# Patient Record
Sex: Male | Born: 1935 | Race: White | Hispanic: No
Health system: Southern US, Community
[De-identification: ages and names within clinical notes are randomized; demographics above are authoritative.]

## PROBLEM LIST (undated history)

## (undated) DIAGNOSIS — K219 Gastro-esophageal reflux disease without esophagitis: Secondary | ICD-10-CM

## (undated) DIAGNOSIS — E7800 Pure hypercholesterolemia, unspecified: Secondary | ICD-10-CM

## (undated) DIAGNOSIS — E039 Hypothyroidism, unspecified: Secondary | ICD-10-CM

## (undated) DIAGNOSIS — T4145XA Adverse effect of unspecified anesthetic, initial encounter: Secondary | ICD-10-CM

## (undated) DIAGNOSIS — G459 Transient cerebral ischemic attack, unspecified: Secondary | ICD-10-CM

## (undated) DIAGNOSIS — N400 Enlarged prostate without lower urinary tract symptoms: Secondary | ICD-10-CM

## (undated) DIAGNOSIS — N289 Disorder of kidney and ureter, unspecified: Secondary | ICD-10-CM

## (undated) DIAGNOSIS — T8859XA Other complications of anesthesia, initial encounter: Secondary | ICD-10-CM

## (undated) DIAGNOSIS — I10 Essential (primary) hypertension: Secondary | ICD-10-CM

## (undated) DIAGNOSIS — F329 Major depressive disorder, single episode, unspecified: Secondary | ICD-10-CM

## (undated) DIAGNOSIS — F0390 Unspecified dementia without behavioral disturbance: Secondary | ICD-10-CM

## (undated) DIAGNOSIS — M1990 Unspecified osteoarthritis, unspecified site: Secondary | ICD-10-CM

## (undated) DIAGNOSIS — F32A Depression, unspecified: Secondary | ICD-10-CM

## (undated) HISTORY — PX: ROTATOR CUFF REPAIR: SHX139

## (undated) HISTORY — PX: COLECTOMY: SHX59

## (undated) HISTORY — PX: ABDOMINAL EXPLORATION SURGERY: SHX538

## (undated) HISTORY — PX: APPENDECTOMY: SHX54

## (undated) HISTORY — PX: FEMUR FRACTURE SURGERY: SHX633

## (undated) HISTORY — PX: TONSILLECTOMY: SUR1361

## (undated) HISTORY — PX: FRACTURE SURGERY: SHX138

## (undated) HISTORY — PX: KNEE ARTHROSCOPY: SHX127

## (undated) HISTORY — PX: POSTERIOR LUMBAR FUSION: SHX6036

## (undated) HISTORY — PX: CATARACT EXTRACTION W/ INTRAOCULAR LENS  IMPLANT, BILATERAL: SHX1307

---

## 2004-05-23 DIAGNOSIS — IMO0002 Reserved for concepts with insufficient information to code with codable children: Secondary | ICD-10-CM

## 2008-07-10 DIAGNOSIS — M5137 Other intervertebral disc degeneration, lumbosacral region: Secondary | ICD-10-CM

## 2016-03-19 DIAGNOSIS — N183 Chronic kidney disease, stage 3 unspecified: Secondary | ICD-10-CM | POA: Insufficient documentation

## 2016-03-19 DIAGNOSIS — E785 Hyperlipidemia, unspecified: Secondary | ICD-10-CM | POA: Insufficient documentation

## 2017-06-08 DIAGNOSIS — E039 Hypothyroidism, unspecified: Secondary | ICD-10-CM | POA: Insufficient documentation

## 2017-10-15 ENCOUNTER — Encounter (HOSPITAL_COMMUNITY): Payer: Self-pay | Admitting: *Deleted

## 2017-10-15 ENCOUNTER — Emergency Department (HOSPITAL_COMMUNITY): Payer: Medicare Other

## 2017-10-15 ENCOUNTER — Inpatient Hospital Stay (HOSPITAL_COMMUNITY)
Admission: EM | Admit: 2017-10-15 | Discharge: 2017-10-22 | DRG: 368 | Disposition: A | Discharge status: Home Health | Payer: Medicare Other | Attending: Internal Medicine | Admitting: Internal Medicine

## 2017-10-15 DIAGNOSIS — J9690 Respiratory failure, unspecified, unspecified whether with hypoxia or hypercapnia: Secondary | ICD-10-CM

## 2017-10-15 DIAGNOSIS — K226 Gastro-esophageal laceration-hemorrhage syndrome: Secondary | ICD-10-CM | POA: Diagnosis not present

## 2017-10-15 DIAGNOSIS — Z9841 Cataract extraction status, right eye: Secondary | ICD-10-CM

## 2017-10-15 DIAGNOSIS — J9600 Acute respiratory failure, unspecified whether with hypoxia or hypercapnia: Secondary | ICD-10-CM | POA: Diagnosis present

## 2017-10-15 DIAGNOSIS — K317 Polyp of stomach and duodenum: Secondary | ICD-10-CM | POA: Diagnosis present

## 2017-10-15 DIAGNOSIS — Z9842 Cataract extraction status, left eye: Secondary | ICD-10-CM

## 2017-10-15 DIAGNOSIS — D500 Iron deficiency anemia secondary to blood loss (chronic): Secondary | ICD-10-CM | POA: Diagnosis not present

## 2017-10-15 DIAGNOSIS — N401 Enlarged prostate with lower urinary tract symptoms: Secondary | ICD-10-CM | POA: Diagnosis present

## 2017-10-15 DIAGNOSIS — K922 Gastrointestinal hemorrhage, unspecified: Secondary | ICD-10-CM

## 2017-10-15 DIAGNOSIS — R0789 Other chest pain: Secondary | ICD-10-CM | POA: Diagnosis not present

## 2017-10-15 DIAGNOSIS — K449 Diaphragmatic hernia without obstruction or gangrene: Secondary | ICD-10-CM | POA: Diagnosis present

## 2017-10-15 DIAGNOSIS — R066 Hiccough: Secondary | ICD-10-CM | POA: Diagnosis present

## 2017-10-15 DIAGNOSIS — Z888 Allergy status to other drugs, medicaments and biological substances status: Secondary | ICD-10-CM

## 2017-10-15 DIAGNOSIS — Z7982 Long term (current) use of aspirin: Secondary | ICD-10-CM

## 2017-10-15 DIAGNOSIS — K222 Esophageal obstruction: Secondary | ICD-10-CM | POA: Diagnosis present

## 2017-10-15 DIAGNOSIS — F0390 Unspecified dementia without behavioral disturbance: Secondary | ICD-10-CM | POA: Diagnosis present

## 2017-10-15 DIAGNOSIS — K219 Gastro-esophageal reflux disease without esophagitis: Secondary | ICD-10-CM | POA: Diagnosis present

## 2017-10-15 DIAGNOSIS — Z79899 Other long term (current) drug therapy: Secondary | ICD-10-CM

## 2017-10-15 DIAGNOSIS — Z01818 Encounter for other preprocedural examination: Secondary | ICD-10-CM

## 2017-10-15 DIAGNOSIS — K2211 Ulcer of esophagus with bleeding: Secondary | ICD-10-CM | POA: Diagnosis present

## 2017-10-15 DIAGNOSIS — R739 Hyperglycemia, unspecified: Secondary | ICD-10-CM | POA: Diagnosis present

## 2017-10-15 DIAGNOSIS — Z8673 Personal history of transient ischemic attack (TIA), and cerebral infarction without residual deficits: Secondary | ICD-10-CM

## 2017-10-15 DIAGNOSIS — Z87891 Personal history of nicotine dependence: Secondary | ICD-10-CM

## 2017-10-15 DIAGNOSIS — R338 Other retention of urine: Secondary | ICD-10-CM | POA: Diagnosis present

## 2017-10-15 DIAGNOSIS — K2210 Ulcer of esophagus without bleeding: Secondary | ICD-10-CM | POA: Diagnosis not present

## 2017-10-15 DIAGNOSIS — R32 Unspecified urinary incontinence: Secondary | ICD-10-CM | POA: Diagnosis present

## 2017-10-15 DIAGNOSIS — R1310 Dysphagia, unspecified: Secondary | ICD-10-CM

## 2017-10-15 DIAGNOSIS — I10 Essential (primary) hypertension: Secondary | ICD-10-CM | POA: Diagnosis present

## 2017-10-15 DIAGNOSIS — D72829 Elevated white blood cell count, unspecified: Secondary | ICD-10-CM | POA: Diagnosis present

## 2017-10-15 DIAGNOSIS — R001 Bradycardia, unspecified: Secondary | ICD-10-CM | POA: Diagnosis present

## 2017-10-15 DIAGNOSIS — Z961 Presence of intraocular lens: Secondary | ICD-10-CM | POA: Diagnosis present

## 2017-10-15 DIAGNOSIS — F329 Major depressive disorder, single episode, unspecified: Secondary | ICD-10-CM | POA: Diagnosis present

## 2017-10-15 DIAGNOSIS — D62 Acute posthemorrhagic anemia: Secondary | ICD-10-CM | POA: Diagnosis present

## 2017-10-15 DIAGNOSIS — E039 Hypothyroidism, unspecified: Secondary | ICD-10-CM | POA: Diagnosis present

## 2017-10-15 DIAGNOSIS — R079 Chest pain, unspecified: Secondary | ICD-10-CM

## 2017-10-15 DIAGNOSIS — Z79890 Hormone replacement therapy (postmenopausal): Secondary | ICD-10-CM

## 2017-10-15 HISTORY — DX: Other complications of anesthesia, initial encounter: T88.59XA

## 2017-10-15 HISTORY — DX: Adverse effect of unspecified anesthetic, initial encounter: T41.45XA

## 2017-10-15 HISTORY — DX: Gastro-esophageal reflux disease without esophagitis: K21.9

## 2017-10-15 HISTORY — DX: Depression, unspecified: F32.A

## 2017-10-15 HISTORY — DX: Transient cerebral ischemic attack, unspecified: G45.9

## 2017-10-15 HISTORY — DX: Disorder of kidney and ureter, unspecified: N28.9

## 2017-10-15 HISTORY — DX: Pure hypercholesterolemia, unspecified: E78.00

## 2017-10-15 HISTORY — DX: Unspecified osteoarthritis, unspecified site: M19.90

## 2017-10-15 HISTORY — DX: Benign prostatic hyperplasia without lower urinary tract symptoms: N40.0

## 2017-10-15 HISTORY — DX: Hypothyroidism, unspecified: E03.9

## 2017-10-15 HISTORY — DX: Essential (primary) hypertension: I10

## 2017-10-15 HISTORY — DX: Major depressive disorder, single episode, unspecified: F32.9

## 2017-10-15 HISTORY — DX: Unspecified dementia, unspecified severity, without behavioral disturbance, psychotic disturbance, mood disturbance, and anxiety: F03.90

## 2017-10-15 LAB — URINALYSIS, ROUTINE W REFLEX MICROSCOPIC
Bacteria, UA: NONE SEEN
Bilirubin Urine: NEGATIVE
GLUCOSE, UA: NEGATIVE mg/dL
HGB URINE DIPSTICK: NEGATIVE
KETONES UR: NEGATIVE mg/dL
Leukocytes, UA: NEGATIVE
NITRITE: NEGATIVE
PH: 5 (ref 5.0–8.0)
PROTEIN: 100 mg/dL — AB
Specific Gravity, Urine: 1.017 (ref 1.005–1.030)
Squamous Epithelial / LPF: NONE SEEN

## 2017-10-15 LAB — CBC
HCT: 38.9 % — ABNORMAL LOW (ref 39.0–52.0)
Hemoglobin: 13.6 g/dL (ref 13.0–17.0)
MCH: 30.8 pg (ref 26.0–34.0)
MCHC: 35 g/dL (ref 30.0–36.0)
MCV: 88 fL (ref 78.0–100.0)
PLATELETS: 366 10*3/uL (ref 150–400)
RBC: 4.42 MIL/uL (ref 4.22–5.81)
RDW: 13.3 % (ref 11.5–15.5)
WBC: 12.8 10*3/uL — AB (ref 4.0–10.5)

## 2017-10-15 LAB — BASIC METABOLIC PANEL
Anion gap: 6 (ref 5–15)
BUN: 17 mg/dL (ref 6–20)
CALCIUM: 9.3 mg/dL (ref 8.9–10.3)
CHLORIDE: 106 mmol/L (ref 101–111)
CO2: 26 mmol/L (ref 22–32)
CREATININE: 1.02 mg/dL (ref 0.61–1.24)
GFR calc Af Amer: >60 mL/min (ref >60)
GFR calc non Af Amer: >60 mL/min (ref >60)
Glucose, Bld: 107 mg/dL — ABNORMAL HIGH (ref 65–99)
Potassium: 3.7 mmol/L (ref 3.5–5.1)
SODIUM: 138 mmol/L (ref 135–145)

## 2017-10-15 LAB — HEPATIC FUNCTION PANEL
ALK PHOS: 119 U/L (ref 38–126)
ALT: 16 U/L — ABNORMAL LOW (ref 17–63)
AST: 29 U/L (ref 15–41)
Albumin: 3.8 g/dL (ref 3.5–5.0)
BILIRUBIN INDIRECT: 0.4 mg/dL (ref 0.3–0.9)
BILIRUBIN TOTAL: 0.5 mg/dL (ref 0.3–1.2)
Bilirubin, Direct: 0.1 mg/dL (ref 0.1–0.5)
Total Protein: 6.9 g/dL (ref 6.5–8.1)

## 2017-10-15 LAB — I-STAT TROPONIN, ED
Troponin i, poc: 0 ng/mL (ref 0.00–0.08)
Troponin i, poc: 0 ng/mL (ref 0.00–0.08)

## 2017-10-15 LAB — TROPONIN I: Troponin I: <0.03 ng/mL (ref <0.03)

## 2017-10-15 MED ORDER — ENOXAPARIN SODIUM 40 MG/0.4ML ~~LOC~~ SOLN: 40.0000 mg | SUBCUTANEOUS | Status: DC

## 2017-10-15 MED ORDER — ASPIRIN EC 81 MG PO TBEC: 81.0000 mg | DELAYED_RELEASE_TABLET | Freq: Every day | ORAL | Status: DC

## 2017-10-15 MED ORDER — PROCHLORPERAZINE EDISYLATE 5 MG/ML IJ SOLN
5.0000 mg | Freq: Once | INTRAMUSCULAR | Status: AC
  Administered 2017-10-15: 5 mg via INTRAVENOUS
  Filled 2017-10-15: qty 2

## 2017-10-15 MED ORDER — SUCRALFATE 1 GM/10ML PO SUSP
1.0000 g | Freq: Four times a day (QID) | ORAL | Status: DC
  Administered 2017-10-15 – 2017-10-22 (×17): 1 g via ORAL
  Filled 2017-10-15 (×22): qty 10

## 2017-10-15 MED ORDER — OXYBUTYNIN CHLORIDE 5 MG PO TABS
5.0000 mg | ORAL_TABLET | ORAL | Status: DC
  Administered 2017-10-18 – 2017-10-22 (×5): 5 mg via ORAL
  Filled 2017-10-15 (×6): qty 1

## 2017-10-15 MED ORDER — NITROGLYCERIN 0.4 MG SL SUBL
0.4000 mg | SUBLINGUAL_TABLET | SUBLINGUAL | Status: DC | PRN
  Administered 2017-10-15 (×2): 0.4 mg via SUBLINGUAL
  Filled 2017-10-15: qty 1

## 2017-10-15 MED ORDER — ASPIRIN 81 MG PO CHEW
324.0000 mg | CHEWABLE_TABLET | Freq: Once | ORAL | Status: AC
  Administered 2017-10-15: 324 mg via ORAL
  Filled 2017-10-15: qty 4

## 2017-10-15 MED ORDER — SERTRALINE HCL 50 MG PO TABS
50.0000 mg | ORAL_TABLET | ORAL | Status: DC
  Administered 2017-10-18: 50 mg via ORAL
  Filled 2017-10-15: qty 1

## 2017-10-15 MED ORDER — ALUM & MAG HYDROXIDE-SIMETH 200-200-20 MG/5ML PO SUSP
30.0000 mL | Freq: Four times a day (QID) | ORAL | Status: DC | PRN
  Administered 2017-10-15: 30 mL via ORAL
  Filled 2017-10-15: qty 30

## 2017-10-15 MED ORDER — LEVOTHYROXINE SODIUM 25 MCG PO TABS: 25.0000 ug | ORAL_TABLET | ORAL | Status: DC

## 2017-10-15 MED ORDER — ACETAMINOPHEN 650 MG RE SUPP: 650.0000 mg | Freq: Four times a day (QID) | RECTAL | Status: DC | PRN

## 2017-10-15 MED ORDER — GI COCKTAIL ~~LOC~~
30.0000 mL | Freq: Three times a day (TID) | ORAL | Status: DC | PRN
  Administered 2017-10-15: 30 mL via ORAL
  Filled 2017-10-15: qty 30

## 2017-10-15 MED ORDER — ACETAMINOPHEN 325 MG PO TABS
650.0000 mg | ORAL_TABLET | Freq: Four times a day (QID) | ORAL | Status: DC | PRN
  Administered 2017-10-19 – 2017-10-21 (×2): 650 mg via ORAL
  Filled 2017-10-15 (×3): qty 2

## 2017-10-15 MED ORDER — ONDANSETRON HCL 4 MG/2ML IJ SOLN
4.0000 mg | Freq: Once | INTRAMUSCULAR | Status: AC
  Administered 2017-10-15: 4 mg via INTRAVENOUS
  Filled 2017-10-15: qty 2

## 2017-10-15 MED ORDER — PANTOPRAZOLE SODIUM 40 MG PO TBEC
40.0000 mg | DELAYED_RELEASE_TABLET | Freq: Every day | ORAL | Status: DC
  Administered 2017-10-15: 40 mg via ORAL
  Filled 2017-10-15: qty 1

## 2017-10-15 NOTE — Progress Notes (Signed)
Patient was admitted from ED around 1830H, throwing up blood about 50 cc with small clots. Called and notified the on call doctor. Doctors S.Lacroce and  Slavina at the bedside to check the patient now.

## 2017-10-15 NOTE — H&P (Signed)
Date: 10/15/2017               Patient Name:  Anthony Hayes MRN: [id number]  DOB: February 03, 1935 Age / Sex: 82 y.o., male   PCP: Allie Dimmer, MD         Medical Service: Internal Medicine Teaching Service         Attending Physician: Dr. Lucious Groves, DO    First Contact: Dr. Aggie Hacker Pager: [phone]  Second Contact: Dr. Jari Favre Pager: [phone]       After Hours (After 5p/  First Contact Pager: [phone]  weekends / holidays): Second Contact Pager: [phone]   Chief Complaint: chest pain   History of Present Illness:  82 yo male PMHx of HTN, hypothyroidism, and GERD presenting with the chief complaint of chest pain. The patient states that this morning around 9AM he was eating breakfast and within minutes of finishing he started to feel unwell. He states the chest pain is located substernal, is burning and pressure like in quality, and radiates down toward his abdomen. It does not radiate to his neck, arms, or back. He states that this pain is different than his typical reflux pain, but has had pain like this in the past. He does not know how long the pain lasts when it occurs. He denies it being associated with exertion. The pain was not relieved by nitroglycerin. He does not know if anything makes the pain better. He also had nausea and vomiting this morning with three episodes of non-bilious, non-bloody emesis. He has not had any more episodes of vomiting. He denies associated abdominal pain. Denies recent infections. Denies fevers, chills, shortness of breath, cough, dysuria, diarrhea, or hematuria.  The patient had cardiac work up in April 2017. Lexiscan stress test showed a moderate sized, moderate intensity,fixed inferior defect consistent with scar, inferior segment is hypokinesis. Estimated EF of 45%. No ischemia was detected. EKG at rest and with stress showed normal sinus rhythm; left anterior fascicular block,right bundle branch block, and inferior T wave inversions.  In the ED,  the patient was afebrile, hypertensive BP 175/74, HR in 50s, saturating 98% on RA. BMET was unremarkable, CBC with mildly elevated WBC at 12.8. I-stat troponin was negative. Patient was given ASA 324 g and nitroglycerin that did not relieve his pain, as well as Zofran.   Meds:  Current Meds  Medication Sig  . aspirin EC 81 MG tablet Take 81 mg by mouth daily.  Marland Kitchen imipramine (TOFRANIL) 25 MG tablet Take 25 mg by mouth at bedtime.  Marland Kitchen levothyroxine (SYNTHROID, LEVOTHROID) 50 MCG tablet Take 25 mcg by mouth every morning.   Marland Kitchen oxybutynin (DITROPAN) 5 MG tablet Take 5 mg by mouth every morning.  . ranitidine (ZANTAC) 150 MG tablet Take 150 mg by mouth at bedtime.  . sertraline (ZOLOFT) 50 MG tablet Take 50 mg by mouth every morning.     Allergies: Allergies as of 10/15/2017  . (No Known Allergies)   Past Medical History:  Diagnosis Date  . Benign prostatic hyperplasia   . Dementia   . Depression   . GERD (gastroesophageal reflux disease)   . Hypertension   . Renal disease   . Renal disorder   . TIA (transient ischemic attack)     Family History:  Denies cardiac history in parents, but is unsure.   Social History:  Denies smoking, occasional alcohol use, no illicit drug use.   Review of Systems: A complete ROS was negative except as per  HPI.    Physical Exam: Blood pressure (!) 143/83, pulse (!) 57, temperature 98.6 F (37 C), temperature source Oral, resp. rate 12, SpO2 97 %.   General: Uncomfortable, diaphoretic, intermittent grimacing with CP, hiccups HEENT: /AT, EOMI, no scleral icterus, PERRL Cardiac: Bradycardia, regular rhythm, No R/M/G appreciated, No chest wall tenderness Pulm: normal effort, CTAB Abd: soft, non tender, distended, BS normal Ext: extremities well perfused, no peripheral edema, dorsalis pedis pulses 2+ bilaterally Neuro: alert and oriented X3, cranial nerves II-XII grossly intact, strength 5/5 bilaterally in upper and lower extremities   EKG:  personally reviewed my interpretation is sinus bradycardia, T wave inversions in II, III, V3-V6, prolonged QT, LVH  CXR: personally reviewed my interpretation is no active cardiopulmonary disease; no pulmonary edema or infiltrate  Assessment & Plan by Problem: Active Problems:   Chest pain Patient's chest pain seems atypical. Radiating toward the abdomen, not relieved by nitroglycerin, non exertional, fleeting pains lasting for a few seconds or less. I stat troponin negative. EKG with T wave inversions concerning for acute ischemia, but prior EKG report on Care Everywhere indicates T wave inversions present in inferior leads in April 2017. Heart score of 4. Recent cardiology work up in April 2017 reassuring. CP may be related to GERD as patient was actively hiccupping during examination.  -Trend Troponin -Repeat EKG in AM -Cardiac Monitoring -GI cocktail, Protonix 40 mg daily -SL nitro q 5 minutes PRN -GI consult in AM   Hypertension Currently hypertensive, most likely in the setting of pain. -Continue amlodipine 10 mg daily  Leukocytosis Patient is afebrile. Has history of cystitis and urinary retention per chart review. Denies dysuria or hematuria. No other signs of infection. Will check UA. -F/u UA  Urinary Retention -Continue Oxybutynin 5 mg  Dispo: Admit patient to Observation with expected length of stay less than 2 midnights.  Signed: Melanee Spry, MD 10/15/2017, 1:59 PM  Pager: [phone]

## 2017-10-15 NOTE — Consult Note (Signed)
Cardiology Consultation:   Patient ID: Anthony Hayes; [id number]; November 03, 1935   Admit date: 10/15/2017 Date of Consult: 10/15/2017  Primary Care Provider: Allie Dimmer, MD Primary Cardiologist: none   Patient Profile:   Anthony Hayes is a 82 y.o. male with a hx of noncardiac chest pain who is being seen today for the evaluation of chest pain at the request of Dr Aggie Hacker.  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  He has developed worsening memory and has relocated to an assisted living facility to be closer to his daughter who lives in [place].  The patient is admitted with chest pain and cardiology consultation is requested to help with evaluation.  The patient developed pain in the center of his chest in his epigastrium this morning shortly after eating breakfast.  He has had the hiccups ever since.  He has felt quite uncomfortable.  The pain is nonradiating.  The pain has not been related to physical exertion.  He has been having this discomfort all day long.  He also has had nausea and a few episodes of vomiting.  He has a past history of what is described as "noncardiac chest pain."  He had a nuclear stress test in 2017 that demonstrated a fixed inferior defect without ischemia.  He also has a reported history of left anterior fascicular block, right bundle branch block, and T wave abnormality reported on outside EKGs.  The patient has been given a GI cocktail, Protonix, and nitroglycerin.  None of these interventions have improved his symptoms.   Past Medical History:  Diagnosis Date  . Benign prostatic hyperplasia   . Dementia   . Depression   . GERD (gastroesophageal reflux disease)   . Hypertension   . Renal disease   . Renal disorder   . TIA (transient ischemic attack)     History reviewed. No pertinent surgical history.   Home Medications:  Prior to Admission medications   Medication Sig Start Date End Date  Taking? Authorizing Provider  aspirin EC 81 MG tablet Take 81 mg by mouth daily.   Yes [provider]  imipramine (TOFRANIL) 25 MG tablet Take 25 mg by mouth at bedtime. 10/08/17  Yes [provider]  levothyroxine (SYNTHROID, LEVOTHROID) 50 MCG tablet Take 25 mcg by mouth every morning.  10/01/17  Yes [provider]  oxybutynin (DITROPAN) 5 MG tablet Take 5 mg by mouth every morning. 10/05/17  Yes [provider]  ranitidine (ZANTAC) 150 MG tablet Take 150 mg by mouth at bedtime. 09/28/17  Yes [provider]  sertraline (ZOLOFT) 50 MG tablet Take 50 mg by mouth every morning. 09/21/17  Yes [provider]    Inpatient Medications: Scheduled Meds: . [START ON 10/16/2017] aspirin EC  81 mg Oral Daily  . enoxaparin (LOVENOX) injection  40 mg Subcutaneous Q24H  . [START ON 10/16/2017] levothyroxine  25 mcg Oral BH-q7a  . [START ON 10/16/2017] oxybutynin  5 mg Oral BH-q7a  . pantoprazole  40 mg Oral Daily  . [START ON 10/16/2017] sertraline  50 mg Oral BH-q7a   Continuous Infusions:  PRN Meds: acetaminophen **OR** acetaminophen, alum & mag hydroxide-simeth, nitroGLYCERIN  Allergies:   No Known Allergies  Social History:   Social History   Social History  . Marital status: Widowed    Spouse name: N/A  . Number of children: N/A  . Years of education: N/A   Occupational History  . Not on file.  Social History Main Topics  . Smoking status: Never Smoker  . Smokeless tobacco: Not on file  . Alcohol use Yes     Comment: occasional  . Drug use: No  . Sexual activity: Not on file   Other Topics Concern  . Not on file   Social History Narrative  . No narrative on file    Family History:   History reviewed. No pertinent family history. No premature CAD in the family.  ROS:  Please see the history of present illness.  ROS  Memory loss, otherwise all other ROS reviewed and negative.     Physical Exam/Data:   Vitals:    10/15/17 1443 10/15/17 1445 10/15/17 1545 10/15/17 1730  BP: (!) 185/92 (!) 175/90 (!) 167/101 (!) 196/94  Pulse: 60 (!) 59 (!) 56 60  Resp:  14 15 15   Temp:      TempSrc:      SpO2: 95% 96% 90% 95%   No intake or output data in the 24 hours ending 10/15/17 1800 There were no vitals filed for this visit. There is no height or weight on file to calculate BMI.  General:  Well nourished, well developed, pleasant elderly male in no acute distress HEENT: normal Lymph: no adenopathy Neck: no JVD Endocrine:  No thryomegaly Vascular: No carotid bruits; FA pulses 2+ bilaterally Cardiac:  normal S1, S2; RRR; 2/6 SEM at the RUSB  Lungs:  clear to auscultation bilaterally, no wheezing, rhonchi or rales  Abd: soft, nontender, no hepatomegaly  Ext: no edema Musculoskeletal:  No deformities, BUE and BLE strength normal and equal Skin: warm and dry  Neuro:  CNs 2-12 intact, no focal abnormalities noted Psych:  Normal affect   EKG:  The EKG was personally reviewed and demonstrates:  NSR, right bundle branch block, left anterior fascicular block, diffuse T wave abnormality consider inferolateral ischemia  Telemetry:  Telemetry was personally reviewed and demonstrates: Normal sinus rhythm  Relevant CV Studies: Myoview stress test April 03, 2016: IMPRESSION: SPECT imaging shows a moderate sized, moderate intensity,fixed inferior  defect consistent with scar. Inferior segment is hypokinetic. EF 45%. No ischemia detected. No transient ischemic dilatation detected. No extra cardiac tracer uptake noted.  Echocardiogram March 20, 2016: Interpretation Summary The left ventricular ejection fraction is normal. Grade I diastolic dysfunction, (abnormal relaxation pattern). There is mild concentric left ventricular hypertrophy. Aortic valve is scloritic bwith no signifcant stenosis There is mild tricuspid regurgitation.  Laboratory Data:  Chemistry Recent Labs Lab 10/15/17 1136  NA 138  K 3.7   CL 106  CO2 26  GLUCOSE 107*  BUN 17  CREATININE 1.02  CALCIUM 9.3  GFRNONAA >60  GFRAA >60  ANIONGAP 6     Recent Labs Lab 10/15/17 1136  PROT 6.9  ALBUMIN 3.8  AST 29  ALT 16*  ALKPHOS 119  BILITOT 0.5   Hematology Recent Labs Lab 10/15/17 1136  WBC 12.8*  RBC 4.42  HGB 13.6  HCT 38.9*  MCV 88.0  MCH 30.8  MCHC 35.0  RDW 13.3  PLT 366   Cardiac EnzymesNo results for input(s): TROPONINI in the last 168 hours.  Recent Labs Lab 10/15/17 1148 10/15/17 1659  TROPIPOC 0.00 0.00    BNPNo results for input(s): BNP, PROBNP in the last 168 hours.  DDimer No results for input(s): DDIMER in the last 168 hours.  Radiology/Studies:  Dg Chest 2 View  Result Date: 10/15/2017 CLINICAL DATA:  Shortness of breath and midsternal chest pain EXAM: CHEST  2 VIEW COMPARISON:  None. FINDINGS: The lungs are clear. Heart is borderline enlarged with pulmonary vascularity within normal limits. No adenopathy. Bones are somewhat osteoporotic. No focal bone lesions are evident. IMPRESSION: Heart borderline enlarged. No edema or consolidation. Bones appear osteoporotic. Electronically Signed   By: Lowella Grip III M.D.   On: 10/15/2017 10:59    Assessment and Plan:   Elderly 82 year old gentleman with chest pain syndrome and associated hiccups.  The patient appears quite uncomfortable with pain located in the epigastrium and lower chest.  His abdominal exam is fairly benign.  His EKG changes do not appear to be acute based on review of outside records.  Serial troponins are negative.  Symptoms are not typical for cardiac pain.  He underwent evaluation in 2017 when he presented with chest pain and this is reviewed today.  His echocardiogram showed normal LV systolic function with aortic sclerosis.  A stress Myoview scan showed a fixed inferior defect with no ischemia.  It seems most likely that the patient has a GI source of his chest discomfort.  Will defer further evaluation to the  primary service. We will follow up on his cardiac markers once they are all completed, but if his serial enzymes remain negative, I would not pursue further inpatient cardiac evaluation.  Recommendations discussed with the patient, his daughter, and the Internal Medicine Service.   For questions or updates, please contact [place] Please consult [email or web address] for contact info under Cardiology/STEMI.   Deatra James, MD  10/15/2017 6:00 PM

## 2017-10-15 NOTE — ED Notes (Signed)
Admitting at bedside 

## 2017-10-15 NOTE — ED Triage Notes (Signed)
Pt reports being at assisted living this am and had onset of mid chest discomfort with n/v.

## 2017-10-15 NOTE — Care Management Obs Status (Signed)
[facility] NOTIFICATION   Patient Details  Name: Orren Pietsch MRN: [id number] Date of Birth: 03/30/1935   Medicare Observation Status Notification Given:  Yes    Carles Collet, RN 10/15/2017, 4:48 PM

## 2017-10-15 NOTE — ED Notes (Signed)
Cardiology at bedside to eval pt

## 2017-10-15 NOTE — ED Provider Notes (Signed)
[facility] Provider Note   CSN: [id number] Arrival date & time: 10/15/17  1015     History   Chief Complaint Chief Complaint  Patient presents with  . Chest Pain  . Abdominal Pain    HPI Anthony Hayes is a 82 y.o. male.  HPI  Developed chest pain this morning while siting down, at [place] Was in center of chest without radiation, can't remember what level the pain was, tightness Had CP up until EMS picking him up Had nausea, vomited 3 times No dyspnea, no lightheadedness, no leg pain, no abdominal pain, no numbness/weakness Had similar CP 1 month ago  No hx of known CAD Has htn, hx of TIA No cigarettes  A few days ago after dinner had heart burn Lives at [facility] Has low heart rate at baseline  Past Medical History:  Diagnosis Date  . Benign prostatic hyperplasia   . Dementia   . Depression   . GERD (gastroesophageal reflux disease)   . Hypertension   . Renal disease   . Renal disorder   . TIA (transient ischemic attack)     Patient Active Problem List   Diagnosis Date Noted  . Chest pain 10/15/2017    History reviewed. No pertinent surgical history.     Home Medications    Prior to Admission medications   Medication Sig Start Date End Date Taking? Authorizing Provider  aspirin EC 81 MG tablet Take 81 mg by mouth daily.   Yes [provider]  imipramine (TOFRANIL) 25 MG tablet Take 25 mg by mouth at bedtime. 10/08/17  Yes [provider]  levothyroxine (SYNTHROID, LEVOTHROID) 50 MCG tablet Take 25 mcg by mouth every morning.  10/01/17  Yes [provider]  oxybutynin (DITROPAN) 5 MG tablet Take 5 mg by mouth every morning. 10/05/17  Yes [provider]  ranitidine (ZANTAC) 150 MG tablet Take 150 mg by mouth at bedtime. 09/28/17  Yes [provider]  sertraline (ZOLOFT) 50 MG tablet Take 50 mg by mouth every morning. 09/21/17  Yes [provider]    Family  History History reviewed. No pertinent family history.  Social History Social History  Substance Use Topics  . Smoking status: Never Smoker  . Smokeless tobacco: Not on file  . Alcohol use Yes     Comment: occasional     Allergies   Patient has no known allergies.   Review of Systems Review of Systems  Constitutional: Negative for fever.  HENT: Negative for sore throat.   Eyes: Negative for visual disturbance.  Respiratory: Negative for cough and shortness of breath.   Cardiovascular: Positive for chest pain.  Gastrointestinal: Positive for nausea and vomiting. Negative for abdominal pain.  Genitourinary: Negative for difficulty urinating and dysuria.  Musculoskeletal: Negative for back pain and neck stiffness.  Skin: Negative for rash.  Neurological: Negative for syncope, weakness, light-headedness and headaches.     Physical Exam Updated Vital Signs BP (!) 197/100   Pulse (!) 59   Temp 98.6 F (37 C) (Oral)   Resp 16   SpO2 96%   Physical Exam  Constitutional: He is oriented to person, place, and time. He appears well-developed and well-nourished. No distress.  HENT:  Head: Normocephalic and atraumatic.  Eyes: Conjunctivae and EOM are normal.  Neck: Normal range of motion.  Cardiovascular: Normal rate, regular rhythm, normal heart sounds and intact distal pulses.  Exam reveals no gallop and no friction rub.   No  murmur heard. Pulmonary/Chest: Effort normal and breath sounds normal. No respiratory distress. He has no wheezes. He has no rales.  Abdominal: Soft. He exhibits no distension. There is no tenderness. There is no guarding.  Musculoskeletal: He exhibits no edema.  Neurological: He is alert and oriented to person, place, and time.  Skin: Skin is warm and dry. He is not diaphoretic.  Nursing note and vitals reviewed.    ED Treatments / Results  Labs (all labs ordered are listed, but only abnormal results are displayed) Labs Reviewed  BASIC  METABOLIC PANEL - Abnormal; Notable for the following:       Result Value   Glucose, Bld 107 (*)    All other components within normal limits  CBC - Abnormal; Notable for the following:    WBC 12.8 (*)    HCT 38.9 (*)    All other components within normal limits  HEPATIC FUNCTION PANEL - Abnormal; Notable for the following:    ALT 16 (*)    All other components within normal limits  URINALYSIS, ROUTINE W REFLEX MICROSCOPIC - Abnormal; Notable for the following:    Protein, ur 100 (*)    All other components within normal limits  TROPONIN I  TROPONIN I  TROPONIN I  TROPONIN I  CBC  BASIC METABOLIC PANEL  I-STAT TROPONIN, ED  I-STAT TROPONIN, ED    EKG  EKG Interpretation  Date/Time:  Friday October 15 2017 11:06:14 EDT Ventricular Rate:  50 PR Interval:    QRS Duration: 157 QT Interval:  569 QTC Calculation: 519 R Axis:   -82 Text Interpretation:  Sinus rhythm Right bundle branch block LVH with IVCD and secondary repol abnrm Prolonged QT interval Diffuse TW abnormalities No previous ECGs available Confirmed by Gareth Morgan [phone]) on 10/15/2017 11:20:05 AM       Radiology Dg Chest 2 View  Result Date: 10/15/2017 CLINICAL DATA:  Shortness of breath and midsternal chest pain EXAM: CHEST  2 VIEW COMPARISON:  None. FINDINGS: The lungs are clear. Heart is borderline enlarged with pulmonary vascularity within normal limits. No adenopathy. Bones are somewhat osteoporotic. No focal bone lesions are evident. IMPRESSION: Heart borderline enlarged. No edema or consolidation. Bones appear osteoporotic. Electronically Signed   By: Lowella Grip III M.D.   On: 10/15/2017 10:59    Procedures Procedures (including critical care time)  Medications Ordered in ED Medications  nitroGLYCERIN (NITROSTAT) SL tablet 0.4 mg (0.4 mg Sublingual Given 10/15/17 1228)  aspirin EC tablet 81 mg (not administered)  sertraline (ZOLOFT) tablet 50 mg (not administered)  levothyroxine  (SYNTHROID, LEVOTHROID) tablet 25 mcg (not administered)  oxybutynin (DITROPAN) tablet 5 mg (not administered)  enoxaparin (LOVENOX) injection 40 mg (not administered)  acetaminophen (TYLENOL) tablet 650 mg (not administered)    Or  acetaminophen (TYLENOL) suppository 650 mg (not administered)  pantoprazole (PROTONIX) EC tablet 40 mg (40 mg Oral Given 10/15/17 1653)  alum & mag hydroxide-simeth (MAALOX/MYLANTA) 200-200-20 MG/5ML suspension 30 mL (30 mLs Oral Given 10/15/17 1806)  aspirin chewable tablet 324 mg (324 mg Oral Given 10/15/17 1143)  ondansetron (ZOFRAN) injection 4 mg (4 mg Intravenous Given 10/15/17 1140)     Initial Impression / Assessment and Plan / ED Course  I have reviewed the triage vital signs and the nursing notes.  Pertinent labs & imaging results that were available during my care of the patient were reviewed by me and considered in my medical decision making (see chart for details).     82 year old male  with history of hypertension, TIA presents with concern for chest pain beginning this morning. EKG without comparison to prior, shows diffuse T-wave abnormalities. Chest x-ray without acute abnormalities, no mediastinal widening, no pneumothorax. The patient describes more as a tightness and dull achy pain, no radiation to the back, has normal upper and lower extremity pulses, and have low suspicion for aortic dissection. He denies any dyspnea, has normal oxygenation, low suspicion for pulmonary embolus. His abdominal exam is benign, without right upper quadrant tenderness, doubt intra-abdominal etiology of pain. Initial troponin is negative.  Concern for possible cardiac etiology. We'll admit to internal medicine for chest pain evaluation. Patient reported chest pain improved after he received nitroglycerin, however he reports he is not sure if nitroglycerine helped him.  Admitted to IM for continued care.   Final Clinical Impressions(s) / ED Diagnoses   Final  diagnoses:  Dysphagia  Chest pain, unspecified type    New Prescriptions Current Discharge Medication List       Gareth Morgan, MD 10/15/17 1836

## 2017-10-15 NOTE — ED Notes (Signed)
Trying to get out of bed. Help to stand at bedside to try to void. Unable to void.  C/o sharp pain when he swallows.  Position back up in bed.  State I am going throw up. Vomited food product with a reddish tent.  Report to his primary nurse.

## 2017-10-15 NOTE — ED Notes (Signed)
Admitting md is aware of elevated bp.

## 2017-10-16 ENCOUNTER — Observation Stay (HOSPITAL_COMMUNITY): Payer: Medicare Other | Admitting: Anesthesiology

## 2017-10-16 ENCOUNTER — Observation Stay (HOSPITAL_COMMUNITY): Payer: Medicare Other

## 2017-10-16 ENCOUNTER — Encounter (HOSPITAL_COMMUNITY)
Admission: EM | Disposition: A | Discharge status: Home Health | Payer: Self-pay | Source: Home / Self Care | Attending: Internal Medicine

## 2017-10-16 ENCOUNTER — Inpatient Hospital Stay (HOSPITAL_COMMUNITY): Payer: Medicare Other

## 2017-10-16 ENCOUNTER — Encounter (HOSPITAL_COMMUNITY): Payer: Self-pay

## 2017-10-16 DIAGNOSIS — Z7982 Long term (current) use of aspirin: Secondary | ICD-10-CM | POA: Diagnosis not present

## 2017-10-16 DIAGNOSIS — R1310 Dysphagia, unspecified: Secondary | ICD-10-CM

## 2017-10-16 DIAGNOSIS — K219 Gastro-esophageal reflux disease without esophagitis: Secondary | ICD-10-CM | POA: Diagnosis present

## 2017-10-16 DIAGNOSIS — Z01818 Encounter for other preprocedural examination: Secondary | ICD-10-CM

## 2017-10-16 DIAGNOSIS — D72829 Elevated white blood cell count, unspecified: Secondary | ICD-10-CM | POA: Diagnosis present

## 2017-10-16 DIAGNOSIS — K920 Hematemesis: Secondary | ICD-10-CM

## 2017-10-16 DIAGNOSIS — Z9842 Cataract extraction status, left eye: Secondary | ICD-10-CM | POA: Diagnosis not present

## 2017-10-16 DIAGNOSIS — K2210 Ulcer of esophagus without bleeding: Secondary | ICD-10-CM | POA: Diagnosis present

## 2017-10-16 DIAGNOSIS — K210 Gastro-esophageal reflux disease with esophagitis: Secondary | ICD-10-CM | POA: Diagnosis not present

## 2017-10-16 DIAGNOSIS — E039 Hypothyroidism, unspecified: Secondary | ICD-10-CM | POA: Diagnosis present

## 2017-10-16 DIAGNOSIS — I10 Essential (primary) hypertension: Secondary | ICD-10-CM | POA: Diagnosis present

## 2017-10-16 DIAGNOSIS — K228 Other specified diseases of esophagus: Secondary | ICD-10-CM

## 2017-10-16 DIAGNOSIS — R739 Hyperglycemia, unspecified: Secondary | ICD-10-CM | POA: Diagnosis present

## 2017-10-16 DIAGNOSIS — J9600 Acute respiratory failure, unspecified whether with hypoxia or hypercapnia: Secondary | ICD-10-CM | POA: Diagnosis present

## 2017-10-16 DIAGNOSIS — Z9841 Cataract extraction status, right eye: Secondary | ICD-10-CM | POA: Diagnosis not present

## 2017-10-16 DIAGNOSIS — Z961 Presence of intraocular lens: Secondary | ICD-10-CM | POA: Diagnosis present

## 2017-10-16 DIAGNOSIS — R933 Abnormal findings on diagnostic imaging of other parts of digestive tract: Secondary | ICD-10-CM

## 2017-10-16 DIAGNOSIS — Z8673 Personal history of transient ischemic attack (TIA), and cerebral infarction without residual deficits: Secondary | ICD-10-CM | POA: Diagnosis not present

## 2017-10-16 DIAGNOSIS — R0789 Other chest pain: Secondary | ICD-10-CM | POA: Diagnosis not present

## 2017-10-16 DIAGNOSIS — J9690 Respiratory failure, unspecified, unspecified whether with hypoxia or hypercapnia: Secondary | ICD-10-CM

## 2017-10-16 DIAGNOSIS — R079 Chest pain, unspecified: Secondary | ICD-10-CM | POA: Diagnosis not present

## 2017-10-16 DIAGNOSIS — Z79899 Other long term (current) drug therapy: Secondary | ICD-10-CM | POA: Diagnosis not present

## 2017-10-16 DIAGNOSIS — R1013 Epigastric pain: Secondary | ICD-10-CM | POA: Diagnosis not present

## 2017-10-16 DIAGNOSIS — J9601 Acute respiratory failure with hypoxia: Secondary | ICD-10-CM | POA: Diagnosis not present

## 2017-10-16 DIAGNOSIS — N401 Enlarged prostate with lower urinary tract symptoms: Secondary | ICD-10-CM | POA: Diagnosis present

## 2017-10-16 DIAGNOSIS — D62 Acute posthemorrhagic anemia: Secondary | ICD-10-CM | POA: Diagnosis present

## 2017-10-16 DIAGNOSIS — R066 Hiccough: Secondary | ICD-10-CM | POA: Diagnosis present

## 2017-10-16 DIAGNOSIS — Z79890 Hormone replacement therapy (postmenopausal): Secondary | ICD-10-CM | POA: Diagnosis not present

## 2017-10-16 DIAGNOSIS — Z888 Allergy status to other drugs, medicaments and biological substances status: Secondary | ICD-10-CM | POA: Diagnosis not present

## 2017-10-16 DIAGNOSIS — F329 Major depressive disorder, single episode, unspecified: Secondary | ICD-10-CM | POA: Diagnosis present

## 2017-10-16 DIAGNOSIS — E876 Hypokalemia: Secondary | ICD-10-CM | POA: Diagnosis not present

## 2017-10-16 DIAGNOSIS — K922 Gastrointestinal hemorrhage, unspecified: Secondary | ICD-10-CM | POA: Diagnosis present

## 2017-10-16 DIAGNOSIS — K226 Gastro-esophageal laceration-hemorrhage syndrome: Secondary | ICD-10-CM | POA: Diagnosis present

## 2017-10-16 DIAGNOSIS — R338 Other retention of urine: Secondary | ICD-10-CM | POA: Diagnosis present

## 2017-10-16 DIAGNOSIS — F0390 Unspecified dementia without behavioral disturbance: Secondary | ICD-10-CM | POA: Diagnosis not present

## 2017-10-16 DIAGNOSIS — K222 Esophageal obstruction: Secondary | ICD-10-CM | POA: Diagnosis present

## 2017-10-16 DIAGNOSIS — Z87891 Personal history of nicotine dependence: Secondary | ICD-10-CM | POA: Diagnosis not present

## 2017-10-16 DIAGNOSIS — R001 Bradycardia, unspecified: Secondary | ICD-10-CM | POA: Diagnosis present

## 2017-10-16 HISTORY — PX: ESOPHAGOGASTRODUODENOSCOPY (EGD) WITH PROPOFOL: SHX5813

## 2017-10-16 LAB — HIV ANTIBODY (ROUTINE TESTING W REFLEX): HIV Screen 4th Generation wRfx: NONREACTIVE

## 2017-10-16 LAB — MRSA PCR SCREENING: MRSA by PCR: NEGATIVE

## 2017-10-16 LAB — POCT I-STAT 3, ART BLOOD GAS (G3+)
Acid-base deficit: 2 mmol/L (ref 0.0–2.0)
Bicarbonate: 22.2 mmol/L (ref 20.0–28.0)
O2 Saturation: 99 %
Patient temperature: 98.6
TCO2: 23 mmol/L (ref 22–32)
pCO2 arterial: 34.8 mmHg (ref 32.0–48.0)
pH, Arterial: 7.414 (ref 7.350–7.450)
pO2, Arterial: 115 mmHg — ABNORMAL HIGH (ref 83.0–108.0)

## 2017-10-16 LAB — BASIC METABOLIC PANEL
Anion gap: 11 (ref 5–15)
BUN: 28 mg/dL — AB (ref 6–20)
CHLORIDE: 104 mmol/L (ref 101–111)
CO2: 23 mmol/L (ref 22–32)
CREATININE: 1 mg/dL (ref 0.61–1.24)
Calcium: 9.4 mg/dL (ref 8.9–10.3)
GFR calc Af Amer: >60 mL/min (ref >60)
GFR calc non Af Amer: >60 mL/min (ref >60)
GLUCOSE: 167 mg/dL — AB (ref 65–99)
Potassium: 4 mmol/L (ref 3.5–5.1)
Sodium: 138 mmol/L (ref 135–145)

## 2017-10-16 LAB — CBC
HCT: 38.9 % — ABNORMAL LOW (ref 39.0–52.0)
HCT: 35.4 % — ABNORMAL LOW (ref 39.0–52.0)
Hemoglobin: 13.5 g/dL (ref 13.0–17.0)
Hemoglobin: 12 g/dL — ABNORMAL LOW (ref 13.0–17.0)
MCH: 30.8 pg (ref 26.0–34.0)
MCH: 30 pg (ref 26.0–34.0)
MCHC: 34.7 g/dL (ref 30.0–36.0)
MCHC: 33.9 g/dL (ref 30.0–36.0)
MCV: 88.6 fL (ref 78.0–100.0)
MCV: 88.5 fL (ref 78.0–100.0)
PLATELETS: 427 10*3/uL — AB (ref 150–400)
PLATELETS: 399 10*3/uL (ref 150–400)
RBC: 4.39 MIL/uL (ref 4.22–5.81)
RBC: 4 MIL/uL — AB (ref 4.22–5.81)
RDW: 13.4 % (ref 11.5–15.5)
RDW: 13.7 % (ref 11.5–15.5)
WBC: 16.2 10*3/uL — ABNORMAL HIGH (ref 4.0–10.5)
WBC: 14.9 10*3/uL — ABNORMAL HIGH (ref 4.0–10.5)

## 2017-10-16 LAB — TYPE AND SCREEN
ABO/RH(D): O POS
ANTIBODY SCREEN: NEGATIVE

## 2017-10-16 LAB — MAGNESIUM: Magnesium: 2.1 mg/dL (ref 1.7–2.4)

## 2017-10-16 LAB — GLUCOSE, CAPILLARY
GLUCOSE-CAPILLARY: 110 mg/dL — AB (ref 65–99)
Glucose-Capillary: 113 mg/dL — ABNORMAL HIGH (ref 65–99)

## 2017-10-16 LAB — ABO/RH: ABO/RH(D): O POS

## 2017-10-16 LAB — TRIGLYCERIDES: TRIGLYCERIDES: 130 mg/dL (ref <150)

## 2017-10-16 LAB — TROPONIN I: Troponin I: <0.03 ng/mL (ref <0.03)

## 2017-10-16 SURGERY — EGD (ESOPHAGOGASTRODUODENOSCOPY)
Anesthesia: Moderate Sedation

## 2017-10-16 SURGERY — ESOPHAGOGASTRODUODENOSCOPY (EGD) WITH PROPOFOL
Anesthesia: General

## 2017-10-16 MED ORDER — PIPERACILLIN-TAZOBACTAM 3.375 G IVPB
3.3750 g | Freq: Three times a day (TID) | INTRAVENOUS | Status: DC
  Administered 2017-10-16 – 2017-10-19 (×9): 3.375 g via INTRAVENOUS
  Filled 2017-10-16 (×10): qty 50

## 2017-10-16 MED ORDER — SODIUM CHLORIDE 0.9 % IV SOLN
8.0000 mg/h | INTRAVENOUS | Status: AC
  Administered 2017-10-16 – 2017-10-18 (×7): 8 mg/h via INTRAVENOUS
  Filled 2017-10-16 (×13): qty 80

## 2017-10-16 MED ORDER — AMLODIPINE BESYLATE 10 MG PO TABS: 10.0000 mg | ORAL_TABLET | Freq: Every day | ORAL | Status: DC

## 2017-10-16 MED ORDER — IOPAMIDOL (ISOVUE-300) INJECTION 61%
15.0000 mL | INTRAVENOUS | Status: DC
Start: 2017-10-16 — End: 2017-10-16

## 2017-10-16 MED ORDER — ALBUMIN HUMAN 5 % IV SOLN
12.5000 g | Freq: Once | INTRAVENOUS | Status: AC
  Administered 2017-10-16: 12.5 g via INTRAVENOUS

## 2017-10-16 MED ORDER — FENTANYL CITRATE (PF) 100 MCG/2ML IJ SOLN
50.0000 ug | INTRAMUSCULAR | Status: DC | PRN
  Administered 2017-10-16 – 2017-10-17 (×2): 50 ug via INTRAVENOUS
  Filled 2017-10-16 (×2): qty 2

## 2017-10-16 MED ORDER — SUCCINYLCHOLINE CHLORIDE 20 MG/ML IJ SOLN
INTRAMUSCULAR | Status: DC | PRN
  Administered 2017-10-16: 100 mg via INTRAVENOUS

## 2017-10-16 MED ORDER — PROPOFOL 1000 MG/100ML IV EMUL
0.0000 ug/kg/min | INTRAVENOUS | Status: DC
  Filled 2017-10-16: qty 100

## 2017-10-16 MED ORDER — ALBUMIN HUMAN 5 % IV SOLN
INTRAVENOUS | Status: AC
  Filled 2017-10-16: qty 500

## 2017-10-16 MED ORDER — HYDRALAZINE HCL 20 MG/ML IJ SOLN: 2.0000 mg | Freq: Four times a day (QID) | INTRAMUSCULAR | Status: DC | PRN

## 2017-10-16 MED ORDER — FENTANYL CITRATE (PF) 250 MCG/5ML IJ SOLN
INTRAMUSCULAR | Status: AC
  Filled 2017-10-16: qty 5

## 2017-10-16 MED ORDER — INSULIN ASPART 100 UNIT/ML ~~LOC~~ SOLN
2.0000 [IU] | SUBCUTANEOUS | Status: DC
  Administered 2017-10-19: 2 [IU] via SUBCUTANEOUS

## 2017-10-16 MED ORDER — CHLORHEXIDINE GLUCONATE 0.12% ORAL RINSE (MEDLINE KIT)
15.0000 mL | Freq: Two times a day (BID) | OROMUCOSAL | Status: DC
  Administered 2017-10-16 – 2017-10-20 (×6): 15 mL via OROMUCOSAL

## 2017-10-16 MED ORDER — PROPOFOL 500 MG/50ML IV EMUL
INTRAVENOUS | Status: DC | PRN
  Administered 2017-10-16: 50 ug/kg/min via INTRAVENOUS

## 2017-10-16 MED ORDER — PANTOPRAZOLE SODIUM 40 MG IV SOLR: 40.0000 mg | Freq: Once | INTRAVENOUS | Status: DC

## 2017-10-16 MED ORDER — IOPAMIDOL (ISOVUE-300) INJECTION 61%
INTRAVENOUS | Status: AC
  Administered 2017-10-16: 100 mL via INTRAVENOUS
  Filled 2017-10-16: qty 100

## 2017-10-16 MED ORDER — IOPAMIDOL (ISOVUE-300) INJECTION 61%
INTRAVENOUS | Status: AC
  Filled 2017-10-16: qty 30

## 2017-10-16 MED ORDER — PROPOFOL 10 MG/ML IV BOLUS
INTRAVENOUS | Status: DC | PRN
Start: 2017-10-16 — End: 2017-10-16
  Administered 2017-10-16: 120 mg via INTRAVENOUS

## 2017-10-16 MED ORDER — PROCHLORPERAZINE EDISYLATE 5 MG/ML IJ SOLN
10.0000 mg | Freq: Once | INTRAMUSCULAR | Status: AC
  Administered 2017-10-16: 10 mg via INTRAVENOUS
  Filled 2017-10-16: qty 2

## 2017-10-16 MED ORDER — SODIUM CHLORIDE 0.9 % IV SOLN
80.0000 mg | Freq: Once | INTRAVENOUS | Status: AC
  Administered 2017-10-16: 80 mg via INTRAVENOUS
  Filled 2017-10-16: qty 80

## 2017-10-16 MED ORDER — LIDOCAINE HCL (CARDIAC) 20 MG/ML IV SOLN
INTRAVENOUS | Status: DC | PRN
  Administered 2017-10-16: 100 mg via INTRAVENOUS

## 2017-10-16 MED ORDER — PROPOFOL 1000 MG/100ML IV EMUL
5.0000 ug/kg/min | INTRAVENOUS | Status: AC
  Filled 2017-10-16: qty 100

## 2017-10-16 MED ORDER — EPHEDRINE SULFATE-NACL 50-0.9 MG/10ML-% IV SOSY
PREFILLED_SYRINGE | INTRAVENOUS | Status: DC | PRN
  Administered 2017-10-16 (×2): 10 mg via INTRAVENOUS

## 2017-10-16 MED ORDER — PROCHLORPERAZINE EDISYLATE 5 MG/ML IJ SOLN: 5.0000 mg | Freq: Once | INTRAMUSCULAR | Status: DC

## 2017-10-16 MED ORDER — LACTATED RINGERS IV SOLN
INTRAVENOUS | Status: AC
  Administered 2017-10-16 – 2017-10-17 (×2): via INTRAVENOUS

## 2017-10-16 MED ORDER — ORAL CARE MOUTH RINSE
15.0000 mL | Freq: Four times a day (QID) | OROMUCOSAL | Status: DC
  Administered 2017-10-16 – 2017-10-21 (×15): 15 mL via OROMUCOSAL

## 2017-10-16 MED ORDER — PROPOFOL 1000 MG/100ML IV EMUL
5.0000 ug/kg/min | INTRAVENOUS | Status: DC
  Administered 2017-10-16: 50 ug/kg/min via INTRAVENOUS

## 2017-10-16 MED ORDER — FENTANYL CITRATE (PF) 100 MCG/2ML IJ SOLN
INTRAMUSCULAR | Status: DC | PRN
  Administered 2017-10-16: 50 ug via INTRAVENOUS
  Administered 2017-10-16 (×2): 25 ug via INTRAVENOUS

## 2017-10-16 MED ORDER — LEVOTHYROXINE SODIUM 100 MCG IV SOLR
12.5000 ug | Freq: Every day | INTRAVENOUS | Status: DC
  Administered 2017-10-17 – 2017-10-18 (×2): 12.5 ug via INTRAVENOUS
  Filled 2017-10-16 (×2): qty 5

## 2017-10-16 MED ORDER — PHENYLEPHRINE HCL 10 MG/ML IJ SOLN
INTRAVENOUS | Status: DC | PRN
  Administered 2017-10-16: 25 ug/min via INTRAVENOUS

## 2017-10-16 MED ORDER — FENTANYL CITRATE (PF) 100 MCG/2ML IJ SOLN
25.0000 ug | INTRAMUSCULAR | Status: DC | PRN
  Administered 2017-10-16: 50 ug via INTRAVENOUS
  Filled 2017-10-16: qty 2

## 2017-10-16 MED ORDER — FENTANYL CITRATE (PF) 100 MCG/2ML IJ SOLN
50.0000 ug | INTRAMUSCULAR | Status: DC | PRN
  Administered 2017-10-17: 50 ug via INTRAVENOUS
  Filled 2017-10-16: qty 2

## 2017-10-16 MED ORDER — ALBUMIN HUMAN 5 % IV SOLN
INTRAVENOUS | Status: DC | PRN
  Administered 2017-10-16: 18:00:00 via INTRAVENOUS

## 2017-10-16 MED ORDER — SODIUM CHLORIDE 0.9 % IV SOLN
INTRAVENOUS | Status: DC
  Administered 2017-10-16: 19:00:00 via INTRAVENOUS

## 2017-10-16 MED ORDER — LACTATED RINGERS IV SOLN
INTRAVENOUS | Status: DC | PRN
  Administered 2017-10-16: 17:00:00 via INTRAVENOUS

## 2017-10-16 SURGICAL SUPPLY — 14 items

## 2017-10-16 NOTE — Consult Note (Addendum)
Referring Provider: Dr. Heber Glen Ullin, [facility] Primary Care Physician:  System, Pcp Not In Primary Gastroenterologist:  Unassigned  Reason for Consultation:  Epigastric pain and hematemesis  HPI: Anthony Hayes is a 82 y.o. male PMHx of HTN, hypothyroidism, and GERD presenting with the chief complaint of chest pain.  History was given by the patient and his daughter.  He apparently ate breakfast fine yesterday AM and then vomited a while later.  Then began complaining of chest pain radiating to his epigastrium.  He was brought to the ED for evaluation.  Basic cardiac evaluation was negative and cardiology does not think chest pain is cardiac.  Possibly GI related.  He had extensive cardiac evaluation one year ago.  Since the patient has been at the hospital he's been having episodes of hematesis/?hemoptysis.  Vomits blood with small clots and then begins hiccupping and coughing and brings up more blood.  BUN is slight elevated as compared to admission.  Last Hgb was around 1AM and at that point was normal/stable.  He is on PPI gtt.  Denies NSAID use.  Had EGD several years ago in [place], [place].  Takes ASA 81 mg at home.  Also takes Zantac daily.  His daughter says that he has been complaining of some heartburn recently despite the Zantac.   Past Medical History:  Diagnosis Date  . Arthritis    "joints" (10/15/2017)  . Benign prostatic hyperplasia   . Complication of anesthesia    "last OR in 06/2017 affected him cognitively; hasn't got back to baseline yet" (10/15/2017)  . Dementia   . Depression   . GERD (gastroesophageal reflux disease)   . High cholesterol   . Hypertension   . Hypothyroidism   . Renal disease   . Renal disorder   . TIA (transient ischemic attack) early 2000s    Past Surgical History:  Procedure Laterality Date  . ABDOMINAL EXPLORATION SURGERY  7/  . APPENDECTOMY    . CATARACT EXTRACTION W/ INTRAOCULAR LENS  IMPLANT, BILATERAL Bilateral   . COLECTOMY  1950s   "thought he  had iteilis"  . FEMUR FRACTURE SURGERY Right 1950s  . FRACTURE SURGERY    . KNEE ARTHROSCOPY Left   . POSTERIOR LUMBAR FUSION    . ROTATOR CUFF REPAIR Left   . TONSILLECTOMY      Prior to Admission medications   Medication Sig Start Date End Date Taking? Authorizing Provider  aspirin EC 81 MG tablet Take 81 mg by mouth daily.   Yes [provider]  imipramine (TOFRANIL) 25 MG tablet Take 25 mg by mouth at bedtime. 10/08/17  Yes [provider]  levothyroxine (SYNTHROID, LEVOTHROID) 50 MCG tablet Take 25 mcg by mouth every morning.  10/01/17  Yes [provider]  oxybutynin (DITROPAN) 5 MG tablet Take 5 mg by mouth every morning. 10/05/17  Yes [provider]  ranitidine (ZANTAC) 150 MG tablet Take 150 mg by mouth at bedtime. 09/28/17  Yes [provider]  sertraline (ZOLOFT) 50 MG tablet Take 50 mg by mouth every morning. 09/21/17  Yes [provider]    Current Facility-Administered Medications  Medication Dose Route Frequency Provider Last Rate Last Dose  . acetaminophen (TYLENOL) tablet 650 mg  650 mg Oral Q6H PRN Alphonzo Grieve, MD       Or  . acetaminophen (TYLENOL) suppository 650 mg  650 mg Rectal Q6H PRN Alphonzo Grieve, MD      . alum & mag hydroxide-simeth (MAALOX/MYLANTA) 200-200-20 MG/5ML suspension 30 mL  30 mL Oral Q6H PRN Melanee Spry, MD   30 mL at 10/15/17 1806  . aspirin EC tablet 81 mg  81 mg Oral Daily Svalina, Estill Dooms, MD      . levothyroxine (SYNTHROID, LEVOTHROID) tablet 25 mcg  25 mcg Oral Foy Guadalajara, Estill Dooms, MD      . nitroGLYCERIN (NITROSTAT) SL tablet 0.4 mg  0.4 mg Sublingual Q5 min PRN Gareth Morgan, MD   0.4 mg at 10/15/17 1228  . oxybutynin (DITROPAN) tablet 5 mg  5 mg Oral Peyton Bottoms, MD      . pantoprazole (PROTONIX) 80 mg in sodium chloride 0.9 % 250 mL (0.32 mg/mL) infusion  8 mg/hr Intravenous Continuous Colbert Ewing, MD 25 mL/hr at 10/16/17 0234 8 mg/hr at 10/16/17 0234  .  pantoprazole (PROTONIX) EC tablet 40 mg  40 mg Oral Daily Alphonzo Grieve, MD   40 mg at 10/15/17 1653  . sertraline (ZOLOFT) tablet 50 mg  50 mg Oral Peyton Bottoms, MD      . sucralfate (CARAFATE) 1 GM/10ML suspension 1 g  1 g Oral Q6H Molt, Bethany, DO   1 g at 10/15/17 2241    Allergies as of 10/15/2017  . (No Known Allergies)    History reviewed. No pertinent family history.  Social History   Social History  . Marital status: Widowed    Spouse name: N/A  . Number of children: N/A  . Years of education: N/A   Occupational History  . Not on file.   Social History Main Topics  . Smoking status: Former Smoker    Years: 7.00    Types: Cigarettes    Quit date: 1963  . Smokeless tobacco: Never Used  . Alcohol use Yes     Comment: 10/15/2017 "a few drinks/year"  . Drug use: No  . Sexual activity: Not on file   Other Topics Concern  . Not on file   Social History Narrative  . No narrative on file    Review of Systems: ROS is negative except as mentioned in HPI.  Physical Exam: Vital signs in last 24 hours: Temp:  [98.6 F (37 C)-98.8 F (37.1 C)] 98.7 F (37.1 C) (10/20 0624) Pulse Rate:  [50-84] 66 (10/20 0826) Resp:  [12-64] 15 (10/20 0826) BP: (143-197)/(74-101) 147/75 (10/20 0826) SpO2:  [90 %-98 %] 93 % (10/20 0826) Weight:  [152 lb 1.9 oz (69 kg)] 152 lb 1.9 oz (69 kg) (10/20 7169)   General:  Alert, Well-developed, well-nourished, pleasant and cooperative in NAD Head:  Normocephalic and atraumatic. Eyes:  Sclera clear, no icterus.  Conjunctiva pink. Ears:  Normal auditory acuity. Mouth:  No deformity or lesions.   Lungs:  Clear throughout to auscultation.  No wheezes, crackles, or rhonchi.  Heart:  Regular rate and rhythm; no murmurs, clicks, rubs, or gallops. Abdomen:  Soft, non-distended.  BS present.  Mild epigastric TTP. Msk:  Symmetrical without gross deformities. Pulses:  Normal pulses noted. Extremities:  Without clubbing or  edema. Neurologic:  Alert and oriented x 4;  grossly normal neurologically. Skin:  Intact without significant lesions or rashes. Psych:  Alert and cooperative. Normal mood and affect.  Intake/Output from previous day: 10/19 0701 - 10/20 0700 In: -  Out: 345 [Emesis/NG output:345]  Lab Results:  Recent Labs  10/15/17 1136 10/16/17 0112  WBC 12.8* 16.2*  HGB 13.6 13.5  HCT 38.9* 38.9*  PLT 366 427*   BMET  Recent Labs  10/15/17 1136 10/16/17 0053  NA  138 138  K 3.7 4.0  CL 106 104  CO2 26 23  GLUCOSE 107* 167*  BUN 17 28*  CREATININE 1.02 1.00  CALCIUM 9.3 9.4   LFT  Recent Labs  10/15/17 1136  PROT 6.9  ALBUMIN 3.8  AST 29  ALT 16*  ALKPHOS 119  BILITOT 0.5  BILIDIR 0.1  IBILI 0.4   Studies/Results: Dg Chest 2 View  Result Date: 10/15/2017 CLINICAL DATA:  Shortness of breath and midsternal chest pain EXAM: CHEST  2 VIEW COMPARISON:  None. FINDINGS: The lungs are clear. Heart is borderline enlarged with pulmonary vascularity within normal limits. No adenopathy. Bones are somewhat osteoporotic. No focal bone lesions are evident. IMPRESSION: Heart borderline enlarged. No edema or consolidation. Bones appear osteoporotic. Electronically Signed   By: Lowella Grip III M.D.   On: 10/15/2017 10:59   IMPRESSION:  *82 year old male who present with complaints of chest pain/epigastric pain after and episode of vomiting one day ago.  Pain thought to be non-cardiac, possibly GI, in origin.  Now with hematemesis, ? Hemoptysis.  Hiccupping.  Also has a leukocytosis.  ? If it is reactive or if there is something else driving it.  Not quite sure what is going on here.  ? If the bleeding is a result of the vomiting such as MWT.  PLAN: *Discussed with Dr. Ardis Hughs and he would like a CT chest and abdomen with contrast.  Patient may very well need EGD while he is here but will await results of CT scan. *Monitor Hgb.  Will get updated Hgb now. *Continue PPI gtt for  now.   ZEHR, JESSICA D.  10/16/2017, 8:45 AM  Pager number [phone]   ________________________________________________________________________  Velora Heckler GI MD note:  I personally examined the patient, reviewed the data and agree with the assessment and plan described above.  CT scan complete, I reviewed the images, met with the patient in the room today. He seems to have suffered significant damage to his esophagus. He is currently in some distress and is unable to manage his secretions and spitting out bloody very foul smelling material into a spit cup. The CT findings seem extreme for esophagitis and there is some fluid around distal esophagus which is very unusual for esophagitis. He is demented, cannot really help with history very well.  He denies previous dysphagis, daughter who is a speech therapist denies it as well. He is full code per daughter.    I spoke with CT surgery who looked at his scan. He does not have obvious perforation but clearly has a diffusely thickened, sick esophagus for unclear reasons.    I recommend direct inspection of the esophagus with EGD which may require ET intubation for airway protection, I will discuss with anesthesia.  We are looking at EGD in 1-2 hours from now.  I discussed with housestaff and they will be evaluating him and contacting crit care medicine about possible transfer more monitored setting. I recommend starting broad spectrum IV antibiotics    Owens Loffler, MD [facility] Pager [phone]

## 2017-10-16 NOTE — Interval H&P Note (Signed)
History and Physical Interval Note:  10/16/2017 4:25 PM  Anthony Hayes  has presented today for surgery, with the diagnosis of diffusely thickened esophagus  The various methods of treatment have been discussed with the patient and family. After consideration of risks, benefits and other options for treatment, the patient has consented to  Procedure(s): ESOPHAGOGASTRODUODENOSCOPY (EGD) WITH PROPOFOL (N/A) as a surgical intervention .  The patient's history has been reviewed, patient examined, no change in status, stable for surgery.  I have reviewed the patient's chart and labs.  Questions were answered to the patient's satisfaction.     Milus Banister

## 2017-10-16 NOTE — Progress Notes (Signed)
Pt continues with frequent episodes of hematemesis, 50-100 mL with each occurrence.  Pt's primary nurse, Charlett Nose, states that pt's family is very concerned about pt's continued bleeding.  Dr. Ronalee Red notified and updated.  Orders received.  Charlett Nose, RN updated regarding new orders.  Jodell Cipro, Charge Nurse

## 2017-10-16 NOTE — Progress Notes (Signed)
Patient to procedure EGD, family down to the procedure area with patient, consent has been signed, patient and family have been informed of risks and complications of procedure, IV's are infusing and patient is A&Ox4.

## 2017-10-16 NOTE — Progress Notes (Signed)
Pharmacy Antibiotic Note  Shashank Kwasnik is a 82 y.o. male admitted on 10/15/2017 with coughing and vomiting up blood.  ppi drip started.   Pharmacy has been consulted for zosyn dosing.  Cr 1, WBC 14, afebrile  Plan: Zosyn 3.375 GM IV q8 EI  Weight: 152 lb 1.9 oz (69 kg)  Temp (24hrs), Avg:98.7 F (37.1 C), Min:98.6 F (37 C), Max:98.8 F (37.1 C)   Recent Labs Lab 10/15/17 1136 10/16/17 0053 10/16/17 0112 10/16/17 1040  WBC 12.8*  --  16.2* 14.9*  CREATININE 1.02 1.00  --   --     CrCl cannot be calculated (Unknown ideal weight.).    No Known Allergies  Antimicrobials this admission: Zosyn   Dose adjustments this admission:   Microbiology results:   Bonnita Nasuti Pharm.D. CPP, BCPS Clinical Pharmacist [phone] 10/16/2017 3:29 PM

## 2017-10-16 NOTE — Transfer of Care (Signed)
Immediate Anesthesia Transfer of Care Note  Patient: Anthony Hayes  Procedure(s) Performed: ESOPHAGOGASTRODUODENOSCOPY (EGD) WITH PROPOFOL (N/A )  Patient Location: PACU  Anesthesia Type:General  Level of Consciousness: sedated and Patient remains intubated per anesthesia plan  Airway & Oxygen Therapy: Patient remains intubated per anesthesia plan and Patient placed on Ventilator (see vital sign flow sheet for setting)  Post-op Assessment: Report given to RN and Post -op Vital signs reviewed and stable  Post vital signs: Reviewed and stable  Last Vitals:  Vitals:   10/16/17 1847 10/16/17 1848  BP: (!) 113/55   Pulse: 69 66  Resp:  14  Temp:    SpO2: 100% 100%    Last Pain:  Vitals:   10/16/17 1651  TempSrc: Oral  PainSc:          Complications: No apparent anesthesia complications

## 2017-10-16 NOTE — Progress Notes (Signed)
Attempt to call report.

## 2017-10-16 NOTE — Anesthesia Procedure Notes (Signed)
Procedure Name: Intubation Date/Time: 10/16/2017 5:36 PM Performed by: Kyung Rudd Pre-anesthesia Checklist: Patient identified, Emergency Drugs available, Suction available and Patient being monitored Patient Re-evaluated:Patient Re-evaluated prior to induction Oxygen Delivery Method: Circle system utilized Preoxygenation: Pre-oxygenation with 100% oxygen Induction Type: IV induction, Rapid sequence and Cricoid Pressure applied Laryngoscope Size: Mac and 4 Grade View: Grade II Tube type: Oral Tube size: 8.0 mm Number of attempts: 1 Airway Equipment and Method: Stylet Placement Confirmation: ETT inserted through vocal cords under direct vision,  positive ETCO2 and breath sounds checked- equal and bilateral Secured at: 22 cm Tube secured with: Tape Dental Injury: Teeth and Oropharynx as per pre-operative assessment

## 2017-10-16 NOTE — Op Note (Signed)
[facility] Patient Name: Anthony Hayes Procedure Date : 10/16/2017 MRN: [id number] Attending MD: Milus Banister , MD Date of Birth: 06/16/1935 CSN: [id number] Age: 82 Admit Type: Inpatient Procedure:                Upper GI endoscopy Indications:              Hematemesis, Abnormal CT of the GI tract, Chest                            pain (non cardiac) Providers:                Milus Banister, MD, Burtis Junes, RN, William Dalton, Technician Referring MD:              Medicines:                General Anesthesia Complications:            No immediate complications. Estimated blood loss:                            None. Estimated Blood Loss:     Estimated blood loss: none. Procedure:                Pre-Anesthesia Assessment:                           - Prior to the procedure, a History and Physical                            was performed, and patient medications and                            allergies were reviewed. The patient's tolerance of                            previous anesthesia was also reviewed. The risks                            and benefits of the procedure and the sedation                            options and risks were discussed with the patient.                            All questions were answered, and informed consent                            was obtained. Prior Anticoagulants: The patient has                            taken no previous anticoagulant or antiplatelet                            agents. ASA  Grade Assessment: IV - A patient with                            severe systemic disease that is a constant threat                            to life. After reviewing the risks and benefits,                            the patient was deemed in satisfactory condition to                            undergo the procedure.                           After obtaining informed consent, the endoscope was        passed under direct vision. Throughout the                            procedure, the patient's blood pressure, pulse, and                            oxygen saturations were monitored continuously. The                            EG-[id number] ([id number]) scope was introduced through the                            mouth, and advanced to the duodenal bulb. The upper                            GI endoscopy was accomplished without difficulty.                            The patient tolerated the procedure well. Scope In: Scope Out: Findings:      The esophagus was filled with blood clot. It took about 45 minutes to       evacuate the clot to see the underlying esophageal mucosa. I used a Roth       net, suction, flushing. Eventually the lumen was basically clear of       clot. There was a benign appearing GE junction stricture with ulcerative       esophagitis for 2-3cm. The lumen through the stricture was 8-20m.       Proximal to the stricture was a deep mucosal tear that was 5cm long, 1cm       wide. The tear did not appear perforated. There were a few small blood       clots very aderent to the tear and some pin point oozing that I did not       treat.      The stomach was normal      The duodenum was normal. Impression:               - Severe ulcerative esophagitis with benign  appearing peptic stricture and a long, deep (but                            not perforated) mucosal tear in the distal esphagus                           - What appeared to be very thickened esophagus was                            actually a large lumen filling blood clot. Moderate Sedation:      none Recommendation:           - Return patient to ICU for ongoing care.                           - He will remain intubated overnight.                           - IV PPI infusion and empiric zosyn                           - He may require second look EGD later this                             admission or early after discharge. The peptic                            appearing stricture should be re-evaluated to                            confirm it is benign and dilated if needed. Procedure Code(s):        --- Professional ---                           [phone], Esophagogastroduodenoscopy, flexible,                            transoral; diagnostic, including collection of                            specimen(s) by brushing or washing, when performed                            (separate procedure) Diagnosis Code(s):        --- Professional ---                           K22.8, Other specified diseases of esophagus                           K92.0, Hematemesis                           R07.89, Other chest pain  R93.3, Abnormal findings on diagnostic imaging of                            other parts of digestive tract CPT copyright 2016 [employer]. All rights reserved. The codes documented in this report are preliminary and upon coder review may  be revised to meet current compliance requirements. Milus Banister, MD 10/16/2017 6:48:04 PM This report has been signed electronically. Number of Addenda: 0

## 2017-10-16 NOTE — H&P (View-Only) (Signed)
Referring Provider: Dr. Heber Mitchell, [facility] Primary Care Physician:  System, Pcp Not In Primary Gastroenterologist:  Unassigned  Reason for Consultation:  Epigastric pain and hematemesis  HPI: Anthony Hayes is a 82 y.o. male PMHx of HTN, hypothyroidism, and GERD presenting with the chief complaint of chest pain.  History was given by the patient and his daughter.  He apparently ate breakfast fine yesterday AM and then vomited a while later.  Then began complaining of chest pain radiating to his epigastrium.  He was brought to the ED for evaluation.  Basic cardiac evaluation was negative and cardiology does not think chest pain is cardiac.  Possibly GI related.  He had extensive cardiac evaluation one year ago.  Since the patient has been at the hospital he's been having episodes of hematesis/?hemoptysis.  Vomits blood with small clots and then begins hiccupping and coughing and brings up more blood.  BUN is slight elevated as compared to admission.  Last Hgb was around 1AM and at that point was normal/stable.  He is on PPI gtt.  Denies NSAID use.  Had EGD several years ago in [place], [place].  Takes ASA 81 mg at home.  Also takes Zantac daily.  His daughter says that he has been complaining of some heartburn recently despite the Zantac.   Past Medical History:  Diagnosis Date  . Arthritis    "joints" (10/15/2017)  . Benign prostatic hyperplasia   . Complication of anesthesia    "last OR in 06/2017 affected him cognitively; hasn't got back to baseline yet" (10/15/2017)  . Dementia   . Depression   . GERD (gastroesophageal reflux disease)   . High cholesterol   . Hypertension   . Hypothyroidism   . Renal disease   . Renal disorder   . TIA (transient ischemic attack) early 2000s    Past Surgical History:  Procedure Laterality Date  . ABDOMINAL EXPLORATION SURGERY  7/  . APPENDECTOMY    . CATARACT EXTRACTION W/ INTRAOCULAR LENS  IMPLANT, BILATERAL Bilateral   . COLECTOMY  1950s   "thought he  had iteilis"  . FEMUR FRACTURE SURGERY Right 1950s  . FRACTURE SURGERY    . KNEE ARTHROSCOPY Left   . POSTERIOR LUMBAR FUSION    . ROTATOR CUFF REPAIR Left   . TONSILLECTOMY      Prior to Admission medications   Medication Sig Start Date End Date Taking? Authorizing Provider  aspirin EC 81 MG tablet Take 81 mg by mouth daily.   Yes [provider]  imipramine (TOFRANIL) 25 MG tablet Take 25 mg by mouth at bedtime. 10/08/17  Yes [provider]  levothyroxine (SYNTHROID, LEVOTHROID) 50 MCG tablet Take 25 mcg by mouth every morning.  10/01/17  Yes [provider]  oxybutynin (DITROPAN) 5 MG tablet Take 5 mg by mouth every morning. 10/05/17  Yes [provider]  ranitidine (ZANTAC) 150 MG tablet Take 150 mg by mouth at bedtime. 09/28/17  Yes [provider]  sertraline (ZOLOFT) 50 MG tablet Take 50 mg by mouth every morning. 09/21/17  Yes [provider]    Current Facility-Administered Medications  Medication Dose Route Frequency Provider Last Rate Last Dose  . acetaminophen (TYLENOL) tablet 650 mg  650 mg Oral Q6H PRN Alphonzo Grieve, MD       Or  . acetaminophen (TYLENOL) suppository 650 mg  650 mg Rectal Q6H PRN Alphonzo Grieve, MD      . alum & mag hydroxide-simeth (MAALOX/MYLANTA) 200-200-20 MG/5ML suspension 30 mL  30 mL Oral Q6H PRN Melanee Spry, MD   30 mL at 10/15/17 1806  . aspirin EC tablet 81 mg  81 mg Oral Daily Svalina, Estill Dooms, MD      . levothyroxine (SYNTHROID, LEVOTHROID) tablet 25 mcg  25 mcg Oral Foy Guadal[place], Estill Dooms, MD      . nitroGLYCERIN (NITROSTAT) SL tablet 0.4 mg  0.4 mg Sublingual Q5 min PRN Gareth Morgan, MD   0.4 mg at 10/15/17 1228  . oxybutynin (DITROPAN) tablet 5 mg  5 mg Oral Peyton Bottoms, MD      . pantoprazole (PROTONIX) 80 mg in sodium chloride 0.9 % 250 mL (0.32 mg/mL) infusion  8 mg/hr Intravenous Continuous Colbert Ewing, MD 25 mL/hr at 10/16/17 0234 8 mg/hr at 10/16/17 0234  .  pantoprazole (PROTONIX) EC tablet 40 mg  40 mg Oral Daily Alphonzo Grieve, MD   40 mg at 10/15/17 1653  . sertraline (ZOLOFT) tablet 50 mg  50 mg Oral Peyton Bottoms, MD      . sucralfate (CARAFATE) 1 GM/10ML suspension 1 g  1 g Oral Q6H Molt, Bethany, DO   1 g at 10/15/17 2241    Allergies as of 10/15/2017  . (No Known Allergies)    History reviewed. No pertinent family history.  Social History   Social History  . Marital status: Widowed    Spouse name: N/A  . Number of children: N/A  . Years of education: N/A   Occupational History  . Not on file.   Social History Main Topics  . Smoking status: Former Smoker    Years: 7.00    Types: Cigarettes    Quit date: 1963  . Smokeless tobacco: Never Used  . Alcohol use Yes     Comment: 10/15/2017 "a few drinks/year"  . Drug use: No  . Sexual activity: Not on file   Other Topics Concern  . Not on file   Social History Narrative  . No narrative on file    Review of Systems: ROS is negative except as mentioned in HPI.  Physical Exam: Vital signs in last 24 hours: Temp:  [98.6 F (37 C)-98.8 F (37.1 C)] 98.7 F (37.1 C) (10/20 0624) Pulse Rate:  [50-84] 66 (10/20 0826) Resp:  [12-64] 15 (10/20 0826) BP: (143-197)/(74-101) 147/75 (10/20 0826) SpO2:  [90 %-98 %] 93 % (10/20 0826) Weight:  [152 lb 1.9 oz (69 kg)] 152 lb 1.9 oz (69 kg) (10/20 9983)   General:  Alert, Well-developed, well-nourished, pleasant and cooperative in NAD Head:  Normocephalic and atraumatic. Eyes:  Sclera clear, no icterus.  Conjunctiva pink. Ears:  Normal auditory acuity. Mouth:  No deformity or lesions.   Lungs:  Clear throughout to auscultation.  No wheezes, crackles, or rhonchi.  Heart:  Regular rate and rhythm; no murmurs, clicks, rubs, or gallops. Abdomen:  Soft, non-distended.  BS present.  Mild epigastric TTP. Msk:  Symmetrical without gross deformities. Pulses:  Normal pulses noted. Extremities:  Without clubbing or  edema. Neurologic:  Alert and oriented x 4;  grossly normal neurologically. Skin:  Intact without significant lesions or rashes. Psych:  Alert and cooperative. Normal mood and affect.  Intake/Output from previous day: 10/19 0701 - 10/20 0700 In: -  Out: 345 [Emesis/NG output:345]  Lab Results:  Recent Labs  10/15/17 1136 10/16/17 0112  WBC 12.8* 16.2*  HGB 13.6 13.5  HCT 38.9* 38.9*  PLT 366 427*   BMET  Recent Labs  10/15/17 1136 10/16/17 0053  NA  138 138  K 3.7 4.0  CL 106 104  CO2 26 23  GLUCOSE 107* 167*  BUN 17 28*  CREATININE 1.02 1.00  CALCIUM 9.3 9.4   LFT  Recent Labs  10/15/17 1136  PROT 6.9  ALBUMIN 3.8  AST 29  ALT 16*  ALKPHOS 119  BILITOT 0.5  BILIDIR 0.1  IBILI 0.4   Studies/Results: Dg Chest 2 View  Result Date: 10/15/2017 CLINICAL DATA:  Shortness of breath and midsternal chest pain EXAM: CHEST  2 VIEW COMPARISON:  None. FINDINGS: The lungs are clear. Heart is borderline enlarged with pulmonary vascularity within normal limits. No adenopathy. Bones are somewhat osteoporotic. No focal bone lesions are evident. IMPRESSION: Heart borderline enlarged. No edema or consolidation. Bones appear osteoporotic. Electronically Signed   By: Lowella Grip III M.D.   On: 10/15/2017 10:59   IMPRESSION:  *82 year old male who present with complaints of chest pain/epigastric pain after and episode of vomiting one day ago.  Pain thought to be non-cardiac, possibly GI, in origin.  Now with hematemesis, ? Hemoptysis.  Hiccupping.  Also has a leukocytosis.  ? If it is reactive or if there is something else driving it.  Not quite sure what is going on here.  ? If the bleeding is a result of the vomiting such as MWT.  PLAN: *Discussed with Dr. Ardis Hughs and he would like a CT chest and abdomen with contrast.  Patient may very well need EGD while he is here but will await results of CT scan. *Monitor Hgb.  Will get updated Hgb now. *Continue PPI gtt for  now.   ZEHR, JESSICA D.  10/16/2017, 8:45 AM  Pager number [phone]   ________________________________________________________________________  Velora Heckler GI MD note:  I personally examined the patient, reviewed the data and agree with the assessment and plan described above.  CT scan complete, I reviewed the images, met with the patient in the room today. He seems to have suffered significant damage to his esophagus. He is currently in some distress and is unable to manage his secretions and spitting out bloody very foul smelling material into a spit cup. The CT findings seem extreme for esophagitis and there is some fluid around distal esophagus which is very unusual for esophagitis. He is demented, cannot really help with history very well.  He denies previous dysphagis, daughter who is a speech therapist denies it as well. He is full code per daughter.    I spoke with CT surgery who looked at his scan. He does not have obvious perforation but clearly has a diffusely thickened, sick esophagus for unclear reasons.    I recommend direct inspection of the esophagus with EGD which may require ET intubation for airway protection, I will discuss with anesthesia.  We are looking at EGD in 1-2 hours from now.  I discussed with housestaff and they will be evaluating him and contacting crit care medicine about possible transfer more monitored setting. I recommend starting broad spectrum IV antibiotics    Owens Loffler, MD [facility] Pager [phone]

## 2017-10-16 NOTE — Progress Notes (Signed)
   Subjective:  Patient seen and examined and is accompanied by his daughter. The daughter states that he was vomiting/coughing up blood all night and it was constant. She states he had greater than 6 episodes of hemoptysis/hematemesis. She states the patient is not vomiting blood but coughing up blood now. The patient denies chest pain this morning and states the pain has improved. He is complaining of thirst and hunger as he was NPO overnight. Denies shortness of breath or difficulty breathing.   Objective:  Vital signs in last 24 hours: Vitals:   10/15/17 2029 10/16/17 0035 10/16/17 0624 10/16/17 0826  BP: (!) 194/98 (!) 181/95 (!) 159/82 (!) 147/75  Pulse: 84  72 66  Resp: (!) 64 16 16 15   Temp: 98.8 F (37.1 C)  98.7 F (37.1 C)   TempSrc: Oral  Oral   SpO2: 95%  93% 93%  Weight:   152 lb 1.9 oz (69 kg)    General: Laying in bed, appears much more comfortable than yesterday, NAD HEENT: [place]/AT, dry blood noted on lips, no bleeding source identified in oral cavity; EOMI, no scleral icterus, PERRL Cardiac: RRR, No R/M/G appreciated Pulm: normal effort, CTAB Abd: soft, non tender, non distended, BS normal Ext: extremities well perfused, no peripheral edema Neuro: alert and oriented X3, cranial nerves II-XII grossly intact   Assessment/Plan:  Active Problems:   Chest pain Chest Pain/Epigastric pain, now with Hematemesis/Hemoptysis Patient remains hemodynamically stable, pain improving. Initial concern for ACS, but pain is non-cardiac in origin with troponin negative x 3, no acute ischemic changes on EKG, now with recurrent hiccups and hematemesis/hemoptysis. He is afebrile now with leukocytosis of 16.2>>14.9, possibly reactive. Hemoglobin stable at 13.5 on repeat 12.0 this AM. GI consulted and are getting a CT scan of the chest and abdomen. The patient may need an EGD while hospitalized, but will await CT scan results.  Unclear etiology at this point and differential is broad:  considerations include esophageal spasm, esophagitis, gastritis, pneumonia or respiratory infection, or GI or pulm malignancy.  -GI on board, appreciate recs -CT abdomen and chest, f/u results -Continue IV PPI -Currently NPO  -Holding Aspirin 81 mg -CBC in AM   Hypertension Hypertensive, most likely in setting of pain and discomfort. Home regimen includes Amlodipine 10 mg. -will hold BP med for now  -If consistently over 183 systolic can consider IV hydralazine  -Will continue to monitor  Dispo: Anticipated discharge in approximately 1-2 days  Melanee Spry, MD 10/16/2017, 11:13 AM Pager: [phone]

## 2017-10-16 NOTE — Anesthesia Preprocedure Evaluation (Addendum)
Anesthesia Evaluation  Patient identified by MRN, date of birth, ID band Patient awake    Reviewed: Allergy & Precautions, NPO status , Patient's Chart, lab work & pertinent test results  History of Anesthesia Complications Negative for: history of anesthetic complications  Airway Mallampati: III       Dental  (+) Caps, Teeth Intact, Dental Advisory Given   Pulmonary neg pulmonary ROS, former smoker,    Pulmonary exam normal        Cardiovascular hypertension, Normal cardiovascular exam     Neuro/Psych PSYCHIATRIC DISORDERS Depression Dementia TIA   GI/Hepatic Neg liver ROS, GERD  ,  Endo/Other  negative endocrine ROSHypothyroidism   Renal/GU   negative genitourinary   Musculoskeletal negative musculoskeletal ROS (+)   Abdominal   Peds negative pediatric ROS (+)  Hematology negative hematology ROS (+)   Anesthesia Other Findings   Reproductive/Obstetrics negative OB ROS                          Anesthesia Physical Anesthesia Plan  ASA: III and emergent  Anesthesia Plan: General   Post-op Pain Management:    Induction: Intravenous  PONV Risk Score and Plan: 2 and Ondansetron, Treatment may vary due to age or medical condition and Propofol infusion  Airway Management Planned: Oral ETT  Additional Equipment:   Intra-op Plan:   Post-operative Plan: Possible Post-op intubation/ventilation  Informed Consent: I have reviewed the patients History and Physical, chart, labs and discussed the procedure including the risks, benefits and alternatives for the proposed anesthesia with the patient or authorized representative who has indicated his/her understanding and acceptance.   Dental advisory given and Consent reviewed with POA  Plan Discussed with: CRNA, Anesthesiologist and Surgeon  Anesthesia Plan Comments:       Anesthesia Quick Evaluation

## 2017-10-16 NOTE — Progress Notes (Signed)
Patient came in from OR to PACU intubated with a 8.0 ETT taped at 24 cm at lip good BBS, SATS 100%, placed on above vent settings.

## 2017-10-16 NOTE — Anesthesia Postprocedure Evaluation (Signed)
Anesthesia Post Note  Patient: Anthony Hayes  Procedure(s) Performed: ESOPHAGOGASTRODUODENOSCOPY (EGD) WITH PROPOFOL (N/A )     Patient location during evaluation: SICU Anesthesia Type: General Level of consciousness: sedated Pain management: pain level controlled Vital Signs Assessment: post-procedure vital signs reviewed and stable Respiratory status: patient remains intubated per anesthesia plan Cardiovascular status: stable Postop Assessment: no apparent nausea or vomiting Anesthetic complications: no    Last Vitals:  Vitals:   10/16/17 2030 10/16/17 2056  BP: (!) 91/44 (!) 87/50  Pulse: (!) 57 (!) 57  Resp: 18 17  Temp:    SpO2: 97% 99%    Last Pain:  Vitals:   10/16/17 1651  TempSrc: Oral  PainSc:                  Alfredia Desanctis DANIEL

## 2017-10-16 NOTE — Progress Notes (Signed)
IMTS spoke to patient and patient's daughter about EGD procedure, intubation, initiating antibiotics, and possible transfer to the ICU after the EGD if post procedure extubation is not possible. Patient and daughter agree with the plan.   Consulted PCCM at request of Dr. Ardis Hughs. Discussed patient with Dr. Jimmey Ralph. If the patient fails extubation after EGD, PCCM would like to be notified and they will gladly accept him onto their service.   Will start Zosyn.   Arvil Chaco, MD Internal Medicine PGY1 Pager # [phone]

## 2017-10-16 NOTE — Consult Note (Signed)
PULMONARY / CRITICAL CARE MEDICINE   Name: Anthony Hayes MRN: [id number] DOB: 10-16-1935    ADMISSION DATE:  10/15/2017 CONSULTATION DATE:  10/16/2017  REFERRING MD:  Dr. Ardis Hughs   CHIEF COMPLAINT:  Ulcerative Esophagitis   HISTORY OF PRESENT ILLNESS:   82 year old male with dementia, depression, GERD, HTN, TIA, Hypothyroidism, Urinary Retention  Presents to ED on 10/19 with atypical chest pain. Cardiology consulted who believes it was GI related. Overnight patient had hematemesis. Went for Endoscopy on 10/20, revealed ulcerative esophagitis with deep mucosal tear and stricture. Patient remained intubated for the procedure and remained on mechanical ventilation and transferred to ICU. PCCM asked to consult.      Currently on mechanical ventilation (volume control) with propofol ggt and fentanyl pushes. Sinus Bradycardia. No acute events since transfer from EGD  PAST MEDICAL HISTORY :  He  has a past medical history of Arthritis; Benign prostatic hyperplasia; Complication of anesthesia; Dementia; Depression; GERD (gastroesophageal reflux disease); High cholesterol; Hypertension; Hypothyroidism; Renal disease; Renal disorder; and TIA (transient ischemic attack) (early 2000s).  PAST SURGICAL HISTORY: He  has a past surgical history that includes Tonsillectomy; Appendectomy; Knee arthroscopy (Left); Posterior lumbar fusion; Femur fracture surgery (Right, 1950s); Fracture surgery; Rotator cuff repair (Left); Cataract extraction w/ intraocular lens  implant, bilateral (Bilateral); Abdominal exploration surgery (7/); and Colectomy (1950s).  Allergies  Allergen Reactions  . Lisinopril Swelling    No current facility-administered medications on file prior to encounter.    No current outpatient prescriptions on file prior to encounter.    FAMILY HISTORY:  His indicated that his father is deceased.    SOCIAL HISTORY: He  reports that he quit smoking about 55 years ago. His smoking use  included Cigarettes. He quit after 7.00 years of use. He has never used smokeless tobacco. He reports that he drinks alcohol. He reports that he does not use drugs.  REVIEW OF SYSTEMS:   Unable to review as patient is intubated and sedated     VITAL SIGNS: BP 121/62   Pulse (!) 50   Temp 98.3 F (36.8 C) (Oral)   Resp 15   Wt 73.5 kg (162 lb 0.6 oz)   SpO2 97%      VENTILATOR SETTINGS: Vent Mode: PRVC FiO2 (%):  [40 %-50 %] 40 % Set Rate:  [14 bmp] 14 bmp Vt Set:  [500 mL] 500 mL PEEP:  [5 cmH20] 5 cmH20 Plateau Pressure:  [9 cmH20-15 cmH20] 10 cmH20  INTAKE / OUTPUT: I/O last 3 completed shifts: In: 950 [I.V.:700; IV Piggyback:250] Out: 3810 [Urine:850; Emesis/NG output:345; Other:500; Blood:50]  PHYSICAL EXAMINATION: General:  Elderly male, intubated on propofol ggt. Comfortable Neuro:  Sedated, RASS -4 , pupils intact  +cough  HEENT:  ETT in place  Cardiovascular:  [name], no MRG  Lungs:  Clear breath sounds, no wheeze/crackles. Synchronous with vent  Abdomen:  Non-distended, active bowel sounds  Musculoskeletal:  -edema withdraws to pain  Skin:  Warm, dry, intact   LABS:  BMET  Recent Labs Lab 10/15/17 1136 10/16/17 0053  NA 138 138  K 3.7 4.0  CL 106 104  CO2 26 23  BUN 17 28*  CREATININE 1.02 1.00  GLUCOSE 107* 167*    Electrolytes  Recent Labs Lab 10/15/17 1136 10/16/17 0053 10/16/17 0112  CALCIUM 9.3 9.4  --   MG  --   --  2.1    CBC  Recent Labs Lab 10/15/17 1136 10/16/17 0112 10/16/17 1040  WBC 12.8* 16.2* 14.9*  HGB 13.6 13.5 12.0*  HCT 38.9* 38.9* 35.4*  PLT 366 427* 399    Coag's No results for input(s): APTT, INR in the last 168 hours.  Sepsis Markers No results for input(s): LATICACIDVEN, PROCALCITON, O2SATVEN in the last 168 hours.  ABG  Recent Labs Lab 10/16/17 2140  PHART 7.414  PCO2ART 34.8  PO2ART 115.0*    Liver Enzymes  Recent Labs Lab 10/15/17 1136  AST 29  ALT 16*  ALKPHOS 119  BILITOT 0.5   ALBUMIN 3.8    Cardiac Enzymes  Recent Labs Lab 10/15/17 1900 10/16/17 0053  TROPONINI <0.03 <0.03    Glucose  Recent Labs Lab 10/16/17 2138  GLUCAP 113*    Imaging Ct Chest W Contrast  Result Date: 10/16/2017 CLINICAL DATA:  Chest pain. Hematemesis. Elevated white blood count. EXAM: CT CHEST, ABDOMEN, AND PELVIS WITHOUT AND WITH CONTRAST TECHNIQUE: Multidetector CT imaging of the chest, abdomen and pelvis was performed following the standard protocol before and during bolus administration of intravenous contrast. CONTRAST:  100 mL Isovue 300 IV COMPARISON:  Chest x-ray 10/15/2017 FINDINGS: CT CHEST FINDINGS Cardiovascular: Heart size within normal limits. Mild coronary calcification. Mild atherosclerotic calcification aortic arch without aneurysm or dissection. Pulmonary arteries appear normal. Pericardial effusion Mediastinum/Nodes: Severe diffuse thickening of the entire esophagus. Gas bubbles within the lumen compatible with residual food and/or blood in the esophageal lumen. No definite focal mass lesion in the esophagus. There is stranding in the mediastinal fat to the left of the distal esophagus which may be extraluminal fluid. No extraluminal gas identified. The esophagus is mildly dilated throughout. Lungs/Pleura: Small bilateral pleural effusions. Mild bibasilar atelectasis. 4 mm nodule right middle lobe. Musculoskeletal: Advanced degenerative change throughout the lower lumbar spine. No acute skeletal lesion. No fracture CT ABDOMEN PELVIS FINDINGS Hepatobiliary: 1 cm lesion in the right lobe liver. Otherwise liver is normal. Gallbladder and bile ducts normal. Pancreas: Negative Spleen: Negative Adrenals/Urinary Tract: Negative kidneys bilaterally. Mild bladder wall thickening. Stomach/Bowel: The esophagus is dilated and thickened down to the GE junction. The stomach is normal. No definite mass lesion at the GE junction. Nonobstructive bowel gas pattern. 15 mm round fatty density  with surrounding stranding right anterior upper pelvis. This may be acute or chronic fat necrosis of the epiploic appendage. Appendectomy. Vascular/Lymphatic: Mild atherosclerotic disease. Negative for aortic aneurysm. Reproductive: Mild prostate enlargement Other: No free-fluid.  Bilateral inguinal hernia containing fat. Musculoskeletal: Advanced lumbar degenerative change. Negative for fracture. Orthopedic pin in the femur. IMPRESSION: Severe circumferential thickening of the entire esophagus which is mildly dilated. Retained food or blood is present within the esophagus. In addition, there is extraluminal fluid around the distal esophagus on the left. No extraluminal gas. Findings most likely due to severe esophagitis. Underlying tumor not excluded however no focal mass lesion identified. Esophageal stricture cannot be excluded based on this study. Esophageal rupture is possible however no extraluminal gas is seen to suggest this diagnosis. Small bilateral pleural effusions with bibasilar atelectasis Stranding in the omentum in the right pelvis likely due to acute or chronic fat necrosis and epiploic appendage. Electronically Signed   By: Franchot Gallo M.D.   On: 10/16/2017 13:53   Ct Abdomen W Contrast  Result Date: 10/16/2017 CLINICAL DATA:  Chest pain. Hematemesis. Elevated white blood count. EXAM: CT CHEST, ABDOMEN, AND PELVIS WITHOUT AND WITH CONTRAST TECHNIQUE: Multidetector CT imaging of the chest, abdomen and pelvis was performed following the standard protocol before and during bolus administration of intravenous contrast. CONTRAST:  100  mL Isovue 300 IV COMPARISON:  Chest x-ray 10/15/2017 FINDINGS: CT CHEST FINDINGS Cardiovascular: Heart size within normal limits. Mild coronary calcification. Mild atherosclerotic calcification aortic arch without aneurysm or dissection. Pulmonary arteries appear normal. Pericardial effusion Mediastinum/Nodes: Severe diffuse thickening of the entire esophagus. Gas  bubbles within the lumen compatible with residual food and/or blood in the esophageal lumen. No definite focal mass lesion in the esophagus. There is stranding in the mediastinal fat to the left of the distal esophagus which may be extraluminal fluid. No extraluminal gas identified. The esophagus is mildly dilated throughout. Lungs/Pleura: Small bilateral pleural effusions. Mild bibasilar atelectasis. 4 mm nodule right middle lobe. Musculoskeletal: Advanced degenerative change throughout the lower lumbar spine. No acute skeletal lesion. No fracture CT ABDOMEN PELVIS FINDINGS Hepatobiliary: 1 cm lesion in the right lobe liver. Otherwise liver is normal. Gallbladder and bile ducts normal. Pancreas: Negative Spleen: Negative Adrenals/Urinary Tract: Negative kidneys bilaterally. Mild bladder wall thickening. Stomach/Bowel: The esophagus is dilated and thickened down to the GE junction. The stomach is normal. No definite mass lesion at the GE junction. Nonobstructive bowel gas pattern. 15 mm round fatty density with surrounding stranding right anterior upper pelvis. This may be acute or chronic fat necrosis of the epiploic appendage. Appendectomy. Vascular/Lymphatic: Mild atherosclerotic disease. Negative for aortic aneurysm. Reproductive: Mild prostate enlargement Other: No free-fluid.  Bilateral inguinal hernia containing fat. Musculoskeletal: Advanced lumbar degenerative change. Negative for fracture. Orthopedic pin in the femur. IMPRESSION: Severe circumferential thickening of the entire esophagus which is mildly dilated. Retained food or blood is present within the esophagus. In addition, there is extraluminal fluid around the distal esophagus on the left. No extraluminal gas. Findings most likely due to severe esophagitis. Underlying tumor not excluded however no focal mass lesion identified. Esophageal stricture cannot be excluded based on this study. Esophageal rupture is possible however no extraluminal gas is  seen to suggest this diagnosis. Small bilateral pleural effusions with bibasilar atelectasis Stranding in the omentum in the right pelvis likely due to acute or chronic fat necrosis and epiploic appendage. Electronically Signed   By: Franchot Gallo M.D.   On: 10/16/2017 13:53     STUDIES:  CXR 10/19 > Heart borderline enlarged. No edema or consolidation. Bones appear Osteoporotic CT Chest/ABD 10/20 > Severe circumferential thickening of the entire esophagus which is mildly dilated. Retained food or blood is present within the esophagus. In addition, there is extraluminal fluid around the distal esophagus on the left. No extraluminal gas. Findings most likely due to severe esophagitis. Underlying tumor not excluded however no focal mass lesion identified. Esophageal stricture cannot be excluded based on this study. Esophageal rupture is possible however no extraluminal gas is seen to suggest this diagnosis. Small bilateral pleural effusions with bibasilar atelectasis Stranding in the omentum in the right pelvis likely due to acute or chronic fat necrosis and epiploic appendage. Upper GI Endo 10/20 > The esophagus was filled with blood clot. It took about 45 minutes to evacuate the clot to see the underlying esophageal mucosa. I used a Roth  net, suction, flushing. Eventually the lumen was basically clear of clot. There was a benign appearing GE junction stricture with ulcerative esophagitis for 2-3cm. The lumen through the stricture was 8-58m. Proximal to the stricture was a deep mucosal tear that was 5cm long, 1cm wide. The tear did not appear perforated. There were a few small blood clots very aderent to the tear and some pin point oozing that I did not treat. The stomach  was normal. The duodenum was normal  CULTURES: MRSA PCR 10/19 > Negative   ANTIBIOTICS: Zosyn 10/20 >>  SIGNIFICANT EVENTS: 10/19 > Presents to ED   LINES/TUBES: ETT 10/20 >>  DISCUSSION: 82 year old male presents to ED  with Chest pain, found to have hematemesis, taken for upper GI endo on 10/20 in which revealed stricture of GE junction with ulcerative esophagitis and deep mucosal tear, remained intubated in post-operative setting   ASSESSMENT / PLAN:  PULMONARY A: Ventilator dependence secondary to post op air way protection P:   Vent Support  VAP Bundle  Trend CXR/ABG  Maintain Oxygen Saturation >92  Will need to discuss with GI in am before extubating if planning on repeating EGD.   CARDIOVASCULAR A: G1DD (EF 55)  H/O HTN  Sinus Bradycardia secondary to sedatives P:  Cardiac Monitoring  Hold ASA  Hydralazine PRN for systolic >711  Decrease propofol slowly and monitor HR. 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 ulcerative esophagitis with deep mucosal tear s/p Endo  H/O GERD  P:   GI Following > may need additional scope before discharge  NPO  Continue Protonix GTT  HEMATOLOGIC A:   No issues  P:  Trend CBC  SCDs Only   INFECTIOUS A:   Leucocytosis  P:   Trend WBC and Fever Curve  Continue Zosyn  Leukocytosis most likely reactive   ENDOCRINE A:   Hyperglycemia  H/O Hypothyroidism    P:   Trend Glucose  SSI  Continue Synthroid IV  NEUROLOGIC A:   H/O Dementia, Depression, TIA  P:   RASS goal: 0/->  Wean Propofol gtt to achieve RASS PRN fentanyl  Continue home Zoloft when taking po meds   FAMILY  - Updates: no family at bedside   - Inter-disciplinary family meet or Palliative Care meeting due by:  10/23/2017    CC Time: 429minutes   KHayden Pedro AGACNP-BC [facility]  Pgr: [phone] PCCM Pgr: [email or web address] Pager: [phone]

## 2017-10-16 NOTE — Progress Notes (Signed)
RN from [facility] called ,report given but still need time to clean the room call me when room is ready

## 2017-10-17 ENCOUNTER — Encounter (HOSPITAL_COMMUNITY): Payer: Self-pay | Admitting: Gastroenterology

## 2017-10-17 DIAGNOSIS — J9690 Respiratory failure, unspecified, unspecified whether with hypoxia or hypercapnia: Secondary | ICD-10-CM | POA: Diagnosis not present

## 2017-10-17 DIAGNOSIS — J9600 Acute respiratory failure, unspecified whether with hypoxia or hypercapnia: Secondary | ICD-10-CM

## 2017-10-17 DIAGNOSIS — K922 Gastrointestinal hemorrhage, unspecified: Secondary | ICD-10-CM

## 2017-10-17 LAB — BASIC METABOLIC PANEL
Anion gap: 8 (ref 5–15)
BUN: 26 mg/dL — AB (ref 6–20)
CHLORIDE: 109 mmol/L (ref 101–111)
CO2: 23 mmol/L (ref 22–32)
CREATININE: 1.02 mg/dL (ref 0.61–1.24)
Calcium: 8.6 mg/dL — ABNORMAL LOW (ref 8.9–10.3)
GFR calc Af Amer: >60 mL/min (ref >60)
GFR calc non Af Amer: >60 mL/min (ref >60)
Glucose, Bld: 113 mg/dL — ABNORMAL HIGH (ref 65–99)
Potassium: 3.5 mmol/L (ref 3.5–5.1)
Sodium: 140 mmol/L (ref 135–145)

## 2017-10-17 LAB — CBC
HCT: 27 % — ABNORMAL LOW (ref 39.0–52.0)
HCT: 28.1 % — ABNORMAL LOW (ref 39.0–52.0)
HEMOGLOBIN: 9 g/dL — AB (ref 13.0–17.0)
Hemoglobin: 9.2 g/dL — ABNORMAL LOW (ref 13.0–17.0)
MCH: 29.9 pg (ref 26.0–34.0)
MCH: 29.6 pg (ref 26.0–34.0)
MCHC: 33.3 g/dL (ref 30.0–36.0)
MCHC: 32.7 g/dL (ref 30.0–36.0)
MCV: 89.7 fL (ref 78.0–100.0)
MCV: 90.4 fL (ref 78.0–100.0)
PLATELETS: 274 10*3/uL (ref 150–400)
Platelets: 296 10*3/uL (ref 150–400)
RBC: 3.01 MIL/uL — ABNORMAL LOW (ref 4.22–5.81)
RBC: 3.11 MIL/uL — ABNORMAL LOW (ref 4.22–5.81)
RDW: 13.8 % (ref 11.5–15.5)
RDW: 13.9 % (ref 11.5–15.5)
WBC: 8.8 10*3/uL (ref 4.0–10.5)
WBC: 8.4 10*3/uL (ref 4.0–10.5)

## 2017-10-17 LAB — GLUCOSE, CAPILLARY
GLUCOSE-CAPILLARY: 113 mg/dL — AB (ref 65–99)
GLUCOSE-CAPILLARY: 102 mg/dL — AB (ref 65–99)
GLUCOSE-CAPILLARY: 94 mg/dL (ref 65–99)
GLUCOSE-CAPILLARY: 93 mg/dL (ref 65–99)
Glucose-Capillary: 85 mg/dL (ref 65–99)
Glucose-Capillary: 95 mg/dL (ref 65–99)

## 2017-10-17 LAB — MAGNESIUM: MAGNESIUM: 1.9 mg/dL (ref 1.7–2.4)

## 2017-10-17 LAB — TROPONIN I

## 2017-10-17 LAB — PHOSPHORUS: PHOSPHORUS: 3.3 mg/dL (ref 2.5–4.6)

## 2017-10-17 MED ORDER — POTASSIUM CHLORIDE 10 MEQ/100ML IV SOLN
10.0000 meq | INTRAVENOUS | Status: AC
  Administered 2017-10-17 (×3): 10 meq via INTRAVENOUS
  Filled 2017-10-17: qty 100

## 2017-10-17 MED ORDER — FENTANYL CITRATE (PF) 100 MCG/2ML IJ SOLN
50.0000 ug | INTRAMUSCULAR | Status: DC | PRN
  Administered 2017-10-17: 100 ug via INTRAVENOUS
  Administered 2017-10-17: 50 ug via INTRAVENOUS
  Filled 2017-10-17: qty 2

## 2017-10-17 MED ORDER — DEXTROSE 10 % IV SOLN
INTRAVENOUS | Status: DC
  Administered 2017-10-17: 30 mL/h via INTRAVENOUS

## 2017-10-17 NOTE — Procedures (Signed)
Extubation Procedure Note  Patient Details:   Name: Anthony Hayes DOB: October 19, 1935 MRN: [id number]   Airway Documentation:     Evaluation  O2 sats: stable throughout Complications: No apparent complications Patient did tolerate procedure well. Bilateral Breath Sounds: Diminished, Clear   Patient extubated to 4 L [place] without incident. Cuff leak noted prior to extubation. No respiratory distress or stridor noted.   Mali M Maelie Chriswell 10/17/2017, 8:45 AM

## 2017-10-17 NOTE — Progress Notes (Signed)
PULMONARY / CRITICAL CARE MEDICINE   Name: Anthony Hayes MRN: [id number] DOB: [birth year]    ADMISSION DATE:  10/15/2017 CONSULTATION DATE:  10/16/2017  REFERRING MD:  Dr. Ardis Hughs   CHIEF COMPLAINT:  Ulcerative Esophagitis   HISTORY OF PRESENT ILLNESS:   82 year old male with dementia, depression, GERD, HTN, TIA, Hypothyroidism, Urinary Retention  Presents to ED on 10/19 with atypical chest pain. Cardiology consulted who believes it was GI related. Overnight patient had hematemesis. Went for Endoscopy on 10/20, revealed ulcerative esophagitis with deep mucosal tear and stricture. Patient remained intubated for the procedure and remained on mechanical ventilation and transferred to ICU. PCCM asked to consult.      Currently on mechanical ventilation (volume control) with propofol ggt and fentanyl pushes. Sinus Bradycardia. No acute events since transfer from EGD  Subjective:  Awake and alert and  weaning well on 5/5 this morning.Pulling 500-600cc volumes, following commands.OK per GI to extubate.     VITAL SIGNS: BP (!) 109/58   Pulse (!) 45   Temp 98.6 F (37 C) (Oral)   Resp 14   Wt 163 lb 2.3 oz (74 kg)   SpO2 99%      VENTILATOR SETTINGS: Vent Mode: PSV;CPAP FiO2 (%):  [40 %-50 %] 40 % Set Rate:  [14 bmp] 14 bmp Vt Set:  [500 mL] 500 mL PEEP:  [5 cmH20] 5 cmH20 Pressure Support:  [5 cmH20] 5 cmH20 Plateau Pressure:  [9 cmH20-15 cmH20] 13 cmH20  INTAKE / OUTPUT: I/O last 3 completed shifts: In: 3571.2 [I.V.:2746.2; Other:25; IV [id number]:093] Out: 2160 [Urine:1265; Emesis/NG output:345; Other:500; Blood:50]  PHYSICAL EXAMINATION: General:  Elderly male,intubates, following commands, weaning on 5/5 Neuro:  Sedated, RASS -1 , pupils brisk and reactive,  +cough  HEENT:  ETT in place , normocephalic, atraumatic Cardiovascular: S1. S2,  Brady, no MRG  Lungs:  Clear breath sounds, no wheeze/crackles. Synchronous with vent, pulling 500-600 cc volumes   Abdomen:   Non-distended, active bowel sounds  Musculoskeletal:  -edema withdraws to pain  Skin:  Warm, dry, intact   LABS:  BMET  Recent Labs Lab 10/15/17 1136 10/16/17 0053 10/17/17 0341  NA 138 138 140  K 3.7 4.0 3.5  CL 106 104 109  CO2 26 23 23   BUN 17 28* 26*  CREATININE 1.02 1.00 1.02  GLUCOSE 107* 167* 113*    Electrolytes  Recent Labs Lab 10/15/17 1136 10/16/17 0053 10/16/17 0112 10/17/17 0341  CALCIUM 9.3 9.4  --  8.6*  MG  --   --  2.1 1.9  PHOS  --   --   --  3.3    CBC  Recent Labs Lab 10/16/17 0112 10/16/17 1040 10/17/17 0341  WBC 16.2* 14.9* 8.8  HGB 13.5 12.0* 9.0*  HCT 38.9* 35.4* 27.0*  PLT 427* 399 296    Coag's No results for input(s): APTT, INR in the last 168 hours.  Sepsis Markers No results for input(s): LATICACIDVEN, PROCALCITON, O2SATVEN in the last 168 hours.  ABG  Recent Labs Lab 10/16/17 2140  PHART 7.414  PCO2ART 34.8  PO2ART 115.0*    Liver Enzymes  Recent Labs Lab 10/15/17 1136  AST 29  ALT 16*  ALKPHOS 119  BILITOT 0.5  ALBUMIN 3.8    Cardiac Enzymes  Recent Labs Lab 10/15/17 1900 10/16/17 0053  TROPONINI <0.03 <0.03    Glucose  Recent Labs Lab 10/16/17 2138 10/16/17 2347 10/17/17 0359 10/17/17 0750  GLUCAP 113* 110* 113* 102*    Imaging Ct  Chest W Contrast  Result Date: 10/16/2017 CLINICAL DATA:  Chest pain. Hematemesis. Elevated white blood count. EXAM: CT CHEST, ABDOMEN, AND PELVIS WITHOUT AND WITH CONTRAST TECHNIQUE: Multidetector CT imaging of the chest, abdomen and pelvis was performed following the standard protocol before and during bolus administration of intravenous contrast. CONTRAST:  100 mL Isovue 300 IV COMPARISON:  Chest x-ray 10/15/2017 FINDINGS: CT CHEST FINDINGS Cardiovascular: Heart size within normal limits. Mild coronary calcification. Mild atherosclerotic calcification aortic arch without aneurysm or dissection. Pulmonary arteries appear normal. Pericardial effusion  Mediastinum/Nodes: Severe diffuse thickening of the entire esophagus. Gas bubbles within the lumen compatible with residual food and/or blood in the esophageal lumen. No definite focal mass lesion in the esophagus. There is stranding in the mediastinal fat to the left of the distal esophagus which may be extraluminal fluid. No extraluminal gas identified. The esophagus is mildly dilated throughout. Lungs/Pleura: Small bilateral pleural effusions. Mild bibasilar atelectasis. 4 mm nodule right middle lobe. Musculoskeletal: Advanced degenerative change throughout the lower lumbar spine. No acute skeletal lesion. No fracture CT ABDOMEN PELVIS FINDINGS Hepatobiliary: 1 cm lesion in the right lobe liver. Otherwise liver is normal. Gallbladder and bile ducts normal. Pancreas: Negative Spleen: Negative Adrenals/Urinary Tract: Negative kidneys bilaterally. Mild bladder wall thickening. Stomach/Bowel: The esophagus is dilated and thickened down to the GE junction. The stomach is normal. No definite mass lesion at the GE junction. Nonobstructive bowel gas pattern. 15 mm round fatty density with surrounding stranding right anterior upper pelvis. This may be acute or chronic fat necrosis of the epiploic appendage. Appendectomy. Vascular/Lymphatic: Mild atherosclerotic disease. Negative for aortic aneurysm. Reproductive: Mild prostate enlargement Other: No free-fluid.  Bilateral inguinal hernia containing fat. Musculoskeletal: Advanced lumbar degenerative change. Negative for fracture. Orthopedic pin in the femur. IMPRESSION: Severe circumferential thickening of the entire esophagus which is mildly dilated. Retained food or blood is present within the esophagus. In addition, there is extraluminal fluid around the distal esophagus on the left. No extraluminal gas. Findings most likely due to severe esophagitis. Underlying tumor not excluded however no focal mass lesion identified. Esophageal stricture cannot be excluded based on  this study. Esophageal rupture is possible however no extraluminal gas is seen to suggest this diagnosis. Small bilateral pleural effusions with bibasilar atelectasis Stranding in the omentum in the right pelvis likely due to acute or chronic fat necrosis and epiploic appendage. Electronically Signed   By: Franchot Gallo M.D.   On: 10/16/2017 13:53   Ct Abdomen W Contrast  Result Date: 10/16/2017 CLINICAL DATA:  Chest pain. Hematemesis. Elevated white blood count. EXAM: CT CHEST, ABDOMEN, AND PELVIS WITHOUT AND WITH CONTRAST TECHNIQUE: Multidetector CT imaging of the chest, abdomen and pelvis was performed following the standard protocol before and during bolus administration of intravenous contrast. CONTRAST:  100 mL Isovue 300 IV COMPARISON:  Chest x-ray 10/15/2017 FINDINGS: CT CHEST FINDINGS Cardiovascular: Heart size within normal limits. Mild coronary calcification. Mild atherosclerotic calcification aortic arch without aneurysm or dissection. Pulmonary arteries appear normal. Pericardial effusion Mediastinum/Nodes: Severe diffuse thickening of the entire esophagus. Gas bubbles within the lumen compatible with residual food and/or blood in the esophageal lumen. No definite focal mass lesion in the esophagus. There is stranding in the mediastinal fat to the left of the distal esophagus which may be extraluminal fluid. No extraluminal gas identified. The esophagus is mildly dilated throughout. Lungs/Pleura: Small bilateral pleural effusions. Mild bibasilar atelectasis. 4 mm nodule right middle lobe. Musculoskeletal: Advanced degenerative change throughout the lower lumbar spine. No acute  skeletal lesion. No fracture CT ABDOMEN PELVIS FINDINGS Hepatobiliary: 1 cm lesion in the right lobe liver. Otherwise liver is normal. Gallbladder and bile ducts normal. Pancreas: Negative Spleen: Negative Adrenals/Urinary Tract: Negative kidneys bilaterally. Mild bladder wall thickening. Stomach/Bowel: The esophagus is  dilated and thickened down to the GE junction. The stomach is normal. No definite mass lesion at the GE junction. Nonobstructive bowel gas pattern. 15 mm round fatty density with surrounding stranding right anterior upper pelvis. This may be acute or chronic fat necrosis of the epiploic appendage. Appendectomy. Vascular/Lymphatic: Mild atherosclerotic disease. Negative for aortic aneurysm. Reproductive: Mild prostate enlargement Other: No free-fluid.  Bilateral inguinal hernia containing fat. Musculoskeletal: Advanced lumbar degenerative change. Negative for fracture. Orthopedic pin in the femur. IMPRESSION: Severe circumferential thickening of the entire esophagus which is mildly dilated. Retained food or blood is present within the esophagus. In addition, there is extraluminal fluid around the distal esophagus on the left. No extraluminal gas. Findings most likely due to severe esophagitis. Underlying tumor not excluded however no focal mass lesion identified. Esophageal stricture cannot be excluded based on this study. Esophageal rupture is possible however no extraluminal gas is seen to suggest this diagnosis. Small bilateral pleural effusions with bibasilar atelectasis Stranding in the omentum in the right pelvis likely due to acute or chronic fat necrosis and epiploic appendage. Electronically Signed   By: Franchot Gallo M.D.   On: 10/16/2017 13:53   Dg Chest Port 1 View  Result Date: 10/16/2017 CLINICAL DATA:  Endotracheal tube placement EXAM: PORTABLE CHEST 1 VIEW COMPARISON:  10/15/2017 FINDINGS: An endotracheal tube has been placed with tip measuring 1.9 cm above the carina. Shallow inspiration with atelectasis in the lung bases. Small left pleural effusion with left basilar consolidation suggesting pneumonia. Aspiration could also have this appearance. Right lung is clear. No pneumothorax. Heart size and pulmonary vascularity are normal for technique. Degenerative changes in the shoulders.  IMPRESSION: 1. Endotracheal tube appears in satisfactory position. 2. Shallow inspiration with atelectasis in the lung bases. 3. Consolidation and small effusion on the left suggest pneumonia or possibly aspiration. Electronically Signed   By: Lucienne Capers M.D.   On: 10/16/2017 22:34     STUDIES:  CXR 10/19 > Heart borderline enlarged. No edema or consolidation. Bones appear Osteoporotic CT Chest/ABD 10/20 > Severe circumferential thickening of the entire esophagus which is mildly dilated. Retained food or blood is present within the esophagus. In addition, there is extraluminal fluid around the distal esophagus on the left. No extraluminal gas. Findings most likely due to severe esophagitis. Underlying tumor not excluded however no focal mass lesion identified. Esophageal stricture cannot be excluded based on this study. Esophageal rupture is possible however no extraluminal gas is seen to suggest this diagnosis. Small bilateral pleural effusions with bibasilar atelectasis Stranding in the omentum in the right pelvis likely due to acute or chronic fat necrosis and epiploic appendage. Upper GI Endo 10/20 > The esophagus was filled with blood clot. It took about 45 minutes to evacuate the clot to see the underlying esophageal mucosa. I used a Roth  net, suction, flushing. Eventually the lumen was basically clear of clot. There was a benign appearing GE junction stricture with ulcerative esophagitis for 2-3cm. The lumen through the stricture was 8-70m. Proximal to the stricture was a deep mucosal tear that was 5cm long, 1cm wide. The tear did not appear perforated. There were a few small blood clots very aderent to the tear and some pin point oozing that  I did not treat. The stomach was normal. The duodenum was normal  CULTURES: MRSA PCR 10/19 > Negative  Sputum 10/21>>  ANTIBIOTICS: Zosyn 10/20 >>  SIGNIFICANT EVENTS: 10/19 > Presents to ED   LINES/TUBES: ETT 10/20 >>  DISCUSSION: 82  year old male presents to ED with Chest pain, found to have hematemesis, taken for upper GI endo on 10/20 in which revealed stricture of GE junction with ulcerative esophagitis and deep mucosal tear, remained intubated in post-operative setting   ASSESSMENT / PLAN:  PULMONARY A: Ventilator dependence secondary to post op air way protection ? Aspiration P:   Vent Support  VAP Bundle  Trend CXR/ABG  Maintain Oxygen Saturation >92  Will extubate 10/21  am as OK with GI, and weaning well Mobilize and aggressive pulmonary toilet.  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 JArdis Hughs NPO and advance per GI orders ( NPO 10/21 per GI) Continue Protonix GTT  HEMATOLOGIC A:   Anemia Post GI Bleed  P:  Trend CBC  SCDs Only  Monitor for active bleeding  INFECTIOUS A:   Leukocytosis>> resolved 10/21 most likely reactive ? Aspiration per CXR on L ( Consolidation and small effusion on the left suggest pneumonia or possibly aspiration) P:   Trend WBC and Fever Curve  Continue Zosyn  Sputum for culture  ENDOCRINE A:   Hyperglycemia  H/O Hypothyroidism    P:   Trend Glucose  SSI  Continue Synthroid IV  NEUROLOGIC A:   H/O Dementia, Depression, TIA  P:   RASS goal: 0/->  Minimize sedation once extubated Continue home Zoloft when taking po meds   FAMILY  - Updates: no family at  bedside   - Inter-disciplinary family meet or Palliative Care meeting due by:  10/23/2017    CC Time: 45 minutes   SMagdalen Spatz, AGACNP-BC [facility]  PCCM Pgr: [phone]

## 2017-10-17 NOTE — Progress Notes (Signed)
[name] Progress Note    Since last GI note: EGD yesterday, see full report in chart: large blood clot from [name] tear.  He is intubated but awake and follows commands, resp tech believes he will be fine for extubation trial.  NO overt bleeding per RN (No melena, no subglottic blood)  Objective: Vital signs in last 24 hours: Temp:  [97.7 F (36.5 C)-98.8 F (37.1 C)] 98.4 F (36.9 C) (10/21 0316) Pulse Rate:  [43-74] 45 (10/21 0700) Resp:  [14-21] 14 (10/21 0700) BP: (81-190)/(40-92) 109/58 (10/21 0700) SpO2:  [92 %-100 %] 99 % (10/21 0700) FiO2 (%):  [40 %-50 %] 40 % (10/21 0316) Weight:  [162 lb 0.6 oz (73.5 kg)-163 lb 2.3 oz (74 kg)] 163 lb 2.3 oz (74 kg) (10/21 0230)   General: intubated, sedated Heart: regular rate and rythm Abdomen: soft, non-tender, non-distended, normal bowel sounds  Lab Results:  Recent Labs  10/16/17 0112 10/16/17 1040 10/17/17 0341  WBC 16.2* 14.9* 8.8  HGB 13.5 12.0* 9.0*  PLT 427* 399 296  MCV 88.6 88.5 89.7    Recent Labs  10/15/17 1136 10/16/17 0053 10/17/17 0341  NA 138 138 140  K 3.7 4.0 3.5  CL 106 104 109  CO2 26 23 23   GLUCOSE 107* 167* 113*  BUN 17 28* 26*  CREATININE 1.02 1.00 1.02  CALCIUM 9.3 9.4 8.6*    Recent Labs  10/15/17 1136  PROT 6.9  ALBUMIN 3.8  AST 29  ALT 16*  ALKPHOS 119  BILITOT 0.5  BILIDIR 0.1  IBILI 0.4     Medications: Scheduled Meds: . chlorhexidine gluconate (MEDLINE KIT)  15 mL Mouth Rinse BID  . insulin aspart  2-6 Units Subcutaneous Q4H  . levothyroxine  12.5 mcg Intravenous Daily  . mouth rinse  15 mL Mouth Rinse QID  . oxybutynin  5 mg Oral BH-q7a  . sertraline  50 mg Oral BH-q7a  . sucralfate  1 g Oral Q6H   Continuous Infusions: . lactated ringers 75 mL/hr at 10/17/17 0500  . pantoprozole (PROTONIX) infusion 8 mg/hr (10/17/17 0400)  . piperacillin-tazobactam (ZOSYN)  IV Stopped (10/17/17 0538)  . propofol (DIPRIVAN) infusion 5 mcg/kg/min  (10/17/17 0400)   PRN Meds:.acetaminophen **OR** acetaminophen, fentaNYL (SUBLIMAZE) injection, fentaNYL (SUBLIMAZE) injection, hydrALAZINE    Assessment/Plan: 82 y.o. male with GI bleed from large Mallory Weiss tear, GE junction stricture.  Distal esophagus stricture and large mallory weiss tear. The extensive clot was cleared yesterday during EGD.  His Hb has trended down, I think that is from previous bleeding.    OK to extubated today, should remain NPO except sips of water.  If swallowing water, secretions well today then will likely advance to clears tomorrow and then full liquids the day following if all is still well.  He will need another look in his esophagus; no longer than 1-2 weeks from now to reassess the stricture and biopsy/dilated it if needed. The GE junction stricture was not overtly neoplastic during EGD yesterday.  I would not want him to advance past full liquids prior to that second look.  Should remain on IV PPI (drip today, then BID for another 1-2 days).  We will continue to follow.  Zosyn was started for empiric coverage after CT suggested leak around his esophagus and WBC was elevated. Would continue IV abx another 2-3 days then consider oral course.  Milus Banister, MD  10/17/2017, 7:38 AM [facility] Pager [phone]

## 2017-10-18 ENCOUNTER — Inpatient Hospital Stay (HOSPITAL_COMMUNITY): Payer: Medicare Other

## 2017-10-18 ENCOUNTER — Encounter (HOSPITAL_COMMUNITY): Payer: Self-pay | Admitting: Internal Medicine

## 2017-10-18 DIAGNOSIS — K222 Esophageal obstruction: Secondary | ICD-10-CM

## 2017-10-18 DIAGNOSIS — J9601 Acute respiratory failure with hypoxia: Secondary | ICD-10-CM

## 2017-10-18 DIAGNOSIS — E876 Hypokalemia: Secondary | ICD-10-CM

## 2017-10-18 DIAGNOSIS — I10 Essential (primary) hypertension: Secondary | ICD-10-CM

## 2017-10-18 DIAGNOSIS — D62 Acute posthemorrhagic anemia: Secondary | ICD-10-CM

## 2017-10-18 DIAGNOSIS — K226 Gastro-esophageal laceration-hemorrhage syndrome: Secondary | ICD-10-CM | POA: Diagnosis present

## 2017-10-18 LAB — BASIC METABOLIC PANEL
Anion gap: 4 — ABNORMAL LOW (ref 5–15)
BUN: 14 mg/dL (ref 6–20)
CHLORIDE: 105 mmol/L (ref 101–111)
CO2: 26 mmol/L (ref 22–32)
CREATININE: 1.05 mg/dL (ref 0.61–1.24)
Calcium: 8.7 mg/dL — ABNORMAL LOW (ref 8.9–10.3)
GFR calc Af Amer: >60 mL/min (ref >60)
GFR calc non Af Amer: >60 mL/min (ref >60)
Glucose, Bld: 107 mg/dL — ABNORMAL HIGH (ref 65–99)
Potassium: 3.4 mmol/L — ABNORMAL LOW (ref 3.5–5.1)
SODIUM: 135 mmol/L (ref 135–145)

## 2017-10-18 LAB — GLUCOSE, CAPILLARY
GLUCOSE-CAPILLARY: 102 mg/dL — AB (ref 65–99)
GLUCOSE-CAPILLARY: 99 mg/dL (ref 65–99)
GLUCOSE-CAPILLARY: 81 mg/dL (ref 65–99)
GLUCOSE-CAPILLARY: 111 mg/dL — AB (ref 65–99)
Glucose-Capillary: 88 mg/dL (ref 65–99)

## 2017-10-18 LAB — CBC
HEMATOCRIT: 27.7 % — AB (ref 39.0–52.0)
HEMOGLOBIN: 9.2 g/dL — AB (ref 13.0–17.0)
MCH: 30.1 pg (ref 26.0–34.0)
MCHC: 33.2 g/dL (ref 30.0–36.0)
MCV: 90.5 fL (ref 78.0–100.0)
Platelets: 281 10*3/uL (ref 150–400)
RBC: 3.06 MIL/uL — ABNORMAL LOW (ref 4.22–5.81)
RDW: 13.6 % (ref 11.5–15.5)
WBC: 8.3 10*3/uL (ref 4.0–10.5)

## 2017-10-18 MED ORDER — POTASSIUM CHLORIDE CRYS ER 20 MEQ PO TBCR
40.0000 meq | EXTENDED_RELEASE_TABLET | Freq: Three times a day (TID) | ORAL | Status: AC
  Administered 2017-10-18 (×2): 40 meq via ORAL
  Filled 2017-10-18 (×2): qty 2

## 2017-10-18 MED ORDER — PANTOPRAZOLE SODIUM 40 MG PO TBEC
40.0000 mg | DELAYED_RELEASE_TABLET | Freq: Two times a day (BID) | ORAL | Status: DC
  Administered 2017-10-19 – 2017-10-22 (×7): 40 mg via ORAL
  Filled 2017-10-18 (×7): qty 1

## 2017-10-18 NOTE — Care Management Note (Addendum)
Case Management Note  Patient Details  Name: Mutasim Tuckey MRN: [id number] Date of Birth: 1935-11-11  Subjective/Objective:   From [facility] , he has a Radiation protection practitioner and cane at the ALF, two daughters at the bedside [name] and [place], they said the ALF works with [facility] for [place] and that's who they would like for patient to work with, they would like [place],  [place], [place], [place]. NCM called and left vm for [place] with [facility] for the referral.  NCM also notified Caryl Pina CSW  Regarding this information.  [name] states she will be transporting patient to ALF at discharge.  NCM awaiting to hear from [facility] with [facility] to make sure they can take referral.  NCM notified Resident to put orders in for [facility] services.                 Action/Plan: NCM will follow for dc needs.   Expected Discharge Date:                  Expected Discharge Plan:  Assisted Living / Rest Home  In-House Referral:  Clinical Social Work  Discharge planning Services  CM Consult  Post Acute Care Choice:  Home Health Choice offered to:     DME Arranged:    DME Agency:     HH Arranged:  PT, RN, OT, Speech Therapy HH Agency:  [facility]  Status of Service:  In process, will continue to follow  If discussed at Long Length of Stay Meetings, dates discussed:    Additional Comments:  Zenon Mayo, RN 10/18/2017, 4:18 PM

## 2017-10-18 NOTE — Progress Notes (Signed)
   Subjective:  Patient seen and examined, speech therapy at the bedside. He tolerated extubation well. He states his epigastric pain is still present, but tolerable. The patient has no acute complaints at this time and denies symptoms of bradycardia. The results of the EGD and care plan were discussed with the patient.   Objective:  Vital signs in last 24 hours: Vitals:   10/18/17 0500 10/18/17 0600 10/18/17 0700 10/18/17 0800  BP: 100/64 130/65 (!) 146/65 (!) 145/61  Pulse: (!) 44 (!) 41 (!) 40 (!) 42  Resp: 18 14 12 17   Temp:    98 F (36.7 C)  TempSrc:    Oral  SpO2: 100% 99% 100% 100%  Weight:  162 lb 4.1 oz (73.6 kg)     General: Laying in bed comfortably, NAD HEENT: [place]/AT, EOMI, no scleral icterus Cardiac: Bardycardia, regular rhythm, No R/M/G appreciated Pulm: normal effort, CTAB Abd: soft, non tender, non distended, BS decreased Ext: extremities well perfused, no peripheral edema Neuro: alert and oriented X3, cranial nerves II-XII grossly intact   Assessment/Plan:  Active Problems:   Chest pain   UGIB (upper gastrointestinal bleed)   GERD (gastroesophageal reflux disease)   Essential hypertension, benign   Respiratory failure (HCC)  Upper GI bleed 2/2 Mallory Weiss Tear, Esophageal ulcers EGD revealed severe ulcerative esophagitis with benign appearing peptic stricture and a long, deep non-perforated mucosal tear in the distal esophagus. Patient's pain controlled, no longer regurgitating clots/oral secretions. Saturating well on 4 liters of Foley.  -Continue PPI gtt until 10/22 @0200 - then transition to PO 40 mg protonix BID -Continue Zosyn, currently day 3 of abx  -Advancing to clear diet - passed speech and swallow -Will require repeat EGD to assess stricture in 1-2 weeks -Transfer to SDU -Continue to wean off supplemental oxygen as tolerated  Normocytic Anemia  2/2 acute blood loss in setting of UGIB. Hgb stable at 9.2. Has not required blood transfusion.    -CBC in AM -Holding aspirin -Continue to monitor closely  Bradycardia HR in the 40s. Patient currently asymptomatic. Not on beta or calcium channel blocker. Per patient's daughters this is chronic.  -Continue Cardiac monitoring   HTN Stable, BP 145/61.  -Continue PRN Hydralazine if BP >833 systolic or >383 diastolic    Dispo: Anticipated discharge in approximately 2-3 day(s).   Melanee Spry, MD 10/18/2017, 11:01 AM Pager: [phone]

## 2017-10-18 NOTE — Evaluation (Signed)
Physical Therapy Evaluation Patient Details Name: Anthony Hayes MRN: [id number] DOB: 1935-05-09 Today's Date: 10/18/2017   History of Present Illness  Presents to ED on 10/19 with atypical chest pain. Cardiology consulted who believes it was GI related. Overnight patient had hematemesis. Went for Endoscopy on 10/20, revealed ulcerative esophagitis with deep mucosal tear and stricture. Patient remained intubated for the procedure and remained on mechanical ventilation and transferred to ICU.    Clinical Impression  Past Medical History:  Diagnosis Date  . Arthritis    "joints" (10/15/2017)  . Benign prostatic hyperplasia   . Complication of anesthesia    "last OR in 06/2017 affected him cognitively; hasn't got back to baseline yet" (10/15/2017)  . Dementia   . Depression   . GERD (gastroesophageal reflux disease)   . High cholesterol   . Hypertension   . Hypothyroidism   . Renal disease   . Renal disorder   . TIA (transient ischemic attack) early 2000s   Pt admitted with above diagnosis. Pt currently with functional limitations due to the deficits listed below (see PT Problem List). Pt was able to ambulate with 3LO2 to keep sats above 90% as well as RW for support.  Should progress and be able to return to A living with HHPT.  Instructed in incentive spirometer.  Will follow acutely.  Pt will benefit from skilled PT to increase their independence and safety with mobility to allow discharge to the venue listed below.      Follow Up Recommendations Home health PT;Supervision - Intermittent    Equipment Recommendations  None recommended by PT    Recommendations for Other Services       Precautions / Restrictions Precautions Precautions: Fall Restrictions Weight Bearing Restrictions: No      Mobility  Bed Mobility               General bed mobility comments: In chair on arrival  Transfers Overall transfer level: Needs assistance Equipment used: Rolling walker (2  wheeled) Transfers: Sit to/from Stand Sit to Stand: Min guard;Min assist         General transfer comment: Needed assist to power up initially.  Ambulation/Gait Ambulation/Gait assistance: Min guard;Min assist;+2 safety/equipment Ambulation Distance (Feet): 290 Feet (30 feet and then 260 feet.  Seated rest break to place onO2) Assistive device: Rolling walker (2 wheeled) Gait Pattern/deviations: Step-through pattern;Decreased stride length;Trunk flexed;Drifts right/left;Shuffle   Gait velocity interpretation: Below normal speed for age/gender General Gait Details: Pt with slightly flexed posture.  Pt needed cues to stand tall.  Pt desat initially when on RA.  Placed 3LO2 on to keep sats >90%.    Stairs            Wheelchair Mobility    Modified Rankin (Stroke Patients Only)       Balance Overall balance assessment: Needs assistance         Standing balance support: Bilateral upper extremity supported;During functional activity Standing balance-[name] Scale: Poor Standing balance comment: relies on UE support for balance                             Pertinent Vitals/Pain Pain Assessment: Faces Faces Pain Scale: Hurts little more Pain Location: generalized and left UE Pain Descriptors / Indicators: Aching;Grimacing;Guarding Pain Intervention(s): Limited activity within patient's tolerance;Monitored during session;Premedicated before session;Repositioned    Home Living Family/patient expects to be discharged to:: Assisted living ([facility]) Living Arrangements: Alone Available Help at Discharge:  Friend(s);Personal care attendant;Available 24 hours/day;Available PRN/intermittently Type of Home: Assisted living Home Access: Level entry     Home Layout: One level Home Equipment: Walker - 4 wheels      Prior Function Level of Independence: Independent with assistive device(s);Needs assistance   Gait / Transfers Assistance Needed: used rollator  with Modif I in facility per daughter, walked length of football field to dining room. Also walked 1/4 mile daily with his friend and her dog.   ADL's / Homemaking Assistance Needed: PEr daughter SBA with Bathing and dressing        Hand Dominance        Extremity/Trunk Assessment   Upper Extremity Assessment Upper Extremity Assessment: Defer to OT evaluation    Lower Extremity Assessment Lower Extremity Assessment: Generalized weakness    Cervical / Trunk Assessment Cervical / Trunk Assessment: Kyphotic  Communication   Communication: No difficulties  Cognition Arousal/Alertness: Awake/alert Behavior During Therapy: WFL for tasks assessed/performed Overall Cognitive Status: Within Functional Limits for tasks assessed                                        General Comments      Exercises General Exercises - Lower Extremity Ankle Circles/Pumps: AROM;Both;10 reps;Supine Long Arc Quad: AROM;Both;10 reps;Seated   Assessment/Plan    PT Assessment Patient needs continued PT services  PT Problem List Decreased activity tolerance;Decreased balance;Decreased strength;Decreased mobility;Decreased safety awareness;Decreased knowledge of precautions;Pain       PT Treatment Interventions DME instruction;Gait training;Functional mobility training;Therapeutic activities;Therapeutic exercise;Balance training;Patient/family education    PT Goals (Current goals can be found in the Care Plan section)  Acute Rehab PT Goals Patient Stated Goal: to go home PT Goal Formulation: With patient Time For Goal Achievement: 11/01/17 Potential to Achieve Goals: Good    Frequency Min 3X/week   Barriers to discharge        Co-evaluation               AM-PAC PT "6 Clicks" Daily Activity  Outcome Measure Difficulty turning over in bed (including adjusting bedclothes, sheets and blankets)?: A Lot Difficulty moving from lying on back to sitting on the side of the  bed? : A Lot Difficulty sitting down on and standing up from a chair with arms (e.g., wheelchair, bedside commode, etc,.)?: A Lot Help needed moving to and from a bed to chair (including a wheelchair)?: A Lot Help needed walking in hospital room?: A Little Help needed climbing 3-5 steps with a railing? : A Lot 6 Click Score: 13    End of Session Equipment Utilized During Treatment: Gait belt;Oxygen Activity Tolerance: Patient limited by fatigue Patient left: in chair;with call bell/phone within reach;with family/visitor present Nurse Communication: Mobility status PT Visit Diagnosis: Unsteadiness on feet (R26.81);Muscle weakness (generalized) (M62.81);Pain Pain - part of body: Arm (generalized)    Time: 8768-1157 PT Time Calculation (min) (ACUTE ONLY): 34 min   Charges:   PT Evaluation $PT Eval Moderate Complexity: 1 Mod PT Treatments $Gait Training: 8-22 mins   PT G Codes:        Cimone Fahey,PT Acute Rehabilitation [phone] (pager)   Denice Paradise 10/18/2017, 3:15 PM

## 2017-10-18 NOTE — Progress Notes (Addendum)
NIGHT FLOAT INTERIM PROGRESS NOTE  Paged by RN at 8:20 pm that patient had fallen while transferring himself from chair to bed and believed he may of hit his head. Upon entering the room the patient was resting comfortably in bed watching TV with HOB elevated. Unsure of what happened but believes he might have fallen when going to bed. Has no recollection of the event and is not sure if he hit his head. He is unsure if he urinated on himself. He denies headache, weakness, pain, or laceration.  Exam: On exam, vitals were stable. Slow mentation but was oriented x4 (person, place, year, president). His affect was blunted and unusual, occasionally making strange statements including asking if anyone wraps the cord (of the remote) around their necks and if I wanted to wrap the cord around my neck. He continued to ask "what-if" questions related to this, even when subject changed. He denies any homicidal ideation but when asked if this was something he was considering he paused and said "some mornings" but not right now. His neurologic exam was intact and had symmetric and appropriate strength of BL UE and LE's. Sensation intact throughout. CN's grossly intact. There was no visible bruising, hematoma or lacerations.   Assessment & Plan: Per RN he is forgetful at baseline, unsure of how much of this is usual for this patient however given his fall and strange mentation will order CT head w/o contrast to evaluate. He is NOT on blood thinners due to recent UGI bleed. I discussed this with RN who will notify me of any changes in condition. I instructed the patient to request assistance for further transfers or to page RN should things change.  -Follow-up CT head -Will monitor closely -Observe for any changes in mental status -Could consider tele-sitter should patient continue to make unusual remarks related to above   ADDENDUM 11:45pm: CT Head w/o contrast negative for acute issue. Will continue to monitor. RN to  page should status change.

## 2017-10-18 NOTE — Progress Notes (Signed)
PULMONARY / CRITICAL CARE MEDICINE   Name: Anthony Hayes MRN: [id number] DOB: May 26, 1935    ADMISSION DATE:  10/15/2017 CONSULTATION DATE:  10/16/2017  REFERRING MD:  Anthony Hayes   CHIEF COMPLAINT:  Ulcerative Esophagitis   HISTORY OF PRESENT ILLNESS:   82 year old male with dementia, depression, GERD, HTN, TIA, Hypothyroidism, Urinary Retention  Presents to ED on 10/19 with atypical chest pain. Cardiology consulted who believes it was GI related. Overnight patient had hematemesis. Went for Endoscopy on 10/20, revealed ulcerative esophagitis with deep mucosal tear and stricture. Patient remained intubated for the procedure and remained on mechanical ventilation and transferred to ICU. PCCM asked to consult.      Currently on mechanical ventilation (volume control) with propofol ggt and fentanyl pushes. Sinus Bradycardia. No acute events since transfer from EGD  Subjective:  No events overnight  VITAL SIGNS: BP (!) 145/61 (BP Location: Right Arm)   Pulse (!) 42   Temp 98 F (36.7 C) (Oral)   Resp 17   Wt 73.6 kg (162 lb 4.1 oz)   SpO2 100%    VENTILATOR SETTINGS:    INTAKE / OUTPUT: I/O last 3 completed shifts: In: 3732.4 [I.V.:3157.4; Other:25; IV EGBTDVVOH:607] Out: 2140 [Urine:2140]  PHYSICAL EXAMINATION: General:  Elderly male, NAD Neuro:  Alert and interactive, moving all ext to command HEENT:  [name], PERRL, EOM-I and MMM Cardiovascular: [name] but regular, Nl S1/S2, -M/R/G. Lungs:  CTA bilaterally Abdomen:  Soft, NT, ND and +BS Musculoskeletal:  -edema and -tenderness Skin:  Warm, dry, intact   LABS:  BMET  Recent Labs Lab 10/16/17 0053 10/17/17 0341 10/18/17 0531  NA 138 140 135  K 4.0 3.5 3.4*  CL 104 109 105  CO2 23 23 26   BUN 28* 26* 14  CREATININE 1.00 1.02 1.05  GLUCOSE 167* 113* 107*   Electrolytes  Recent Labs Lab 10/16/17 0053 10/16/17 0112 10/17/17 0341 10/18/17 0531  CALCIUM 9.4  --  8.6* 8.7*  MG  --  2.1 1.9  --   PHOS  --   --   3.3  --     CBC  Recent Labs Lab 10/17/17 0341 10/17/17 1236 10/18/17 0531  WBC 8.8 8.4 8.3  HGB 9.0* 9.2* 9.2*  HCT 27.0* 28.1* 27.7*  PLT 296 274 281    Coag's No results for input(s): APTT, INR in the last 168 hours.  Sepsis Markers No results for input(s): LATICACIDVEN, PROCALCITON, O2SATVEN in the last 168 hours.  ABG  Recent Labs Lab 10/16/17 2140  PHART 7.414  PCO2ART 34.8  PO2ART 115.0*    Liver Enzymes  Recent Labs Lab 10/15/17 1136  AST 29  ALT 16*  ALKPHOS 119  BILITOT 0.5  ALBUMIN 3.8    Cardiac Enzymes  Recent Labs Lab 10/15/17 1900 10/16/17 0053 10/17/17 1236  TROPONINI <0.03 <0.03 <0.03    Glucose  Recent Labs Lab 10/17/17 1150 10/17/17 1549 10/17/17 2010 10/17/17 2341 10/18/17 0351 10/18/17 0807  GLUCAP 94 85 93 95 102* 99    Imaging Dg Chest Port 1 View  Result Date: 10/18/2017 CLINICAL DATA:  Respiratory failure.  Shortness of breath. EXAM: PORTABLE CHEST 1 VIEW COMPARISON:  One-view chest x-ray a 10/16/2017. CT of the chest 10/16/2017. FINDINGS: The heart size is normal. The patient has been extubated. Prominence of the aorta and pulmonary artery is is again noted. Left greater than right interstitial and airspace disease is again noted. There is little change. A left pleural effusion is present. Degenerative changes are  noted in the thoracic spine and both shoulders without change. IMPRESSION: 1. No significant change in bilateral interstitial and airspace disease, likely edema or atelectasis. 2. Stable small left pleural effusion. 3. Low lung volumes. 4. Interval extubation. Electronically Signed   By: [name] M.D.   On: 10/18/2017 07:30     STUDIES:  CXR 10/19 > Heart borderline enlarged. No edema or consolidation. Bones appear Osteoporotic CT Chest/ABD 10/20 > Severe circumferential thickening of the entire esophagus which is mildly dilated. Retained food or blood is present within the esophagus. In  addition, there is extraluminal fluid around the distal esophagus on the left. No extraluminal gas. Findings most likely due to severe esophagitis. Underlying tumor not excluded however no focal mass lesion identified. Esophageal stricture cannot be excluded based on this study. Esophageal rupture is possible however no extraluminal gas is seen to suggest this diagnosis. Small bilateral pleural effusions with bibasilar atelectasis Stranding in the omentum in the right pelvis likely due to acute or chronic fat necrosis and epiploic appendage. Upper GI Endo 10/20 > The esophagus was filled with blood clot. It took about 45 minutes to evacuate the clot to see the underlying esophageal mucosa. I used a Roth  net, suction, flushing. Eventually the lumen was basically clear of clot. There was a benign appearing GE junction stricture with ulcerative esophagitis for 2-3cm. The lumen through the stricture was 8-103m. Proximal to the stricture was a deep mucosal tear that was 5cm long, 1cm wide. The tear did not appear perforated. There were a few small blood clots very aderent to the tear and some pin point oozing that I did not treat. The stomach was normal. The duodenum was normal  CULTURES: MRSA PCR 10/19 > Negative  Sputum 10/21>>  ANTIBIOTICS: Zosyn 10/20 >>  SIGNIFICANT EVENTS: 10/19 > Presents to ED   LINES/TUBES: ETT 10/20 >>10/21  I reviewed CXR myself, infiltrate noted.  DISCUSSION: 82year old male presents to ED with Chest pain, found to have hematemesis, taken for upper GI endo on 10/20 in which revealed stricture of GE junction with ulcerative esophagitis and deep mucosal tear, remained intubated in post-operative setting   ASSESSMENT / PLAN:  PULMONARY A: Ventilator dependence secondary to post op air way protection ? 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 JaArdis HughsClear liquids then full liquids in AM Continue Protonix GTT  HEMATOLOGIC A:   Anemia Post GI Bleed  P:  Trend CBC  SCDs Only  Monitor for active bleeding  INFECTIOUS A:   Leukocytosis>> resolved 10/21 most likely reactive ? Aspiration per CXR on L ( Consolidation and small effusion on the left suggest pneumonia or possibly aspiration) P:   Trend WBC and Fever Curve  Continue Zosyn for total of 8 days Sputum for culture  ENDOCRINE A:  Hyperglycemia  H/O Hypothyroidism    P:   Trend Glucose  SSI  Continue Synthroid IV  NEUROLOGIC A:   H/O Dementia, Depression, TIA  P:   RASS goal: 0/->  Minimize sedation once extubated Continue home Zoloft when taking po meds   FAMILY  - Updates: Patient and daughter updated bedside  - Inter-disciplinary family meet or Palliative Care meeting due by:  10/23/2017   PCCM will sign off, please call back if needed.  Discussed with PCCM-NP and bedside RN.  Rush Farmer, M.D. [facility] Pulmonary/Critical Care Medicine. Pager: [phone]. After hours pager: [phone].

## 2017-10-18 NOTE — Progress Notes (Signed)
Internal Medicine Attending:   I saw and examined the patient. I reviewed the resident's note and I agree with the resident's findings and plan as documented in the resident's note. Extubated yesterday, currently doing well-on nasal cannula. Passed SLP eval this AM for liquid diet, will try to gradually advance diet to full liquid but will need to remain on liquid diet until repeat EGD in 1-2 weeks.  Will plan to transfer out of ICU to stepdown today,  Continue Zosyn for now, will change to Augmentin in 1-2 days when tolerating oral intake well.  Bradycardia noted but patient asymptomatic, continue to monitor on telemetry. Given prolonged QT would hold sertraline for now.  Ongoing replacement of potassium.

## 2017-10-18 NOTE — Evaluation (Signed)
Clinical/Bedside Swallow Evaluation Patient Details  Name: Anthony Hayes MRN: [id number] Date of Birth: [birth year]  Today's Date: 10/18/2017 Time: SLP Start Time (ACUTE ONLY): 0488 SLP Stop Time (ACUTE ONLY): 0838 SLP Time Calculation (min) (ACUTE ONLY): 16 min  Past Medical History:  Past Medical History:  Diagnosis Date  . Arthritis    "joints" (10/15/2017)  . Benign prostatic hyperplasia   . Complication of anesthesia    "last OR in 06/2017 affected him cognitively; hasn't got back to baseline yet" (10/15/2017)  . Dementia   . Depression   . GERD (gastroesophageal reflux disease)   . High cholesterol   . Hypertension   . Hypothyroidism   . Renal disease   . Renal disorder   . TIA (transient ischemic attack) early 2000s   Past Surgical History:  Past Surgical History:  Procedure Laterality Date  . ABDOMINAL EXPLORATION SURGERY  7/  . APPENDECTOMY    . CATARACT EXTRACTION W/ INTRAOCULAR LENS  IMPLANT, BILATERAL Bilateral   . COLECTOMY  1950s   "thought he had iteilis"  . ESOPHAGOGASTRODUODENOSCOPY (EGD) WITH PROPOFOL N/A 10/16/2017   Procedure: ESOPHAGOGASTRODUODENOSCOPY (EGD) WITH PROPOFOL;  Surgeon: Milus Banister, MD;  Location: [facility] ENDOSCOPY;  Service: Endoscopy;  Laterality: N/A;  . FEMUR FRACTURE SURGERY Right 1950s  . FRACTURE SURGERY    . KNEE ARTHROSCOPY Left   . POSTERIOR LUMBAR FUSION    . ROTATOR CUFF REPAIR Left   . TONSILLECTOMY     HPI:  82 yo male PMHx of HTN, hypothyroidism, TIA and GERD presenting with the chief complaint of chest pain. Developed hematemesis; emergent EGD revealed severe ulcerative esophagitis with benign appearing peptic stricture and a long, deep (but not perforated) mucosal tear in the distal esphagus. Intubated 10/20-10/21. CXR no significant change in bilateral interstitial and airspace disease, likely edema or atelectasis, stable small left pleural effusion   Assessment / Plan / Recommendation Clinical Impression  Pt alert,  following commands and presents with a clear but low vocal quality and strong volitional cough; denies previous dysphagia. Assessment limited to thin liquids as pt unable to initiate solids due to esophageal tear and repair. Oral and pharyngeal swallow appeared within normal limits without inidications of airway intrusion or residue across multiple cup and straw sips. Pt denies odonophagia or globus sensation with water. MD arrived and explained pt continues to have stricutre that will need to be addressed in the future and that surgery repaired esophgeal tear. Pt safe to initiate clears (and progress to solids as directed/recommended by MD). No further ST needed at this time.  SLP Visit Diagnosis: Dysphagia, unspecified (R13.10)    Aspiration Risk  Mild aspiration risk    Diet Recommendation Thin liquid (clear liquids)   Liquid Administration via: Cup;Straw Medication Administration: Via alternative means (IV?) Supervision: Patient able to self feed Postural Changes: Seated upright at 90 degrees;Remain upright for at least 30 minutes after po intake    Other  Recommendations Oral Care Recommendations: Oral care BID   Follow up Recommendations        Frequency and Duration            Prognosis        Swallow Study   General HPI: 82 yo male PMHx of HTN, hypothyroidism, TIA and GERD presenting with the chief complaint of chest pain. Developed hematemesis; emergent EGD revealed severe ulcerative esophagitis with benign appearing peptic stricture and a long, deep (but not perforated) mucosal tear in the distal esphagus. Intubated 10/20-10/21. CXR no significant  change in bilateral interstitial and airspace disease, likely edema or atelectasis, stable small left pleural effusion Type of Study: Bedside Swallow Evaluation Previous Swallow Assessment:  (none) Diet Prior to this Study: NPO Temperature Spikes Noted: No Respiratory Status: Nasal cannula History of Recent Intubation:  Yes Length of Intubations (days): 2 days Date extubated: 10/17/17 Behavior/Cognition: Alert;Cooperative;Pleasant mood Oral Cavity Assessment: Within Functional Limits Oral Care Completed by SLP: Yes Oral Cavity - Dentition: Adequate natural dentition Vision: Functional for self-feeding Self-Feeding Abilities: Able to feed self Patient Positioning: Upright in bed Baseline Vocal Quality: Low vocal intensity Volitional Cough: Strong Volitional Swallow: Able to elicit    Oral/Motor/Sensory Function Overall Oral Motor/Sensory Function: Within functional limits   Ice Chips Ice chips: Not tested   Thin Liquid Thin Liquid: Within functional limits Presentation: Cup;Straw    Nectar Thick Nectar Thick Liquid: Not tested   Honey Thick Honey Thick Liquid: Not tested   Puree Puree: Not tested (MD wants clears only)   Solid   GO   Solid: Not tested (MD wants clears only)        Mick Sell, Orbie Pyo 10/18/2017,8:57 AM   Orbie Pyo Colvin Caroli.Ed [employer] [phone]

## 2017-10-18 NOTE — Progress Notes (Signed)
          Daily Rounding Note  10/18/2017, 9:03 AM  LOS: 2 days   SUBJECTIVE:   Chief complaint: None.     No nausea or vomiting.  No belly pain.  Denies SOB, chest pain.  No BMs per RN.   Extubated yesterday mid day.  Passed bedside swallow eval "with flying colors" per RN.    OBJECTIVE:         Vital signs in last 24 hours:    Temp:  [97.9 F (36.6 C)-99.5 F (37.5 C)] 98 F (36.7 C) (10/22 0800) Pulse Rate:  [40-56] 42 (10/22 0800) Resp:  [12-22] 17 (10/22 0800) BP: (100-155)/(53-116) 145/61 (10/22 0800) SpO2:  [97 %-100 %] 100 % (10/22 0800) Weight:  [73.6 kg (162 lb 4.1 oz)] 73.6 kg (162 lb 4.1 oz) (10/22 0600)   Filed Weights   10/16/17 2125 10/17/17 0230 10/18/17 0600  Weight: 73.5 kg (162 lb 0.6 oz) 74 kg (163 lb 2.3 oz) 73.6 kg (162 lb 4.1 oz)   General: frail, aged appearing   Heart: RRR rates in 40s Chest: clear, decreased BS on left.  No cough or labored breathing Abdomen: soft, NT, ND.  BS hypoactive but not tinkling or tympanitic.    Extremities: no CCE.  Feet warm Neuro/Psych:  Oriented to hospital, [place], self.  Slow to answer, paucity of speech.    Lab Results:  Recent Labs  10/17/17 0341 10/17/17 1236 10/18/17 0531  WBC 8.8 8.4 8.3  HGB 9.0* 9.2* 9.2*  HCT 27.0* 28.1* 27.7*  PLT 296 274 281    ASSESMENT:   *  UGIB, hematemesis.    EGD 10/20: Mallory-Weiss Tear (large) with clot.  Benign looking stricture at [place] and severe ulcerative esophagitis.  Much time spent evacuating clot.  Thickened esophagus seen on CT was from large, lumen-fillling, blood clot.   On Zantac PTA.  Now on PPI drip.  81 mg ASA stopped, last dose 10/19  *  ABL anemia.  Hgb nadir of 9.  Hgbs stable.  No blood products to date.  *  Chest pain, hiccups.  ACS ruled out.  In retrospect sxs from GER, esophagitis.     Hiccups resolved as of my exam now.    PLAN   *  He may require second look EGD later this  admission or early post discharge. The peptic-appearing stricture should be re-evaluated to confirm it is benign and dilated if needed. Will decide exact timing as course progresses. WILL NOT ADVANCE BEYOND FULL LIQUIDS UNTIL REPEAT EGD  *  Protonix drip finishes tomorrow 10/23 at 0200.  Start PO BID after that.   *  Could probably go to SDU today.  RN planning to remove Foley.       Azucena Freed  10/18/2017, 9:03 AM Pager: [phone]     GI Attending   I have taken an interval history, reviewed the chart and examined the patient. I agree with the Advanced Practitioner's note, impression and recommendations.    Gatha Mayer, MD, [facility]

## 2017-10-19 LAB — CBC
HCT: 29.7 % — ABNORMAL LOW (ref 39.0–52.0)
Hemoglobin: 10.2 g/dL — ABNORMAL LOW (ref 13.0–17.0)
MCH: 30.6 pg (ref 26.0–34.0)
MCHC: 34.3 g/dL (ref 30.0–36.0)
MCV: 89.2 fL (ref 78.0–100.0)
PLATELETS: 310 10*3/uL (ref 150–400)
RBC: 3.33 MIL/uL — ABNORMAL LOW (ref 4.22–5.81)
RDW: 13.2 % (ref 11.5–15.5)
WBC: 8 10*3/uL (ref 4.0–10.5)

## 2017-10-19 LAB — BASIC METABOLIC PANEL
Anion gap: 9 (ref 5–15)
BUN: 10 mg/dL (ref 6–20)
CO2: 24 mmol/L (ref 22–32)
CREATININE: 1.04 mg/dL (ref 0.61–1.24)
Calcium: 9.2 mg/dL (ref 8.9–10.3)
Chloride: 104 mmol/L (ref 101–111)
GFR calc Af Amer: >60 mL/min (ref >60)
GLUCOSE: 105 mg/dL — AB (ref 65–99)
Potassium: 3.6 mmol/L (ref 3.5–5.1)
SODIUM: 137 mmol/L (ref 135–145)

## 2017-10-19 LAB — CULTURE, RESPIRATORY W GRAM STAIN
Culture: NORMAL
Special Requests: NORMAL

## 2017-10-19 LAB — GLUCOSE, CAPILLARY
GLUCOSE-CAPILLARY: 106 mg/dL — AB (ref 65–99)
GLUCOSE-CAPILLARY: 94 mg/dL (ref 65–99)
GLUCOSE-CAPILLARY: 96 mg/dL (ref 65–99)
GLUCOSE-CAPILLARY: 98 mg/dL (ref 65–99)
Glucose-Capillary: 97 mg/dL (ref 65–99)
Glucose-Capillary: 87 mg/dL (ref 65–99)
Glucose-Capillary: 122 mg/dL — ABNORMAL HIGH (ref 65–99)

## 2017-10-19 LAB — CULTURE, RESPIRATORY

## 2017-10-19 MED ORDER — LEVOTHYROXINE SODIUM 25 MCG PO TABS
25.0000 ug | ORAL_TABLET | Freq: Every day | ORAL | Status: DC
  Administered 2017-10-19 – 2017-10-22 (×4): 25 ug via ORAL
  Filled 2017-10-19 (×4): qty 1

## 2017-10-19 MED ORDER — AMOXICILLIN-POT CLAVULANATE 875-125 MG PO TABS
1.0000 | ORAL_TABLET | Freq: Two times a day (BID) | ORAL | Status: DC
  Administered 2017-10-19 – 2017-10-22 (×7): 1 via ORAL
  Filled 2017-10-19 (×7): qty 1

## 2017-10-19 NOTE — Progress Notes (Signed)
Pharmacy Antibiotic Note  Anthony Hayes is a 82 y.o. male admitted on 10/15/2017 with coughing and vomiting up blood, changed from IV infusion to oral formulations- has been started on clear liquid diet. Pharmacy has been consulted for zosyn dosing - on day 4.  Renal function remains stable. WBC has normalized and patient remains afebrile.  Plan: Continue Zosyn 3.375 GM IV q8 EI Would consider changing to oral formulation (Augmentin) in future since has diet and taking other oral medications. Monitor renal function, cx results, clinical picture, and length of therapy.  Height: 5' 6"  (167.6 cm) Weight: 161 lb 11.2 oz (73.3 kg) IBW/kg (Calculated) : 63.8  Temp (24hrs), Avg:98.6 F (37 C), Min:98 F (36.7 C), Max:99.2 F (37.3 C)   Recent Labs Lab 10/15/17 1136 10/16/17 0053  10/16/17 1040 10/17/17 0341 10/17/17 1236 10/18/17 0531 10/19/17 0359  WBC 12.8*  --   < > 14.9* 8.8 8.4 8.3 8.0  CREATININE 1.02 1.00  --   --  1.02  --  1.05 1.04  < > = values in this interval not displayed.  Estimated Creatinine Clearance: 51.1 mL/min (by C-G formula based on SCr of 1.04 mg/dL).    Allergies  Allergen Reactions  . Lisinopril Swelling    Antimicrobials this admission: Zosyn 10/20 >>  Dose adjustments this admission: N/A  Microbiology results: MRSA PCR 10/19: neg Tracheal Aspirate 10/21: rare GPRs  Doylene Canard, PharmD Clinical Pharmacist  Pager: [phone] Clinical Phone for 10/19/2017 until 3:30pm: [phone] If after 3:30pm, please call main pharmacy at [phone] 10/19/2017 7:53 AM

## 2017-10-19 NOTE — Progress Notes (Signed)
   Subjective:  Patient seen and examined. He denies pain or epigastric discomfort this morning. Tolerating clear liquid diet well and interested in advancing to full liquids.   When asked about his fall yesterday evening he just said "he got tangled up in something," and did not fall but slowly slid down the floor.   He is frustrated as he has been experiencing urinary and fecal incontinence because he can not make it to the bathroom in time. This is a new problem since being hospitalized. NT reported dark stools.    Objective:  Vital signs in last 24 hours: Vitals:   10/18/17 2015 10/19/17 0056 10/19/17 0449 10/19/17 0850  BP:  (!) 154/60 (!) 167/78 (!) 148/67  Pulse:  (!) 54 (!) 49   Resp:  (!) 23 (!) 23   Temp:  99.1 F (37.3 C) 98.6 F (37 C) 97.9 F (36.6 C)  TempSrc:  Oral Oral Oral  SpO2:  95% 96% 99%  Weight:   161 lb 11.2 oz (73.3 kg)   Height: 5' 6"  (1.676 m)      General: Sitting in chair, NAD HEENT: [place]/AT, EOMI, no scleral icterus Cardiac: RRR, No R/M/G appreciated Pulm: normal effort, CTAB Abd: soft, non tender, non distended, BS normal Ext: extremities well perfused, no peripheral edema Neuro: alert and oriented X3, cranial nerves II-XII grossly intact   Assessment/Plan:  Principal Problem:   UGIB (upper gastrointestinal bleed) Active Problems:   Chest pain   GERD (gastroesophageal reflux disease)   Essential hypertension, benign   Respiratory failure (HCC)   Mallory-Weiss syndrome   Esophageal stricture  Upper GI bleed 2/2 Mallory Weiss Tear, Esophageal ulcers Stable and improving. Patient denies pain, is tolerating a clear liquid diet, and is no longer regurgitating clots/oral secretions. Hemoglobin trending up to 10.2 today, leukocytosis resolved. Saturating well on RA.  -GI following, appreciate recs -40 mg PO protonix BID -D/c IV zosyn now that patient is tolerating diet >> PO Augmentin 875 Q12 hours -Transfer to med surg on tele -Will require  repeat EGD to assess stricture in 1-2 weeks  Normocytic Anemia  2/2 acute blood loss in setting of UGIB. Improving. Hgb 10.2.  -Holding aspirin -Continue to monitor closely  Bradycardia HR normalizing to 50-60 bpm. Patient currently asymptomatic. -Continue Cardiac monitoring   HTN Elevated this AM. Has been consistently hypertensive. BP 148/67. Home medicine maybe amlodipine 10 mg.  -Continue PRN Hydralazine if BP >929 systolic or >574 diastolic -will confirm amlodipine 10 mg prior to restarting   Dispo: Anticipated discharge in approximately 1 day.   Melanee Spry, MD 10/19/2017, 9:02 AM Pager: [phone]

## 2017-10-19 NOTE — Care Management (Addendum)
Case Management Note Initial Note Started By [name], RN, BSN Case Mnanager 10-18-17 Patient Details  Name: Anthony Hayes MRN: [id number] Date of Birth: December 11, 1935  Subjective/Objective:   From [facility] , he has a Radiation protection practitioner and cane at the ALF, two daughters at the bedside [name] and [name], they said the ALF works with [facility] [place] and that's who they would like for patient to work with, they would like [name],  [name], [name], [name]. NCM called and left vm for [name] with [facility] for the referral.  NCM also notified Caryl Pina CSW  Regarding this information.  [name] states she will be transporting patient to ALF at discharge.  NCM awaiting to hear from [facility] with [facility] to make sure they can take referral.  NCM notified Resident to put orders in for [facility] services.                 Action/Plan: NCM will follow for dc needs.   Expected Discharge Date:                         Expected Discharge Plan:  Assisted Living / Rest Home  In-House Referral:  Clinical Social Work  Discharge planning Services  CM Consult  Post Acute Care Choice:  Home Health Choice offered to: Patient- previously active.   DME Arranged: N/A   DME Agency:   N/A  HH Arranged:  PT, RN, OT, Speech Therapy HH Agency:  [facility]  Status of Service: Completed If discussed at [employer], dates discussed:    Additional Comments: 0955 10-22-17 Jacqlyn Krauss, RN,BSN [phone] CM did speak with Liaison for [place] will need resumption orders/ F2F once stable. Having an EGD this am. No further needs from CM at this time.    [place] 10-19-17 Jacqlyn Krauss, RN, BSN [phone] Pt previously active with [facility] to return to [facility]. [facility] pt will be ready for d/c soon. Pt has DME Rollator- Pt will need HH Orders and F2F once stable for d/c. Following for Oxygen as well- CM did encourage  patient to use the Incentive Spirometer.

## 2017-10-19 NOTE — Progress Notes (Signed)
Physical Therapy Treatment Patient Details Name: Anthony Hayes MRN: [id number] DOB: 04/10/1935 Today's Date: 10/19/2017    History of Present Illness Presents to ED on 10/19 with atypical chest pain. Cardiology consulted who believes it was GI related. Overnight patient had hematemesis. Went for Endoscopy on 10/20, revealed ulcerative esophagitis with deep mucosal tear and stricture. Patient remained intubated for the procedure and remained on mechanical ventilation and transferred to ICU.     PT Comments    Patient progressing well towards PT goals. Tolerated gait training with Min guard assist mostly with moments of Min A due to unsteadiness and difficulty navigating environment. Fatigues during mobility. HR ranged from 49-61 bpm. Does not recall falling yesterday. Will continue to follow and progress as tolerated.     Follow Up Recommendations  Home health PT;Supervision - Intermittent     Equipment Recommendations  None recommended by PT    Recommendations for Other Services       Precautions / Restrictions Precautions Precautions: Fall Precaution Comments: bradycardia Restrictions Weight Bearing Restrictions: No    Mobility  Bed Mobility Overal bed mobility: Needs Assistance Bed Mobility: Supine to Sit     Supine to sit: Min assist;HOB elevated     General bed mobility comments: Assist to scoot bottom to EOB, increased time.   Transfers Overall transfer level: Needs assistance Equipment used: Rolling walker (2 wheeled) Transfers: Sit to/from Stand Sit to Stand: Min guard         General transfer comment: Min guard to steady in standing. Stood from [employer], from chair x1. Transferred to chair post ambulation.  Ambulation/Gait Ambulation/Gait assistance: Min assist;Min guard Ambulation Distance (Feet): 275 Feet Assistive device: Rolling walker (2 wheeled) Gait Pattern/deviations: Step-through pattern;Decreased stride length;Trunk flexed;Drifts  right/left;Shuffle Gait velocity: decreased Gait velocity interpretation: Below normal speed for age/gender General Gait Details: Pt with slightly flexed posture.  Cues for RW proximity and upright posture. Sp02 remained >90% on RA. Running into chairs in room in close proximity.    Stairs            Wheelchair Mobility    Modified Rankin (Stroke Patients Only)       Balance Overall balance assessment: Needs assistance Sitting-balance support: Feet supported;No upper extremity supported Sitting balance-[name] Scale: Fair     Standing balance support: During functional activity;Bilateral upper extremity supported Standing balance-[name] Scale: Poor Standing balance comment: relies on UE support for balance                            Cognition Arousal/Alertness: Awake/alert Behavior During Therapy: WFL for tasks assessed/performed Overall Cognitive Status: Impaired/Different from baseline Area of Impairment: Memory                               General Comments: WFL for basic mobility tasks however does not recall his fall last night. Impaired memory.      Exercises      General Comments General comments (skin integrity, edema, etc.): HR ranged from 49-61 bpm.      Pertinent Vitals/Pain Pain Assessment: No/denies pain    Home Living                      Prior Function            PT Goals (current goals can now be found in the care plan section) Progress towards PT goals:  Progressing toward goals    Frequency    Min 3X/week      PT Plan Current plan remains appropriate    Co-evaluation              AM-PAC PT "6 Clicks" Daily Activity  Outcome Measure  Difficulty turning over in bed (including adjusting bedclothes, sheets and blankets)?: None Difficulty moving from lying on back to sitting on the side of the bed? : Unable Difficulty sitting down on and standing up from a chair with arms (e.g., wheelchair,  bedside commode, etc,.)?: A Little Help needed moving to and from a bed to chair (including a wheelchair)?: A Little Help needed walking in hospital room?: A Little Help needed climbing 3-5 steps with a railing? : A Lot 6 Click Score: 16    End of Session Equipment Utilized During Treatment: Gait belt Activity Tolerance: Patient limited by fatigue Patient left: in chair;with call bell/phone within reach;with chair alarm set Nurse Communication: Mobility status PT Visit Diagnosis: Unsteadiness on feet (R26.81);Muscle weakness (generalized) (M62.81)     Time: 5790-3833 PT Time Calculation (min) (ACUTE ONLY): 32 min  Charges:  $Gait Training: 8-22 mins $Therapeutic Activity: 8-22 mins                    G Codes:       Anthony Hayes, PT, DPT [phone]     Anthony Hayes 10/19/2017, 9:35 AM

## 2017-10-19 NOTE — Progress Notes (Signed)
          Daily Rounding Note  10/19/2017, 12:01 PM  LOS: 3 days   SUBJECTIVE:   Chief complaint:  Stool dark and loose to soft.  No abdominal pain.  No n/v.  Still on clears.   No chest pain  OBJECTIVE:         Vital signs in last 24 hours:    Temp:  [97.9 F (36.6 C)-99.2 F (37.3 C)] 97.9 F (36.6 C) (10/23 0850) Pulse Rate:  [45-76] 49 (10/23 0449) Resp:  [13-26] 23 (10/23 0449) BP: (114-167)/(44-78) 148/67 (10/23 0850) SpO2:  [83 %-100 %] 99 % (10/23 0850) Weight:  [73.3 kg (161 lb 11.2 oz)] 73.3 kg (161 lb 11.2 oz) (10/23 0449) Last BM Date: 10/18/17 Filed Weights   10/17/17 0230 10/18/17 0600 10/19/17 0449  Weight: 74 kg (163 lb 2.3 oz) 73.6 kg (162 lb 4.1 oz) 73.3 kg (161 lb 11.2 oz)   General: frail.  Alert.  comfortable   Heart: RRR Chest: clear bil.  No cough or dyspnea Abdomen: soft, NT.  ND.  Active BS  Extremities: no CCE Neuro/Psych:  Oriented to place, year, self.  Follows commands.  Moves all limbs.   Lab Results:  Recent Labs  10/17/17 1236 10/18/17 0531 10/19/17 0359  WBC 8.4 8.3 8.0  HGB 9.2* 9.2* 10.2*  HCT 28.1* 27.7* 29.7*  PLT 274 281 310      ASSESMENT:   *  UGIB, hematemesis.    EGD 10/20: Mallory-Weiss Tear (large) with clot.  Benign looking stricture at [place] and severe ulcerative esophagitis.  Much time spent evacuating clot.  On Zantac PTA.  completed PPI drip, now on PO, BID Protonix.  81 mg ASA stopped, last dose 10/19.  *  ABL anemia.  Hgb nadir of 9.  Hgbs improving, 10.2 today.  No blood products to date.   PLAN   *  Repeat EGD, determine timing (per Dr Carlean Purl).  Full liquids til then.   Oral Protonix BID.    Azucena Freed  10/19/2017, 12:01 PM Pager: [phone]    [place] [facility] Attending   I have taken an interval history, reviewed the chart and examined the patient. I agree with the Advanced Practitioner's note, impression and recommendations.   Improving  slowly EGD possible dilation Thurs vs Friday  Gatha Mayer, MD, [facility] [phone] (pager) 10/19/2017 5:50 PM

## 2017-10-19 NOTE — Progress Notes (Signed)
Internal Medicine Attending:   I saw and examined the patient. I reviewed the resident's note and I agree with the resident's findings and plan as documented in the resident's note. Noted fall last night- CT head neg.  Only complaint was some fecal incontience due to not being able to get to bath room on time.  Stool is dark and liquid.  No trouble tolerating liquid diet so far, no chest pain or odonophasia. On exam: no distress, heart RRR. Lungs CTAB, Abd soft and non tender. A/P Mallory weiss tear and esophageal stricture. -Hgb stable to improving. Otherwise doing well, suspect dark stools are from old blood. -Likely can advance diet to full liquid today- per GI - Change Zosyn to oral augmentin - Change PPI to oral. -Likely discharge tomorrow.

## 2017-10-19 NOTE — Progress Notes (Signed)
Pt is sitting in chair at shift change. Heard a noise from pt's room and upon entering, pt is found on the floor with back facing the window. It appears pt has urinated in the floor and stated to me he was trying to clean up the mess. Pt thinks he may have hit his head but is unsure of where he hit it. Possibly the wall. Pt does complain of some pain at the back of his head. Applied ice pack and notified the on call Physician. Vitals were obtained. BP 164/63, Pulse 91, O2 sats 100% on room air, and  Oral temp 98.5. Will continue to monitor pt for any changes.

## 2017-10-20 LAB — BASIC METABOLIC PANEL
Anion gap: 7 (ref 5–15)
BUN: 9 mg/dL (ref 6–20)
CALCIUM: 9.2 mg/dL (ref 8.9–10.3)
CO2: 26 mmol/L (ref 22–32)
Chloride: 105 mmol/L (ref 101–111)
Creatinine, Ser: 0.97 mg/dL (ref 0.61–1.24)
GFR calc Af Amer: >60 mL/min (ref >60)
GLUCOSE: 110 mg/dL — AB (ref 65–99)
POTASSIUM: 3.5 mmol/L (ref 3.5–5.1)
SODIUM: 138 mmol/L (ref 135–145)

## 2017-10-20 LAB — CBC
HEMATOCRIT: 29.9 % — AB (ref 39.0–52.0)
Hemoglobin: 10.2 g/dL — ABNORMAL LOW (ref 13.0–17.0)
MCH: 30.2 pg (ref 26.0–34.0)
MCHC: 34.1 g/dL (ref 30.0–36.0)
MCV: 88.5 fL (ref 78.0–100.0)
PLATELETS: 322 10*3/uL (ref 150–400)
RBC: 3.38 MIL/uL — ABNORMAL LOW (ref 4.22–5.81)
RDW: 13.1 % (ref 11.5–15.5)
WBC: 7 10*3/uL (ref 4.0–10.5)

## 2017-10-20 LAB — GLUCOSE, CAPILLARY
GLUCOSE-CAPILLARY: 111 mg/dL — AB (ref 65–99)
GLUCOSE-CAPILLARY: 94 mg/dL (ref 65–99)
GLUCOSE-CAPILLARY: 105 mg/dL — AB (ref 65–99)
Glucose-Capillary: 111 mg/dL — ABNORMAL HIGH (ref 65–99)
Glucose-Capillary: 120 mg/dL — ABNORMAL HIGH (ref 65–99)
Glucose-Capillary: 94 mg/dL (ref 65–99)

## 2017-10-20 NOTE — NC FL2 (Signed)
[place] LEVEL OF CARE SCREENING TOOL     IDENTIFICATION  Patient Name: Anthony Hayes Birthdate: 09-09-1935 Sex: male Admission Date (Current Location): 10/15/2017  [facility] and [place] Number:  Herbalist and Address:  [facility] [place], [place] [address] [place], [place] [address]      Provider Number: [id number]  Attending Physician Name and Address:  Lucious Groves, DO  Relative Name and Phone Number:  [name], daughter, [phone]    Current Level of Care: Hospital Recommended Level of Care: [place] Prior Approval Number:    Date Approved/Denied:   PASRR Number:    Discharge Plan: Other (Comment) (ALF)    Current Diagnoses: Patient Active Problem List   Diagnosis Date Noted  . Mallory-Weiss syndrome   . Esophageal stricture   . Respiratory failure ([place])   . UGIB (upper gastrointestinal bleed) 10/16/2017  . GERD (gastroesophageal reflux disease) 10/16/2017  . Essential hypertension, benign 10/16/2017  . Chest pain 10/15/2017    Orientation RESPIRATION BLADDER Height & Weight     Self, Place  Normal Incontinent, External catheter Weight: 152 lb (68.9 kg) Height:  _0  (167.6 cm)  BEHAVIORAL SYMPTOMS/MOOD NEUROLOGICAL BOWEL NUTRITION STATUS      Incontinent Diet (please see DC summary)  AMBULATORY STATUS COMMUNICATION OF NEEDS Skin   Limited Assist Verbally Normal                       Personal Care Assistance Level of Assistance  Bathing, Feeding, Dressing Bathing Assistance: Limited assistance Feeding assistance: Independent Dressing Assistance: Limited assistance     Functional Limitations Info  Sight, Hearing, Speech Sight Info: Adequate Hearing Info: Adequate Speech Info: Adequate    SPECIAL CARE FACTORS FREQUENCY  PT (By licensed PT)     PT Frequency: 5x/week              Contractures Contractures Info: Not present    Additional Factors Info  Code Status,  Allergies, Insulin Sliding Scale Code Status Info: Full Allergies Info: Lisinopril   Insulin Sliding Scale Info: insulin novolog every 4 hours       Current Medications (10/20/2017):  This is the current hospital active medication list Current Facility-Administered Medications  Medication Dose Route Frequency Provider Last Rate Last Dose  . acetaminophen (TYLENOL) tablet 650 mg  650 mg Oral Q6H PRN Alphonzo Grieve, MD   650 mg at 10/19/17 2215   Or  . acetaminophen (TYLENOL) suppository 650 mg  650 mg Rectal Q6H PRN Alphonzo Grieve, MD      . amoxicillin-clavulanate (AUGMENTIN) 875-125 MG per tablet 1 tablet  1 tablet Oral Q12H Alphonzo Grieve, MD   1 tablet at 10/20/17 0959  . chlorhexidine gluconate (MEDLINE KIT) (PERIDEX) 0.12 % solution 15 mL  15 mL Mouth Rinse BID Joni Reining C, DO   15 mL at 10/20/17 1000  . dextrose 10 % infusion   Intravenous Continuous Rigoberto Noel, MD 30 mL/hr at 10/18/17 1200    . hydrALAZINE (APRESOLINE) injection 2 mg  2 mg Intravenous Q6H PRN Lacroce, Hulen Shouts, MD      . insulin aspart (novoLOG) injection 2-6 Units  2-6 Units Subcutaneous Q4H Omar Person, NP   2 Units at 10/19/17 2214  . levothyroxine (SYNTHROID, LEVOTHROID) tablet 25 mcg  25 mcg Oral QAC breakfast Melanee Spry, MD   25 mcg at 10/20/17 0640  . MEDLINE mouth rinse  15 mL Mouth Rinse  QID Joni Reining C, DO   15 mL at 10/20/17 0400  . oxybutynin (DITROPAN) tablet 5 mg  5 mg Oral Peyton Bottoms, MD   5 mg at 10/20/17 0640  . pantoprazole (PROTONIX) EC tablet 40 mg  40 mg Oral BID Vena Rua, PA-C   40 mg at 10/20/17 1000  . sucralfate (CARAFATE) 1 GM/10ML suspension 1 g  1 g Oral Q6H Molt, Bethany, DO   1 g at 10/20/17 3845     Discharge Medications: Please see discharge summary for a list of discharge medications.  Relevant Imaging Results:  Relevant Lab Results:   Additional Information SSN: [id number]  Estanislado Emms, LCSW

## 2017-10-20 NOTE — Clinical Social Work Note (Signed)
Clinical Social Work Assessment  Patient Details  Name: Anthony Hayes MRN: [id number] Date of Birth: 11-05-1935  Date of referral:  10/19/17               Reason for consult:  Facility Placement (from [place])                Permission sought to share information with:  Facility Sport and exercise psychologist, Family Supports Permission granted to share information::  Yes, Verbal Permission Granted  Name::     [name]  Agency::  [facility]  Relationship::  daughter  Contact Information:  [phone]  Housing/Transportation Living arrangements for the past 2 months:  [place] of Information:  Patient Patient Interpreter Needed:  None Criminal Activity/Legal Involvement Pertinent to Current Situation/Hospitalization:  No - Comment as needed Significant Relationships:  Adult Children Lives with:  Facility Resident Do you feel safe going back to the place where you live?  Yes Need for family participation in patient care:  Yes (Comment)  Care giving concerns:  Patient is from [facility] for the past two months. PT recommending home health.   Social Worker assessment / plan:  CSW met with patient at bedside and talked to daughter, [name], via phone. Patient from [facility] and plans to return there with home health. RNCM following for [facility] needs and questionable O2 needs. Note possible plan for EGD? CSW to support with discharge back to ALF when medically ready.   Employment status:  Retired Forensic scientist:  Medicare PT Recommendations:  Home with [place] / Referral to community resources:  Other (Comment Required) (back to ALF)  Patient/Family's Response to care: Patient and daughter appreciative of care.  Patient/Family's Understanding of and Emotional Response to Diagnosis, Current Treatment, and Prognosis: Daughter with good understanding of patient's condition and hopeful for his return to ALF.  Emotional  Assessment Appearance:  Appears stated age Attitude/Demeanor/Rapport:  Other (appropriate) Affect (typically observed):  Flat Orientation:  Oriented to Self, Oriented to Place Alcohol / Substance use:  Not Applicable Psych involvement (Current and /or in the community):  No (Comment)  Discharge Needs  Concerns to be addressed:  Discharge Planning Concerns Readmission within the last 30 days:  No Current discharge risk:  Physical Impairment Barriers to Discharge:  Continued Medical Work up   Estanislado Emms, LCSW 10/20/2017, 10:54 AM

## 2017-10-20 NOTE — Progress Notes (Signed)
   Subjective:  Patient seen and examined. He denies pain or epigastric discomfort this morning. He does complain of confusion and difficulty remembering where he is and why he is here. He states he does not remember coming into the hospital. He states he is having trouble "piecing it together" and is "spinning his wheels," and seems frustrated by this.  Otherwise no acute medical complaints at this time.   Objective:  Vital signs in last 24 hours: Vitals:   10/19/17 1948 10/19/17 2348 10/20/17 0410 10/20/17 0746  BP: (!) 140/98 140/67 (!) 149/63 (!) 146/48  Pulse: (!) 51 (!) 56 (!) 41 (!) 47  Resp: 15 16 18 13   Temp: 99.2 F (37.3 C) 98.3 F (36.8 C) 98.3 F (36.8 C) 98.7 F (37.1 C)  TempSrc: Oral Oral Oral Oral  SpO2: 95% 92% 92% 97%  Weight:   152 lb (68.9 kg)   Height:       General: Sitting up in bed, NAD HEENT: [place]/AT, EOMI, no scleral icterus Cardiac: RRR, No R/M/G appreciated Pulm: normal effort, CTAB Abd: soft, non tender, non distended, BS normal Ext: extremities well perfused, no peripheral edema Neuro: alert and oriented X3, cranial nerves II-XII grossly intact   Assessment/Plan:  Principal Problem:   UGIB (upper gastrointestinal bleed) Active Problems:   Chest pain   GERD (gastroesophageal reflux disease)   Essential hypertension, benign   Respiratory failure (HCC)   Mallory-Weiss syndrome   Esophageal stricture  Upper GI bleed 2/2 Mallory Weiss Tear, Esophageal ulcers Stable and improving. Patient denies pain, is tolerating a full liquid diet. Hemoglobin stable at 10.2 today, leukocytosis resolved. Saturating well on RA.  -GI following, appreciate recs -- Repeat EGD 10/25 or 10/26 -40 mg PO protonix BID - Day 5 of antiobiotcs -- Augmentin 875 mg Q12 hours  Normocytic Anemia  2/2 acute blood loss in setting of UGIB. Stable, Hgb 10.2.  -Holding aspirin -Continue to monitor closely  Bradycardia Stable. Repeat EKG sinus bradycardia.  -D/c cardiac  monitoring   HTN Consistently hypertensive. BP 146/48. -Continue PRN Hydralazine if BP >696 systolic or >295 diastolic   Dispo: Anticipated discharge in approximately 1 day.   Melanee Spry, MD 10/20/2017, 8:53 AM Pager: [phone]

## 2017-10-20 NOTE — Progress Notes (Signed)
          Daily Rounding Note  10/20/2017, 3:00 PM  LOS: 4 days   SUBJECTIVE:   Chief complaint: none.  Stool brown.  Tolerating full liquids     Wondering about timing of discharge  OBJECTIVE:         Vital signs in last 24 hours:    Temp:  [98.3 F (36.8 C)-99.5 F (37.5 C)] 98.3 F (36.8 C) (10/24 1158) Pulse Rate:  [41-56] 45 (10/24 1158) Resp:  [13-18] 18 (10/24 1158) BP: (123-149)/(48-98) 123/88 (10/24 1158) SpO2:  [92 %-100 %] 100 % (10/24 1158) Weight:  [68.9 kg (152 lb)] 68.9 kg (152 lb) (10/24 0410) Last BM Date: 10/18/17 Filed Weights   10/18/17 0600 10/19/17 0449 10/20/17 0410  Weight: 73.6 kg (162 lb 4.1 oz) 73.3 kg (161 lb 11.2 oz) 68.9 kg (152 lb)   General: looks better, less pale.  alert   Heart: RRR Chest: clear bil. But BS diminished at bases.  No dyspnea or cough Abdomen: soft, NT.  ND.  BS hypoactive.    Extremities: no CCE Neuro/Psych:  Appropriate, laconic.  Follows commands.    Lab Results:  Recent Labs  10/18/17 0531 10/19/17 0359 10/20/17 0434  WBC 8.3 8.0 7.0  HGB 9.2* 10.2* 10.2*  HCT 27.7* 29.7* 29.9*  PLT 281 310 322      ASSESMENT:   * UGIB, hematemesis.  EGD 10/20: Mallory-Weiss Tear (large)with clot. Benign looking stricture at GE Jx, ulcerative esophagitis.  On Zantac PTA. completed PPI drip, now on PO, BID Protonix, Carafate. 81 mg ASA stopped, last dose 10/19.  * Acute Blood loss anemia. Hgb nadir of 9. Hgb improved and stable. No blood products to date.    PLAN   *  EGD 10/26.      Azucena Freed  10/20/2017, 3:00 PM Pager: [phone]  Agree with Ms. Sandi Carne assessment and plan. Gatha Mayer, MD, [facility]

## 2017-10-20 NOTE — Progress Notes (Signed)
Internal Medicine Attending:   I saw and examined the patient. I reviewed the resident's note and I agree with the resident's findings and plan as documented in the resident's note. Patient reports some confusion.  Cannot remember why he is at the hospital.  In previous dicussion with his daughters he did have confusion and functional decline after his abdominal surgery last year.  He currently denies any chest or abdominal pain. Agree with D/C of telemetry, has stable sinus bradycardia.  We repeated an EKG, he does still have a prolonged QT. Plan is for repeat EGD on Thursday or Friday.  Will have him NPO at [facility] incease this is able to be done tomorrow.  Continue oral Augmentin and Protonix.

## 2017-10-20 NOTE — Progress Notes (Signed)
ANTIBIOTIC CONSULT NOTE  Pharmacy Consult for Zosyn Indication: sepsis/aspiration  Allergies  Allergen Reactions  . Lisinopril Swelling    Patient Measurements: Height: 5' 6"  (167.6 cm) Weight: 152 lb (68.9 kg) IBW/kg (Calculated) : 63.8 Adjusted Body Weight:    Vital Signs: Temp: 98.7 F (37.1 C) (10/24 0746) Temp Source: Oral (10/24 0746) BP: 146/48 (10/24 0746) Pulse Rate: 47 (10/24 0746) Intake/Output from previous day: 10/23 0701 - 10/24 0700 In: 480 [P.O.:480] Out: 1500 [Urine:1500] Intake/Output from this shift: Total I/O In: 240 [P.O.:240] Out: -   Labs:  Recent Labs  10/18/17 0531 10/19/17 0359 10/20/17 0434  WBC 8.3 8.0 7.0  HGB 9.2* 10.2* 10.2*  PLT 281 310 322  CREATININE 1.05 1.04 0.97   Estimated Creatinine Clearance: 54.8 mL/min (by C-G formula based on SCr of 0.97 mg/dL). No results for input(s): VANCOTROUGH, VANCOPEAK, VANCORANDOM, GENTTROUGH, GENTPEAK, GENTRANDOM, TOBRATROUGH, TOBRAPEAK, TOBRARND, AMIKACINPEAK, AMIKACINTROU, AMIKACIN in the last 72 hours.   Microbiology:   Medical History: Past Medical History:  Diagnosis Date  . Arthritis    "joints" (10/15/2017)  . Benign prostatic hyperplasia   . Complication of anesthesia    "last OR in 06/2017 affected him cognitively; hasn't got back to baseline yet" (10/15/2017)  . Dementia   . Depression   . GERD (gastroesophageal reflux disease)   . High cholesterol   . Hypertension   . Hypothyroidism   . Renal disease   . Renal disorder   . TIA (transient ischemic attack) early 2000s   Assessment:  ID: WBC WNL 7. Tmax 99.5, currently Afebrile. Zosyn for sepsis/ aspiration (CXR: consolidation/small L effusion). Scr WNL  Zosyn 10/20>>  MRSA PCR: neg Resp cx: GPR  Goal of Therapy:  Eradication of infection   Plan:  Zosyn 3.375g IV q 8 hr. Dose ok down to a CrCl of 20 Pharmacy will sign off. Please reconsult for further dosing assitance.  Kallan Merrick S. Alford Highland, PharmD,  BCPS Clinical Staff Pharmacist Pager [phone]  Eilene Ghazi Stillinger 10/20/2017,11:46 AM

## 2017-10-21 DIAGNOSIS — K222 Esophageal obstruction: Secondary | ICD-10-CM

## 2017-10-21 LAB — CBC
HCT: 29.3 % — ABNORMAL LOW (ref 39.0–52.0)
HEMOGLOBIN: 10 g/dL — AB (ref 13.0–17.0)
MCH: 30.3 pg (ref 26.0–34.0)
MCHC: 34.1 g/dL (ref 30.0–36.0)
MCV: 88.8 fL (ref 78.0–100.0)
PLATELETS: 333 10*3/uL (ref 150–400)
RBC: 3.3 MIL/uL — ABNORMAL LOW (ref 4.22–5.81)
RDW: 13.5 % (ref 11.5–15.5)
WBC: 7.2 10*3/uL (ref 4.0–10.5)

## 2017-10-21 LAB — GLUCOSE, CAPILLARY
GLUCOSE-CAPILLARY: 104 mg/dL — AB (ref 65–99)
GLUCOSE-CAPILLARY: 47 mg/dL — AB (ref 65–99)
Glucose-Capillary: 104 mg/dL — ABNORMAL HIGH (ref 65–99)
Glucose-Capillary: 106 mg/dL — ABNORMAL HIGH (ref 65–99)
Glucose-Capillary: 109 mg/dL — ABNORMAL HIGH (ref 65–99)
Glucose-Capillary: 116 mg/dL — ABNORMAL HIGH (ref 65–99)
Glucose-Capillary: 98 mg/dL (ref 65–99)

## 2017-10-21 NOTE — Progress Notes (Signed)
   Patient Name: Anthony Hayes Date of Encounter: 10/21/2017, 6:24 PM    Subjective  Tol full liquids, ready to go home   Objective  BP 132/62   Pulse (!) 56   Temp 98.1 F (36.7 C) (Oral)   Resp 15   Ht 5' 6"  (1.676 m)   Wt 159 lb (72.1 kg)   SpO2 100%   BMI 25.66 kg/m  Elderly, chronically ill NAD  CBC Latest Ref Rng & Units 10/21/2017 10/20/2017 10/19/2017  WBC 4.0 - 10.5 K/uL 7.2 7.0 8.0  Hemoglobin 13.0 - 17.0 g/dL 10.0(L) 10.2(L) 10.2(L)  Hematocrit 39.0 - 52.0 % 29.3(L) 29.9(L) 29.7(L)  Platelets 150 - 400 K/uL 333 322 310       Assessment and Plan  Mallory weiss Tear w/ hemorrhage and esophageal stricture - overall improved  EGD and hopeful dilation of stx tomorrow The risks and benefits as well as alternatives of endoscopic procedure(s) have been discussed and reviewed. All questions answered. The patient agrees to proceed.    Gatha Mayer, MD, [facility] [phone] (pager) 10/21/2017 6:24 PM

## 2017-10-21 NOTE — Progress Notes (Signed)
Internal Medicine Attending:   I saw and examined the patient. I reviewed the resident's note and I agree with the resident's findings and plan as documented in the resident's note. Feeling well, no chest pain, no difficulty with liquid diet. Frustrated at being in hospital but appreciate for his care. Hgb stable.  Continue Protonix BID and Augmentin. Pending repeat EGD scheduled for tomorrow. NPO at [facility].

## 2017-10-21 NOTE — Progress Notes (Signed)
   Subjective:  Patient seen and examined. He denies chest, epigastric, or abdominal pain this morning. He is tolerating full liquid diet. He is frustrated that the EGD is scheduled tomorrow because he wants to go home. Patient agreeable to stay. No other acute medical complaints this AM.   Objective:  Vital signs in last 24 hours: Vitals:   10/20/17 1158 10/20/17 1638 10/20/17 2100 10/21/17 0437  BP: 123/88 130/66 138/73 (!) 145/72  Pulse: (!) 45 (!) 50 (!) 51 (!) 49  Resp: 18 18 16 17   Temp: 98.3 F (36.8 C) 98 F (36.7 C) 98.5 F (36.9 C) 98.5 F (36.9 C)  TempSrc: Oral Oral Oral Oral  SpO2: 100% 97% 96% 98%  Weight:    159 lb (72.1 kg)  Height:       General: Sitting in chair, NAD HEENT: [place]/AT, EOMI, no scleral icterus Cardiac: RRR, No R/M/G appreciated Pulm: normal effort, CTAB Abd: soft, non tender, non distended, BS normal Ext: extremities well perfused, no peripheral edema Neuro: alert and oriented X3, cranial nerves II-XII grossly intact   Assessment/Plan:  Principal Problem:   UGIB (upper gastrointestinal bleed) Active Problems:   Chest pain   GERD (gastroesophageal reflux disease)   Essential hypertension, benign   Respiratory failure (HCC)   Mallory-Weiss syndrome   Esophageal stricture  Upper GI bleed 2/2 Mallory Weiss Tear, Esophageal ulcers Stable. Patient denies pain, is tolerating a full liquid diet. Hemoglobin stable at 10 today, leukocytosis resolved.  -GI following, appreciate recs -- Repeat EGD 10/26 -40 mg PO protonix BID -Day 6 of antiobiotcs -- Augmentin 875 mg Q12 hours  Normocytic Anemia  2/2 acute blood loss in setting of UGIB. Stable, Hgb 10.  -Holding aspirin -Continue to monitor closely  HTN Stabe, BP 146/48. -Continue PRN Hydralazine if BP >833 systolic or >825 diastolic -Holding home meds because unclear if patient is taking amlodipine 10   Dispo: Anticipated discharge in approximately 1 day.   Melanee Spry,  MD 10/21/2017, 8:43 AM Pager: [phone]

## 2017-10-22 ENCOUNTER — Encounter (HOSPITAL_COMMUNITY)
Admission: EM | Disposition: A | Discharge status: Home Health | Payer: Self-pay | Source: Home / Self Care | Attending: Internal Medicine

## 2017-10-22 ENCOUNTER — Inpatient Hospital Stay (HOSPITAL_COMMUNITY): Payer: Medicare Other | Admitting: Certified Registered Nurse Anesthetist

## 2017-10-22 ENCOUNTER — Encounter (HOSPITAL_COMMUNITY): Payer: Self-pay | Admitting: *Deleted

## 2017-10-22 ENCOUNTER — Encounter: Payer: Self-pay | Admitting: Nurse Practitioner

## 2017-10-22 DIAGNOSIS — D62 Acute posthemorrhagic anemia: Secondary | ICD-10-CM | POA: Diagnosis not present

## 2017-10-22 DIAGNOSIS — D500 Iron deficiency anemia secondary to blood loss (chronic): Secondary | ICD-10-CM | POA: Diagnosis not present

## 2017-10-22 HISTORY — PX: ESOPHAGOGASTRODUODENOSCOPY: SHX5428

## 2017-10-22 LAB — CBC
HCT: 30.3 % — ABNORMAL LOW (ref 39.0–52.0)
HEMOGLOBIN: 10.3 g/dL — AB (ref 13.0–17.0)
MCH: 30.1 pg (ref 26.0–34.0)
MCHC: 34 g/dL (ref 30.0–36.0)
MCV: 88.6 fL (ref 78.0–100.0)
PLATELETS: 344 10*3/uL (ref 150–400)
RBC: 3.42 MIL/uL — AB (ref 4.22–5.81)
RDW: 13.3 % (ref 11.5–15.5)
WBC: 7.3 10*3/uL (ref 4.0–10.5)

## 2017-10-22 LAB — GLUCOSE, CAPILLARY
GLUCOSE-CAPILLARY: 101 mg/dL — AB (ref 65–99)
GLUCOSE-CAPILLARY: 97 mg/dL (ref 65–99)
GLUCOSE-CAPILLARY: 88 mg/dL (ref 65–99)

## 2017-10-22 SURGERY — EGD (ESOPHAGOGASTRODUODENOSCOPY)
Anesthesia: General

## 2017-10-22 MED ORDER — PROPOFOL 10 MG/ML IV BOLUS
INTRAVENOUS | Status: DC | PRN
  Administered 2017-10-22 (×2): 20 mg via INTRAVENOUS

## 2017-10-22 MED ORDER — LACTATED RINGERS IV SOLN
INTRAVENOUS | Status: DC | PRN
  Administered 2017-10-22: 10:00:00 via INTRAVENOUS

## 2017-10-22 MED ORDER — PANTOPRAZOLE SODIUM 40 MG PO TBEC: 40.0000 mg | DELAYED_RELEASE_TABLET | Freq: Two times a day (BID) | ORAL | 0 refills | Status: DC

## 2017-10-22 MED ORDER — PROPOFOL 500 MG/50ML IV EMUL
INTRAVENOUS | Status: DC | PRN
  Administered 2017-10-22: 75 ug/kg/min via INTRAVENOUS

## 2017-10-22 NOTE — Interval H&P Note (Signed)
History and Physical Interval Note:  10/22/2017 10:02 AM  Anthony Hayes  has presented today for surgery, with the diagnosis of mallory weiss tear  The various methods of treatment have been discussed with the patient and family. After consideration of risks, benefits and other options for treatment, the patient has consented to  Procedure(s): ESOPHAGOGASTRODUODENOSCOPY (EGD) (N/A) as a surgical intervention .  The patient's history has been reviewed, patient examined, no change in status, stable for surgery.  I have reviewed the patient's chart and labs.  Questions were answered to the patient's satisfaction.     Silvano Rusk

## 2017-10-22 NOTE — Progress Notes (Signed)
Patient will discharge back to [facility]. Anticipated discharge date: 10/22/17 Family notified: [name], daughter Transportation by: daughter will drive  Nurse to call report to [phone].   CSW signing off.  Estanislado Emms, [place]  Clinical Social Worker

## 2017-10-22 NOTE — Anesthesia Preprocedure Evaluation (Addendum)
Anesthesia Evaluation  Patient identified by MRN, date of birth, ID band Patient awake    Reviewed: Allergy & Precautions, NPO status , Patient's Chart, lab work & pertinent test results  History of Anesthesia Complications Negative for: history of anesthetic complications  Airway Mallampati: III  TM Distance: >3 FB Neck ROM: Full    Dental  (+) Caps, Teeth Intact, Dental Advisory Given   Pulmonary neg pulmonary ROS, former smoker,    Pulmonary exam normal        Cardiovascular hypertension, Normal cardiovascular exam     Neuro/Psych PSYCHIATRIC DISORDERS Depression Dementia TIA   GI/Hepatic Neg liver ROS, GERD  ,  Endo/Other  negative endocrine ROSHypothyroidism   Renal/GU   negative genitourinary   Musculoskeletal negative musculoskeletal ROS (+)   Abdominal   Peds negative pediatric ROS (+)  Hematology negative hematology ROS (+)   Anesthesia Other Findings   Reproductive/Obstetrics negative OB ROS                             Anesthesia Physical  Anesthesia Plan  ASA: III and emergent  Anesthesia Plan: MAC   Post-op Pain Management:    Induction:   PONV Risk Score and Plan: 2 and Ondansetron, Treatment may vary due to age or medical condition and Propofol infusion  Airway Management Planned: Natural Airway  Additional Equipment:   Intra-op Plan:   Post-operative Plan:   Informed Consent: I have reviewed the patients History and Physical, chart, labs and discussed the procedure including the risks, benefits and alternatives for the proposed anesthesia with the patient or authorized representative who has indicated his/her understanding and acceptance.   Dental advisory given  Plan Discussed with: CRNA, Anesthesiologist and Surgeon  Anesthesia Plan Comments:        Anesthesia Quick Evaluation

## 2017-10-22 NOTE — Discharge Summary (Signed)
Name: Anthony Hayes MRN: [id number] DOB: 1935/11/25 82 y.o. PCP: System, Pcp Not In  Date of Admission: 10/15/2017 10:15 AM Date of Discharge: 10/22/2017 Attending Physician: Lucious Groves, DO  Discharge Diagnosis: 1. Upper Gastrointestinal Bleed Principal Problem:   UGIB (upper gastrointestinal bleed) Active Problems:   Chest pain   GERD (gastroesophageal reflux disease)   Essential hypertension, benign   Respiratory failure (HCC)   Mallory-Weiss syndrome   Esophageal stricture   Discharge Medications: Allergies as of 10/22/2017      Reactions   Lisinopril Swelling      Medication List    STOP taking these medications   ranitidine 150 MG tablet Commonly known as:  ZANTAC   sertraline 50 MG tablet Commonly known as:  ZOLOFT     TAKE these medications   aspirin EC 81 MG tablet Take 81 mg by mouth daily.   imipramine 25 MG tablet Commonly known as:  TOFRANIL Take 25 mg by mouth at bedtime.   levothyroxine 50 MCG tablet Commonly known as:  SYNTHROID, LEVOTHROID Take 25 mcg by mouth every morning.   oxybutynin 5 MG tablet Commonly known as:  DITROPAN Take 5 mg by mouth every morning.   pantoprazole 40 MG tablet Commonly known as:  PROTONIX Take 1 tablet (40 mg total) by mouth 2 (two) times daily.       Disposition and follow-up:   [name] was discharged from [facility] in Good condition.  At the hospital follow up visit please address:  1.   Upper GI Bleed -Soft foods today -Can resume regular diet on 10/27 -Patient should remain on Protonix 40 mg twice daily until follow up with GI -Follow up with GI on 11/13  -Speech therapy at ALF   Normocytic anemia -Repeat CBC  -Continue PT/OT at ALF   Depression -Discontinue Zoloft at request of patient's daughter   2.  Labs / imaging needed at time of follow-up: CBC  3.  Pending labs/ test needing follow-up: None   Follow-up Appointments: Follow-up Information    Home,  [facility] Follow up.   Specialty:  Home Health Services Why:  Physical/Occupational Therapies, Registered Nurse, Speech Therapy.  Contact information: [address] [place] [address] [phone]           Hospital Course by problem list: Principal Problem:   UGIB (upper gastrointestinal bleed) Active Problems:   Chest pain   GERD (gastroesophageal reflux disease)   Essential hypertension, benign   Respiratory failure (HCC)   Mallory-Weiss syndrome   Esophageal stricture   1. Upper Gastrointestinal Bleed [name] was admitted to [facility] and the Internal Medicine Teaching Service for chest pain, epigastric pain, and bloody emesis. In the emergency department, the patient was hemodynamically stable, afebrile, and saturating well on room air. EKG showed sinus bradycardia with intraventricular conduction delay and prolonged QT. No evidence of ischemic changes. I-stat troponin was negative. Initial labs were only significant for mildly elevated WBC 12.8. Hemoglobin was stable at 13.6, Creatinine 1.02. The patient's chest pain and epigastric pain acutely worsening in the emergency department, so Cardiology was consulted. They evaluated the patient in the ED and cardiology consult was obtained in the emergency department who recommended serial cardiac enzymes but suspected his pain was GI related. The night of admission the patient had numerous episodes of small volume bloody emesis and was started on IV PPI. He remained hemodynamically stable over night and Gastroenterology was consulted the following morning.  A CT scan of his chest and abdomden were performed. Ct scan of the chest revealed findings consistent with severe esophagitis. The patient was placed on broad spectrum antibiotics and was scheduled for emergent EGD. He was placed under general anesthesia for the procedure for airway protection. EGD revealed blood clot filled esophagus,  severe ulcerative  esophagitis with benign appearing peptic stricture, and a long, deep (but not perforated) mucosal tear in the distal esophagus. He remained intubated following the procedure, was transferred to the ICU, extubated successfully the following day and placed on a PPI drip. The patient was maintained on a liquid diet and tolerated it well. Hemoglobin remained stable and the patient did not experience any further pain or bloody emesis. A repeat EGD was performed on the day of discharge. It showed benign-appearing esophageal stenosis, which was successfully dilated (no biopsies were taken); non-bleeding esophageal ulcer that is healing; a small hiatal hernia and multiple benign fundic gland polyps. Patient did not require intubation for the second EGD procedure.    2. Normocytic Anemia  Patient's hemoglobin on admission was 13.5 and decreased to a nadir of 9 following upper GI bleed. His hemoglobin stabilized at 10. He was asymptomatic. He had no further episodes of bleeding. His daily aspirin was held during admission, but will be resumed on discharge.  Discharge Vitals:   BP (!) 147/61   Pulse (!) 45   Temp 98.2 F (36.8 C)   Resp 18   Ht 5' 6"  (1.676 m)   Wt 152 lb (68.9 kg)   SpO2 100%   BMI 24.53 kg/m   Pertinent Labs, Studies, and Procedures:  CBC Latest Ref Rng & Units 10/22/2017 10/21/2017 10/20/2017  WBC 4.0 - 10.5 K/uL 7.3 7.2 7.0  Hemoglobin 13.0 - 17.0 g/dL 10.3(L) 10.0(L) 10.2(L)  Hematocrit 39.0 - 52.0 % 30.3(L) 29.3(L) 29.9(L)  Platelets 150 - 400 K/uL 344 333 322   BMP Latest Ref Rng & Units 10/20/2017 10/19/2017 10/18/2017  Glucose 65 - 99 mg/dL 110(H) 105(H) 107(H)  BUN 6 - 20 mg/dL 9 10 14   Creatinine 0.61 - 1.24 mg/dL 0.97 1.04 1.05  Sodium 135 - 145 mmol/L 138 137 135  Potassium 3.5 - 5.1 mmol/L 3.5 3.6 3.4(L)  Chloride 101 - 111 mmol/L 105 104 105  CO2 22 - 32 mmol/L 26 24 26   Calcium 8.9 - 10.3 mg/dL 9.2 9.2 8.7(L)   CT Chest and Abdomen with Contrast  10/202018 IMPRESSION: Severe circumferential thickening of the entire esophagus which is mildly dilated. Retained food or blood is present within the esophagus. In addition, there is extraluminal fluid around the distal esophagus on the left. No extraluminal gas. Findings most likely due to severe esophagitis. Underlying tumor not excluded however no focal mass lesion identified. Esophageal stricture cannot be excluded based on this study. Esophageal rupture is possible however no extraluminal gas is seen to suggest this diagnosis.  Small bilateral pleural effusions with bibasilar atelectasis  Stranding in the omentum in the right pelvis likely due to acute or chronic fat necrosis and epiploic appendage.  Upper Endoscopy 10/16/2017 Findings: The stomach was normal The duodenum was normal. - Severe ulcerative esophagitis with benign appearing peptic stricture and a long, deep (but not perforated) mucosal tear in the distal esphagus - What appeared to be very thickened esophagus was actually a large lumen filling blood clot.  Upper Endoscopy 10/22/2017 Findings: One linear esophageal ulcer with no bleeding and no stigmata of recent bleeding was found in the lower third of  the esophagus. The lesion was 5 mm in largest dimension. A small hiatal hernia was present. Multiple diminutive semi-sessile polyps with no stigmata of recent bleeding were found in the gastric fundus and in the gastric body. A small amount of food (residue) was found in the gastric body. The cardia and gastric fundus were normal on retroflexion. The exam was otherwise without abnormality. - Benign-appearing esophageal stenosis. Dilated. - Non-bleeding esophageal ulcer. Healing - caused by retching/vomiting last weekend we think. No stigmata - Small hiatal hernia. - Multiple gastric polyps. Diminutive and classic appearance of benign fundic gland polyps - A small amount of food (residue) in the stomach. - The  examination was otherwise normal. - No specimens collected.  Discharge Instructions: Discharge Instructions    Call MD for:  persistant nausea and vomiting    Complete by:  As directed    Call MD for:  severe uncontrolled pain    Complete by:  As directed    Diet - low sodium heart healthy    Complete by:  As directed    Discharge instructions    Complete by:  As directed    Mr. Dmarius, Reeder were admitted for an Upper Gastrointestinal Bleed. You were found to have a large tear in you distal esophagus thought to be caused an episode of vomiting. You responded very well to antibiotics and anti-acid medications. The second upper endoscopy (EGD) showed that the tear in your esophagus was healing. The gastroenterologist also successfully dilated the narrowing of your esophagus. Please eat soft foods for the remainder of the day. You can return to a normal diet tomorrow. You have been prescribed Protonix (Pantoprazole) 40 mg. Take this medications twice daily until you follow up with the gastroenterologists. Do not take the ranitidine for GERD. Please follow up with the GI doctors on November 13th.   I discontinued Zoloft. Otherwise, continue taking your other medicines as you were prior to admission.   Please call the internal medicine clinic and make an appointment for 1 week following your discharge.   Increase activity slowly    Complete by:  As directed       Signed: Melanee Spry, MD 10/22/2017, 12:56 PM   Pager: [phone]

## 2017-10-22 NOTE — Op Note (Addendum)
[facility] Patient Name: Anthony Hayes Procedure Date : 10/22/2017 MRN: [id number] Attending MD: Gatha Mayer , MD Date of Birth: May 24, 1935 CSN: [id number] Age: 82 Admit Type: Inpatient Procedure:                Upper GI endoscopy Indications:              Dysphagia, Stricture of the esophagus, For therapy                            of esophageal stricture Providers:                Gatha Mayer, MD, Cleda Daub, RN, Corliss Parish, Technician Referring MD:              Medicines:                Propofol per Anesthesia, Monitored Anesthesia Care Complications:            No immediate complications. Estimated Blood Loss:     Estimated blood loss was minimal. Procedure:                Pre-Anesthesia Assessment:                           - Prior to the procedure, a History and Physical                            was performed, and patient medications and                            allergies were reviewed. The patient's tolerance of                            previous anesthesia was also reviewed. The risks                            and benefits of the procedure and the sedation                            options and risks were discussed with the patient.                            All questions were answered, and informed consent                            was obtained. Prior Anticoagulants: The patient has                            taken no previous anticoagulant or antiplatelet                            agents. ASA Grade Assessment: III - A patient with  severe systemic disease. After reviewing the risks                            and benefits, the patient was deemed in                            satisfactory condition to undergo the procedure.                           After obtaining informed consent, the endoscope was                            passed under direct vision. Throughout the          procedure, the patient's blood pressure, pulse, and                            oxygen saturations were monitored continuously. The                            EG-[id number] ([id number]) scope was introduced through the                            mouth, and advanced to the second part of duodenum.                            The upper GI endoscopy was accomplished without                            difficulty. The patient tolerated the procedure                            well. Scope In: Scope Out: Findings:      One moderate benign-appearing, intrinsic stenosis was found at the       gastroesophageal junction. This measured 1.4 cm (inner diameter) x less       than one cm (in length) and was traversed. A TTS dilator was passed       through the scope. Dilation with a 15-16.5-18 mm balloon dilator was       performed to 18 mm. The dilation site was examined and showed moderate       improvement in luminal narrowing. Estimated blood loss was minimal.      One linear esophageal ulcer with no bleeding and no stigmata of recent       bleeding was found in the lower third of the esophagus. The lesion was 5       mm in largest dimension.      A small hiatal hernia was present.      Multiple diminutive semi-sessile polyps with no stigmata of recent       bleeding were found in the gastric fundus and in the gastric body.      A small amount of food (residue) was found in the gastric body.      The cardia and gastric fundus were normal on retroflexion.      The exam was otherwise without abnormality. Impression:               -  Benign-appearing esophageal stenosis. Dilated.                           - Non-bleeding esophageal ulcer. Healing - caused                            by retching/vomiting last weekend we think. No                            stigmata                           - Small hiatal hernia.                           - Multiple gastric polyps. Diminutive and classic                             appearance of benign fundic gland polyps                           - A small amount of food (residue) in the stomach.                           - The examination was otherwise normal.                           - No specimens collected. Moderate Sedation:      Please see anesthesia notes, moderate sedation not given Recommendation:           - Patient has a contact number available for                            emergencies. The signs and symptoms of potential                            delayed complications were discussed with the                            patient. Return to normal activities tomorrow.                            Written discharge instructions were provided to the                            patient.                           - Clear liquids x 1 hour then soft foods rest of                            day. Start prior diet tomorrow.                           - Continue present medications.                           -  Follow an antireflux regimen.                           - Use Protonix (pantoprazole) 40 mg PO BID.                           - Return to nurse practitioner Tye Savoy in                            our office Nov 09, 2017 10 AM. DC home today. Procedure Code(s):        --- Professional ---                           [phone], Esophagogastroduodenoscopy, flexible,                            transoral; with transendoscopic balloon dilation of                            esophagus (less than 30 mm diameter) Diagnosis Code(s):        --- Professional ---                           K22.2, Esophageal obstruction                           K22.10, Ulcer of esophagus without bleeding                           K44.9, Diaphragmatic hernia without obstruction or                            gangrene                           R13.10, Dysphagia, unspecified CPT copyright 2016 [employer]. All rights reserved. The codes documented in this report are  preliminary and upon coder review may  be revised to meet current compliance requirements. Gatha Mayer, MD 10/22/2017 10:57:46 AM This report has been signed electronically. Number of Addenda: 0

## 2017-10-22 NOTE — Anesthesia Postprocedure Evaluation (Addendum)
Anesthesia Post Note  Patient: Anthony Hayes  Procedure(s) Performed: ESOPHAGOGASTRODUODENOSCOPY (EGD) (N/A )     Patient location during evaluation: Endoscopy Anesthesia Type: General Level of consciousness: sedated Pain management: pain level controlled Vital Signs Assessment: post-procedure vital signs reviewed and stable Respiratory status: spontaneous breathing and respiratory function stable Cardiovascular status: stable Postop Assessment: no apparent nausea or vomiting Anesthetic complications: no    Last Vitals:  Vitals:   10/22/17 1050 10/22/17 1100  BP: (!) 131/59 (!) 147/61  Pulse: (!) 44 (!) 45  Resp: 14 18  Temp:    SpO2: 96% 100%    Last Pain:  Vitals:   10/22/17 1048  TempSrc: Oral  PainSc:                  [name] DANIEL

## 2017-10-22 NOTE — Progress Notes (Signed)
Physical Therapy Treatment Patient Details Name: Anthony Hayes MRN: [id number] DOB: 10-31-1935 Today's Date: 10/22/2017    History of Present Illness Presents to ED on 10/19 with atypical chest pain. Cardiology consulted who believes it was GI related. Overnight patient had hematemesis. Went for Endoscopy on 10/20, revealed ulcerative esophagitis with deep mucosal tear and stricture. Patient remained intubated for the procedure and remained on mechanical ventilation and transferred to ICU.     PT Comments    Patient progressing well towards PT goals. Continues to be slightly confused about admission. Also with poor memory. Tolerated gait training with Min A-Min guard assist for safety. Cues for upright posture and RW proximity. Pt high fall risk due to being distracted and having impaired safety awareness. HR continues to be low ranging from high 40s-55 bpm during activity. Session cut short due to pt going down for EGD. Will continue to follow.   Follow Up Recommendations  Home health PT;Supervision - Intermittent     Equipment Recommendations  None recommended by PT    Recommendations for Other Services       Precautions / Restrictions Precautions Precautions: Fall Precaution Comments: bradycardia Restrictions Weight Bearing Restrictions: No    Mobility  Bed Mobility Overal bed mobility: Needs Assistance Bed Mobility: Supine to Sit     Supine to sit: Min guard;HOB elevated     General bed mobility comments: Able to get to EOB with assist of rail and increased time.   Transfers Overall transfer level: Needs assistance Equipment used: Rolling walker (2 wheeled) Transfers: Sit to/from Stand Sit to Stand: Min guard         General transfer comment: Min guard to steady in standing. Stood from [employer]. Transferred to transport chair post ambulation to go down for test.   Ambulation/Gait Ambulation/Gait assistance: Min assist;Min guard Ambulation Distance (Feet): 275  Feet Assistive device: Rolling walker (2 wheeled) Gait Pattern/deviations: Step-through pattern;Decreased stride length;Trunk flexed;Drifts right/left;Shuffle Gait velocity: decreased   General Gait Details: Pt with slightly flexed posture. Cues for RW proximity and upright posture. HR ranged from high 40s-55 bpm.    Stairs            Wheelchair Mobility    Modified Rankin (Stroke Patients Only)       Balance Overall balance assessment: Needs assistance Sitting-balance support: Feet supported;No upper extremity supported Sitting balance-[name] Scale: Fair Sitting balance - Comments: total A to donn socks.   Standing balance support: During functional activity;Bilateral upper extremity supported Standing balance-[name] Scale: Poor Standing balance comment: relies on UE support for balance                            Cognition Arousal/Alertness: Awake/alert Behavior During Therapy: WFL for tasks assessed/performed Overall Cognitive Status: Impaired/Different from baseline Area of Impairment: Memory                               General Comments: WFL for basic mobility tasks however does not recall why he is in the hospital.      Exercises      General Comments        Pertinent Vitals/Pain Pain Assessment: No/denies pain    Home Living                      Prior Function            PT  Goals (current goals can now be found in the care plan section) Progress towards PT goals: Progressing toward goals    Frequency    Min 3X/week      PT Plan Current plan remains appropriate    Co-evaluation              AM-PAC PT "6 Clicks" Daily Activity  Outcome Measure  Difficulty turning over in bed (including adjusting bedclothes, sheets and blankets)?: None Difficulty moving from lying on back to sitting on the side of the bed? : None Difficulty sitting down on and standing up from a chair with arms (e.g., wheelchair,  bedside commode, etc,.)?: None Help needed moving to and from a bed to chair (including a wheelchair)?: A Little Help needed walking in hospital room?: A Little Help needed climbing 3-5 steps with a railing? : A Lot 6 Click Score: 20    End of Session Equipment Utilized During Treatment: Gait belt Activity Tolerance: Patient tolerated treatment well Patient left: in chair;with call bell/phone within reach (with transport) Nurse Communication: Mobility status PT Visit Diagnosis: Unsteadiness on feet (R26.81);Muscle weakness (generalized) (M62.81)     Time: 0511-0211 PT Time Calculation (min) (ACUTE ONLY): 17 min  Charges:  $Gait Training: 8-22 mins                    G Codes:       Wray Kearns, PT, DPT [phone]     Marguarite Arbour A [place] 10/22/2017, 9:31 AM

## 2017-10-22 NOTE — H&P (View-Only) (Signed)
   Patient Name: Anthony Hayes Date of Encounter: 10/21/2017, 6:24 PM    Subjective  Tol full liquids, ready to go home   Objective  BP 132/62   Pulse (!) 56   Temp 98.1 F (36.7 C) (Oral)   Resp 15   Ht 5' 6"  (1.676 m)   Wt 159 lb (72.1 kg)   SpO2 100%   BMI 25.66 kg/m  Elderly, chronically ill NAD  CBC Latest Ref Rng & Units 10/21/2017 10/20/2017 10/19/2017  WBC 4.0 - 10.5 K/uL 7.2 7.0 8.0  Hemoglobin 13.0 - 17.0 g/dL 10.0(L) 10.2(L) 10.2(L)  Hematocrit 39.0 - 52.0 % 29.3(L) 29.9(L) 29.7(L)  Platelets 150 - 400 K/uL 333 322 310       Assessment and Plan  Mallory weiss Tear w/ hemorrhage and esophageal stricture - overall improved  EGD and hopeful dilation of stx tomorrow The risks and benefits as well as alternatives of endoscopic procedure(s) have been discussed and reviewed. All questions answered. The patient agrees to proceed.    Gatha Mayer, MD, [facility] [phone] (pager) 10/21/2017 6:24 PM

## 2017-10-22 NOTE — Transfer of Care (Signed)
Immediate Anesthesia Transfer of Care Note  Patient: Anthony Hayes  Procedure(s) Performed: ESOPHAGOGASTRODUODENOSCOPY (EGD) (N/A )  Patient Location: PACU  Anesthesia Type:MAC  Level of Consciousness: awake and alert   Airway & Oxygen Therapy: Patient Spontanous Breathing  Post-op Assessment: Report given to RN and Post -op Vital signs reviewed and stable  Post vital signs: Reviewed and stable  Last Vitals:  Vitals:   10/22/17 0753 10/22/17 0921  BP: (!) 148/72 (!) 157/60  Pulse:  (!) 46  Resp:  17  Temp: 36.7 C 36.5 C  SpO2:  99%    Last Pain:  Vitals:   10/22/17 0921  TempSrc: Oral  PainSc:       Patients Stated Pain Goal: 0 (28/36/62 9476)  Complications: No apparent anesthesia complications

## 2017-10-22 NOTE — Progress Notes (Signed)
.     Subjective:  Patient tolerated the EGD well. He denies chest pain or abdominal pain. He is agreeable to discharge today.   Objective:  Vital signs in last 24 hours: Vitals:   10/22/17 0921 10/22/17 1048 10/22/17 1050 10/22/17 1100  BP: (!) 157/60 (!) 131/59 (!) 131/59 (!) 147/61  Pulse: (!) 46 (!) 44 (!) 44 (!) 45  Resp: 17 (!) 22 14 18   Temp: 97.7 F (36.5 C) 98 F (36.7 C)    TempSrc: Oral Oral    SpO2: 99% 95% 96% 100%  Weight:      Height:       General: Laying in bed comfortably, NAD HEENT: [place]/AT, EOMI, no scleral icterus Cardiac: RRR, No R/M/G appreciated Pulm: normal effort, CTAB Abd: soft, non tender, non distended, BS normal Ext: extremities well perfused, no peripheral edema Neuro: alert and oriented X3, cranial nerves II-XII grossly intact   Assessment/Plan:  Principal Problem:   UGIB (upper gastrointestinal bleed) Active Problems:   Chest pain   GERD (gastroesophageal reflux disease)   Essential hypertension, benign   Respiratory failure (HCC)   Mallory-Weiss syndrome   Esophageal stricture  Upper GI bleed 2/2 Mallory Weiss Tear, Esophageal ulcers Stable, tolerated EGD well. Hemoglobin stable at 10.3 today. EGD revealed benign-appearing esophageal stenosis>>dilated (no specimens collected); Non-bleeding esophageal ulcer healing; Small hiatal hernia, Multiple benign fundic gland polyps. -GI s/o -- follow up appointment scheduled for 11/13 -Soft diet after d/c today, regular diet starting tomorrow  -40 mg PO protonix BID -- will d/c with prescription  -Stopping abx  Normocytic Anemia  2/2 acute blood loss in setting of UGIB. Stable, Hgb 10.3.  -Continue aspirin at d/c   Dispo: Anticipated discharge in approximately 1 day.   Melanee Spry, MD 10/22/2017, 12:03 PM Pager: [phone]

## 2017-10-22 NOTE — Progress Notes (Signed)
Report given to Doddie at [facility].  Discharge instruction given to his daughter.    Idolina Primer, RN

## 2017-10-23 ENCOUNTER — Encounter (HOSPITAL_COMMUNITY): Payer: Self-pay | Admitting: Internal Medicine

## 2017-10-26 NOTE — Addendum Note (Signed)
Addendum  created 10/26/17 1822 by Duane Boston, MD   Sign clinical note

## 2017-11-09 ENCOUNTER — Encounter: Payer: Self-pay | Admitting: Nurse Practitioner

## 2017-11-09 ENCOUNTER — Ambulatory Visit (INDEPENDENT_AMBULATORY_CARE_PROVIDER_SITE_OTHER): Payer: Medicare Other | Admitting: Nurse Practitioner

## 2017-11-09 VITALS — BP 146/72 | HR 67 | Ht 66.0 in | Wt 166.0 lb

## 2017-11-09 DIAGNOSIS — K922 Gastrointestinal hemorrhage, unspecified: Secondary | ICD-10-CM

## 2017-11-09 DIAGNOSIS — K226 Gastro-esophageal laceration-hemorrhage syndrome: Secondary | ICD-10-CM | POA: Diagnosis not present

## 2017-11-09 DIAGNOSIS — K209 Esophagitis, unspecified without bleeding: Secondary | ICD-10-CM

## 2017-11-09 DIAGNOSIS — R159 Full incontinence of feces: Secondary | ICD-10-CM

## 2017-11-09 NOTE — Progress Notes (Signed)
HPI:  Patient is an 82 year old male who we recently saw in the hospital for an upper GI bleed.  He presented to the emergency department with hematemesis and epigastric/chest pain.  Cardiac evaluation was negative and cardiac felt chest pain was not cardiac in nature.  Chest CT showed severe diffuse thickening of the entire esophagus  Inpatient EGD was done.  The esophagus was filled with a blood clot.  It took 45 minutes to evacuate the clot and be able to visualize the esophageal mucosa.  A benign-appearing GE junction stricture with ulcerative esophagitis was found.  Proximal to the stricture was a deep mucosal tear that was 5 cm long and once he had no evidence for perforation.  There were a few blood clots adhering to the tear and some pinpoint oozing. A few days later repeat EGD was performed.  The esophageal ulcer caused by retching and vomiting proved to be healing.  A small hiatal hernia was found.  Multiple benign-appearing gastric polyps were seen.  The benign-appearing esophageal stenosis was dilated.   Patient is here for a hospital follow-up.  No further nausea or vomiting.  Appetite is okay.  No heartburn.  He is taking PPI twice daily. Bowel movements are okay, he does have some incontinence.  Stool consistency is mushy   Past Medical History:  Diagnosis Date  . Arthritis    "joints" (10/15/2017)  . Benign prostatic hyperplasia   . Complication of anesthesia    "last OR in 06/2017 affected him cognitively; hasn't got back to baseline yet" (10/15/2017)  . Dementia   . Depression   . GERD (gastroesophageal reflux disease)   . High cholesterol   . Hypertension   . Hypothyroidism   . Renal disease   . Renal disorder   . TIA (transient ischemic attack) early 2000s     Past Surgical History:  Procedure Laterality Date  . ABDOMINAL EXPLORATION SURGERY  7/  . APPENDECTOMY    . CATARACT EXTRACTION W/ INTRAOCULAR LENS  IMPLANT, BILATERAL Bilateral   . COLECTOMY  1950s     "thought he had iteilis"  . FEMUR FRACTURE SURGERY Right 1950s  . FRACTURE SURGERY    . KNEE ARTHROSCOPY Left   . POSTERIOR LUMBAR FUSION    . ROTATOR CUFF REPAIR Left   . TONSILLECTOMY     History reviewed. No pertinent family history. Social History   Tobacco Use  . Smoking status: Former Smoker    Years: 7.00    Types: Cigarettes    Last attempt to quit: 1963    Years since quitting: 55.9  . Smokeless tobacco: Never Used  Substance Use Topics  . Alcohol use: Yes    Comment: 10/15/2017 "a few drinks/year"  . Drug use: No   Current Outpatient Medications  Medication Sig Dispense Refill  . aspirin EC 81 MG tablet Take 81 mg by mouth daily.    Marland Kitchen imipramine (TOFRANIL) 25 MG tablet Take 25 mg by mouth at bedtime.    Marland Kitchen levothyroxine (SYNTHROID, LEVOTHROID) 50 MCG tablet Take 25 mcg by mouth every morning.     Marland Kitchen oxybutynin (DITROPAN) 5 MG tablet Take 5 mg by mouth every morning.    . pantoprazole (PROTONIX) 40 MG tablet Take 1 tablet (40 mg total) by mouth 2 (two) times daily. 60 tablet 0   No current facility-administered medications for this visit.    Allergies  Allergen Reactions  . Lisinopril Swelling     Review of Systems: All  systems reviewed and negative except where noted in HPI.    Physical Exam: BP (!) 146/72   Pulse 67   Ht 5' 6"  (1.676 m)   Wt 166 lb (75.3 kg)   BMI 26.79 kg/m   Constitutional:  Well-developed, white male in a wheelchair in no acute distress. Psychiatric: Normal mood and affect. Behavior is normal. EENT: Pupils normal.  Conjunctivae are normal. No scleral icterus. Neck supple.  Cardiovascular: Normal rate, regular rhythm. No edema Pulmonary/chest: Effort normal and breath sounds normal. No wheezing, rales or rhonchi. Abdominal: Soft, nondistended. Nontender. Bowel sounds active throughout. Neurological: Alert and oriented to person place and time. Skin: Skin is warm and dry. No rashes noted.   ASSESSMENT AND PLAN:  1. 82 year  male recently hospitalized with upper GI bleed  / Mallory-Weiss tear / ulcerative esophagitis. No transfusion required, hgb actually stayed around baseline of low 10 range. No further bleeding.  -Continue PPI twice daily through November then can decrease to once daily  2. Esophageal stricture s/p recent dilation. Incidental finding on EGD down for hematemesis. No dysphagia prior to EGD, none now either.   Tye Savoy, NP  11/09/2017, 10:39 AM

## 2017-11-09 NOTE — Patient Instructions (Addendum)
If you are age 80 or older, your body mass index should be between 23-30. Your Body mass index is 26.79 kg/m. If this is out of the aforementioned range listed, please consider follow up with your Primary Care Provider.  If you are age 89 or younger, your body mass index should be between 19-25. Your Body mass index is 26.79 kg/m. If this is out of the aformentioned range listed, please consider follow up with your Primary Care Provider.   Continue Protonix twice daily through November, then once daily.  Follow up as needed.  Thank you for choosing me and [facility].   Tye Savoy, NP

## 2017-11-11 ENCOUNTER — Encounter: Payer: Self-pay | Admitting: Nurse Practitioner

## 2017-11-11 NOTE — Progress Notes (Signed)
I agree with the above note, plan.   Thanks

## 2017-11-22 ENCOUNTER — Telehealth: Payer: Self-pay

## 2017-11-22 NOTE — Telephone Encounter (Signed)
Left a message to call back to let us know if the bowel issues are improved.

## 2018-01-21 ENCOUNTER — Encounter: Payer: Self-pay | Admitting: Emergency Medicine

## 2018-01-21 ENCOUNTER — Ambulatory Visit: Payer: Self-pay | Admitting: Emergency Medicine

## 2018-01-21 VITALS — BP 160/85 | HR 57 | Temp 98.5°F | Resp 16

## 2018-01-21 DIAGNOSIS — N200 Calculus of kidney: Secondary | ICD-10-CM | POA: Insufficient documentation

## 2018-01-21 DIAGNOSIS — N390 Urinary tract infection, site not specified: Secondary | ICD-10-CM

## 2018-01-21 LAB — POCT URINALYSIS DIPSTICK
BILIRUBIN UA: NEGATIVE
Blood, UA: NEGATIVE
Glucose, UA: NEGATIVE
KETONES UA: NEGATIVE
NITRITE UA: NEGATIVE
SPEC GRAV UA: 1.02 (ref 1.010–1.025)
Urobilinogen, UA: 1 E.U./dL
pH, UA: 6.5 (ref 5.0–8.0)

## 2018-01-21 MED ORDER — TAMSULOSIN HCL 0.4 MG PO CAPS: 0.4000 mg | ORAL_CAPSULE | Freq: Every day | ORAL | 0 refills | Status: DC

## 2018-01-21 MED ORDER — CEPHALEXIN 500 MG PO CAPS: 500.0000 mg | ORAL_CAPSULE | Freq: Four times a day (QID) | ORAL | 0 refills | Status: DC

## 2018-01-21 NOTE — Progress Notes (Signed)
S: 83 year old male presents to clinic in care of his daughter with a chief complaint of possible UTI. Patient has past history of UTI, last infection 06/29/2017. Daughter states he has been increasingly confused. He is a resident of a long term care facility, incontinent of urine and wears briefs. Daughter states he has had a low grade fever today between 99-100. Denies N/V/D, flank pain, or difficulty with urination. Otherwise HPI is unremarkable. He does have past history of confusion  ROS: otherwise negative  O: Vitals:   01/21/18 2015  BP: (!) 160/85  Pulse: (!) 57  Resp: 16  Temp: 98.5 F (36.9 C)  SpO2: 97%   Results for orders placed or performed in visit on 01/21/18  POCT urinalysis dipstick  Result Value Ref Range   Color, UA Yellow    Clarity, UA Clear    Glucose, UA NEG    Bilirubin, UA NEG    Ketones, UA NEG    Spec Grav, UA 1.020 1.010 - 1.025   Blood, UA NEG    pH, UA 6.5 5.0 - 8.0   Protein, UA ++    Urobilinogen, UA 1.0 0.2 or 1.0 E.U./dL   Nitrite, UA NEG    Leukocytes, UA Trace (A) Negative   Appearance     Odor     Physical Exam  Constitutional: He is well-developed, well-nourished, and in no distress. No distress.  HENT:  Head: Normocephalic.  Cardiovascular: Normal rate and regular rhythm.  Pulmonary/Chest: Effort normal and breath sounds normal.  Abdominal: Soft. There is no tenderness.  No CVA tenderness  Neurological: He is alert.  Alert to person and place but not time. Per daughter, this is normal mental status  Skin: Skin is warm and dry. He is not diaphoretic.  Nursing note and vitals reviewed.  P:  UA consistent with UA done at his PCP on 06/29/2017, urine culture from that visit grew out E Coli. It is possible that this could be a UTI, it was explained that there can be other causes of confusion in the elderly and recommend going to the ER if he has stroke like symptoms, or worsening symptoms to evaluate for other conditions. Will empirically  treat with keflex and Flomax, follow up with PCP in 1 week, or sooner as needed.

## 2018-01-21 NOTE — Patient Instructions (Signed)
Urinary Tract Infection, Adult °A urinary tract infection (UTI) is an infection of any part of the urinary tract. The urinary tract includes the: °· Kidneys. °· Ureters. °· Bladder. °· Urethra. ° °These organs make, store, and get rid of pee (urine) in the body. °Follow these instructions at home: °· Take over-the-counter and prescription medicines only as told by your doctor. °· If you were prescribed an antibiotic medicine, take it as told by your doctor. Do not stop taking the antibiotic even if you start to feel better. °· Avoid the following drinks: °? Alcohol. °? Caffeine. °? Tea. °? Carbonated drinks. °· Drink enough fluid to keep your pee clear or pale yellow. °· Keep all follow-up visits as told by your doctor. This is important. °· Make sure to: °? Empty your bladder often and completely. Do not to hold pee for long periods of time. °? Empty your bladder before and after sex. °? Wipe from front to back after a bowel movement if you are male. Use each tissue one time when you wipe. °Contact a doctor if: °· You have back pain. °· You have a fever. °· You feel sick to your stomach (nauseous). °· You throw up (vomit). °· Your symptoms do not get better after 3 days. °· Your symptoms go away and then come back. °Get help right away if: °· You have very bad back pain. °· You have very bad lower belly (abdominal) pain. °· You are throwing up and cannot keep down any medicines or water. °This information is not intended to replace advice given to you by your health care provider. Make sure you discuss any questions you have with your health care provider. °Document Released: 06/01/2008 Document Revised: 05/21/2016 Document Reviewed: 11/04/2015 °Elsevier Interactive Patient Education © 2018 Elsevier Inc. ° °

## 2018-04-03 ENCOUNTER — Emergency Department (HOSPITAL_COMMUNITY): Payer: Medicare Other

## 2018-04-03 ENCOUNTER — Inpatient Hospital Stay (HOSPITAL_COMMUNITY)
Admission: EM | Admit: 2018-04-03 | Discharge: 2018-04-06 | DRG: 064 | Disposition: A | Discharge status: Rehabilitation Facility | Payer: Medicare Other | Attending: Internal Medicine | Admitting: Internal Medicine

## 2018-04-03 ENCOUNTER — Other Ambulatory Visit: Payer: Self-pay

## 2018-04-03 ENCOUNTER — Encounter (HOSPITAL_COMMUNITY): Payer: Self-pay | Admitting: Emergency Medicine

## 2018-04-03 DIAGNOSIS — E785 Hyperlipidemia, unspecified: Secondary | ICD-10-CM | POA: Diagnosis present

## 2018-04-03 DIAGNOSIS — N183 Chronic kidney disease, stage 3 unspecified: Secondary | ICD-10-CM | POA: Diagnosis present

## 2018-04-03 DIAGNOSIS — D72829 Elevated white blood cell count, unspecified: Secondary | ICD-10-CM

## 2018-04-03 DIAGNOSIS — M24551 Contracture, right hip: Secondary | ICD-10-CM | POA: Diagnosis present

## 2018-04-03 DIAGNOSIS — F0390 Unspecified dementia without behavioral disturbance: Secondary | ICD-10-CM | POA: Diagnosis present

## 2018-04-03 DIAGNOSIS — E039 Hypothyroidism, unspecified: Secondary | ICD-10-CM

## 2018-04-03 DIAGNOSIS — I129 Hypertensive chronic kidney disease with stage 1 through stage 4 chronic kidney disease, or unspecified chronic kidney disease: Secondary | ICD-10-CM | POA: Diagnosis present

## 2018-04-03 DIAGNOSIS — I639 Cerebral infarction, unspecified: Secondary | ICD-10-CM | POA: Diagnosis present

## 2018-04-03 DIAGNOSIS — Z79890 Hormone replacement therapy (postmenopausal): Secondary | ICD-10-CM | POA: Diagnosis not present

## 2018-04-03 DIAGNOSIS — N390 Urinary tract infection, site not specified: Secondary | ICD-10-CM | POA: Diagnosis present

## 2018-04-03 DIAGNOSIS — R29701 NIHSS score 1: Secondary | ICD-10-CM | POA: Diagnosis present

## 2018-04-03 DIAGNOSIS — B961 Klebsiella pneumoniae [K. pneumoniae] as the cause of diseases classified elsewhere: Secondary | ICD-10-CM | POA: Diagnosis present

## 2018-04-03 DIAGNOSIS — E876 Hypokalemia: Secondary | ICD-10-CM | POA: Diagnosis present

## 2018-04-03 DIAGNOSIS — I6789 Other cerebrovascular disease: Secondary | ICD-10-CM | POA: Diagnosis not present

## 2018-04-03 DIAGNOSIS — M24552 Contracture, left hip: Secondary | ICD-10-CM | POA: Diagnosis present

## 2018-04-03 DIAGNOSIS — Z79899 Other long term (current) drug therapy: Secondary | ICD-10-CM | POA: Diagnosis not present

## 2018-04-03 DIAGNOSIS — E781 Pure hyperglyceridemia: Secondary | ICD-10-CM | POA: Diagnosis present

## 2018-04-03 DIAGNOSIS — K50919 Crohn's disease, unspecified, with unspecified complications: Secondary | ICD-10-CM

## 2018-04-03 DIAGNOSIS — F329 Major depressive disorder, single episode, unspecified: Secondary | ICD-10-CM | POA: Diagnosis present

## 2018-04-03 DIAGNOSIS — I63311 Cerebral infarction due to thrombosis of right middle cerebral artery: Secondary | ICD-10-CM | POA: Diagnosis not present

## 2018-04-03 DIAGNOSIS — I739 Peripheral vascular disease, unspecified: Secondary | ICD-10-CM | POA: Diagnosis present

## 2018-04-03 DIAGNOSIS — K219 Gastro-esophageal reflux disease without esophagitis: Secondary | ICD-10-CM | POA: Diagnosis present

## 2018-04-03 DIAGNOSIS — Z981 Arthrodesis status: Secondary | ICD-10-CM | POA: Diagnosis not present

## 2018-04-03 DIAGNOSIS — E7800 Pure hypercholesterolemia, unspecified: Secondary | ICD-10-CM | POA: Diagnosis present

## 2018-04-03 DIAGNOSIS — R531 Weakness: Secondary | ICD-10-CM | POA: Diagnosis present

## 2018-04-03 DIAGNOSIS — G629 Polyneuropathy, unspecified: Secondary | ICD-10-CM | POA: Diagnosis present

## 2018-04-03 DIAGNOSIS — Z87891 Personal history of nicotine dependence: Secondary | ICD-10-CM

## 2018-04-03 DIAGNOSIS — J9811 Atelectasis: Secondary | ICD-10-CM | POA: Diagnosis present

## 2018-04-03 DIAGNOSIS — G9341 Metabolic encephalopathy: Secondary | ICD-10-CM | POA: Diagnosis present

## 2018-04-03 DIAGNOSIS — G9340 Encephalopathy, unspecified: Secondary | ICD-10-CM

## 2018-04-03 DIAGNOSIS — Z7982 Long term (current) use of aspirin: Secondary | ICD-10-CM

## 2018-04-03 DIAGNOSIS — N400 Enlarged prostate without lower urinary tract symptoms: Secondary | ICD-10-CM | POA: Diagnosis present

## 2018-04-03 DIAGNOSIS — E538 Deficiency of other specified B group vitamins: Secondary | ICD-10-CM | POA: Diagnosis present

## 2018-04-03 DIAGNOSIS — G8194 Hemiplegia, unspecified affecting left nondominant side: Secondary | ICD-10-CM | POA: Diagnosis present

## 2018-04-03 DIAGNOSIS — I679 Cerebrovascular disease, unspecified: Secondary | ICD-10-CM

## 2018-04-03 DIAGNOSIS — M24562 Contracture, left knee: Secondary | ICD-10-CM | POA: Diagnosis present

## 2018-04-03 DIAGNOSIS — R0602 Shortness of breath: Secondary | ICD-10-CM

## 2018-04-03 DIAGNOSIS — E871 Hypo-osmolality and hyponatremia: Secondary | ICD-10-CM | POA: Diagnosis present

## 2018-04-03 DIAGNOSIS — M1990 Unspecified osteoarthritis, unspecified site: Secondary | ICD-10-CM | POA: Diagnosis present

## 2018-04-03 DIAGNOSIS — I69398 Other sequelae of cerebral infarction: Secondary | ICD-10-CM | POA: Diagnosis not present

## 2018-04-03 DIAGNOSIS — I69318 Other symptoms and signs involving cognitive functions following cerebral infarction: Secondary | ICD-10-CM | POA: Diagnosis present

## 2018-04-03 DIAGNOSIS — I452 Bifascicular block: Secondary | ICD-10-CM | POA: Diagnosis present

## 2018-04-03 DIAGNOSIS — D518 Other vitamin B12 deficiency anemias: Secondary | ICD-10-CM | POA: Diagnosis not present

## 2018-04-03 DIAGNOSIS — R29810 Facial weakness: Secondary | ICD-10-CM | POA: Diagnosis present

## 2018-04-03 DIAGNOSIS — L8990 Pressure ulcer of unspecified site, unspecified stage: Secondary | ICD-10-CM

## 2018-04-03 DIAGNOSIS — I10 Essential (primary) hypertension: Secondary | ICD-10-CM | POA: Diagnosis present

## 2018-04-03 DIAGNOSIS — K5090 Crohn's disease, unspecified, without complications: Secondary | ICD-10-CM | POA: Diagnosis present

## 2018-04-03 DIAGNOSIS — I69354 Hemiplegia and hemiparesis following cerebral infarction affecting left non-dominant side: Secondary | ICD-10-CM | POA: Diagnosis not present

## 2018-04-03 DIAGNOSIS — M24561 Contracture, right knee: Secondary | ICD-10-CM | POA: Diagnosis present

## 2018-04-03 DIAGNOSIS — G479 Sleep disorder, unspecified: Secondary | ICD-10-CM | POA: Diagnosis not present

## 2018-04-03 LAB — COMPREHENSIVE METABOLIC PANEL
ALBUMIN: 3.8 g/dL (ref 3.5–5.0)
ALT: 17 U/L (ref 17–63)
ANION GAP: 10 (ref 5–15)
AST: 28 U/L (ref 15–41)
Alkaline Phosphatase: 129 U/L — ABNORMAL HIGH (ref 38–126)
BUN: 21 mg/dL — ABNORMAL HIGH (ref 6–20)
CHLORIDE: 102 mmol/L (ref 101–111)
CO2: 24 mmol/L (ref 22–32)
Calcium: 9.4 mg/dL (ref 8.9–10.3)
Creatinine, Ser: 1.22 mg/dL (ref 0.61–1.24)
GFR calc Af Amer: >60 mL/min (ref >60)
GFR calc non Af Amer: 54 mL/min — ABNORMAL LOW (ref >60)
Glucose, Bld: 87 mg/dL (ref 65–99)
POTASSIUM: 4 mmol/L (ref 3.5–5.1)
SODIUM: 136 mmol/L (ref 135–145)
Total Bilirubin: 0.5 mg/dL (ref 0.3–1.2)
Total Protein: 6.9 g/dL (ref 6.5–8.1)

## 2018-04-03 LAB — DIFFERENTIAL
BASOS ABS: 0 10*3/uL (ref 0.0–0.1)
BASOS PCT: 0 %
EOS PCT: 7 %
Eosinophils Absolute: 0.5 10*3/uL (ref 0.0–0.7)
Lymphocytes Relative: 21 %
Lymphs Abs: 1.6 10*3/uL (ref 0.7–4.0)
Monocytes Absolute: 0.7 10*3/uL (ref 0.1–1.0)
Monocytes Relative: 9 %
NEUTROS PCT: 63 %
Neutro Abs: 4.8 10*3/uL (ref 1.7–7.7)

## 2018-04-03 LAB — URINALYSIS, ROUTINE W REFLEX MICROSCOPIC
Bilirubin Urine: NEGATIVE
GLUCOSE, UA: NEGATIVE mg/dL
HGB URINE DIPSTICK: NEGATIVE
Ketones, ur: NEGATIVE mg/dL
Leukocytes, UA: NEGATIVE
Nitrite: NEGATIVE
PROTEIN: NEGATIVE mg/dL
SPECIFIC GRAVITY, URINE: 1.01 (ref 1.005–1.030)
pH: 5 (ref 5.0–8.0)

## 2018-04-03 LAB — CBC
HCT: 40.1 % (ref 39.0–52.0)
HEMOGLOBIN: 13.4 g/dL (ref 13.0–17.0)
MCH: 29.3 pg (ref 26.0–34.0)
MCHC: 33.4 g/dL (ref 30.0–36.0)
MCV: 87.6 fL (ref 78.0–100.0)
PLATELETS: 354 10*3/uL (ref 150–400)
RBC: 4.58 MIL/uL (ref 4.22–5.81)
RDW: 14.8 % (ref 11.5–15.5)
WBC: 7.7 10*3/uL (ref 4.0–10.5)

## 2018-04-03 LAB — PROTIME-INR
INR: 1.05
PROTHROMBIN TIME: 13.6 s (ref 11.4–15.2)

## 2018-04-03 LAB — I-STAT CHEM 8, ED
BUN: 22 mg/dL — AB (ref 6–20)
CREATININE: 1.3 mg/dL — AB (ref 0.61–1.24)
Calcium, Ion: 1.22 mmol/L (ref 1.15–1.40)
Chloride: 102 mmol/L (ref 101–111)
Glucose, Bld: 87 mg/dL (ref 65–99)
HEMATOCRIT: 41 % (ref 39.0–52.0)
Hemoglobin: 13.9 g/dL (ref 13.0–17.0)
POTASSIUM: 4.1 mmol/L (ref 3.5–5.1)
SODIUM: 139 mmol/L (ref 135–145)
TCO2: 25 mmol/L (ref 22–32)

## 2018-04-03 LAB — APTT: APTT: 37 s — AB (ref 24–36)

## 2018-04-03 LAB — I-STAT TROPONIN, ED: Troponin i, poc: 0 ng/mL (ref 0.00–0.08)

## 2018-04-03 MED ORDER — ASPIRIN 300 MG RE SUPP: 300.0000 mg | Freq: Every day | RECTAL | Status: DC

## 2018-04-03 MED ORDER — ACETAMINOPHEN 325 MG PO TABS: 650.0000 mg | ORAL_TABLET | ORAL | Status: DC | PRN

## 2018-04-03 MED ORDER — IMIPRAMINE HCL 25 MG PO TABS
25.0000 mg | ORAL_TABLET | Freq: Every day | ORAL | Status: DC
  Administered 2018-04-03 – 2018-04-05 (×3): 25 mg via ORAL
  Filled 2018-04-03 (×4): qty 1

## 2018-04-03 MED ORDER — LORATADINE 10 MG PO TABS
10.0000 mg | ORAL_TABLET | Freq: Every day | ORAL | Status: DC
  Administered 2018-04-04 – 2018-04-06 (×3): 10 mg via ORAL
  Filled 2018-04-03 (×3): qty 1

## 2018-04-03 MED ORDER — TAMSULOSIN HCL 0.4 MG PO CAPS
0.4000 mg | ORAL_CAPSULE | Freq: Every day | ORAL | Status: DC
Start: 2018-04-04 — End: 2018-04-06
  Administered 2018-04-04 – 2018-04-06 (×3): 0.4 mg via ORAL
  Filled 2018-04-03 (×3): qty 1

## 2018-04-03 MED ORDER — IPRATROPIUM BROMIDE 0.06 % NA SOLN
2.0000 | Freq: Four times a day (QID) | NASAL | Status: DC
  Administered 2018-04-03 – 2018-04-06 (×7): 2 via NASAL
  Filled 2018-04-03 (×2): qty 15

## 2018-04-03 MED ORDER — HEPARIN SODIUM (PORCINE) 5000 UNIT/ML IJ SOLN
5000.0000 [IU] | Freq: Three times a day (TID) | INTRAMUSCULAR | Status: DC
  Administered 2018-04-03 – 2018-04-06 (×9): 5000 [IU] via SUBCUTANEOUS
  Filled 2018-04-03 (×9): qty 1

## 2018-04-03 MED ORDER — ACETAMINOPHEN 160 MG/5ML PO SOLN: 650.0000 mg | ORAL | Status: DC | PRN

## 2018-04-03 MED ORDER — GUAIFENESIN-DM 100-10 MG/5ML PO SYRP: 10.0000 mL | ORAL_SOLUTION | Freq: Four times a day (QID) | ORAL | Status: DC | PRN

## 2018-04-03 MED ORDER — SODIUM CHLORIDE 0.9 % IV SOLN
INTRAVENOUS | Status: DC
  Administered 2018-04-03: via INTRAVENOUS

## 2018-04-03 MED ORDER — OXYBUTYNIN CHLORIDE 5 MG PO TABS
5.0000 mg | ORAL_TABLET | Freq: Every day | ORAL | Status: DC
  Administered 2018-04-04 – 2018-04-06 (×3): 5 mg via ORAL
  Filled 2018-04-03 (×3): qty 1

## 2018-04-03 MED ORDER — SENNOSIDES-DOCUSATE SODIUM 8.6-50 MG PO TABS: 1.0000 | ORAL_TABLET | Freq: Every evening | ORAL | Status: DC | PRN

## 2018-04-03 MED ORDER — PANTOPRAZOLE SODIUM 40 MG PO TBEC
40.0000 mg | DELAYED_RELEASE_TABLET | Freq: Two times a day (BID) | ORAL | Status: DC
  Administered 2018-04-03 – 2018-04-06 (×6): 40 mg via ORAL
  Filled 2018-04-03 (×6): qty 1

## 2018-04-03 MED ORDER — LEVOTHYROXINE SODIUM 25 MCG PO TABS
25.0000 ug | ORAL_TABLET | Freq: Every day | ORAL | Status: DC
  Administered 2018-04-04 – 2018-04-06 (×3): 25 ug via ORAL
  Filled 2018-04-03 (×4): qty 1

## 2018-04-03 MED ORDER — HYDROCHLOROTHIAZIDE 25 MG PO TABS
25.0000 mg | ORAL_TABLET | Freq: Every day | ORAL | Status: DC
  Administered 2018-04-04 – 2018-04-06 (×3): 25 mg via ORAL
  Filled 2018-04-03 (×3): qty 1

## 2018-04-03 MED ORDER — ACETAMINOPHEN 650 MG RE SUPP: 650.0000 mg | RECTAL | Status: DC | PRN

## 2018-04-03 MED ORDER — STROKE: EARLY STAGES OF RECOVERY BOOK
Freq: Once | Status: AC
  Administered 2018-04-04: 05:00:00

## 2018-04-03 NOTE — ED Triage Notes (Signed)
Daughter stated, I was called from [facility] and they said he had weakness in his arms and legs, had a hard time putting his fork to his mouth. Las normal was last night at 2230.

## 2018-04-03 NOTE — ED Notes (Signed)
Pt. With = bilateral grips Alert and oriented to date, time , but is not able to recall the presidents name until later with clues. No arm drift but had some pain.

## 2018-04-03 NOTE — ED Provider Notes (Signed)
[facility] EMERGENCY DEPARTMENT Provider Note   CSN: [id number] Arrival date & time: 04/03/18  8101     History   Chief Complaint Chief Complaint  Patient presents with  . Extremity Weakness    HPI Anthony Hayes is a 83 y.o. male.  The history is provided by the patient and a relative. No language interpreter was used.  Extremity Weakness     Anthony Hayes is a 83 y.o. male who presents to the Emergency Department complaining of weakness.  Level V caveat due to confusion. History is provided by the patient and his daughter. His daughter states that he was in his routine state of health last night. This morning she received a call from [facility] and stating that he did not seem quite himself. They stated that he had difficulty ambulating to breakfast and while he was at breakfast he had difficulty using both of his hands. He states that he feels tired and a little dizzy when standing. She reports similar symptoms in the past related to urinary tract infection. No reports of fevers, cough, chest pain, abdominal pain, nausea, vomiting, dysuria.  Past Medical History:  Diagnosis Date  . Arthritis    "joints" (10/15/2017)  . Benign prostatic hyperplasia   . Complication of anesthesia    "last OR in 06/2017 affected him cognitively; hasn't got back to baseline yet" (10/15/2017)  . Dementia   . Depression   . GERD (gastroesophageal reflux disease)   . High cholesterol   . Hypertension   . Hypothyroidism   . Renal disease   . Renal disorder   . TIA (transient ischemic attack) early 2000s    Patient Active Problem List   Diagnosis Date Noted  . Calculus, kidney 01/21/2018  . Acute blood loss anemia   . Mallory-Weiss syndrome   . Esophageal stricture   . Respiratory failure ([place])   . UGIB (upper gastrointestinal bleed) 10/16/2017  . GERD (gastroesophageal reflux disease) 10/16/2017  . Essential hypertension, benign 10/16/2017  . Chest pain  10/15/2017  . Hypothyroidism (acquired) 06/08/2017  . Cerebral vascular disease 07/22/2016  . Chronic kidney disease, stage III (moderate) ([place]) 03/19/2016  . Dyslipidemia 03/19/2016  . Degeneration of lumbar or lumbosacral intervertebral disc 07/10/2008  . Arthropathy, lower leg 05/23/2004    Past Surgical History:  Procedure Laterality Date  . ABDOMINAL EXPLORATION SURGERY  7/  . APPENDECTOMY    . CATARACT EXTRACTION W/ INTRAOCULAR LENS  IMPLANT, BILATERAL Bilateral   . COLECTOMY  1950s   "thought he had iteilis"  . ESOPHAGOGASTRODUODENOSCOPY N/A 10/22/2017   Procedure: ESOPHAGOGASTRODUODENOSCOPY (EGD);  Surgeon: Gatha Mayer, MD;  Location: [facility] ENDOSCOPY;  Service: Endoscopy;  Laterality: N/A;  . ESOPHAGOGASTRODUODENOSCOPY (EGD) WITH PROPOFOL N/A 10/16/2017   Procedure: ESOPHAGOGASTRODUODENOSCOPY (EGD) WITH PROPOFOL;  Surgeon: Milus Banister, MD;  Location: [facility] ENDOSCOPY;  Service: Endoscopy;  Laterality: N/A;  . FEMUR FRACTURE SURGERY Right 1950s  . FRACTURE SURGERY    . KNEE ARTHROSCOPY Left   . POSTERIOR LUMBAR FUSION    . ROTATOR CUFF REPAIR Left   . TONSILLECTOMY          Home Medications    Prior to Admission medications   Medication Sig Start Date End Date Taking? Authorizing Provider  aspirin EC 81 MG tablet Take 81 mg by mouth daily.   Yes [provider]  cetirizine (ZYRTEC) 10 MG tablet Take 10 mg by mouth daily.   Yes [provider]  hydrochlorothiazide (HYDRODIURIL) 25  MG tablet Take 25 mg by mouth daily.   Yes [provider]  imipramine (TOFRANIL) 25 MG tablet Take 25 mg by mouth at bedtime. 10/08/17  Yes [provider]  ipratropium (ATROVENT) 0.06 % nasal spray Place 2 sprays into both nostrils 4 (four) times daily.   Yes [provider]  levothyroxine (SYNTHROID, LEVOTHROID) 50 MCG tablet Take 25 mcg by mouth every morning.  10/01/17  Yes [provider]  oxybutynin (DITROPAN) 5 MG tablet Take 5 mg  by mouth every morning. 10/05/17  Yes [provider]  pantoprazole (PROTONIX) 40 MG tablet Take 1 tablet (40 mg total) by mouth 2 (two) times daily. 10/22/17  Yes Lacroce, Hulen Shouts, MD  promethazine-dextromethorphan (PROMETHAZINE-DM) 6.25-15 MG/5ML syrup Take 10 mLs by mouth 4 (four) times daily as needed for cough.   Yes [provider]  tamsulosin (FLOMAX) 0.4 MG CAPS capsule Take 1 capsule (0.4 mg total) by mouth daily. 01/21/18  Yes Barnet Glasgow, NP  cephALEXin (KEFLEX) 500 MG capsule Take 1 capsule (500 mg total) by mouth 4 (four) times daily. Patient not taking: Reported on 04/03/2018 01/21/18   Barnet Glasgow, NP    Family History No family history on file.  Social History Social History   Tobacco Use  . Smoking status: Former Smoker    Years: 7.00    Types: Cigarettes    Last attempt to quit: 1963    Years since quitting: 56.3  . Smokeless tobacco: Never Used  Substance Use Topics  . Alcohol use: Yes    Comment: 10/15/2017 "a few drinks/year"  . Drug use: No     Allergies   Lisinopril   Review of Systems Review of Systems  Musculoskeletal: Positive for extremity weakness.  All other systems reviewed and are negative.    Physical Exam Updated Vital Signs BP (!) 163/75   Pulse (!) 51   Temp 98.3 F (36.8 C)   Resp 17   Ht 5' 5"  (1.651 m)   Wt 79.4 kg (175 lb)   SpO2 100%   BMI 29.12 kg/m   Physical Exam  Constitutional: He appears well-developed and well-nourished.  HENT:  Head: Normocephalic and atraumatic.  Cardiovascular: Normal rate and regular rhythm.  No murmur heard. Pulmonary/Chest: Effort normal and breath sounds normal. No respiratory distress.  Abdominal: Soft. There is no tenderness. There is no rebound and no guarding.  Musculoskeletal: He exhibits no edema or tenderness.  Neurological: He is alert.  Mildly confused. Oriented to place. Disoriented to time. Five out of five strength in all four extremities. No  facial asymmetry. Sensation to light touch intact in all four extremities. Slight pronator drift and bilateral upper extremities. Visual fields are grossly intact. There is slight ataxia on finger to nose in the left upper extremity.  Skin: Skin is warm and dry.  Psychiatric: He has a normal mood and affect. His behavior is normal.  Nursing note and vitals reviewed.    ED Treatments / Results  Labs (all labs ordered are listed, but only abnormal results are displayed) Labs Reviewed  APTT - Abnormal; Notable for the following components:      Result Value   aPTT 37 (*)    All other components within normal limits  COMPREHENSIVE METABOLIC PANEL - Abnormal; Notable for the following components:   BUN 21 (*)    Alkaline Phosphatase 129 (*)    GFR calc non Af Amer 54 (*)    All other components within normal limits  I-STAT CHEM 8, ED - Abnormal; Notable for the following components:   BUN 22 (*)    Creatinine, Ser 1.30 (*)    All other components within normal limits  URINE CULTURE  PROTIME-INR  CBC  DIFFERENTIAL  URINALYSIS, ROUTINE W REFLEX MICROSCOPIC  I-STAT TROPONIN, ED    EKG EKG Interpretation  Date/Time:  Sunday April 03 2018 10:06:01 EDT Ventricular Rate:  54 PR Interval:  178 QRS Duration: 152 QT Interval:  506 QTC Calculation: 479 R Axis:   -77 Text Interpretation:  Sinus bradycardia Right bundle branch block Left anterior fascicular block No significant change since last tracing Confirmed by Quintella Reichert [phone]) on 04/03/2018 11:57:31 AM   Radiology Dg Chest 2 View  Result Date: 04/03/2018 CLINICAL DATA:  Bilateral arm weakness. EXAM: CHEST - 2 VIEW COMPARISON:  February 02, 2018 FINDINGS: The heart, hila, and mediastinum are normal. No pulmonary nodules or masses. No focal infiltrates. IMPRESSION: No active cardiopulmonary disease. Electronically Signed   By: Dorise Bullion III M.D   On: 04/03/2018 12:53   Ct Head Wo Contrast  Result Date:  04/03/2018 CLINICAL DATA:  Pt has weakness in all extremities and problem getting his spoon or fork to his mouth EXAM: CT HEAD WITHOUT CONTRAST TECHNIQUE: Contiguous axial images were obtained from the base of the skull through the vertex without intravenous contrast. COMPARISON:  10/18/2017 FINDINGS: Brain: No evidence of acute infarction, hemorrhage, hydrocephalus, extra-axial collection or mass lesion/mass effect. There is ventricular sulcal enlargement reflecting mild diffuse atrophy. Patchy white matter hypoattenuation is also noted consistent with moderate chronic microvascular ischemic change. Vascular: No hyperdense vessel or unexpected calcification. Skull: Normal. Negative for fracture or focal lesion. Sinuses/Orbits: Globes and orbits are unremarkable. Visualized sinuses and mastoid air cells are clear. Other: None. IMPRESSION: 1. No acute intracranial abnormalities. 2. Atrophy and chronic microvascular ischemic change. Stable appearance from the prior study. Electronically Signed   By: Lajean Manes M.D.   On: 04/03/2018 12:37   Mr Brain Wo Contrast (neuro Protocol)  Result Date: 04/03/2018 CLINICAL DATA:  Weakness in the arms and legs today. EXAM: MRI HEAD WITHOUT CONTRAST TECHNIQUE: Multiplanar, multiecho pulse sequences of the brain and surrounding structures were obtained without intravenous contrast. COMPARISON:  CT 04/03/2018 FINDINGS: Brain: Diffusion imaging shows areas of acute infarction on the right affecting the posterior basal ganglia and radiating white matter tracts. No cortical or large vessel territory infarction. There chronic small-vessel ischemic changes throughout the pons. No focal cerebellar insult. There is an old lacunar infarction in the left thalamus. Cerebral hemispheres elsewhere show chronic small-vessel ischemic changes affecting the basal ganglia and throughout the cerebral hemispheric white matter. Few punctate foci of hemosiderin deposition associated with some of the  old strokes. No sign of mass lesion, acute hemorrhage, hydrocephalus or extra-axial collection. Vascular: Major vessels at the base of the brain show flow. Skull and upper cervical spine: Negative Sinuses/Orbits: Clear/normal Other: None IMPRESSION: Small areas of acute infarction affecting the posterior right basal ganglia and radiating white matter tracts on the right. No swelling or hemorrhage. Extensive chronic small-vessel ischemic changes elsewhere throughout the brain as outlined above. Electronically Signed   By: Nelson Chimes M.D.   On: 04/03/2018 20:57    Procedures Procedures (including critical care time)  Medications Ordered in ED Medications - No data to display   Initial Impression / Assessment and Plan / ED Course  I have reviewed the triage vital signs and the nursing notes.  Pertinent labs & imaging results  that were available during my care of the patient were reviewed by me and considered in my medical decision making (see chart for details).     Patient here for evaluation of difficulty walking and difficulty using his hands that occurred today. He was last seen well last night. He does have ataxia on the left upper extremity. CT is negative for acute intracranial abnormality. MRI is concerning for acute CVA. Hospitalist consulted for admission for further treatment. Patient and daughter updated findings of studies recommendation for admission and they are in agreement with plan. Discussed the case with a neurologist on call who will see the patient in consult.  Final Clinical Impressions(s) / ED Diagnoses   Final diagnoses:  None    ED Discharge Orders    None       Quintella Reichert, MD 04/03/18 2147

## 2018-04-03 NOTE — Consult Note (Signed)
Neurology Consultation  Reason for Consult: Acute stroke Referring Physician: Dr. Ayesha Rumpf  CC: Left-sided weakness  History is obtained from: Patient, patient started bedside, chart  HPI: Anthony Hayes is a 83 y.o. male past medical history of hypertension, hyperlipidemia, hypothyroidism, stroke/TIA in the past without any residual deficits, dementia requiring assisted home living, was to the best of information available last normal before going to bed yesterday 55 9 PM, woke up this morning complaining of left-sided weakness and was noted to have difficulty walking towards the dining hall. He was brought in for evaluation for lateralized weakness.  Imaging was done that revealed an acute stroke on MRI in the right basal ganglia and the coronary radiata. Neurological consultation was obtained for stroke workup. Patient's daughter at the bedside, who is a Astronomer in [place], said that he has been having memory issues for some time but since last August has been more progressive requiring assisted home living. She said he has had strokes in the past with no residual deficits. She also said that he has a history of bowel resection at a young age for suspected Crohn's disease and has been multiple times vitamin deficient requiring vitamin injections.  Recently his vitamin B12 levels have not been checked. He reports left-sided weakness and left-sided facial droop that has been going on for more than just last night but cannot provide reliable history.  LKW: 8 PM on 04/02/2018 tpa given?: no, outside the window Premorbid modified Rankin scale (mRS): 4   ROS:ROS was performed and is negative except as noted in the HPI   Past Medical History:  Diagnosis Date  . Arthritis    "joints" (10/15/2017)  . Benign prostatic hyperplasia   . Complication of anesthesia    "last OR in 06/2017 affected him cognitively; hasn't got back to baseline yet" (10/15/2017)  . Dementia   . Depression   .  GERD (gastroesophageal reflux disease)   . High cholesterol   . Hypertension   . Hypothyroidism   . Renal disease   . Renal disorder   . TIA (transient ischemic attack) early 2000s    No family history on file.  Social History:   reports that he quit smoking about 56 years ago. His smoking use included cigarettes. He quit after 7.00 years of use. He has never used smokeless tobacco. He reports that he drinks alcohol. He reports that he does not use drugs. Remote history of tobacco use No illicit drug use Retired Software engineer No significant alcohol abuse  Medications No current facility-administered medications for this encounter.   Current Outpatient Medications:  .  aspirin EC 81 MG tablet, Take 81 mg by mouth daily., Disp: , Rfl:  .  cetirizine (ZYRTEC) 10 MG tablet, Take 10 mg by mouth daily., Disp: , Rfl:  .  hydrochlorothiazide (HYDRODIURIL) 25 MG tablet, Take 25 mg by mouth daily., Disp: , Rfl:  .  imipramine (TOFRANIL) 25 MG tablet, Take 25 mg by mouth at bedtime., Disp: , Rfl:  .  ipratropium (ATROVENT) 0.06 % nasal spray, Place 2 sprays into both nostrils 4 (four) times daily., Disp: , Rfl:  .  levothyroxine (SYNTHROID, LEVOTHROID) 50 MCG tablet, Take 25 mcg by mouth every morning. , Disp: , Rfl:  .  oxybutynin (DITROPAN) 5 MG tablet, Take 5 mg by mouth every morning., Disp: , Rfl:  .  pantoprazole (PROTONIX) 40 MG tablet, Take 1 tablet (40 mg total) by mouth 2 (two) times daily., Disp: 60 tablet, Rfl: 0 .  promethazine-dextromethorphan (PROMETHAZINE-DM) 6.25-15 MG/5ML syrup, Take 10 mLs by mouth 4 (four) times daily as needed for cough., Disp: , Rfl:  .  tamsulosin (FLOMAX) 0.4 MG CAPS capsule, Take 1 capsule (0.4 mg total) by mouth daily., Disp: 14 capsule, Rfl: 0 .  cephALEXin (KEFLEX) 500 MG capsule, Take 1 capsule (500 mg total) by mouth 4 (four) times daily. (Patient not taking: Reported on 04/03/2018), Disp: 28 capsule, Rfl: 0  Exam: Current vital signs: BP (!) 163/75    Pulse (!) 51   Temp 98.3 F (36.8 C)   Resp 17   Ht 5' 5"  (1.651 m)   Wt 79.4 kg (175 lb)   SpO2 100%   BMI 29.12 kg/m  Vital signs in last 24 hours: Temp:  [98.3 F (36.8 C)] 98.3 F (36.8 C) (04/07 1256) Pulse Rate:  [48-60] 51 (04/07 2100) Resp:  [11-22] 17 (04/07 2100) BP: (127-163)/(67-98) 163/75 (04/07 2100) SpO2:  [95 %-100 %] 100 % (04/07 2100) Weight:  [79.4 kg (175 lb)] 79.4 kg (175 lb) (04/07 1004)  GENERAL: Awake, alert in NAD HEENT: - Normocephalic and atraumatic, dry mm, no LN++, no Thyromegally LUNGS - Clear to auscultation bilaterally with no wheezes CV - S1S2 RRR, no m/r/g, equal pulses bilaterally. ABDOMEN - Soft, nontender, nondistended with normoactive BS Ext: warm, well perfused, intact peripheral pulses, no edema  NEURO:  Mental Status: Awake, alert, oriented x2. Was able to tell me the name of the president.  Was not able to name the governor of the state. Was able to tell me the month-April but not the year. Speech is clear.  Naming, repetition comprehension intact. Cranial Nerves: PERRL EOMI, visual fields full, mild lower facial weakness on the left at rest which is overcome by smiling, facial sensation intact, hearing intact, tongue/uvula/soft palate midline, normal sternocleidomastoid and trapezius muscle strength. No evidence of tongue atrophy or fibrillations Motor: Grossly symmetric antigravity without vertical drift in all 4 extremities.  Examination of all 4 extremities limited by pain in multiple joints. Tone: is increased in the left upper and lower extremity compared to right Sensation-mildly decreased in a stocking pattern in both legs but not different between the 2 sides,-no extinction on double simultaneous stimulation Coordination: FTN intact bilaterally, no ataxia in BLE.  The daughter reported that he is not able to touch his nose with his eyes closed with the left hand.  Upon attempting to do that, he did seem to have some sensory  ataxia with eyes closed on doing a left nose touch with his left index finger. Gait- deferred DTRs: 1+ biceps, 0 brachioradialis, 0 knee jerks and 0 ankle jerks  NIHSS 1a Level of Conscious.: 0 1b LOC Questions: 0 1c LOC Commands: 0 2 Best Gaze: 0 3 Visual: 0 4 Facial Palsy: 1 5a Motor Arm - left: 0 5b Motor Arm - Right: 0 6a Motor Leg - Left: 0 6b Motor Leg - Right: 0 7 Limb Ataxia: 1 8 Sensory: 0 9 Best Language: 0 10 Dysarthria: 0 11 Extinct. and Inatten.: 0 TOTAL: 1  Labs I have reviewed labs in epic and the results pertinent to this consultation are:  CBC    Component Value Date/Time   WBC 7.7 04/03/2018 1019   RBC 4.58 04/03/2018 1019   HGB 13.9 04/03/2018 1028   HCT 41.0 04/03/2018 1028   PLT 354 04/03/2018 1019   MCV 87.6 04/03/2018 1019   MCH 29.3 04/03/2018 1019   MCHC 33.4 04/03/2018 1019   RDW 14.8 04/03/2018  1019   LYMPHSABS 1.6 04/03/2018 1019   MONOABS 0.7 04/03/2018 1019   EOSABS 0.5 04/03/2018 1019   BASOSABS 0.0 04/03/2018 1019    CMP     Component Value Date/Time   NA 139 04/03/2018 1028   K 4.1 04/03/2018 1028   CL 102 04/03/2018 1028   CO2 24 04/03/2018 1019   GLUCOSE 87 04/03/2018 1028   BUN 22 (H) 04/03/2018 1028   CREATININE 1.30 (H) 04/03/2018 1028   CALCIUM 9.4 04/03/2018 1019   PROT 6.9 04/03/2018 1019   ALBUMIN 3.8 04/03/2018 1019   AST 28 04/03/2018 1019   ALT 17 04/03/2018 1019   ALKPHOS 129 (H) 04/03/2018 1019   BILITOT 0.5 04/03/2018 1019   GFRNONAA 54 (L) 04/03/2018 1019   GFRAA >60 04/03/2018 1019   Imaging I have reviewed the images obtained:  CT-scan of the brain -no bleed, no acute changes  MRI examination of the brain -lacunar stroke in the right basal ganglia/corona radiata.  No bleed.  Generalized atrophy with increased size of the ventricles.  Extensive small vessel disease  Assessment:  83 year old man past history of hypertension hyperlipidemia hypothyroidism dementia, stroke/TIA in the past without any  residual deficits brought in from assisted living for concerns of left-sided weakness. MRI of the brain reveals a right basal ganglia/corona radiata infarct. Last known normal is at least 24 hours ago at best.  Not a candidate for TPA for that reason.  No cortical signs on exam hence not a candidate for mechanical from back to me. Most likely a small vessel etiology acute ischemic stroke of the right basal ganglia. Interestingly enough also has some evidence of peripheral neuropathy, and given a history of bowel resection at a young age, we will check reversible causes of neuropathy.  Impression: Acute ischemic stroke-right basal ganglia-small vessel etiology Peripheral neuropathy-likely secondary to vitamin deficiency and hypothyroidism  Recommendations: For stroke risk factor w/u: -Admit to hospitalist -Telemetry monitoring -Allow for permissive hypertension for the first 24-48h - only treat PRN if SBP >220 mmHg. Blood pressures can be gradually normalized to SBP<140 upon discharge. -CT Angiogram of Head and neck -Echocardiogram -HgbA1c, fasting lipid panel -Frequent neuro checks -Prophylactic therapy-Antiplatelet med: Aspirin - dose 341m PO or 3050mPR for now- stroke team to give final recs on w/u completion if DAPT is appropriate -Atorvastatin 80 mg PO daily* -Risk factor modification -PT consult, OT consult, Speech consult -If Afib found on telemetry, will need anticoagulation. Decision pending clinical course, cardiac monitoring,and stroke team rounding.   For peripheral neuropathy w/u: -Check B12, TSH, A1c -Replete as necessary -Family requests prescriptions for B12 shots if indicated on discharge.  I spoke with the patient and daughter at bedside in detail about the plan and answered all their questions.  I also communicated the plan to the ED provider.  Please page stroke NP/PA/MD (listed on AMION)  from 8am-4 pm as this patient will be followed by the stroke team at this  point.  -- AsAmie PortlandMD [facility] Pager: [phone] 7pm to 7am, please call on call as listed on AMION.

## 2018-04-03 NOTE — ED Notes (Signed)
Patient transported to MRI 

## 2018-04-03 NOTE — ED Notes (Signed)
In and out cath tolerated without incident assisted by Shanon Brow EMT.

## 2018-04-03 NOTE — ED Notes (Signed)
Patient returned from CT

## 2018-04-03 NOTE — H&P (Signed)
History and Physical    Anthony Hayes MVE:[id number] DOB: 07-19-1935 DOA: 04/03/2018  Referring MD/NP/PA: Dr. Marin Roberts  PCP: System, Pcp Not In   Outpatient Specialists: None  Patient coming from: ALF  Chief Complaint: Weakness  HPI: Anthony Hayes is a 83 y.o. male with medical history significant of BPH, dementia, previous CVA, history of multiple colon resections due to Crohn's disease as a child with poor absorption of nutrients and osteoarthritis who is a resident at [facility] brought in by daughter after she got a call that he was acting differently. He was fine yesterday but today was 1 week, dizzy and more confused. Initially the thought he had urinary tract infection.He had no fever no chills. She was brought to the emergency room where head CT was negative but MRI showed a new stroke.  ED Course: Vitals and labs were stable. Head Ct without contrast negative but MRI brain showed Small areas of acute infarction affecting the posterior right basal ganglia and radiating white matter tracts on the right. No swelling or hemorrhage.  Extensive chronic small-vessel ischemic changes elsewhere throughout the brain   Review of Systems: As per HPI otherwise 10 point review of systems negative.    Past Medical History:  Diagnosis Date  . Arthritis    "joints" (10/15/2017)  . Benign prostatic hyperplasia   . Complication of anesthesia    "last OR in 06/2017 affected him cognitively; hasn't got back to baseline yet" (10/15/2017)  . Dementia   . Depression   . GERD (gastroesophageal reflux disease)   . High cholesterol   . Hypertension   . Hypothyroidism   . Renal disease   . Renal disorder   . TIA (transient ischemic attack) early 2000s    Past Surgical History:  Procedure Laterality Date  . ABDOMINAL EXPLORATION SURGERY  7/  . APPENDECTOMY    . CATARACT EXTRACTION W/ INTRAOCULAR LENS  IMPLANT, BILATERAL Bilateral   . COLECTOMY  1950s   "thought he had iteilis"  . ESOPHAGOGASTRODUODENOSCOPY N/A 10/22/2017   Procedure: ESOPHAGOGASTRODUODENOSCOPY (EGD);  Surgeon: Gatha Mayer, MD;  Location: [facility] ENDOSCOPY;  Service: Endoscopy;  Laterality: N/A;  . ESOPHAGOGASTRODUODENOSCOPY (EGD) WITH PROPOFOL N/A 10/16/2017   Procedure: ESOPHAGOGASTRODUODENOSCOPY (EGD) WITH PROPOFOL;  Surgeon: Milus Banister, MD;  Location: [facility] ENDOSCOPY;  Service: Endoscopy;  Laterality: N/A;  . FEMUR FRACTURE SURGERY Right 1950s  . FRACTURE SURGERY    . KNEE ARTHROSCOPY Left   . POSTERIOR LUMBAR FUSION    . ROTATOR CUFF REPAIR Left   . TONSILLECTOMY       reports that he quit smoking about 56 years ago. His smoking use included cigarettes. He quit after 7.00 years of use. He has never used smokeless tobacco. He reports that he drinks alcohol. He reports that he does not use drugs.  Allergies  Allergen Reactions  . Lisinopril Swelling    Swelling of lips     No family history on file.  Prior to Admission medications   Medication Sig Start Date End Date Taking? Authorizing Provider  aspirin EC 81 MG tablet Take 81 mg by mouth daily.   Yes [provider]  cetirizine (ZYRTEC) 10 MG tablet Take 10 mg by mouth daily.   Yes [provider]  hydrochlorothiazide (HYDRODIURIL) 25 MG tablet Take 25 mg by mouth daily.   Yes [provider]  imipramine (TOFRANIL) 25 MG tablet Take 25 mg by mouth at bedtime. 10/08/17  Yes [provider]  ipratropium (  ATROVENT) 0.06 % nasal spray Place 2 sprays into both nostrils 4 (four) times daily.   Yes [provider]  levothyroxine (SYNTHROID, LEVOTHROID) 50 MCG tablet Take 25 mcg by mouth every morning.  10/01/17  Yes [provider]  oxybutynin (DITROPAN) 5 MG tablet Take 5 mg by mouth every morning. 10/05/17  Yes [provider]  pantoprazole (PROTONIX) 40 MG tablet Take 1 tablet (40 mg total) by mouth 2 (two) times daily. 10/22/17  Yes Lacroce, Hulen Shouts, MD  promethazine-dextromethorphan (PROMETHAZINE-DM) 6.25-15 MG/5ML syrup Take 10 mLs by mouth 4 (four) times daily as needed for cough.   Yes [provider]  tamsulosin (FLOMAX) 0.4 MG CAPS capsule Take 1 capsule (0.4 mg total) by mouth daily. 01/21/18  Yes Barnet Glasgow, NP  cephALEXin (KEFLEX) 500 MG capsule Take 1 capsule (500 mg total) by mouth 4 (four) times daily. Patient not taking: Reported on 04/03/2018 01/21/18   Barnet Glasgow, NP    Physical Exam: Vitals:   04/03/18 2000 04/03/18 2100 04/03/18 2111 04/03/18 2124  BP: (!) 156/81 (!) 163/75    Pulse: (!) 52 (!) 51 (!) 52 (!) 49  Resp: 11 17 (!) 21 13  Temp:      TempSrc:      SpO2: 98% 100% 98% 100%  Weight:      Height:          Constitutional: NAD, calm, comfortable Vitals:   04/03/18 2000 04/03/18 2100 04/03/18 2111 04/03/18 2124  BP: (!) 156/81 (!) 163/75    Pulse: (!) 52 (!) 51 (!) 52 (!) 49  Resp: 11 17 (!) 21 13  Temp:      TempSrc:      SpO2: 98% 100% 98% 100%  Weight:      Height:       Eyes: PERRL, lids and conjunctivae normal ENMT: Mucous membranes are moist. Posterior pharynx clear of any exudate or lesions.Normal dentition.  Neck: normal, supple, no masses, no thyromegaly Respiratory: clear to auscultation bilaterally, no wheezing, no crackles. Normal respiratory effort. No accessory muscle use.  Cardiovascular: Regular rate and rhythm, no murmurs / rubs / gallops. No extremity edema. 2+ pedal pulses. No carotid bruits.  Abdomen: no tenderness, no masses palpated. No hepatosplenomegaly. Bowel sounds positive.  Musculoskeletal: no clubbing / cyanosis. No joint deformity upper and lower extremities. Good ROM, no contractures. Normal muscle tone.  Skin: no rashes, lesions, ulcers. No induration Neurologic: Left Facial droop, left hemiparesis,  Psychiatric: Normal judgment and insight. Alert and oriented x 3. Normal mood.    Labs on Admission: I have personally reviewed following labs  and imaging studies  CBC: Recent Labs  Lab 04/03/18 1019 04/03/18 1028  WBC 7.7  --   NEUTROABS 4.8  --   HGB 13.4 13.9  HCT 40.1 41.0  MCV 87.6  --   PLT 354  --    Basic Metabolic Panel: Recent Labs  Lab 04/03/18 1019 04/03/18 1028  NA 136 139  K 4.0 4.1  CL 102 102  CO2 24  --   GLUCOSE 87 87  BUN 21* 22*  CREATININE 1.22 1.30*  CALCIUM 9.4  --    GFR: Estimated Creatinine Clearance: 44 mL/min (A) (by C-G formula based on SCr of 1.3 mg/dL (H)). Liver Function Tests: Recent Labs  Lab 04/03/18 1019  AST 28  ALT 17  ALKPHOS 129*  BILITOT 0.5  PROT 6.9  ALBUMIN 3.8   No results for input(s): LIPASE, AMYLASE in the last  168 hours. No results for input(s): AMMONIA in the last 168 hours. Coagulation Profile: Recent Labs  Lab 04/03/18 1019  INR 1.05   Cardiac Enzymes: No results for input(s): CKTOTAL, CKMB, CKMBINDEX, TROPONINI in the last 168 hours. BNP (last 3 results) No results for input(s): PROBNP in the last 8760 hours. HbA1C: No results for input(s): HGBA1C in the last 72 hours. CBG: No results for input(s): GLUCAP in the last 168 hours. Lipid Profile: No results for input(s): CHOL, HDL, LDLCALC, TRIG, CHOLHDL, LDLDIRECT in the last 72 hours. Thyroid Function Tests: No results for input(s): TSH, T4TOTAL, FREET4, T3FREE, THYROIDAB in the last 72 hours. Anemia Panel: No results for input(s): VITAMINB12, FOLATE, FERRITIN, TIBC, IRON, RETICCTPCT in the last 72 hours. Urine analysis:    Component Value Date/Time   COLORURINE YELLOW 04/03/2018 [place] 04/03/2018 1457   LABSPEC 1.010 04/03/2018 1457   PHURINE 5.0 04/03/2018 1457   GLUCOSEU NEGATIVE 04/03/2018 1457   HGBUR NEGATIVE 04/03/2018 1457   BILIRUBINUR NEGATIVE 04/03/2018 1457   BILIRUBINUR NEG 01/21/2018 2022   KETONESUR NEGATIVE 04/03/2018 1457   PROTEINUR NEGATIVE 04/03/2018 1457   UROBILINOGEN 1.0 01/21/2018 2022   NITRITE NEGATIVE 04/03/2018 1457   LEUKOCYTESUR  NEGATIVE 04/03/2018 1457   Sepsis Labs: @LABRCNTIP (procalcitonin:4,lacticidven:4) )No results found for this or any previous visit (from the past 240 hour(s)).   Radiological Exams on Admission: Dg Chest 2 View  Result Date: 04/03/2018 CLINICAL DATA:  Bilateral arm weakness. EXAM: CHEST - 2 VIEW COMPARISON:  February 02, 2018 FINDINGS: The heart, hila, and mediastinum are normal. No pulmonary nodules or masses. No focal infiltrates. IMPRESSION: No active cardiopulmonary disease. Electronically Signed   By: Dorise Bullion III M.D   On: 04/03/2018 12:53   Ct Head Wo Contrast  Result Date: 04/03/2018 CLINICAL DATA:  Pt has weakness in all extremities and problem getting his spoon or fork to his mouth EXAM: CT HEAD WITHOUT CONTRAST TECHNIQUE: Contiguous axial images were obtained from the base of the skull through the vertex without intravenous contrast. COMPARISON:  10/18/2017 FINDINGS: Brain: No evidence of acute infarction, hemorrhage, hydrocephalus, extra-axial collection or mass lesion/mass effect. There is ventricular sulcal enlargement reflecting mild diffuse atrophy. Patchy white matter hypoattenuation is also noted consistent with moderate chronic microvascular ischemic change. Vascular: No hyperdense vessel or unexpected calcification. Skull: Normal. Negative for fracture or focal lesion. Sinuses/Orbits: Globes and orbits are unremarkable. Visualized sinuses and mastoid air cells are clear. Other: None. IMPRESSION: 1. No acute intracranial abnormalities. 2. Atrophy and chronic microvascular ischemic change. Stable appearance from the prior study. Electronically Signed   By: Lajean Manes M.D.   On: 04/03/2018 12:37   Mr Brain Wo Contrast (neuro Protocol)  Result Date: 04/03/2018 CLINICAL DATA:  Weakness in the arms and legs today. EXAM: MRI HEAD WITHOUT CONTRAST TECHNIQUE: Multiplanar, multiecho pulse sequences of the brain and surrounding structures were obtained without intravenous contrast.  COMPARISON:  CT 04/03/2018 FINDINGS: Brain: Diffusion imaging shows areas of acute infarction on the right affecting the posterior basal ganglia and radiating white matter tracts. No cortical or large vessel territory infarction. There chronic small-vessel ischemic changes throughout the pons. No focal cerebellar insult. There is an old lacunar infarction in the left thalamus. Cerebral hemispheres elsewhere show chronic small-vessel ischemic changes affecting the basal ganglia and throughout the cerebral hemispheric white matter. Few punctate foci of hemosiderin deposition associated with some of the old strokes. No sign of mass lesion, acute hemorrhage, hydrocephalus or extra-axial collection. Vascular: Major  vessels at the base of the brain show flow. Skull and upper cervical spine: Negative Sinuses/Orbits: Clear/normal Other: None IMPRESSION: Small areas of acute infarction affecting the posterior right basal ganglia and radiating white matter tracts on the right. No swelling or hemorrhage. Extensive chronic small-vessel ischemic changes elsewhere throughout the brain as outlined above. Electronically Signed   By: Nelson Chimes M.D.   On: 04/03/2018 20:57    EKG: Independently reviewed. Shows sinus tachycardia with bifascicular block nonspecific ST changes  Assessment/Plan Principal Problem:   Cerebral vascular disease Active Problems:   UGIB (upper gastrointestinal bleed)   GERD (gastroesophageal reflux disease)   Essential hypertension, benign   Chronic kidney disease, stage III (moderate) (HCC)   Dyslipidemia   CVA (cerebral vascular accident) ([place])    #1  acute CVA: probably happen in the last 24 hours. Patient said the window for TPA so it was not given.He will be admitted for stroke workup. Echocardiogram and carotid Dopplers will be ordered. Aspirin and statin to be initiated. Stroke team to be consulted.  #2GERD: Continue PPI  #3 suspected peripheral neuropathy: due to multiple  vitamins deficiency frommultiple abdominal surgeries and colon resections. Lab work ordered to check vitamin B12 levels among other things. We will replete if low.  #4 dementia: Stable at baseline  #5 chronic kidney disease stage III: BUN creatinine close to baseline.Continue monitoring  #6 hypertension: Monitor blood pressure.Permissive hypertension at the moment   DVT prophylaxis: Heparin  Code Status: Full  Family Communication: Daughter at bedside  Disposition Plan: Inpatient rehab requested  Consults called: Dr Malen Gauze, Neurology  Admission status: inpatient/Tele  Severity of Illness: The appropriate patient status for this patient is INPATIENT. Inpatient status is judged to be reasonable and necessary in order to provide the required intensity of service to ensure the patient's safety. The patient's presenting symptoms, physical exam findings, and initial radiographic and laboratory data in the context of their chronic comorbidities is felt to place them at high risk for further clinical deterioration. Furthermore, it is not anticipated that the patient will be medically stable for discharge from the hospital within 2 midnights of admission. The following factors support the patient status of inpatient.   " The patient's presenting symptoms include weakness. " The worrisome physical exam findings include  facial droop and left sided weakness. " The initial radiographic and laboratory data are worrisome because of CVA on MRI. " The chronic co-morbidities include HTN, Dementia.   * I certify that at the point of admission it is my clinical judgment that the patient will require inpatient hospital care spanning beyond 2 midnights from the point of admission due to high intensity of service, high risk for further deterioration and high frequency of surveillance required.Barbette Merino MD [facility] Pager [phone]  If 7PM-7AM, please contact  night-coverage [email or web address] Password [email or web address]  04/03/2018, 10:01 PM

## 2018-04-03 NOTE — ED Notes (Signed)
Doctor at bedside.

## 2018-04-03 NOTE — ED Notes (Signed)
Patient transported to CT 

## 2018-04-03 NOTE — ED Notes (Signed)
Patient tolerated orthostatic vital signs well and when ambulating patient needed additional assistance and felt unsteady using his walker.

## 2018-04-03 NOTE — ED Notes (Signed)
Hospitalist at bedside 

## 2018-04-03 NOTE — ED Notes (Signed)
Pt given [place] sandwich and water

## 2018-04-03 NOTE — ED Notes (Signed)
Neurohospitalist at bedside

## 2018-04-03 NOTE — ED Notes (Signed)
Pt ambulated with the assistance of Ricky, EMT.  Pt required significant assistance to ambulate with the help of a walker.  His gait was unsteady and he leaned to the left the entire time ambulating.  Pt returned to the bed.  Informed Dr. Ralene Bathe.

## 2018-04-04 ENCOUNTER — Encounter (HOSPITAL_COMMUNITY): Payer: Self-pay

## 2018-04-04 ENCOUNTER — Inpatient Hospital Stay (HOSPITAL_COMMUNITY): Payer: Medicare Other

## 2018-04-04 ENCOUNTER — Other Ambulatory Visit (HOSPITAL_COMMUNITY): Payer: Medicare Other

## 2018-04-04 DIAGNOSIS — G9341 Metabolic encephalopathy: Secondary | ICD-10-CM

## 2018-04-04 DIAGNOSIS — I639 Cerebral infarction, unspecified: Secondary | ICD-10-CM

## 2018-04-04 DIAGNOSIS — J9811 Atelectasis: Secondary | ICD-10-CM

## 2018-04-04 DIAGNOSIS — E876 Hypokalemia: Secondary | ICD-10-CM

## 2018-04-04 DIAGNOSIS — I6789 Other cerebrovascular disease: Secondary | ICD-10-CM

## 2018-04-04 DIAGNOSIS — D518 Other vitamin B12 deficiency anemias: Secondary | ICD-10-CM

## 2018-04-04 LAB — CBC
HEMATOCRIT: 37 % — AB (ref 39.0–52.0)
HEMATOCRIT: 37.2 % — AB (ref 39.0–52.0)
HEMOGLOBIN: 12.4 g/dL — AB (ref 13.0–17.0)
Hemoglobin: 12.4 g/dL — ABNORMAL LOW (ref 13.0–17.0)
MCH: 29 pg (ref 26.0–34.0)
MCH: 29.4 pg (ref 26.0–34.0)
MCHC: 33.3 g/dL (ref 30.0–36.0)
MCHC: 33.5 g/dL (ref 30.0–36.0)
MCV: 86.9 fL (ref 78.0–100.0)
MCV: 87.7 fL (ref 78.0–100.0)
Platelets: 329 10*3/uL (ref 150–400)
Platelets: 332 10*3/uL (ref 150–400)
RBC: 4.22 MIL/uL (ref 4.22–5.81)
RBC: 4.28 MIL/uL (ref 4.22–5.81)
RDW: 14.7 % (ref 11.5–15.5)
RDW: 15.1 % (ref 11.5–15.5)
WBC: 7.3 10*3/uL (ref 4.0–10.5)
WBC: 7.6 10*3/uL (ref 4.0–10.5)

## 2018-04-04 LAB — COMPREHENSIVE METABOLIC PANEL
ALBUMIN: 3.3 g/dL — AB (ref 3.5–5.0)
ALT: 17 U/L (ref 17–63)
AST: 24 U/L (ref 15–41)
Alkaline Phosphatase: 110 U/L (ref 38–126)
Anion gap: 11 (ref 5–15)
BUN: 21 mg/dL — AB (ref 6–20)
CALCIUM: 8.7 mg/dL — AB (ref 8.9–10.3)
CO2: 21 mmol/L — AB (ref 22–32)
CREATININE: 1.09 mg/dL (ref 0.61–1.24)
Chloride: 105 mmol/L (ref 101–111)
GFR calc Af Amer: >60 mL/min (ref >60)
GFR calc non Af Amer: >60 mL/min (ref >60)
GLUCOSE: 109 mg/dL — AB (ref 65–99)
Potassium: 3.8 mmol/L (ref 3.5–5.1)
SODIUM: 137 mmol/L (ref 135–145)
Total Bilirubin: 0.6 mg/dL (ref 0.3–1.2)
Total Protein: 6.3 g/dL — ABNORMAL LOW (ref 6.5–8.1)

## 2018-04-04 LAB — HEMOGLOBIN A1C
Hgb A1c MFr Bld: 5.4 % (ref 4.8–5.6)
Mean Plasma Glucose: 108.28 mg/dL

## 2018-04-04 LAB — URINE CULTURE: Culture: NO GROWTH

## 2018-04-04 LAB — ECHOCARDIOGRAM COMPLETE
Height: 65 in
WEIGHTICAEL: 2800 [oz_av]

## 2018-04-04 LAB — CREATININE, SERUM
Creatinine, Ser: 1.24 mg/dL (ref 0.61–1.24)
GFR calc non Af Amer: 53 mL/min — ABNORMAL LOW (ref >60)

## 2018-04-04 LAB — TSH: TSH: 4.601 u[IU]/mL — ABNORMAL HIGH (ref 0.350–4.500)

## 2018-04-04 LAB — LIPID PANEL
CHOL/HDL RATIO: 5.2 ratio
Cholesterol: 155 mg/dL (ref 0–200)
HDL: 30 mg/dL — AB (ref >40)
LDL CALC: 70 mg/dL (ref 0–99)
Triglycerides: 276 mg/dL — ABNORMAL HIGH (ref <150)
VLDL: 55 mg/dL — AB (ref 0–40)

## 2018-04-04 LAB — TROPONIN I

## 2018-04-04 LAB — VITAMIN B12: Vitamin B-12: 295 pg/mL (ref 180–914)

## 2018-04-04 LAB — MRSA PCR SCREENING: MRSA by PCR: NEGATIVE

## 2018-04-04 MED ORDER — CYANOCOBALAMIN 1000 MCG/ML IJ SOLN
1000.0000 ug | Freq: Once | INTRAMUSCULAR | Status: AC
  Administered 2018-04-04: 1000 ug via INTRAMUSCULAR
  Filled 2018-04-04: qty 1

## 2018-04-04 MED ORDER — ASPIRIN EC 81 MG PO TBEC
81.0000 mg | DELAYED_RELEASE_TABLET | Freq: Every day | ORAL | Status: DC
  Administered 2018-04-04 – 2018-04-06 (×3): 81 mg via ORAL
  Filled 2018-04-04 (×3): qty 1

## 2018-04-04 MED ORDER — CLOPIDOGREL BISULFATE 75 MG PO TABS
75.0000 mg | ORAL_TABLET | Freq: Every day | ORAL | Status: DC
  Administered 2018-04-04 – 2018-04-06 (×3): 75 mg via ORAL
  Filled 2018-04-04 (×2): qty 1

## 2018-04-04 MED ORDER — VITAMIN B-12 100 MCG PO TABS
500.0000 ug | ORAL_TABLET | Freq: Every day | ORAL | Status: DC
  Administered 2018-04-04 – 2018-04-06 (×3): 500 ug via ORAL
  Filled 2018-04-04 (×3): qty 5

## 2018-04-04 MED ORDER — PRAVASTATIN SODIUM 20 MG PO TABS
10.0000 mg | ORAL_TABLET | Freq: Every day | ORAL | Status: DC
  Administered 2018-04-04: 10 mg via ORAL
  Filled 2018-04-04: qty 1

## 2018-04-04 NOTE — Progress Notes (Signed)
OT Cancellation Note  Patient Details Name: Anthony Hayes MRN: [id number] DOB: Jun 10, 1935   Cancelled Treatment:    Reason Eval/Treat Not Completed: Patient at procedure or test/ unavailable. Upon arrival, transportation taking pt off unit for [place]  [name] Bottom 04/04/2018, 11:24 AM

## 2018-04-04 NOTE — Progress Notes (Addendum)
*  PRELIMINARY RESULTS* Vascular Ultrasound Carotid Duplex (Doppler) has been completed.   Findings suggest 1-39% internal carotid artery stenosis bilaterally. Vertebral arteries are patent with antegrade flow.  Transcranial duplex has been completed.  04/04/2018 12:28 PM Maudry Mayhew, BS, RVT, RDCS, RDMS

## 2018-04-04 NOTE — Progress Notes (Addendum)
STROKE TEAM PROGRESS NOTE   SUBJECTIVE (INTERVAL HISTORY) His wife and daughter are at the bedside.  They feel with mental status is back to his baseline. Patient is a retired Software engineer.. He has arisen resident at [facility] assisted living facility. His daughter is a Astronomer from [place]. Wife and daughter open to inpatient rehabilitation prior to return to assisted living; that is their preference. Diagnoses and plan discussed with wife and daughter.   OBJECTIVE Vitals:   04/04/18 0800 04/04/18 0808 04/04/18 1000 04/04/18 1236  BP: (!) 162/82 (!) 168/82 (!) 149/83 (!) 151/86  Pulse:  (!) 53 61   Resp:  17    Temp: 98.3 F (36.8 C) 98.3 F (36.8 C)    TempSrc: Oral Oral    SpO2: 96% 96%    Weight:      Height:        CBC:  Recent Labs  Lab 04/03/18 1019  04/03/18 2337 04/04/18 0608  WBC 7.7  --  7.6 7.3  NEUTROABS 4.8  --   --   --   HGB 13.4   < > 12.4* 12.4*  HCT 40.1   < > 37.0* 37.2*  MCV 87.6  --  87.7 86.9  PLT 354  --  329 332   < > = values in this interval not displayed.    Basic Metabolic Panel:  Recent Labs  Lab 04/03/18 1019 04/03/18 1028 04/03/18 2337 04/04/18 0608  NA 136 139  --  137  K 4.0 4.1  --  3.8  CL 102 102  --  105  CO2 24  --   --  21*  GLUCOSE 87 87  --  109*  BUN 21* 22*  --  21*  CREATININE 1.22 1.30* 1.24 1.09  CALCIUM 9.4  --   --  8.7*    Lipid Panel:     Component Value Date/Time   CHOL 155 04/04/2018 0608   TRIG 276 (H) 04/04/2018 0608   HDL 30 (L) 04/04/2018 0608   CHOLHDL 5.2 04/04/2018 0608   VLDL 55 (H) 04/04/2018 0608   LDLCALC 70 04/04/2018 0608   HgbA1c:  Lab Results  Component Value Date   HGBA1C 5.4 04/04/2018   Urine Drug Screen: No results found for: LABOPIA, COCAINSCRNUR, LABBENZ, AMPHETMU, THCU, LABBARB  Alcohol Level No results found for: [facility]  IMAGING  Dg Chest 2 View 04/03/2018 No active cardiopulmonary disease.   Ct Head Wo Contrast 04/03/2018 1. No acute intracranial  abnormalities. 2. Atrophy and chronic microvascular ischemic change. Stable appearance from the prior study.   Mr Brain Wo Contrast (neuro Protocol) 04/03/2018 Small areas of acute infarction affecting the posterior right basal ganglia and radiating white matter tracts on the right. No swelling or hemorrhage. Extensive chronic small-vessel ischemic changes elsewhere throughout the brain as outlined above.   2-D echocardiogram Pending  Carotid Doppler   There is 1-39% bilateral ICA stenosis. Vertebral artery flow is antegrade.    Transcranial Doppler Pending   PHYSICAL EXAM GENERAL: Awake, alert in NAD HEENT: - Normocephalic and atraumatic, dry mm LUNGS - Clear to auscultation bilaterally with no wheezes CV - S1S2 RRR, no m/r/g, equal pulses bilaterally. Ext: warm, well perfused, intact peripheral pulses, no edema NEURO:  Mental Status: Awake, alert, oriented x2. Was able to tell me the name of the president.   Was able to tell me the month-April and the next holiday of Easter; able to state the year. Speech is clear.  Naming, repetition  comprehension intact. Can follow one, 2 and three-step commands. Cranial Nerves: PERRL EOMI, visual fields full, mild lower facial weakness on the left at rest which is overcome by smiling, facial sensation intact, hearing intact, tongue/uvula/soft palate midline, normal sternocleidomastoid and trapezius muscle strength. No evidence of tongue atrophy or fibrillations Motor: strength 5/5 on the R, LUE 4/5 and LLE 4/5, There is a drift in left upper extremity.  Tone: symmetric Sensation- decreased bilaterally but symmetric Coordination: FTN decreased LUE, ok RUE.  Gait- deferred. Pain in joints limit standing   ASSESSMENT/PLAN [name] is a 83 y.o. L handed white male with history of hypertension, hyperlipidemia, TIA, BPH, GERD, chronic kidney disease stage III, hypothyroidism, Crohn's disease status post colectomy as a teenager, suspected  peripheral neuropathy, and former smoker presenting after waking with left hemiparesis and difficulty walking.   Stroke:  right basal ganglia white matter infarct secondary to small vessel disease source  Resultant  Left hemiparesis  CT head Small vessel disease. Atrophy.   MRI head right basal ganglia white matter infarct. Extensive small vessel disease  TCD pending   Carotid Doppler  B ICA 1-39% stenosis, VAs antegrade   2D Echo  pending   LDL 70  HgbA1c 5.4  Heparin 5000 units sq tid for VTE prophylaxis  Fall precautions  Diet Heart Room service appropriate? Yes; Fluid consistency: Thin  aspirin 81 mg daily prior to admission, now on aspirin 300 mg suppository daily. Given mild stroke with NIHSS < 5, will place on aspirin 81 mg and plavix 75 mg daily x 3 weeks, then plavix alone.   Patient counseled to be compliant with his antithrombotic medications  Ongoing aggressive stroke risk factor management  Therapy recommendations:  CIR. Consult order placed  Disposition:  CIR  Hypertension  Stable . Permissive hypertension (OK if < 220/120) but gradually normalize in 5-7 days . Long-term BP goal normotensive  Hyperlipidemia  Home meds:  No statin  LDL 70, goal < 70  Pravachol 10 mg daily added  Continue statin at discharge  Other Stroke Risk Factors  Advanced age  Former Cigarette smoker, quit 40 years ago  ETOH use, advised to drink no more than 2 drink(s) a day  Hx TIA  Other Active Problems  Vitamin B-12 deficiency. Had been getting B-12 shots in the past. Plan outpatient monthly B-12 shots. Dr. Erlinda Hayes requested 500 mg po VB 12 be added to regimen. Added this am. Family aware.  Chronic kidney disease stage III  Hospital day # 1  Anthony Sabin, MSN, APRN, ANVP-BC, AGPCNP-BC Advanced Practice Stroke Nurse [place] for Schedule & Pager information 04/04/2018 2:41 PM   ATTENDING NOTE: I reviewed above note and agree with the  assessment and plan. I have made any additions or clarifications directly to the above note. Pt was seen and examined.    83 year old left handed male with history of BPH, HLD, HTN, CKD, dementia, history of TIA/stroke without residual, status post partial bowel resection due to ? Crohn's disease admitted for left-sided weakness and difficulty walking.  MRI showed right BG/CR acute infarct, as well as old left thalamic infarct.  Carotid Doppler unremarkable.  TCD pending.  2D echo pending.  LDL 70 and A1c 5.4 B12 295.  Creatinine 1.3.  His stroke most consistent with small vessel disease. He has ASA 28m PTA, will recommend DAPT with ASA 81 and plavix for 3 weeks and then plavix alone. Also put on low dose pravastatin. Stroke risk  factor modification. PT/OT recommend CIR. Will follow.  Rosalin Hawking, MD PhD Stroke Neurology 04/04/2018 3:45 PM  To contact Stroke Continuity provider, please refer to [email or web address] After hours, contact General Neurology

## 2018-04-04 NOTE — Progress Notes (Signed)
  Echocardiogram 2D Echocardiogram has been performed.  Anthony Hayes 04/04/2018, 11:49 AM

## 2018-04-04 NOTE — Progress Notes (Signed)
PROGRESS NOTE                                                                                                                                                                                                             Patient Demographics:    Anthony Hayes, is a 83 y.o. male, DOB - Mar 17, 1935, MCN:[id number]  Admit date - 04/03/2018   Admitting Physician Elwyn Reach, MD  Outpatient Primary MD for the patient is System, Pcp Not In  LOS - 1  Outpatient Specialists: none  Chief Complaint  Patient presents with  . Extremity Weakness       Brief Narrative   83 y/o male with prior CVA, BPH, multiple colonic resection for crohn's ds,  and dementia, resident of assisted living brought to ED eiyth acute change in menta status and dizzy. MRI brain showed small rt posterior basal basal ganglia stroke.    Subjective:   Frustrated about his left sided ( dominant)  Weakness.   Assessment  & Plan :    Principal Problem: Acute ischemic stroke [facility]) MRI brain shows Small areas of acute infarction affecting the posterior right basal ganglia and radiating white matter tracts on the right. Extensive chronic small vessel ischemic changes. 2d echo pending, carotid doppler without significant stenosis.  on baby aspirin at home. Stroke team recommends this is likely small vessel disease, plan on TCD and if unremarkable recommend 3 weeks fo DAPT followed by single agent ( likely plavix) thereafter. continue lipitor. LDL of 70 with high TAG..A1C of 5.4  PT recommends CIR, awaiting authorization.   Active Problems: vitamin b12 deficiency Has some peripheral neuropathy. Low B12 ( per daughter was getting monthly b12 shots while he lived independently 1 year back). Given a dose of sq B12, will need monthly injections as outpt. ( cannot absorb po due to colonic resection).  Chronic kidney disease stage III At baseline   essential  hypertension  stable. allow permissive BP.      Code Status : Full code  Family Communication  : daughter at bedside  Disposition Plan  : CIR once work up complete and approved. ( possible tomorrow)  Barriers For Discharge : CIR eval and authorization, pending w/up ( echo results and TCD)  Consults  :  stroke  Procedures  : CT head, MRI brain, US carotid,  echo, TCD  DVT Prophylaxis  :  Lovenox -  Lab Results  Component Value Date   PLT 332 04/04/2018    Antibiotics  :   Anti-infectives (From admission, onward)   None        Objective:   Vitals:   04/04/18 0700 04/04/18 0800 04/04/18 0808 04/04/18 1000  BP:  (!) 162/82 (!) 168/82 (!) 149/83  Pulse:   (!) 53 61  Resp: 14  17   Temp:  98.3 F (36.8 C) 98.3 F (36.8 C)   TempSrc:  Oral Oral   SpO2:  96% 96%   Weight:      Height:        Wt Readings from Last 3 Encounters:  04/03/18 79.4 kg (175 lb)  11/09/17 75.3 kg (166 lb)  10/22/17 68.9 kg (152 lb)     Intake/Output Summary (Last 24 hours) at 04/04/2018 1139 Last data filed at 04/04/2018 0923 Gross per 24 hour  Intake 843.33 ml  Output 300 ml  Net 543.33 ml     Physical Exam  Gen: not in distress HEENT:  moist mucosa, supple neck Chest: clear b/l, no added sounds CVS: N S1&S2, no murmurs,  GI: soft, NT, ND,  Musculoskeletal: warm, no edema CNS: AAOX3, 4+/5 power over left UE,     Data Review:    CBC Recent Labs  Lab 04/03/18 1019 04/03/18 1028 04/03/18 2337 04/04/18 0608  WBC 7.7  --  7.6 7.3  HGB 13.4 13.9 12.4* 12.4*  HCT 40.1 41.0 37.0* 37.2*  PLT 354  --  329 332  MCV 87.6  --  87.7 86.9  MCH 29.3  --  29.4 29.0  MCHC 33.4  --  33.5 33.3  RDW 14.8  --  15.1 14.7  LYMPHSABS 1.6  --   --   --   MONOABS 0.7  --   --   --   EOSABS 0.5  --   --   --   BASOSABS 0.0  --   --   --     Chemistries  Recent Labs  Lab 04/03/18 1019 04/03/18 1028 04/03/18 2337 04/04/18 0608  NA 136 139  --  137  K 4.0 4.1  --  3.8  CL 102  102  --  105  CO2 24  --   --  21*  GLUCOSE 87 87  --  109*  BUN 21* 22*  --  21*  CREATININE 1.22 1.30* 1.24 1.09  CALCIUM 9.4  --   --  8.7*  AST 28  --   --  24  ALT 17  --   --  17  ALKPHOS 129*  --   --  110  BILITOT 0.5  --   --  0.6   ------------------------------------------------------------------------------------------------------------------ Recent Labs    04/04/18 0608  CHOL 155  HDL 30*  LDLCALC 70  TRIG 276*  CHOLHDL 5.2    Lab Results  Component Value Date   HGBA1C 5.4 04/04/2018   ------------------------------------------------------------------------------------------------------------------ Recent Labs    04/04/18 0608  TSH 4.601*   ------------------------------------------------------------------------------------------------------------------ Recent Labs    04/03/18 2322  VITAMINB12 295    Coagulation profile Recent Labs  Lab 04/03/18 1019  INR 1.05    No results for input(s): DDIMER in the last 72 hours.  Cardiac Enzymes Recent Labs  Lab 04/03/18 2337  TROPONINI <0.03   ------------------------------------------------------------------------------------------------------------------ No results found for: BNP  Inpatient Medications  Scheduled Meds: . aspirin EC  81 mg  Oral Daily  . heparin  5,000 Units Subcutaneous Q8H  . hydrochlorothiazide  25 mg Oral Daily  . imipramine  25 mg Oral QHS  . ipratropium  2 spray Each Nare QID  . levothyroxine  25 mcg Oral QAC breakfast  . loratadine  10 mg Oral Daily  . oxybutynin  5 mg Oral Daily  . pantoprazole  40 mg Oral BID  . pravastatin  10 mg Oral q1800  . tamsulosin  0.4 mg Oral Daily  . vitamin B-12  500 mcg Oral Daily   Continuous Infusions: . sodium chloride 50 mL/hr at 04/04/18 1012   PRN Meds:.acetaminophen **OR** acetaminophen (TYLENOL) oral liquid 160 mg/5 mL **OR** acetaminophen, guaiFENesin-dextromethorphan, senna-docusate  Micro Results Recent Results (from the  past 240 hour(s))  Urine culture     Status: None   Collection Time: 04/03/18  1:57 PM  Result Value Ref Range Status   Specimen Description URINE, CATHETERIZED  Final   Special Requests NONE  Final   Culture   Final    NO GROWTH Performed at [facility], [address], [place], [place] [address]    Report Status 04/04/2018 FINAL  Final  MRSA PCR Screening     Status: None   Collection Time: 04/04/18  3:13 AM  Result Value Ref Range Status   MRSA by PCR NEGATIVE NEGATIVE Final    Comment:        The GeneXpert MRSA Assay (FDA approved for NASAL specimens only), is one component of a comprehensive MRSA colonization surveillance program. It is not intended to diagnose MRSA infection nor to guide or monitor treatment for MRSA infections. Performed at [facility], [place] [address] [place], [place] [address]     Radiology Reports Dg Chest 2 View  Result Date: 04/03/2018 CLINICAL DATA:  Bilateral arm weakness. EXAM: CHEST - 2 VIEW COMPARISON:  February 02, 2018 FINDINGS: The heart, hila, and mediastinum are normal. No pulmonary nodules or masses. No focal infiltrates. IMPRESSION: No active cardiopulmonary disease. Electronically Signed   By: Dorise Bullion III M.D   On: 04/03/2018 12:53   Ct Head Wo Contrast  Result Date: 04/03/2018 CLINICAL DATA:  Pt has weakness in all extremities and problem getting his spoon or fork to his mouth EXAM: CT HEAD WITHOUT CONTRAST TECHNIQUE: Contiguous axial images were obtained from the base of the skull through the vertex without intravenous contrast. COMPARISON:  10/18/2017 FINDINGS: Brain: No evidence of acute infarction, hemorrhage, hydrocephalus, extra-axial collection or mass lesion/mass effect. There is ventricular sulcal enlargement reflecting mild diffuse atrophy. Patchy white matter hypoattenuation is also noted consistent with moderate chronic microvascular ischemic change. Vascular: No hyperdense vessel or unexpected  calcification. Skull: Normal. Negative for fracture or focal lesion. Sinuses/Orbits: Globes and orbits are unremarkable. Visualized sinuses and mastoid air cells are clear. Other: None. IMPRESSION: 1. No acute intracranial abnormalities. 2. Atrophy and chronic microvascular ischemic change. Stable appearance from the prior study. Electronically Signed   By: Lajean Manes M.D.   On: 04/03/2018 12:37   Mr Brain Wo Contrast (neuro Protocol)  Result Date: 04/03/2018 CLINICAL DATA:  Weakness in the arms and legs today. EXAM: MRI HEAD WITHOUT CONTRAST TECHNIQUE: Multiplanar, multiecho pulse sequences of the brain and surrounding structures were obtained without intravenous contrast. COMPARISON:  CT 04/03/2018 FINDINGS: Brain: Diffusion imaging shows areas of acute infarction on the right affecting the posterior basal ganglia and radiating white matter tracts. No cortical or large vessel territory infarction. There chronic small-vessel ischemic  changes throughout the pons. No focal cerebellar insult. There is an old lacunar infarction in the left thalamus. Cerebral hemispheres elsewhere show chronic small-vessel ischemic changes affecting the basal ganglia and throughout the cerebral hemispheric white matter. Few punctate foci of hemosiderin deposition associated with some of the old strokes. No sign of mass lesion, acute hemorrhage, hydrocephalus or extra-axial collection. Vascular: Major vessels at the base of the brain show flow. Skull and upper cervical spine: Negative Sinuses/Orbits: Clear/normal Other: None IMPRESSION: Small areas of acute infarction affecting the posterior right basal ganglia and radiating white matter tracts on the right. No swelling or hemorrhage. Extensive chronic small-vessel ischemic changes elsewhere throughout the brain as outlined above. Electronically Signed   By: Nelson Chimes M.D.   On: 04/03/2018 20:57    Time Spent in minutes  25   Ashira Kirsten M.D on 04/04/2018 at 11:39  AM  Between 7am to 7pm - Pager - [phone]  After 7pm go to [email or web address] - password [facility]  [facility] -  Office  [phone]

## 2018-04-04 NOTE — Consult Note (Signed)
Physical Medicine and Rehabilitation Consult Reason for Consult: Decreased functional mobility with altered mental status Referring Physician: [facility]   HPI: Anthony Hayes is a 83 y.o. right-handed male with history of mild dementia, previous CVA, multiple colon resections due to Crohn's disease.  Patient is a resident at [facility] assisted living facility.  Patient was able to complete his own bathing dressing and walks to the dining hall for his meals using a rolling walker.  He does not drive.  Presented 04/03/2018 with altered mental status.  There has been reports of dizziness.  Cranial CT scan showed no acute changes.  MRI showed small areas of acute infarction affecting the posterior right basal ganglia and radiating white matter tracts on the right.  No swelling or hemorrhage.  Extensive chronic small vessel ischemic changes.  Patient did not receive TPA.  Echocardiogram is currently pending.  Maintained on aspirin for CVA prophylaxis.  Neurology consulted with workup currently ongoing.  Physical therapy evaluation completed 04/04/2018 with recommendations of physical medicine rehab consult.   Review of Systems  Constitutional: Positive for diaphoresis. Negative for chills and fever.  HENT: Negative for hearing loss.   Eyes: Negative for blurred vision and double vision.  Respiratory: Negative for cough.   Cardiovascular: Negative for chest pain, palpitations and leg swelling.  Gastrointestinal: Positive for constipation. Negative for nausea.       GERD  Genitourinary: Positive for urgency. Negative for dysuria and hematuria.  Musculoskeletal: Positive for joint pain and myalgias.  Skin: Negative for rash.  Neurological: Negative for seizures.  Psychiatric/Behavioral: Positive for depression and memory loss.  All other systems reviewed and are negative.  Past Medical History:  Diagnosis Date  . Arthritis    "joints" (10/15/2017)  . Benign prostatic hyperplasia   .  Complication of anesthesia    "last OR in 06/2017 affected him cognitively; hasn't got back to baseline yet" (10/15/2017)  . Dementia   . Depression   . GERD (gastroesophageal reflux disease)   . High cholesterol   . Hypertension   . Hypothyroidism   . Renal disease   . Renal disorder   . TIA (transient ischemic attack) early 2000s   Past Surgical History:  Procedure Laterality Date  . ABDOMINAL EXPLORATION SURGERY  7/  . APPENDECTOMY    . CATARACT EXTRACTION W/ INTRAOCULAR LENS  IMPLANT, BILATERAL Bilateral   . COLECTOMY  1950s   "thought he had iteilis"  . ESOPHAGOGASTRODUODENOSCOPY N/A 10/22/2017   Procedure: ESOPHAGOGASTRODUODENOSCOPY (EGD);  Surgeon: Gatha Mayer, MD;  Location: [facility] ENDOSCOPY;  Service: Endoscopy;  Laterality: N/A;  . ESOPHAGOGASTRODUODENOSCOPY (EGD) WITH PROPOFOL N/A 10/16/2017   Procedure: ESOPHAGOGASTRODUODENOSCOPY (EGD) WITH PROPOFOL;  Surgeon: Milus Banister, MD;  Location: [facility] ENDOSCOPY;  Service: Endoscopy;  Laterality: N/A;  . FEMUR FRACTURE SURGERY Right 1950s  . FRACTURE SURGERY    . KNEE ARTHROSCOPY Left   . POSTERIOR LUMBAR FUSION    . ROTATOR CUFF REPAIR Left   . TONSILLECTOMY     No family history on file. Social History:  reports that he quit smoking about 56 years ago. His smoking use included cigarettes. He quit after 7.00 years of use. He has never used smokeless tobacco. He reports that he drinks alcohol. He reports that he does not use drugs. Allergies:  Allergies  Allergen Reactions  . Lisinopril Swelling    Swelling of lips    Medications Prior to Admission  Medication Sig Dispense Refill  . aspirin EC 81 MG  tablet Take 81 mg by mouth daily.    . cetirizine (ZYRTEC) 10 MG tablet Take 10 mg by mouth daily.    . hydrochlorothiazide (HYDRODIURIL) 25 MG tablet Take 25 mg by mouth daily.    Marland Kitchen imipramine (TOFRANIL) 25 MG tablet Take 25 mg by mouth at bedtime.    Marland Kitchen ipratropium (ATROVENT) 0.06 % nasal spray Place 2 sprays into both  nostrils 4 (four) times daily.    Marland Kitchen levothyroxine (SYNTHROID, LEVOTHROID) 50 MCG tablet Take 25 mcg by mouth every morning.     Marland Kitchen oxybutynin (DITROPAN) 5 MG tablet Take 5 mg by mouth every morning.    . pantoprazole (PROTONIX) 40 MG tablet Take 1 tablet (40 mg total) by mouth 2 (two) times daily. 60 tablet 0  . promethazine-dextromethorphan (PROMETHAZINE-DM) 6.25-15 MG/5ML syrup Take 10 mLs by mouth 4 (four) times daily as needed for cough.    . tamsulosin (FLOMAX) 0.4 MG CAPS capsule Take 1 capsule (0.4 mg total) by mouth daily. 14 capsule 0  . cephALEXin (KEFLEX) 500 MG capsule Take 1 capsule (500 mg total) by mouth 4 (four) times daily. (Patient not taking: Reported on 04/03/2018) 28 capsule 0    Home: Home Living Family/patient expects to be discharged to:: Inpatient rehab Additional Comments: pt resident at [place]. Pt reports he was doing own dressing/bathing, feeding, and walking to dinning all.  Functional History: Prior Function Level of Independence: Independent with assistive device(s) Comments: uses rollator and completes own dressing and bathing Functional Status:  Mobility: Bed Mobility Overal bed mobility: Needs Assistance Bed Mobility: Supine to Sit Supine to sit: Min assist General bed mobility comments: pt with decreased frunction of L UE requiring minA for trunk elevation and to scoot to EOB to get feet flat on floor Transfers Overall transfer level: Needs assistance Equipment used: Rolling walker (2 wheeled) Transfers: Sit to/from Stand Sit to Stand: Min assist General transfer comment: v/cs to push up from chair/bed, minA to power up and steady during transition of hands from bed to RW Ambulation/Gait Ambulation/Gait assistance: Min assist Ambulation Distance (Feet): 120 Feet Assistive device: 4-wheeled walker Gait Pattern/deviations: Step-through pattern, Decreased stride length General Gait Details: pt required min/modA to maintain L hand on RW due to  impaired grip strength. Pt able to clear both LEs but noted deceased step length Gait velocity: slow Gait velocity interpretation: Below normal speed for age/gender    ADL:    Cognition: Cognition Overall Cognitive Status: Impaired/Different from baseline Orientation Level: Oriented X4 Cognition Arousal/Alertness: Awake/alert Behavior During Therapy: Flat affect Overall Cognitive Status: Impaired/Different from baseline Area of Impairment: Orientation, Problem solving Orientation Level: Disoriented to, Situation Problem Solving: Slow processing, Decreased initiation, Difficulty sequencing, Requires verbal cues, Requires tactile cues General Comments: pt with stool incontinence but aware he needed ot use bathroom  Blood pressure (!) 149/83, pulse 61, temperature 98.3 F (36.8 C), temperature source Oral, resp. rate 17, height 5' 5"  (1.651 m), weight 79.4 kg (175 lb), SpO2 96 %. Physical Exam  Vitals reviewed. Constitutional: He appears well-developed.  HENT:  Head: Normocephalic.  Eyes: EOM are normal. Right eye exhibits no discharge. Left eye exhibits no discharge.  Neck: Normal range of motion. Neck supple. No thyromegaly present.  Cardiovascular: Normal rate, regular rhythm and normal heart sounds.  Respiratory: Effort normal and breath sounds normal. No respiratory distress.  GI: Soft. Bowel sounds are normal. He exhibits no distension.  Neurological: He is alert.  Patient provides his name.  Follows simple commands.  Makes good eye contact with examiner. Left hemiparesis  Skin: Skin is warm and dry.    Results for orders placed or performed during the hospital encounter of 04/03/18 (from the past 24 hour(s))  Urinalysis, Routine w reflex microscopic     Status: None   Collection Time: 04/03/18  2:57 PM  Result Value Ref Range   Color, Urine YELLOW YELLOW   APPearance CLEAR CLEAR   Specific Gravity, Urine 1.010 1.005 - 1.030   pH 5.0 5.0 - 8.0   Glucose, UA NEGATIVE  NEGATIVE mg/dL   Hgb urine dipstick NEGATIVE NEGATIVE   Bilirubin Urine NEGATIVE NEGATIVE   Ketones, ur NEGATIVE NEGATIVE mg/dL   Protein, ur NEGATIVE NEGATIVE mg/dL   Nitrite NEGATIVE NEGATIVE   Leukocytes, UA NEGATIVE NEGATIVE  Vitamin B12     Status: None   Collection Time: 04/03/18 11:22 PM  Result Value Ref Range   Vitamin B-12 295 180 - 914 pg/mL  CBC     Status: Abnormal   Collection Time: 04/03/18 11:37 PM  Result Value Ref Range   WBC 7.6 4.0 - 10.5 K/uL   RBC 4.22 4.22 - 5.81 MIL/uL   Hemoglobin 12.4 (L) 13.0 - 17.0 g/dL   HCT 37.0 (L) 39.0 - 52.0 %   MCV 87.7 78.0 - 100.0 fL   MCH 29.4 26.0 - 34.0 pg   MCHC 33.5 30.0 - 36.0 g/dL   RDW 15.1 11.5 - 15.5 %   Platelets 329 150 - 400 K/uL  Creatinine, serum     Status: Abnormal   Collection Time: 04/03/18 11:37 PM  Result Value Ref Range   Creatinine, Ser 1.24 0.61 - 1.24 mg/dL   GFR calc non Af Amer 53 (L) >60 mL/min   GFR calc Af Amer >60 >60 mL/min  Troponin I     Status: None   Collection Time: 04/03/18 11:37 PM  Result Value Ref Range   Troponin I <0.03 <0.03 ng/mL  MRSA PCR Screening     Status: None   Collection Time: 04/04/18  3:13 AM  Result Value Ref Range   MRSA by PCR NEGATIVE NEGATIVE  Hemoglobin A1c     Status: None   Collection Time: 04/04/18  6:08 AM  Result Value Ref Range   Hgb A1c MFr Bld 5.4 4.8 - 5.6 %   Mean Plasma Glucose 108.28 mg/dL  Lipid panel     Status: Abnormal   Collection Time: 04/04/18  6:08 AM  Result Value Ref Range   Cholesterol 155 0 - 200 mg/dL   Triglycerides 276 (H) <150 mg/dL   HDL 30 (L) >40 mg/dL   Total CHOL/HDL Ratio 5.2 RATIO   VLDL 55 (H) 0 - 40 mg/dL   LDL Cholesterol 70 0 - 99 mg/dL  CBC     Status: Abnormal   Collection Time: 04/04/18  6:08 AM  Result Value Ref Range   WBC 7.3 4.0 - 10.5 K/uL   RBC 4.28 4.22 - 5.81 MIL/uL   Hemoglobin 12.4 (L) 13.0 - 17.0 g/dL   HCT 37.2 (L) 39.0 - 52.0 %   MCV 86.9 78.0 - 100.0 fL   MCH 29.0 26.0 - 34.0 pg   MCHC 33.3  30.0 - 36.0 g/dL   RDW 14.7 11.5 - 15.5 %   Platelets 332 150 - 400 K/uL  Comprehensive metabolic panel     Status: Abnormal   Collection Time: 04/04/18  6:08 AM  Result Value Ref Range   Sodium 137 135 - 145  mmol/L   Potassium 3.8 3.5 - 5.1 mmol/L   Chloride 105 101 - 111 mmol/L   CO2 21 (L) 22 - 32 mmol/L   Glucose, Bld 109 (H) 65 - 99 mg/dL   BUN 21 (H) 6 - 20 mg/dL   Creatinine, Ser 1.09 0.61 - 1.24 mg/dL   Calcium 8.7 (L) 8.9 - 10.3 mg/dL   Total Protein 6.3 (L) 6.5 - 8.1 g/dL   Albumin 3.3 (L) 3.5 - 5.0 g/dL   AST 24 15 - 41 U/L   ALT 17 17 - 63 U/L   Alkaline Phosphatase 110 38 - 126 U/L   Total Bilirubin 0.6 0.3 - 1.2 mg/dL   GFR calc non Af Amer >60 >60 mL/min   GFR calc Af Amer >60 >60 mL/min   Anion gap 11 5 - 15  TSH     Status: Abnormal   Collection Time: 04/04/18  6:08 AM  Result Value Ref Range   TSH 4.601 (H) 0.350 - 4.500 uIU/mL   Dg Chest 2 View  Result Date: 04/03/2018 CLINICAL DATA:  Bilateral arm weakness. EXAM: CHEST - 2 VIEW COMPARISON:  February 02, 2018 FINDINGS: The heart, hila, and mediastinum are normal. No pulmonary nodules or masses. No focal infiltrates. IMPRESSION: No active cardiopulmonary disease. Electronically Signed   By: Dorise Bullion III M.D   On: 04/03/2018 12:53   Ct Head Wo Contrast  Result Date: 04/03/2018 CLINICAL DATA:  Pt has weakness in all extremities and problem getting his spoon or fork to his mouth EXAM: CT HEAD WITHOUT CONTRAST TECHNIQUE: Contiguous axial images were obtained from the base of the skull through the vertex without intravenous contrast. COMPARISON:  10/18/2017 FINDINGS: Brain: No evidence of acute infarction, hemorrhage, hydrocephalus, extra-axial collection or mass lesion/mass effect. There is ventricular sulcal enlargement reflecting mild diffuse atrophy. Patchy white matter hypoattenuation is also noted consistent with moderate chronic microvascular ischemic change. Vascular: No hyperdense vessel or unexpected  calcification. Skull: Normal. Negative for fracture or focal lesion. Sinuses/Orbits: Globes and orbits are unremarkable. Visualized sinuses and mastoid air cells are clear. Other: None. IMPRESSION: 1. No acute intracranial abnormalities. 2. Atrophy and chronic microvascular ischemic change. Stable appearance from the prior study. Electronically Signed   By: Lajean Manes M.D.   On: 04/03/2018 12:37   Mr Brain Wo Contrast (neuro Protocol)  Result Date: 04/03/2018 CLINICAL DATA:  Weakness in the arms and legs today. EXAM: MRI HEAD WITHOUT CONTRAST TECHNIQUE: Multiplanar, multiecho pulse sequences of the brain and surrounding structures were obtained without intravenous contrast. COMPARISON:  CT 04/03/2018 FINDINGS: Brain: Diffusion imaging shows areas of acute infarction on the right affecting the posterior basal ganglia and radiating white matter tracts. No cortical or large vessel territory infarction. There chronic small-vessel ischemic changes throughout the pons. No focal cerebellar insult. There is an old lacunar infarction in the left thalamus. Cerebral hemispheres elsewhere show chronic small-vessel ischemic changes affecting the basal ganglia and throughout the cerebral hemispheric white matter. Few punctate foci of hemosiderin deposition associated with some of the old strokes. No sign of mass lesion, acute hemorrhage, hydrocephalus or extra-axial collection. Vascular: Major vessels at the base of the brain show flow. Skull and upper cervical spine: Negative Sinuses/Orbits: Clear/normal Other: None IMPRESSION: Small areas of acute infarction affecting the posterior right basal ganglia and radiating white matter tracts on the right. No swelling or hemorrhage. Extensive chronic small-vessel ischemic changes elsewhere throughout the brain as outlined above. Electronically Signed   By: Jan Fireman.D.  On: 04/03/2018 20:57    Assessment/Plan: Diagnosis: Right basal ganglia infarct 1. Does the need for  close, 24 hr/day medical supervision in concert with the patient's rehab needs make it unreasonable for this patient to be served in a less intensive setting? Yes 2. Co-Morbidities requiring supervision/potential complications: HTN, CKD III, mild dementia, post-stroke sequelae 3. Due to bladder management, bowel management, safety, skin/wound care, disease management, medication administration, pain management and patient education, does the patient require 24 hr/day rehab nursing? Yes 4. Does the patient require coordinated care of a physician, rehab nurse, PT (1-2 hrs/day, 5 days/week) and OT (1-2 hrs/day, 5 days/week) to address physical and functional deficits in the context of the above medical diagnosis(es)? Yes Addressing deficits in the following areas: balance, endurance, locomotion, strength, transferring, bowel/bladder control, bathing, dressing, feeding, grooming, toileting and psychosocial support 5. Can the patient actively participate in an intensive therapy program of at least 3 hrs of therapy per day at least 5 days per week? Yes 6. The potential for patient to make measurable gains while on inpatient rehab is excellent 7. Anticipated functional outcomes upon discharge from inpatient rehab are modified independent  with PT, modified independent with OT, n/a with SLP. 8. Estimated rehab length of stay to reach the above functional goals is: 7-10 days 9. Anticipated D/C setting: Home 10. Anticipated post D/C treatments: HH therapy and Outpatient therapy 11. Overall Rehab/Functional Prognosis: excellent  RECOMMENDATIONS: This patient's condition is appropriate for continued rehabilitative care in the following setting: CIR Patient has agreed to participate in recommended program. Yes Note that insurance prior authorization may be required for reimbursement for recommended care.  Comment: Rehab Admissions Coordinator to follow up.  Thanks,  Meredith Staggers, MD, Mellody Drown     Lavon Paganini Angiulli, PA-C 04/04/2018

## 2018-04-04 NOTE — Progress Notes (Signed)
Inpatient Rehabilitation  Met with patient and daughter, [name] at bedside to discuss team's recommendation for IP Rehab.  Shared booklets and answered initial questions.  Plan to follow for timing of medical readiness and IP Rehab bed availability, hopeful for potential admission tomorrow.  Call if questions.    Carmelia Roller., CCC/SLP Admission Coordinator  [facility]  Cell [phone]

## 2018-04-04 NOTE — Evaluation (Signed)
Speech Language Pathology Evaluation Patient Details Name: Anthony Hayes MRN: [id number] DOB: Mar 22, 1935 Today's Date: 04/04/2018 Time: 7169-6789 SLP Time Calculation (min) (ACUTE ONLY): 17 min  Problem List:  Patient Active Problem List   Diagnosis Date Noted  . CVA (cerebral vascular accident) ([place]) 04/03/2018  . Calculus, kidney 01/21/2018  . Acute blood loss anemia   . Mallory-Weiss syndrome   . Esophageal stricture   . Respiratory failure ([place])   . UGIB (upper gastrointestinal bleed) 10/16/2017  . GERD (gastroesophageal reflux disease) 10/16/2017  . Essential hypertension, benign 10/16/2017  . Chest pain 10/15/2017  . Hypothyroidism (acquired) 06/08/2017  . Cerebral vascular disease 07/22/2016  . Chronic kidney disease, stage III (moderate) ([place]) 03/19/2016  . Dyslipidemia 03/19/2016  . Degeneration of lumbar or lumbosacral intervertebral disc 07/10/2008  . Arthropathy, lower leg 05/23/2004   Past Medical History:  Past Medical History:  Diagnosis Date  . Arthritis    "joints" (10/15/2017)  . Benign prostatic hyperplasia   . Complication of anesthesia    "last OR in 06/2017 affected him cognitively; hasn't got back to baseline yet" (10/15/2017)  . Dementia   . Depression   . GERD (gastroesophageal reflux disease)   . High cholesterol   . Hypertension   . Hypothyroidism   . Renal disease   . Renal disorder   . TIA (transient ischemic attack) early 2000s   Past Surgical History:  Past Surgical History:  Procedure Laterality Date  . ABDOMINAL EXPLORATION SURGERY  7/  . APPENDECTOMY    . CATARACT EXTRACTION W/ INTRAOCULAR LENS  IMPLANT, BILATERAL Bilateral   . COLECTOMY  1950s   "thought he had iteilis"  . ESOPHAGOGASTRODUODENOSCOPY N/A 10/22/2017   Procedure: ESOPHAGOGASTRODUODENOSCOPY (EGD);  Surgeon: Gatha Mayer, MD;  Location: [facility] ENDOSCOPY;  Service: Endoscopy;  Laterality: N/A;  . ESOPHAGOGASTRODUODENOSCOPY (EGD) WITH PROPOFOL N/A 10/16/2017   Procedure:  ESOPHAGOGASTRODUODENOSCOPY (EGD) WITH PROPOFOL;  Surgeon: Milus Banister, MD;  Location: [facility] ENDOSCOPY;  Service: Endoscopy;  Laterality: N/A;  . FEMUR FRACTURE SURGERY Right 1950s  . FRACTURE SURGERY    . KNEE ARTHROSCOPY Left   . POSTERIOR LUMBAR FUSION    . ROTATOR CUFF REPAIR Left   . TONSILLECTOMY     HPI:  Anthony Hayes is a 83 y.o. male with medical history significant of BPH, dementia, previous CVA, history of multiple colon resections due to Crohn's disease as a child with poor absorption of nutrients and osteoarthritis who is a resident at [facility] brought in by daughter after she got a call that he was acting differently. She was brought to the emergency room where head CT was negative but MRI showed an infarct of R basal ganglia.   Assessment / Plan / Recommendation Clinical Impression  Patient presents with impairments in the areas of short term memory, complex problem solving/reasoning, and safety awareness which daugther notes are new despite dix of dementia (reported mild). Mild left sided facial assymetry noted however strength and ROM appear WFL and speech clear. Patient previously independent, living at ALF. Recommend CIR f/u to maximize independence prior to return. Will defer f/u to CIR as it appears based on notes that patient may discharge in teh next 24 hours or so.     SLP Assessment  SLP Recommendation/Assessment: All further Speech Lanaguage Pathology  needs can be addressed in the next venue of care SLP Visit Diagnosis: Cognitive communication deficit (R41.841)    Follow Up Recommendations  Inpatient Rehab  SLP Evaluation Cognition  Overall Cognitive Status: Impaired/Different from baseline Arousal/Alertness: Awake/alert Orientation Level: Oriented X4;Disoriented to situation Attention: Sustained Sustained Attention: Appears intact Memory: Impaired Memory Impairment: Retrieval deficit Awareness: Impaired Awareness  Impairment: Intellectual impairment;Anticipatory impairment Problem Solving: Impaired Problem Solving Impairment: Functional complex Safety/Judgment: Impaired Comments: per daughter, patient attempting to get up on own       Comprehension  Auditory Comprehension Overall Auditory Comprehension: Impaired Yes/No Questions: Within Functional Limits Commands: Impaired Complex Commands: 75-100% accurate Reading Comprehension Reading Status: Not tested    Expression Expression Primary Mode of Expression: Verbal Verbal Expression Overall Verbal Expression: Appears within functional limits for tasks assessed Written Expression Dominant Hand: Right   Oral / Motor  Motor Speech Overall Motor Speech: Appears within functional limits for tasks assessed                      Gabriel Rainwater MA, CCC-SLP [phone]  Anthony Hayes 04/04/2018, 3:48 PM

## 2018-04-04 NOTE — Evaluation (Signed)
Physical Therapy Evaluation Patient Details Name: Anthony Hayes MRN: [id number] DOB: 05/18/1935 Today's Date: 04/04/2018   History of Present Illness  Anthony Hayes is a 83 y.o. male with medical history significant of BPH, dementia, previous CVA, history of multiple colon resections due to Crohn's disease as a child with poor absorption of nutrients and osteoarthritis who is a resident at [facility] brought in by daughter after she got a call that he was acting differently. She was brought to the emergency room where head CT was negative but MRI showed an infarct of R basal ganglia.  Clinical Impression  Pt admitted with above. Pt was at ALF PTA and was using rollator for amb and was indep with dressing and bathing. Pt now with L UE weakness, impaired co-ordination and ataxia. Recommend CIR to maximize functional return for safe return to ALF apartment. More assist can be provided by facility if needed.    Follow Up Recommendations CIR;Supervision/Assistance - 24 hour    Equipment Recommendations  None recommended by PT    Recommendations for Other Services Rehab consult     Precautions / Restrictions Precautions Precautions: Fall Precaution Comments: L sided weakness Restrictions Weight Bearing Restrictions: No      Mobility  Bed Mobility Overal bed mobility: Needs Assistance Bed Mobility: Supine to Sit     Supine to sit: Min assist     General bed mobility comments: pt with decreased frunction of L UE requiring minA for trunk elevation and to scoot to EOB to get feet flat on floor  Transfers Overall transfer level: Needs assistance Equipment used: Rolling walker (2 wheeled) Transfers: Sit to/from Stand Sit to Stand: Min assist         General transfer comment: v/cs to push up from chair/bed, minA to power up and steady during transition of hands from bed to RW  Ambulation/Gait Ambulation/Gait assistance: Min assist Ambulation Distance  (Feet): 120 Feet Assistive device: 4-wheeled walker Gait Pattern/deviations: Step-through pattern;Decreased stride length Gait velocity: slow Gait velocity interpretation: Below normal speed for age/gender General Gait Details: pt required min/modA to maintain L hand on RW due to impaired grip strength. Pt able to clear both LEs but noted deceased step length  Stairs            Wheelchair Mobility    Modified Rankin (Stroke Patients Only) Modified Rankin (Stroke Patients Only) Pre-Morbid Rankin Score: Moderate disability Modified Rankin: Moderately severe disability     Balance Overall balance assessment: Needs assistance Sitting-balance support: Feet supported;No upper extremity supported Sitting balance-[name] Scale: Poor Sitting balance - Comments: pt with L lateral lean   Standing balance support: Bilateral upper extremity supported Standing balance-[name] Scale: Poor Standing balance comment: dependent on RW                             Pertinent Vitals/Pain Pain Assessment: No/denies pain    Home Living Family/patient expects to be discharged to:: Inpatient rehab                 Additional Comments: pt resident at [facility]. Pt reports he was doing own dressing/bathing, feeding, and walking to dinning all.    Prior Function Level of Independence: Independent with assistive device(s)         Comments: uses rollator and completes own dressing and bathing     Hand Dominance   Dominant Hand: Right    Extremity/Trunk Assessment   Upper  Extremity Assessment Upper Extremity Assessment: LUE deficits/detail LUE Deficits / Details: grip 3-/5, elbow 3/5, shld 3-/5    Lower Extremity Assessment Lower Extremity Assessment: Generalized weakness    Cervical / Trunk Assessment Cervical / Trunk Assessment: Kyphotic  Communication   Communication: Expressive difficulties  Cognition Arousal/Alertness: Awake/alert Behavior During  Therapy: Flat affect Overall Cognitive Status: Impaired/Different from baseline Area of Impairment: Orientation;Problem solving                 Orientation Level: Disoriented to;Situation           Problem Solving: Slow processing;Decreased initiation;Difficulty sequencing;Requires verbal cues;Requires tactile cues General Comments: pt with stool incontinence but aware he needed ot use bathroom      General Comments General comments (skin integrity, edema, etc.): pt with request to use bathroom. pt with stool incontinence. Pt minA to std pvt to [facility].     Exercises     Assessment/Plan    PT Assessment Patient needs continued PT services  PT Problem List Decreased strength;Decreased range of motion;Decreased activity tolerance;Decreased mobility;Decreased balance;Decreased coordination;Decreased cognition;Decreased knowledge of use of DME;Decreased safety awareness       PT Treatment Interventions DME instruction;Gait training;Functional mobility training;Therapeutic activities;Therapeutic exercise;Balance training;Neuromuscular re-education;Cognitive remediation    PT Goals (Current goals can be found in the Care Plan section)  Acute Rehab PT Goals Patient Stated Goal: get better PT Goal Formulation: With patient Time For Goal Achievement: 04/18/18 Potential to Achieve Goals: Good    Frequency Min 4X/week   Barriers to discharge Decreased caregiver support in ALF    Co-evaluation               AM-PAC PT "6 Clicks" Daily Activity  Outcome Measure Difficulty turning over in bed (including adjusting bedclothes, sheets and blankets)?: Unable Difficulty moving from lying on back to sitting on the side of the bed? : Unable Difficulty sitting down on and standing up from a chair with arms (e.g., wheelchair, bedside commode, etc,.)?: Unable Help needed moving to and from a bed to chair (including a wheelchair)?: A Little Help needed walking in hospital room?: A  Little Help needed climbing 3-5 steps with a railing? : A Little 6 Click Score: 12    End of Session Equipment Utilized During Treatment: Gait belt Activity Tolerance: Patient tolerated treatment well Patient left: (on [facility] with  nurse tech) Nurse Communication: Mobility status PT Visit Diagnosis: Unsteadiness on feet (R26.81);Difficulty in walking, not elsewhere classified (R26.2)    Time: 5537-4827 PT Time Calculation (min) (ACUTE ONLY): 34 min   Charges:   PT Evaluation $PT Eval Moderate Complexity: 1 Mod PT Treatments $Gait Training: 8-22 mins   PT G Codes:        Kittie Plater, PT, DPT Pager #: [phone] Office #: [phone]   [place] 04/04/2018, 10:16 AM

## 2018-04-05 ENCOUNTER — Inpatient Hospital Stay (HOSPITAL_COMMUNITY): Payer: Medicare Other

## 2018-04-05 DIAGNOSIS — I639 Cerebral infarction, unspecified: Secondary | ICD-10-CM | POA: Diagnosis present

## 2018-04-05 DIAGNOSIS — R0602 Shortness of breath: Secondary | ICD-10-CM

## 2018-04-05 DIAGNOSIS — G9340 Encephalopathy, unspecified: Secondary | ICD-10-CM

## 2018-04-05 DIAGNOSIS — D72829 Elevated white blood cell count, unspecified: Secondary | ICD-10-CM

## 2018-04-05 LAB — URINALYSIS, COMPLETE (UACMP) WITH MICROSCOPIC
BILIRUBIN URINE: NEGATIVE
Bilirubin Urine: NEGATIVE
Glucose, UA: NEGATIVE mg/dL
Glucose, UA: NEGATIVE mg/dL
Ketones, ur: NEGATIVE mg/dL
Ketones, ur: NEGATIVE mg/dL
Nitrite: POSITIVE — AB
Nitrite: NEGATIVE
PH: 5 (ref 5.0–8.0)
Protein, ur: 30 mg/dL — AB
Protein, ur: NEGATIVE mg/dL
SPECIFIC GRAVITY, URINE: 1.005 (ref 1.005–1.030)
Specific Gravity, Urine: 1.017 (ref 1.005–1.030)
pH: 5 (ref 5.0–8.0)

## 2018-04-05 LAB — CBC
HEMATOCRIT: 41 % (ref 39.0–52.0)
Hemoglobin: 13.8 g/dL (ref 13.0–17.0)
MCH: 29.4 pg (ref 26.0–34.0)
MCHC: 33.7 g/dL (ref 30.0–36.0)
MCV: 87.4 fL (ref 78.0–100.0)
Platelets: 323 10*3/uL (ref 150–400)
RBC: 4.69 MIL/uL (ref 4.22–5.81)
RDW: 14.8 % (ref 11.5–15.5)
WBC: 20.6 10*3/uL — AB (ref 4.0–10.5)

## 2018-04-05 LAB — BASIC METABOLIC PANEL
ANION GAP: 14 (ref 5–15)
BUN: 17 mg/dL (ref 6–20)
CALCIUM: 9.3 mg/dL (ref 8.9–10.3)
CO2: 22 mmol/L (ref 22–32)
Chloride: 97 mmol/L — ABNORMAL LOW (ref 101–111)
Creatinine, Ser: 1.33 mg/dL — ABNORMAL HIGH (ref 0.61–1.24)
GFR calc Af Amer: 57 mL/min — ABNORMAL LOW (ref >60)
GFR, EST NON AFRICAN AMERICAN: 49 mL/min — AB (ref >60)
GLUCOSE: 105 mg/dL — AB (ref 65–99)
POTASSIUM: 3.8 mmol/L (ref 3.5–5.1)
Sodium: 133 mmol/L — ABNORMAL LOW (ref 135–145)

## 2018-04-05 MED ORDER — PRAVASTATIN SODIUM 10 MG PO TABS: 10.0000 mg | ORAL_TABLET | Freq: Every day | ORAL | 0 refills | Status: DC

## 2018-04-05 MED ORDER — PANTOPRAZOLE SODIUM 40 MG PO TBEC: 40.0000 mg | DELAYED_RELEASE_TABLET | Freq: Every day | ORAL | 0 refills | Status: DC

## 2018-04-05 MED ORDER — B-12 1000 MCG PO TABS: 1000.0000 ug | ORAL_TABLET | Freq: Every day | ORAL | 0 refills | Status: DC

## 2018-04-05 MED ORDER — LEVOTHYROXINE SODIUM 50 MCG PO TABS: 50.0000 ug | ORAL_TABLET | ORAL | 0 refills | Status: DC

## 2018-04-05 MED ORDER — CLOPIDOGREL BISULFATE 75 MG PO TABS: 75.0000 mg | ORAL_TABLET | Freq: Every day | ORAL | 0 refills | Status: DC

## 2018-04-05 MED ORDER — SODIUM CHLORIDE 0.9 % IV SOLN
INTRAVENOUS | Status: DC
  Administered 2018-04-05: 17:00:00 via INTRAVENOUS
  Administered 2018-04-06: 50 mL via INTRAVENOUS

## 2018-04-05 MED ORDER — SENNOSIDES-DOCUSATE SODIUM 8.6-50 MG PO TABS: 1.0000 | ORAL_TABLET | Freq: Every evening | ORAL | 0 refills | Status: DC | PRN

## 2018-04-05 MED ORDER — SODIUM CHLORIDE 0.9 % IV SOLN
1.0000 g | INTRAVENOUS | Status: DC
  Administered 2018-04-05: 1 g via INTRAVENOUS
  Filled 2018-04-05 (×2): qty 10

## 2018-04-05 NOTE — Progress Notes (Signed)
Physical Therapy Treatment Patient Details Name: Anthony Hayes MRN: [id number] DOB: November 23, 1935 Today's Date: 04/05/2018    History of Present Illness Anthony Hayes is a 83 y.o. male with medical history significant of BPH, dementia, previous CVA, history of multiple colon resections due to Crohn's disease as a child with poor absorption of nutrients and osteoarthritis who is a resident at [facility] brought in by daughter after she got a call that he was acting differently. She was brought to the emergency room where head CT was negative but MRI showed an infarct of R basal ganglia.    PT Comments    Patient with change in mental/physical status today. Neuro MD in room and ordering stat Head CT and EEG. Pt much more lethargic today with difficulty maintaining arousal and focused attention. Also with marked weakness in BLEs and difficulty with motor planning, sequencing LEs to transfer back to bed requiring Max A. Yesterday, pt ambulatory with Min guard-Min A with rollator. Transfer to CIR being held until further work up is done. Will continue to follow.   Follow Up Recommendations  CIR;Supervision/Assistance - 24 hour     Equipment Recommendations  None recommended by PT    Recommendations for Other Services       Precautions / Restrictions Precautions Precautions: Fall Precaution Comments: L sided weakness Restrictions Weight Bearing Restrictions: No    Mobility  Bed Mobility Overal bed mobility: Needs Assistance Bed Mobility: Sit to Supine       Sit to supine: +2 for physical assistance;Max assist   General bed mobility comments: Assist to lower onto right side and bring LEs into bed despite max cues.  Transfers Overall transfer level: Needs assistance Equipment used: None Transfers: Sit to/from [facility] Sit to Stand: Mod assist Stand pivot transfers: Max assist       General transfer comment: Assist to power to standing  with help placing LUE on arm rest. Bil knee flexion noted with right knee buckling during attempted transfer. Not able to sequence LEs to take steps towards bed even with max cuing and weight shifting. Turned into a SPT towards bed.   Ambulation/Gait             General Gait Details: Deferred due to lethargy and change in status.   Stairs            Wheelchair Mobility    Modified Rankin (Stroke Patients Only) Modified Rankin (Stroke Patients Only) Pre-Morbid Rankin Score: Moderate disability Modified Rankin: Moderately severe disability     Balance Overall balance assessment: Needs assistance Sitting-balance support: Feet supported;No upper extremity supported Sitting balance-[name] Scale: Fair     Standing balance support: During functional activity Standing balance-[name] Scale: Zero Standing balance comment: Not able to stand without external support with bil knee flexion.                             Cognition Arousal/Alertness: Lethargic Behavior During Therapy: Flat affect Overall Cognitive Status: Impaired/Different from baseline Area of Impairment: Orientation;Attention;Following commands;Problem solving                 Orientation Level: Disoriented to;Time;Situation Current Attention Level: Focused   Following Commands: Follows one step commands inconsistently;Follows one step commands with increased time     Problem Solving: Slow processing;Decreased initiation;Difficulty sequencing;Requires verbal cues;Requires tactile cues General Comments: When asked his name, "i don't know," but able to state with continuous cues. Able  to state daughter's name in the room, "Anthony Hayes" with repetition. Difficult to maintain arousal. Able to state yes to hospital and that he is at [facility].       Exercises      General Comments General comments (skin integrity, edema, etc.): Daughter present during session.       Pertinent Vitals/Pain Pain  Assessment: No/denies pain    Home Living                      Prior Function            PT Goals (current goals can now be found in the care plan section) Progress towards PT goals: Not progressing toward goals - comment(secondary to change in mental/physical status.)    Frequency    Min 4X/week      PT Plan Current plan remains appropriate    Co-evaluation              AM-PAC PT "6 Clicks" Daily Activity  Outcome Measure  Difficulty turning over in bed (including adjusting bedclothes, sheets and blankets)?: Unable Difficulty moving from lying on back to sitting on the side of the bed? : Unable Difficulty sitting down on and standing up from a chair with arms (e.g., wheelchair, bedside commode, etc,.)?: Unable Help needed moving to and from a bed to chair (including a wheelchair)?: A Lot Help needed walking in hospital room?: Total Help needed climbing 3-5 steps with a railing? : Total 6 Click Score: 7    End of Session Equipment Utilized During Treatment: Gait belt Activity Tolerance: Patient limited by lethargy Patient left: in bed;with call bell/phone within reach;with bed alarm set;with family/visitor present Nurse Communication: Mobility status PT Visit Diagnosis: Unsteadiness on feet (R26.81);Difficulty in walking, not elsewhere classified (R26.2)     Time: 4174-0814 PT Time Calculation (min) (ACUTE ONLY): 17 min  Charges:  $Therapeutic Activity: 8-22 mins                    G Codes:       Anthony Hayes, PT, DPT [phone]     Anthony Hayes 04/05/2018, 1:41 PM

## 2018-04-05 NOTE — Procedures (Signed)
ELECTROENCEPHALOGRAM REPORT  Date of Study: 04/05/2018  Patient's Name: Anthony Hayes MRN: [id number] Date of Birth: 01-Oct-1935  Referring Provider: Dr. Rosalin Hawking  Clinical History: This is an 83 year old man with altered mental status.  Medications: No AEDs or sedating medications listed  Technical Summary: A multichannel digital EEG recording measured by the international 10-20 system with electrodes applied with paste and impedances below 5000 ohms performed as portable with EKG monitoring in an awake and asleep patient.  Hyperventilation and photic stimulation were not performed.  The digital EEG was referentially recorded, reformatted, and digitally filtered in a variety of bipolar and referential montages for optimal display.   Description: The patient is awake and asleep during the recording.  During maximal wakefulness, there is a symmetric, medium voltage 7 Hz posterior dominant rhythm that attenuates with eye opening. This is admixed with a small amount of diffuse 4-5 Hz theta slowing of the waking background.  During drowsiness and sleep, there is an increase in theta and delta slowing of the background with poorly formed vertex waves and sleep spindles were seen.  Hyperventilation and photic stimulation were not performed. There were no epileptiform discharges or electrographic seizures seen.    EKG lead was unremarkable.  Impression: This awake and asleep EEG is abnormal due to mild to moderate diffuse background slowing.  Clinical Correlation of the above findings indicates diffuse cerebral dysfunction that is non-specific in etiology and can be seen with hypoxic/ischemic injury, toxic/metabolic encephalopathies, neurodegenerative disorders, or medication effect.  The absence of epileptiform discharges does not rule out a clinical diagnosis of epilepsy.  Clinical correlation is advised.   Ellouise Newer, M.D.

## 2018-04-05 NOTE — Progress Notes (Signed)
EEG complete - results pending 

## 2018-04-05 NOTE — Discharge Instructions (Signed)

## 2018-04-05 NOTE — Discharge Summary (Addendum)
Physician Discharge Summary  Theopolis Sloop JJH:[id number] DOB: May 20, 1935 DOA: 04/03/2018  PCP: System, Pcp Not In  Admit date: 04/03/2018 Discharge date: 04/06/2018  Admitted From: Home Disposition: CIR  Recommendations for Outpatient Follow-up:  1. Follow up with MD at CIR in 1 week.  Please provide frequent incentive spirometry. Please follow urine culture 2. Follow-up with neurology Dr.Xu in 2 months. 3. Patient will be on DA PT with aspirin and Plavix for 6 weeks followed by Plavix alone indefinitely thereafter.   Equipment/Devices: As per therapy at the inpatient rehab.  Discharge Condition: Fair CODE STATUS: Full code Diet recommendation: Heart Healthy   Discharge Diagnoses:  Principal Problem:   Acute ischemic stroke ([place])   Active Problems: Acute metabolic encephalopathy UTI Hypokalemia   GERD (gastroesophageal reflux disease)   Essential hypertension, benign   Chronic kidney disease, stage III (moderate) (HCC)   Dyslipidemia   Hypothyroidism (acquired)   CVA (cerebral vascular accident) ([address])  Brief narrative/HPI Please refer to admission H&P for details, in brief,83 y/o male with prior CVA, BPH, Mallory-Weiss and erosive esophagitis, multiple colonic resection for crohn's ds, mild dementia, resident of assisted living brought to ED eiyth acute change in menta status and dizzy. MRI brain showed small rt posterior basal basal ganglia stroke.  Hospital course  Principal Problem: Acute ischemic stroke [facility]) MRI brain shows small areas of acute infarction affecting the posterior right basal ganglia and radiating white matter tracts on the right. Extensive chronic small vessel ischemic changes.  2D echo with normal EF, normal wall motion abnormality or PFO.  Carotid Doppler with nonsignificant stenosis. Patient was on baby aspirin at home. Stroke team recommends this is likely small vessel disease, transcranial Doppler with diffuse intracranial atherosclerotic disease,  no  major vessel r occluded. Recommend 3 weeks fo DAPT followed by single agent (  plavix) thereafter.  Started on pravastatin 10 mg daily.  LDL of 70 with elevated triglycerides.  A1c of 5.4.   PT recommends CIR, stable for discharge today.   Active Problems: Acute metabolic encephalopathy on 4/9. UTI Patient found to be lethargic and poorly participating in conversation.  Also concerning for worsened left-sided weakness.  Discharge to CIR canceled.  Head CT unremarkable for worsened ischemia or new bleed.  EEG negative for seizures.  Blood work repeated shows significant leukocytosis (20 K) .UA was positive for UTI and started on empiric Rocephin.  Urine culture sent which should be followed at the rehab and antibiotic course determined accordingly.  WBC improved this morning.  vitamin b12 deficiency Has some peripheral neuropathy. Low B12 ( per daughter was getting monthly b12 shots while he lived independently 1 year back). Given a dose of sq B12, will need monthly injections as outpt. ( cannot absorb po due to colonic resection).  Next dose will be on 5/7.  Chronic kidney disease stage III Stable at baseline.   essential hypertension  stable. allow permissive BP for next 3-4 days.Marland Kitchen  History of upper GI bleed in October 2018 with Mallory-Weiss tear and ulcerative esophagitis Reduce PPI to once a day (was supposed to be on twice daily only until November 2018).  Atelectasis of lung Chest x-ray without infiltrate, shows atelectasis.  Incentive spirometry.  Hypothyroidism TSH mildly elevated.  Increased Synthroid dose.  Hypokalemia Replenished   Patient more awake today.  Not participating well on exam but does not appear to have worsened symptoms.  Will need active inpatient rehab.  Stable to be discharged today.  Family Communication  :  Daughter updated on the phone  Disposition Plan  : CIR   Consults  :  stroke  Procedures  : CT head, MRI brain, US carotid, echo,  TCD, EEG        Discharge Instructions   Allergies as of 04/05/2018      Reactions   Lisinopril Swelling   Swelling of lips      Medication List    STOP taking these medications   cephALEXin 500 MG capsule Commonly known as:  KEFLEX  B-12 1000 MCG Tabs      TAKE these medications   aspirin EC 81 MG tablet Take 81 mg by mouth daily.      cetirizine 10 MG tablet Commonly known as:  ZYRTEC Take 10 mg by mouth daily.   clopidogrel 75 MG tablet Commonly known as:  PLAVIX Take 1 tablet (75 mg total) by mouth daily. Start taking on:  04/06/2018 Ceftriaxone injection 1 gm Inject 1 g IV every 24 hours (starting date 4/9)  hydrochlorothiazide 25 MG tablet Commonly known as:  HYDRODIURIL Take 25 mg by mouth daily.   imipramine 25 MG tablet Commonly known as:  TOFRANIL Take 25 mg by mouth at bedtime.   ipratropium 0.06 % nasal spray Commonly known as:  ATROVENT Place 2 sprays into both nostrils 4 (four) times daily.   levothyroxine 50 MCG tablet Commonly known as:  SYNTHROID, LEVOTHROID Take 1 tablet (50 mcg total) by mouth every morning. What changed:  how much to take   oxybutynin 5 MG tablet Commonly known as:  DITROPAN Take 5 mg by mouth every morning.   pantoprazole 40 MG tablet Commonly known as:  PROTONIX Take 1 tablet (40 mg total) by mouth daily. What changed:  when to take this   pravastatin 10 MG tablet Commonly known as:  PRAVACHOL Take 1 tablet (10 mg total) by mouth daily at 6 PM.   promethazine-dextromethorphan 6.25-15 MG/5ML syrup Commonly known as:  PROMETHAZINE-DM Take 10 mLs by mouth 4 (four) times daily as needed for cough.   senna-docusate 8.6-50 MG tablet Commonly known as:  Senokot-S Take 1 tablet by mouth at bedtime as needed for moderate constipation.   tamsulosin 0.4 MG Caps capsule Commonly known as:  FLOMAX Take 1 capsule (0.4 mg total) by mouth daily.      Follow-up Information    Rosalin Hawking, MD. Schedule an  appointment as soon as possible for a visit in 2 month(s).   Specialty:  Neurology Contact information: [address] [place] [address] [phone]          Allergies  Allergen Reactions  . Lisinopril Swelling    Swelling of lips       Procedures/Studies: Dg Chest 2 View  Result Date: 04/03/2018 CLINICAL DATA:  Bilateral arm weakness. EXAM: CHEST - 2 VIEW COMPARISON:  February 02, 2018 FINDINGS: The heart, hila, and mediastinum are normal. No pulmonary nodules or masses. No focal infiltrates. IMPRESSION: No active cardiopulmonary disease. Electronically Signed   By: Dorise Bullion III M.D   On: 04/03/2018 12:53   Ct Head Wo Contrast  Result Date: 04/03/2018 CLINICAL DATA:  Pt has weakness in all extremities and problem getting his spoon or fork to his mouth EXAM: CT HEAD WITHOUT CONTRAST TECHNIQUE: Contiguous axial images were obtained from the base of the skull through the vertex without intravenous contrast. COMPARISON:  10/18/2017 FINDINGS: Brain: No evidence of acute infarction, hemorrhage, hydrocephalus, extra-axial collection or mass lesion/mass effect. There is ventricular sulcal  enlargement reflecting mild diffuse atrophy. Patchy white matter hypoattenuation is also noted consistent with moderate chronic microvascular ischemic change. Vascular: No hyperdense vessel or unexpected calcification. Skull: Normal. Negative for fracture or focal lesion. Sinuses/Orbits: Globes and orbits are unremarkable. Visualized sinuses and mastoid air cells are clear. Other: None. IMPRESSION: 1. No acute intracranial abnormalities. 2. Atrophy and chronic microvascular ischemic change. Stable appearance from the prior study. Electronically Signed   By: Lajean Manes M.D.   On: 04/03/2018 12:37   Mr Brain Wo Contrast (neuro Protocol)  Result Date: 04/03/2018 CLINICAL DATA:  Weakness in the arms and legs today. EXAM: MRI HEAD WITHOUT CONTRAST TECHNIQUE: Multiplanar, multiecho pulse  sequences of the brain and surrounding structures were obtained without intravenous contrast. COMPARISON:  CT 04/03/2018 FINDINGS: Brain: Diffusion imaging shows areas of acute infarction on the right affecting the posterior basal ganglia and radiating white matter tracts. No cortical or large vessel territory infarction. There chronic small-vessel ischemic changes throughout the pons. No focal cerebellar insult. There is an old lacunar infarction in the left thalamus. Cerebral hemispheres elsewhere show chronic small-vessel ischemic changes affecting the basal ganglia and throughout the cerebral hemispheric white matter. Few punctate foci of hemosiderin deposition associated with some of the old strokes. No sign of mass lesion, acute hemorrhage, hydrocephalus or extra-axial collection. Vascular: Major vessels at the base of the brain show flow. Skull and upper cervical spine: Negative Sinuses/Orbits: Clear/normal Other: None IMPRESSION: Small areas of acute infarction affecting the posterior right basal ganglia and radiating white matter tracts on the right. No swelling or hemorrhage. Extensive chronic small-vessel ischemic changes elsewhere throughout the brain as outlined above. Electronically Signed   By: Nelson Chimes M.D.   On: 04/03/2018 20:57   Dg Chest Port 1 View  Result Date: 04/05/2018 CLINICAL DATA:  Seven cough.  Shortness of breath. EXAM: PORTABLE CHEST 1 VIEW COMPARISON:  02/02/2018. FINDINGS: Mediastinum and hilar structures normal. Mild cardiomegaly with normal pulmonary vascularity. Mild bibasilar subsegmental atelectasis and/or scarring. No pleural effusion or pneumothorax. Degenerative changes scoliosis thoracic spine. IMPRESSION: Mild bibasilar subsegmental atelectasis and/or scarring. Electronically Signed   By: Marcello Moores  Register   On: 04/05/2018 10:34    2D echo Study Conclusions  - Left ventricle: The cavity size was normal. There was mild   concentric hypertrophy. Systolic function  was normal. The   estimated ejection fraction was in the range of 55% to 60%. Wall   motion was normal; there were no regional wall motion   abnormalities. Doppler parameters are consistent with abnormal   left ventricular relaxation (grade 1 diastolic dysfunction). - Aortic valve: There was trivial regurgitation. Valve area (VTI):   2.5 cm^2. Valve area (Vmax): 2.49 cm^2. Valve area (Vmean): 2.42   cm^2. - Atrial septum: No defect or patent foramen ovale was identified.  Subjective: Some improvement in mental status from yesterday.  Participating in conversation more.  Discharge Exam: Vitals:   04/05/18 0804 04/05/18 1127  BP: 131/68 131/70  Pulse: 61 86  Resp: 20 16  Temp: 99.6 F (37.6 C) 99.4 F (37.4 C)  SpO2: 95% 96%   Vitals:   04/05/18 0000 04/05/18 0400 04/05/18 0804 04/05/18 1127  BP: (!) 150/81 137/79 131/68 131/70  Pulse: 69 83 61 86  Resp: 18 18 20 16   Temp: 99.1 F (37.3 C) 99.3 F (37.4 C) 99.6 F (37.6 C) 99.4 F (37.4 C)  TempSrc: Oral Oral Oral Oral  SpO2: 98% 98% 95% 96%  Weight:  Height:        General: Elderly male not in distress HEENT: Moist mucosa, supple neck Chest: Diminished bibasilar breath sound CVS: Normal S1 and S2, no murmurs GI: Soft, nondistended, nontender Musculoskeletal: Warm, no edema CNS: Alert and oriented, 4+/5 power in left upper and lower extremity.  (Unchanged)    The results of significant diagnostics from this hospitalization (including imaging, microbiology, ancillary and laboratory) are listed below for reference.     Microbiology: Recent Results (from the past 240 hour(s))  Urine culture     Status: None   Collection Time: 04/03/18  1:57 PM  Result Value Ref Range Status   Specimen Description URINE, CATHETERIZED  Final   Special Requests NONE  Final   Culture   Final    NO GROWTH Performed at [facility], [address], [place], [place] [address]    Report Status 04/04/2018 FINAL  Final   MRSA PCR Screening     Status: None   Collection Time: 04/04/18  3:13 AM  Result Value Ref Range Status   MRSA by PCR NEGATIVE NEGATIVE Final    Comment:        The GeneXpert MRSA Assay (FDA approved for NASAL specimens only), is one component of a comprehensive MRSA colonization surveillance program. It is not intended to diagnose MRSA infection nor to guide or monitor treatment for MRSA infections. Performed at [facility], [place] [address] [place], [place] [address]      Labs: BNP (last 3 results) No results for input(s): BNP in the last 8760 hours. Basic Metabolic Panel: Recent Labs  Lab 04/03/18 1019 04/03/18 1028 04/03/18 2337 04/04/18 0608  NA 136 139  --  137  K 4.0 4.1  --  3.8  CL 102 102  --  105  CO2 24  --   --  21*  GLUCOSE 87 87  --  109*  BUN 21* 22*  --  21*  CREATININE 1.22 1.30* 1.24 1.09  CALCIUM 9.4  --   --  8.7*   Liver Function Tests: Recent Labs  Lab 04/03/18 1019 04/04/18 0608  AST 28 24  ALT 17 17  ALKPHOS 129* 110  BILITOT 0.5 0.6  PROT 6.9 6.3*  ALBUMIN 3.8 3.3*   No results for input(s): LIPASE, AMYLASE in the last 168 hours. No results for input(s): AMMONIA in the last 168 hours. CBC: Recent Labs  Lab 04/03/18 1019 04/03/18 1028 04/03/18 2337 04/04/18 0608  WBC 7.7  --  7.6 7.3  NEUTROABS 4.8  --   --   --   HGB 13.4 13.9 12.4* 12.4*  HCT 40.1 41.0 37.0* 37.2*  MCV 87.6  --  87.7 86.9  PLT 354  --  329 332   Cardiac Enzymes: Recent Labs  Lab 04/03/18 2337  TROPONINI <0.03   BNP: Invalid input(s): POCBNP CBG: No results for input(s): GLUCAP in the last 168 hours. D-Dimer No results for input(s): DDIMER in the last 72 hours. Hgb A1c Recent Labs    04/04/18 0608  HGBA1C 5.4   Lipid Profile Recent Labs    04/04/18 0608  CHOL 155  HDL 30*  LDLCALC 70  TRIG 276*  CHOLHDL 5.2   Thyroid function studies Recent Labs    04/04/18 0608  TSH 4.601*   Anemia work up Recent Labs     04/03/18 2322  VITAMINB12 295   Urinalysis    Component Value Date/Time   COLORURINE YELLOW 04/03/2018 1457  APPEARANCEUR CLEAR 04/03/2018 1457   LABSPEC 1.010 04/03/2018 1457   PHURINE 5.0 04/03/2018 1457   GLUCOSEU NEGATIVE 04/03/2018 1457   HGBUR NEGATIVE 04/03/2018 [place] 04/03/2018 [place] 01/21/2018 2022   KETONESUR NEGATIVE 04/03/2018 1457   PROTEINUR NEGATIVE 04/03/2018 1457   UROBILINOGEN 1.0 01/21/2018 2022   NITRITE NEGATIVE 04/03/2018 1457   LEUKOCYTESUR NEGATIVE 04/03/2018 1457   Sepsis Labs Invalid input(s): PROCALCITONIN,  WBC,  LACTICIDVEN Microbiology Recent Results (from the past 240 hour(s))  Urine culture     Status: None   Collection Time: 04/03/18  1:57 PM  Result Value Ref Range Status   Specimen Description URINE, CATHETERIZED  Final   Special Requests NONE  Final   Culture   Final    NO GROWTH Performed at [facility], [place] [address] [place], [place] [address]    Report Status 04/04/2018 FINAL  Final  MRSA PCR Screening     Status: None   Collection Time: 04/04/18  3:13 AM  Result Value Ref Range Status   MRSA by PCR NEGATIVE NEGATIVE Final    Comment:        The GeneXpert MRSA Assay (FDA approved for NASAL specimens only), is one component of a comprehensive MRSA colonization surveillance program. It is not intended to diagnose MRSA infection nor to guide or monitor treatment for MRSA infections. Performed at [facility], [place] [address], [place], [place] [address]      Time coordinating discharge: 36  minutes  SIGNED:   Louellen Molder, MD  [facility] 04/05/2018, 11:52 AM Pager   If 7PM-7AM, please contact night-coverage [email or web address] Password [email or web address]

## 2018-04-05 NOTE — Progress Notes (Signed)
ANTIBIOTIC CONSULT NOTE - INITIAL  Pharmacy Consult for rocephin Indication: UTI  Allergies  Allergen Reactions  . Lisinopril Swelling    Swelling of lips     Patient Measurements: Height: 5' 5"  (165.1 cm) Weight: 175 lb (79.4 kg) IBW/kg (Calculated) : 61.5   Vital Signs: Temp: 98.1 F (36.7 C) (04/09 1645) Temp Source: Oral (04/09 1645) BP: 124/66 (04/09 1645) Pulse Rate: 68 (04/09 1645) Intake/Output from previous day: 04/08 0701 - 04/09 0700 In: 720 [P.O.:720] Out: 400 [Urine:400] Intake/Output from this shift: Total I/O In: 360 [P.O.:360] Out: 350 [Urine:350]  Labs: Recent Labs    04/03/18 2337 04/04/18 0608 04/05/18 1352  WBC 7.6 7.3 20.6*  HGB 12.4* 12.4* 13.8  PLT 329 332 323  CREATININE 1.24 1.09 1.33*   Estimated Creatinine Clearance: 43 mL/min (A) (by C-G formula based on SCr of 1.33 mg/dL (H)). No results for input(s): VANCOTROUGH, VANCOPEAK, VANCORANDOM, GENTTROUGH, GENTPEAK, GENTRANDOM, TOBRATROUGH, TOBRAPEAK, TOBRARND, AMIKACINPEAK, AMIKACINTROU, AMIKACIN in the last 72 hours.   Microbiology: Recent Results (from the past 720 hour(s))  Urine culture     Status: None   Collection Time: 04/03/18  1:57 PM  Result Value Ref Range Status   Specimen Description URINE, CATHETERIZED  Final   Special Requests NONE  Final   Culture   Final    NO GROWTH Performed at [facility], [address], [place], [place] [address]    Report Status 04/04/2018 FINAL  Final  MRSA PCR Screening     Status: None   Collection Time: 04/04/18  3:13 AM  Result Value Ref Range Status   MRSA by PCR NEGATIVE NEGATIVE Final    Comment:        The GeneXpert MRSA Assay (FDA approved for NASAL specimens only), is one component of a comprehensive MRSA colonization surveillance program. It is not intended to diagnose MRSA infection nor to guide or monitor treatment for MRSA infections. Performed at [facility], [place] [address] [place],  [address]     Medical History: Past Medical History:  Diagnosis Date  . Arthritis    "joints" (10/15/2017)  . Benign prostatic hyperplasia   . Complication of anesthesia    "last OR in 06/2017 affected him cognitively; hasn't got back to baseline yet" (10/15/2017)  . Dementia   . Depression   . GERD (gastroesophageal reflux disease)   . High cholesterol   . Hypertension   . Hypothyroidism   . Renal disease   . Renal disorder   . TIA (transient ischemic attack) early 2000s    Medications:  Medications Prior to Admission  Medication Sig Dispense Refill Last Dose  . aspirin EC 81 MG tablet Take 81 mg by mouth daily.   04/03/2018 at Unknown time  . cetirizine (ZYRTEC) 10 MG tablet Take 10 mg by mouth daily.   04/02/2018 at Unknown time  . hydrochlorothiazide (HYDRODIURIL) 25 MG tablet Take 25 mg by mouth daily.   04/03/2018 at Unknown time  . imipramine (TOFRANIL) 25 MG tablet Take 25 mg by mouth at bedtime.   04/02/2018 at Unknown time  . ipratropium (ATROVENT) 0.06 % nasal spray Place 2 sprays into both nostrils 4 (four) times daily.   04/03/2018 at Unknown time  . oxybutynin (DITROPAN) 5 MG tablet Take 5 mg by mouth every morning.   04/03/2018 at Unknown time  . promethazine-dextromethorphan (PROMETHAZINE-DM) 6.25-15 MG/5ML syrup Take 10 mLs by mouth 4 (four) times daily as needed for cough.   prn at  prn  . tamsulosin (FLOMAX) 0.4 MG CAPS capsule Take 1 capsule (0.4 mg total) by mouth daily. 14 capsule 0 04/03/2018 at Unknown time  . [DISCONTINUED] levothyroxine (SYNTHROID, LEVOTHROID) 50 MCG tablet Take 25 mcg by mouth every morning.    04/03/2018 at Unknown time  . [DISCONTINUED] pantoprazole (PROTONIX) 40 MG tablet Take 1 tablet (40 mg total) by mouth 2 (two) times daily. 60 tablet 0 04/03/2018 at Unknown time  . cephALEXin (KEFLEX) 500 MG capsule Take 1 capsule (500 mg total) by mouth 4 (four) times daily. (Patient not taking: Reported on 04/03/2018) 28 capsule 0 Not Taking at Unknown time   Assessment: 83  yo man to start rocephin for UTI.    Goal of Therapy:  Eradication of infection  Plan:  Rocephin 1gm IV q24 hours Pharmacy will sign off as no dose adjustment needed  Anthony Hayes 04/05/2018,5:43 PM

## 2018-04-05 NOTE — Care Management Note (Signed)
Case Management Note  Patient Details  Name: Anthony Hayes MRN: [id number] Date of Birth: 1935/02/14  Subjective/Objective:                    Action/Plan: Plan is for [facility] when patient is medically ready. CM following.  Expected Discharge Date:  04/05/18               Expected Discharge Plan:     In-House Referral:     Discharge planning Services     Post Acute Care Choice:    Choice offered to:     DME Arranged:    DME Agency:     HH Arranged:    HH Agency:     Status of Service:     If discussed at [employer], dates discussed:    Additional Comments:  Pollie Friar, RN 04/05/2018, 5:03 PM

## 2018-04-05 NOTE — Evaluation (Signed)
Occupational Therapy Evaluation Patient Details Name: Anthony Hayes MRN: [id number] DOB: 10/13/1935 Today's Date: 04/05/2018    History of Present Illness Anthony Hayes is a 83 y.o. male with medical history significant of BPH, dementia, previous CVA, history of multiple colon resections due to Crohn's disease as a child with poor absorption of nutrients and osteoarthritis who is a resident at [facility] brought in by daughter after she got a call that he was acting differently. She was brought to the emergency room where head CT was negative but MRI showed an infarct of R basal ganglia.   Clinical Impression   PTA, pt was living at [facility] and was able to perform BADLs and functional mobility with rollator. Pt presenting with decreased strength and functional use of left (dominant) hand, sitting/standing balance, and cognition. Pt highly fatigued during session and requiring Mod A +2 for sit<>stand from recliner. Pt motivated to participate in therapy despite fatigue. Pt with good family support. Pt would benefit from further acute OT to facilitate safe dc. Recommend dc to CIR for intensive OT to optimize safety, independence with ADLs, and return to PLOF.      Follow Up Recommendations  CIR;Supervision/Assistance - 24 hour    Equipment Recommendations  Other (comment)(Defer to next venue)    Recommendations for Other Services Rehab consult;PT consult;Speech consult     Precautions / Restrictions Precautions Precautions: Fall Precaution Comments: L sided weakness and sensation defcitis Restrictions Weight Bearing Restrictions: No      Mobility Bed Mobility Overal bed mobility: Needs Assistance Bed Mobility: Sit to Supine     Supine to sit: Min assist Sit to supine: +2 for physical assistance;Max assist   General bed mobility comments: In recliner upon arrival  Transfers Overall transfer level: Needs assistance Equipment used:  None Transfers: Sit to/from [facility] Sit to Stand: Mod assist;+2 physical assistance Stand pivot transfers: Max assist       General transfer comment: Mod A for power up into standing with +2 for safety in standing    Balance Overall balance assessment: Needs assistance Sitting-balance support: Feet supported;No upper extremity supported Sitting balance-[name] Scale: Fair Sitting balance - Comments: pt with L lateral lean   Standing balance support: During functional activity Standing balance-[name] Scale: Poor Standing balance comment: Not able to stand without external support with bil knee flexion.                            ADL either performed or assessed with clinical judgement   ADL Overall ADL's : Needs assistance/impaired Eating/Feeding: Moderate assistance;Sitting   Grooming: Wash/dry face;Minimal assistance;Sitting Grooming Details (indicate cue type and reason): Pt washign his face with increased time while sitting in recliner. Pt using RUE only Upper Body Bathing: Moderate assistance;Sitting   Lower Body Bathing: Maximal assistance;Sit to/from stand   Upper Body Dressing : Moderate assistance;Sitting   Lower Body Dressing: Maximal assistance;Sit to/from stand Lower Body Dressing Details (indicate cue type and reason): Pt unable to lean forward to adjust socks Toilet Transfer: Moderate assistance;+2 for physical assistance Toilet Transfer Details (indicate cue type and reason): Pt performing sit<>stand only with Mod A +2 for stability and to power into standing         Functional mobility during ADLs: Moderate assistance;+2 for physical assistance(sit<>stand only) General ADL Comments: Pt lethargic and fatigued. Continue to be motivated to participate in therapy despite fatigue.      Vision  Baseline Vision/History: Wears glasses Wears Glasses: At all times Patient Visual Report: No change from baseline       Perception      Praxis      Pertinent Vitals/Pain Pain Assessment: No/denies pain     Hand Dominance Left   Extremity/Trunk Assessment Upper Extremity Assessment Upper Extremity Assessment: LUE deficits/detail LUE Deficits / Details: Weak grasp strength and unable to bring hand to mouth. Pt with decreased finger opposition. Able to touch thumb to 1-4 digits with increased time   Lower Extremity Assessment Lower Extremity Assessment: Defer to PT evaluation   Cervical / Trunk Assessment Cervical / Trunk Assessment: Kyphotic   Communication Communication Communication: Expressive difficulties   Cognition Arousal/Alertness: Lethargic Behavior During Therapy: Flat affect Overall Cognitive Status: Impaired/Different from baseline Area of Impairment: Orientation;Attention;Following commands;Problem solving                 Orientation Level: Disoriented to;Time;Situation Current Attention Level: Focused   Following Commands: Follows one step commands inconsistently;Follows one step commands with increased time     Problem Solving: Slow processing;Decreased initiation;Difficulty sequencing;Requires verbal cues;Requires tactile cues General Comments: Pt breathing heavy and closing eyes throughout session. Pt very fatigued but agreeable to participate in therapy.   General Comments  Daughter present during session.     Exercises     Shoulder Instructions      Home Living Family/patient expects to be discharged to:: Inpatient rehab     Type of Home: Assisted living                           Additional Comments: Resident at [facility]. All PLOF and home set up confirmed by daughter, who is a [employer] SLP. Pt       Prior Functioning/Environment Level of Independence: Independent with assistive device(s)        Comments: uses rollator and completes own dressing and bathing. Walks to meals        OT Problem List: Decreased strength;Decreased range of  motion;Decreased activity tolerance;Impaired balance (sitting and/or standing);Decreased cognition;Decreased safety awareness;Decreased knowledge of use of DME or AE;Impaired UE functional use      OT Treatment/Interventions: Self-care/ADL training;Therapeutic exercise;Energy conservation;DME and/or AE instruction;Therapeutic activities;Patient/family education    OT Goals(Current goals can be found in the care plan section) Acute Rehab OT Goals Patient Stated Goal: get better OT Goal Formulation: With patient/family Time For Goal Achievement: 04/19/18 Potential to Achieve Goals: Good ADL Goals Pt Will Perform Grooming: with min guard assist;standing Pt Will Perform Upper Body Dressing: with min guard assist;sitting Pt Will Transfer to Toilet: with min guard assist;ambulating;bedside commode Pt Will Perform Toileting - Clothing Manipulation and hygiene: with min guard assist;sit to/from stand Pt/caregiver will Perform Home Exercise Program: Increased ROM;Increased strength;Left upper extremity;With Supervision;With written HEP provided  OT Frequency: Min 2X/week   Barriers to D/C:            Co-evaluation              AM-PAC PT "6 Clicks" Daily Activity     Outcome Measure Help from another person eating meals?: A Lot Help from another person taking care of personal grooming?: A Lot Help from another person toileting, which includes using toliet, bedpan, or urinal?: A Lot Help from another person bathing (including washing, rinsing, drying)?: A Lot Help from another person to put on and taking off regular upper body clothing?: A Lot Help from another person to put  on and taking off regular lower body clothing?: A Lot 6 Click Score: 12   End of Session Equipment Utilized During Treatment: Gait belt Nurse Communication: Mobility status;Precautions  Activity Tolerance: Patient tolerated treatment well;Patient limited by fatigue;Patient limited by lethargy Patient left: in  chair;with call bell/phone within reach;with chair alarm set;with family/visitor present  OT Visit Diagnosis: Unsteadiness on feet (R26.81);Other abnormalities of gait and mobility (R26.89);Muscle weakness (generalized) (M62.81);Other symptoms and signs involving cognitive function;Hemiplegia and hemiparesis Hemiplegia - Right/Left: Left Hemiplegia - dominant/non-dominant: Dominant Hemiplegia - caused by: Cerebral infarction                Time: 1206-1232 OT Time Calculation (min): 26 min Charges:  OT General Charges $OT Visit: 1 Visit OT Evaluation $OT Eval Moderate Complexity: 1 Mod OT Treatments $Self Care/Home Management : 8-22 mins G-Codes:     [place], OTR/L Acute Rehab Pager: [phone] Office: [place] 04/05/2018, 2:42 PM

## 2018-04-05 NOTE — Progress Notes (Addendum)
STROKE TEAM PROGRESS NOTE   SUBJECTIVE (INTERVAL HISTORY) No family present. Patient sitting up in the bed, sleeping, food in front of him. Will awake to voice but lethargic. During exam, would be DOE. With effort, able to provide same level of participation, strength as yesterday. Patient unable to remember if he slept well or not. Dr. Clementeen Graham arrived during rounds. We discussed his perceived lack of strength and I shared concerned about DOE. sats 97% on RA. Increased movement and interaction as he became more awake. Will alert Dr. Erlinda Hong to follow up this afternoon.  OBJECTIVE Vitals:   04/05/18 0000 04/05/18 0400 04/05/18 0804 04/05/18 1127  BP: (!) 150/81 137/79 131/68 131/70  Pulse: 69 83 61 86  Resp: 18 18 20 16   Temp: 99.1 F (37.3 C) 99.3 F (37.4 C) 99.6 F (37.6 C) 99.4 F (37.4 C)  TempSrc: Oral Oral Oral Oral  SpO2: 98% 98% 95% 96%  Weight:      Height:        CBC:  Recent Labs  Lab 04/03/18 1019  04/03/18 2337 04/04/18 0608  WBC 7.7  --  7.6 7.3  NEUTROABS 4.8  --   --   --   HGB 13.4   < > 12.4* 12.4*  HCT 40.1   < > 37.0* 37.2*  MCV 87.6  --  87.7 86.9  PLT 354  --  329 332   < > = values in this interval not displayed.    Basic Metabolic Panel:  Recent Labs  Lab 04/03/18 1019 04/03/18 1028 04/03/18 2337 04/04/18 0608  NA 136 139  --  137  K 4.0 4.1  --  3.8  CL 102 102  --  105  CO2 24  --   --  21*  GLUCOSE 87 87  --  109*  BUN 21* 22*  --  21*  CREATININE 1.22 1.30* 1.24 1.09  CALCIUM 9.4  --   --  8.7*    Lipid Panel:     Component Value Date/Time   CHOL 155 04/04/2018 0608   TRIG 276 (H) 04/04/2018 0608   HDL 30 (L) 04/04/2018 0608   CHOLHDL 5.2 04/04/2018 0608   VLDL 55 (H) 04/04/2018 0608   LDLCALC 70 04/04/2018 0608   HgbA1c:  Lab Results  Component Value Date   HGBA1C 5.4 04/04/2018   Urine Drug Screen: No results found for: LABOPIA, COCAINSCRNUR, LABBENZ, AMPHETMU, THCU, LABBARB  Alcohol Level No results found for:  [facility]  IMAGING  Dg Chest 2 View 04/03/2018 No active cardiopulmonary disease.  04/05/2018 Mild bibasilar subsegmental atelectasis and/or scarring.  Ct Head Wo Contrast 04/03/2018 1. No acute intracranial abnormalities. 2. Atrophy and chronic microvascular ischemic change. Stable appearance from the prior study.  04/05/2018 pending   Mr Brain Wo Contrast (neuro Protocol) 04/03/2018 Small areas of acute infarction affecting the posterior right basal ganglia and radiating white matter tracts on the right. No swelling or hemorrhage. Extensive chronic small-vessel ischemic changes elsewhere throughout the brain as outlined above.   2D Echocardiogram  EF 55-60% with no source of embolus.   Carotid Doppler   There is 1-39% bilateral ICA stenosis. Vertebral artery flow is antegrade.    Transcranial Doppler Poor L temporal window.  Diffuse intracranial atherosclerosis.    PHYSICAL EXAM GENERAL: sleeping sitting up in bed. Awakes to voice but remains lethargic. With stimulation, more interactive. Alert and oriented to person and place.  HEENT: - Normocephalic and atraumatic, dry mm LUNGS - Clear to  auscultation bilaterally with no wheezes CV - S1S2 RRR, no m/r/g, equal pulses bilaterally. Ext: warm, well perfused, intact peripheral pulses, no edema NEURO:  Mental Status: Awake, alert, oriented x2.Cannot remember how he slept last night Was able to tell me the name of the hospital.  Dysarthria that improved with stimulation. Naming, repetition comprehension intact. Can follow commands. Cranial Nerves: PERRL EOMI, visual fields full, mild lower facial weakness on the left at rest which is overcome by smiling, facial sensation intact, hearing intact, tongue/uvula/soft palate midline, normal sternocleidomastoid and trapezius muscle strength. No evidence of tongue atrophy or fibrillations Motor: strength 5/5 on the R, LUE 4/5 and LLE 4/5, There is a drift in left upper extremity. Grip slightly less on  the L, same as yesterday. L leg drift when lifting both legs Tone: symmetric Sensation- decreased bilaterally but symmetric Coordination: FTN decreased LUE, ok RUE.  Gait- deferred.    ASSESSMENT/PLAN Mr. Corneilus Heggie is a 83 y.o. L handed white male with history of hypertension, hyperlipidemia, TIA, BPH, GERD, chronic kidney disease stage III, hypothyroidism, Crohn's disease status post colectomy as a teenager, suspected peripheral neuropathy, and former smoker presenting after waking with left hemiparesis and difficulty walking.   Stroke:  right basal ganglia white matter infarct secondary to small vessel disease source  Resultant  Left hemiparesis  CT head Small vessel disease. Atrophy.   MRI head right basal ganglia white matter infarct. Extensive small vessel disease  TCD pending   Carotid Doppler  B ICA 1-39% stenosis, VAs antegrade   2D Echo  EF 55-60%. No source of embolus   LDL 70  HgbA1c 5.4  Heparin 5000 units sq tid for VTE prophylaxis Fall precautions Diet Heart Room service appropriate? Yes; Fluid consistency: Thin  aspirin 81 mg daily prior to admission, now on aspirin 81 mg daily and clopidogrel 75 mg daily. Given mild stroke with NIHSS < 5 and on aspirin PTA, will place on aspirin 81 mg and plavix 75 mg daily x 3 weeks, then plavix alone.   Patient counseled to be compliant with his antithrombotic medications  Ongoing aggressive stroke risk factor management  Therapy recommendations:  CIR.   Disposition:  CIR  1010a - More lethargic this am with DOE/testing. Improved with increased alertness. Strength the same. May be attributable to decreased sleep overnight. Discussed repeat CT with Dr. Clementeen Graham. Will hold for now. Sats 97% He plans to check CXR. Also discussed with Dr. Erlinda Hong who will follow up later in the afternoon.  1330p - remains lethargic. Discussed with Dr. Naaman Plummer and Erlinda Hong. Dr. Erlinda Hong confirmed continued lethargy. Will check CT head and EEG. Hold on CIR  transfer. Dr. Naaman Plummer and DR. Dhungel made aware.  Hypertension  Stable . Permissive hypertension (OK if < 220/120) but gradually normalize in 5-7 days . Long-term BP goal normotensive  Hyperlipidemia  Home meds:  No statin  LDL 70, goal < 70  Pravachol 10 mg daily added  Continue statin at discharge  Other Stroke Risk Factors  Advanced age  Former Cigarette smoker, quit 42 years ago  ETOH use, advised to drink no more than 2 drink(s) a day  Hx TIA  Other Active Problems  Vitamin B-12 deficiency. Had been getting B-12 shots in the past. Plan outpatient monthly B-12 shots. Dr. Erlinda Hong requested 500 mg po VB 12 be added to regimen. Added this am. Family aware.  Chronic kidney disease stage III  Hospital day # 2  Burnetta Sabin, MSN, APRN, ANVP-BC, AGPCNP-BC Advanced Practice Stroke  Nurse [place] for Schedule & Pager information 04/05/2018 1:37 PM   ATTENDING NOTE: I reviewed above note and agree with the assessment and plan. I have made any additions or clarifications directly to the above note. Pt was seen and examined.    83 year old left handed male with history of BPH, HLD, HTN, CKD, dementia, history of TIA/stroke without residual, status post partial bowel resection due to ? Crohn's disease admitted for left-sided weakness and difficulty walking.  MRI showed right BG/CR acute infarct, as well as old left thalamic infarct.  Carotid Doppler unremarkable.  TCD diffuse atherosclerosis.  2D echo EF 55-60%.  LDL 70 and A1c 5.4 B12 295.  Creatinine 1.3.  His stroke most consistent with small vessel disease. He has ASA 56m PTA, now on DAPT with ASA 81 and plavix for 3 weeks and then plavix alone. Also put on low dose pravastatin yesterday.  However, pt today had dramatic change from yesterday. Pt very lethargic sitting in chair today, sleepy drowsy and needed continuous stimulation to keep wakefulness. Not orientated to place and time, needed 3 tries  to figure out her daughter name. Dysarthria with paucity of speech. Breathing pattern more Cheyne stokes breathing pattern. Still has left facial droop and left hemiparesis but more generalized weakness. CXR unremarkable. But WBC was high today at 20, and slightly worsening of Cre, but no fever. Will check UA, blood Cx, CT  Head stat and EEG as well as repeat CXR in am. Discontinue pravastatin for now, and put on gentle hydration. Hold off CIR transfer today. I discussed with pt daughter and she agrees with the plan.  JRosalin Hawking MD PhD Stroke Neurology 04/05/2018 3:17 PM  I spent  35 minutes in total face-to-face time with the patient, more than 50% of which was spent in counseling and coordination of care, reviewing test results, images and medication, and discussing the diagnosis of worsening mental status and lethargy, treatment plan and potential prognosis. This patient's care requiresreview of multiple databases, neurological assessment, discussion with family, other specialists and medical decision making of high complexity. I had long discussion with daughter at bedside, updated pt current condition, treatment plan. She expressed understanding and appreciation.     To contact Stroke Continuity provider, please refer to [email or web address] After hours, contact General Neurology

## 2018-04-05 NOTE — Progress Notes (Signed)
Inpatient Rehabilitation  Continuing to follow along for timing of medical readiness.  Discussed with team.  Will plan to follow up tomorrow.  Call if questions.   Carmelia Roller., CCC/SLP Admission Coordinator  [place]  Cell [phone]

## 2018-04-05 NOTE — Progress Notes (Signed)
She was found to be lethargic this morning and later this afternoon, slowly waking up and communicative.  Initially found to have increased weakness on his left side  which improved with similar deficit from yesterday..  Discussed with neurology and plan on obtaining a head CT along with EEG.  Discharge to CIR held.  Daughter updated on the phone.

## 2018-04-06 ENCOUNTER — Encounter (HOSPITAL_COMMUNITY): Payer: Self-pay

## 2018-04-06 ENCOUNTER — Inpatient Hospital Stay (HOSPITAL_COMMUNITY)
Admission: RE | Admit: 2018-04-06 | Discharge: 2018-04-22 | DRG: 057 | Disposition: A | Discharge status: Skilled Nursing Facility | Payer: Medicare Other | Source: Intra-hospital | Attending: Physical Medicine & Rehabilitation | Admitting: Physical Medicine & Rehabilitation

## 2018-04-06 ENCOUNTER — Inpatient Hospital Stay (HOSPITAL_COMMUNITY): Payer: Medicare Other

## 2018-04-06 ENCOUNTER — Other Ambulatory Visit: Payer: Self-pay

## 2018-04-06 DIAGNOSIS — M24562 Contracture, left knee: Secondary | ICD-10-CM | POA: Diagnosis present

## 2018-04-06 DIAGNOSIS — F329 Major depressive disorder, single episode, unspecified: Secondary | ICD-10-CM | POA: Diagnosis present

## 2018-04-06 DIAGNOSIS — Z9049 Acquired absence of other specified parts of digestive tract: Secondary | ICD-10-CM

## 2018-04-06 DIAGNOSIS — Z7902 Long term (current) use of antithrombotics/antiplatelets: Secondary | ICD-10-CM

## 2018-04-06 DIAGNOSIS — F0390 Unspecified dementia without behavioral disturbance: Secondary | ICD-10-CM | POA: Diagnosis present

## 2018-04-06 DIAGNOSIS — M24551 Contracture, right hip: Secondary | ICD-10-CM | POA: Diagnosis present

## 2018-04-06 DIAGNOSIS — R531 Weakness: Secondary | ICD-10-CM | POA: Diagnosis present

## 2018-04-06 DIAGNOSIS — Z888 Allergy status to other drugs, medicaments and biological substances status: Secondary | ICD-10-CM

## 2018-04-06 DIAGNOSIS — I10 Essential (primary) hypertension: Secondary | ICD-10-CM | POA: Diagnosis not present

## 2018-04-06 DIAGNOSIS — E785 Hyperlipidemia, unspecified: Secondary | ICD-10-CM

## 2018-04-06 DIAGNOSIS — I639 Cerebral infarction, unspecified: Secondary | ICD-10-CM | POA: Diagnosis present

## 2018-04-06 DIAGNOSIS — Z7982 Long term (current) use of aspirin: Secondary | ICD-10-CM

## 2018-04-06 DIAGNOSIS — K50919 Crohn's disease, unspecified, with unspecified complications: Secondary | ICD-10-CM

## 2018-04-06 DIAGNOSIS — M24561 Contracture, right knee: Secondary | ICD-10-CM | POA: Diagnosis present

## 2018-04-06 DIAGNOSIS — N390 Urinary tract infection, site not specified: Secondary | ICD-10-CM | POA: Diagnosis present

## 2018-04-06 DIAGNOSIS — N183 Chronic kidney disease, stage 3 unspecified: Secondary | ICD-10-CM

## 2018-04-06 DIAGNOSIS — E876 Hypokalemia: Secondary | ICD-10-CM | POA: Diagnosis present

## 2018-04-06 DIAGNOSIS — I452 Bifascicular block: Secondary | ICD-10-CM | POA: Diagnosis present

## 2018-04-06 DIAGNOSIS — I129 Hypertensive chronic kidney disease with stage 1 through stage 4 chronic kidney disease, or unspecified chronic kidney disease: Secondary | ICD-10-CM | POA: Diagnosis present

## 2018-04-06 DIAGNOSIS — K5090 Crohn's disease, unspecified, without complications: Secondary | ICD-10-CM | POA: Diagnosis present

## 2018-04-06 DIAGNOSIS — N400 Enlarged prostate without lower urinary tract symptoms: Secondary | ICD-10-CM | POA: Diagnosis present

## 2018-04-06 DIAGNOSIS — E538 Deficiency of other specified B group vitamins: Secondary | ICD-10-CM | POA: Diagnosis present

## 2018-04-06 DIAGNOSIS — Z981 Arthrodesis status: Secondary | ICD-10-CM

## 2018-04-06 DIAGNOSIS — D72829 Elevated white blood cell count, unspecified: Secondary | ICD-10-CM

## 2018-04-06 DIAGNOSIS — I69318 Other symptoms and signs involving cognitive functions following cerebral infarction: Principal | ICD-10-CM

## 2018-04-06 DIAGNOSIS — I69354 Hemiplegia and hemiparesis following cerebral infarction affecting left non-dominant side: Secondary | ICD-10-CM | POA: Diagnosis not present

## 2018-04-06 DIAGNOSIS — E039 Hypothyroidism, unspecified: Secondary | ICD-10-CM | POA: Diagnosis present

## 2018-04-06 DIAGNOSIS — I69398 Other sequelae of cerebral infarction: Secondary | ICD-10-CM | POA: Diagnosis not present

## 2018-04-06 DIAGNOSIS — K5900 Constipation, unspecified: Secondary | ICD-10-CM | POA: Diagnosis present

## 2018-04-06 DIAGNOSIS — E871 Hypo-osmolality and hyponatremia: Secondary | ICD-10-CM

## 2018-04-06 DIAGNOSIS — B961 Klebsiella pneumoniae [K. pneumoniae] as the cause of diseases classified elsewhere: Secondary | ICD-10-CM | POA: Diagnosis present

## 2018-04-06 DIAGNOSIS — M24552 Contracture, left hip: Secondary | ICD-10-CM | POA: Diagnosis present

## 2018-04-06 DIAGNOSIS — M1990 Unspecified osteoarthritis, unspecified site: Secondary | ICD-10-CM | POA: Diagnosis present

## 2018-04-06 DIAGNOSIS — I63311 Cerebral infarction due to thrombosis of right middle cerebral artery: Secondary | ICD-10-CM

## 2018-04-06 DIAGNOSIS — G479 Sleep disorder, unspecified: Secondary | ICD-10-CM

## 2018-04-06 DIAGNOSIS — L8990 Pressure ulcer of unspecified site, unspecified stage: Secondary | ICD-10-CM

## 2018-04-06 DIAGNOSIS — K219 Gastro-esophageal reflux disease without esophagitis: Secondary | ICD-10-CM | POA: Diagnosis present

## 2018-04-06 LAB — CBC
HEMATOCRIT: 37.2 % — AB (ref 39.0–52.0)
Hemoglobin: 12.6 g/dL — ABNORMAL LOW (ref 13.0–17.0)
MCH: 29.4 pg (ref 26.0–34.0)
MCHC: 33.9 g/dL (ref 30.0–36.0)
MCV: 86.9 fL (ref 78.0–100.0)
PLATELETS: 306 10*3/uL (ref 150–400)
RBC: 4.28 MIL/uL (ref 4.22–5.81)
RDW: 14.9 % (ref 11.5–15.5)
WBC: 13.7 10*3/uL — AB (ref 4.0–10.5)

## 2018-04-06 LAB — BASIC METABOLIC PANEL
ANION GAP: 13 (ref 5–15)
BUN: 17 mg/dL (ref 6–20)
CALCIUM: 8.7 mg/dL — AB (ref 8.9–10.3)
CO2: 22 mmol/L (ref 22–32)
Chloride: 99 mmol/L — ABNORMAL LOW (ref 101–111)
Creatinine, Ser: 1.2 mg/dL (ref 0.61–1.24)
GFR, EST NON AFRICAN AMERICAN: 55 mL/min — AB (ref >60)
GLUCOSE: 102 mg/dL — AB (ref 65–99)
POTASSIUM: 3.2 mmol/L — AB (ref 3.5–5.1)
Sodium: 134 mmol/L — ABNORMAL LOW (ref 135–145)

## 2018-04-06 MED ORDER — SODIUM CHLORIDE 0.9 % IV SOLN
1.0000 g | INTRAVENOUS | Status: DC
  Administered 2018-04-06 – 2018-04-10 (×5): 1 g via INTRAVENOUS
  Filled 2018-04-06 (×5): qty 10

## 2018-04-06 MED ORDER — ACETAMINOPHEN 650 MG RE SUPP: 650.0000 mg | RECTAL | Status: DC | PRN

## 2018-04-06 MED ORDER — ASPIRIN EC 81 MG PO TBEC
81.0000 mg | DELAYED_RELEASE_TABLET | Freq: Every day | ORAL | Status: DC
  Administered 2018-04-06 – 2018-04-22 (×17): 81 mg via ORAL
  Filled 2018-04-06 (×17): qty 1

## 2018-04-06 MED ORDER — ACETAMINOPHEN 325 MG PO TABS
650.0000 mg | ORAL_TABLET | ORAL | Status: DC | PRN
  Administered 2018-04-11: 650 mg via ORAL
  Filled 2018-04-06: qty 2

## 2018-04-06 MED ORDER — SORBITOL 70 % SOLN
30.0000 mL | Freq: Every day | Status: DC | PRN
  Administered 2018-04-09: 30 mL via ORAL
  Filled 2018-04-06 (×2): qty 30

## 2018-04-06 MED ORDER — OXYBUTYNIN CHLORIDE 5 MG PO TABS
5.0000 mg | ORAL_TABLET | Freq: Every day | ORAL | Status: DC
  Administered 2018-04-07 – 2018-04-22 (×16): 5 mg via ORAL
  Filled 2018-04-06 (×16): qty 1

## 2018-04-06 MED ORDER — PANTOPRAZOLE SODIUM 40 MG PO TBEC
40.0000 mg | DELAYED_RELEASE_TABLET | Freq: Two times a day (BID) | ORAL | Status: DC
  Administered 2018-04-06 – 2018-04-22 (×32): 40 mg via ORAL
  Filled 2018-04-06 (×32): qty 1

## 2018-04-06 MED ORDER — VITAMIN B-12 1000 MCG PO TABS
500.0000 ug | ORAL_TABLET | Freq: Every day | ORAL | Status: DC
  Administered 2018-04-07 – 2018-04-22 (×16): 500 ug via ORAL
  Filled 2018-04-06 (×16): qty 1

## 2018-04-06 MED ORDER — IPRATROPIUM BROMIDE 0.06 % NA SOLN
2.0000 | Freq: Four times a day (QID) | NASAL | Status: DC
  Administered 2018-04-06 – 2018-04-22 (×45): 2 via NASAL
  Filled 2018-04-06 (×2): qty 15

## 2018-04-06 MED ORDER — LEVOTHYROXINE SODIUM 50 MCG PO TABS
25.0000 ug | ORAL_TABLET | Freq: Every day | ORAL | Status: DC
  Administered 2018-04-07 – 2018-04-11 (×5): 25 ug via ORAL
  Filled 2018-04-06 (×4): qty 1

## 2018-04-06 MED ORDER — HEPARIN SODIUM (PORCINE) 5000 UNIT/ML IJ SOLN
5000.0000 [IU] | Freq: Three times a day (TID) | INTRAMUSCULAR | Status: DC
  Administered 2018-04-06 – 2018-04-22 (×47): 5000 [IU] via SUBCUTANEOUS
  Filled 2018-04-06 (×47): qty 1

## 2018-04-06 MED ORDER — CLOPIDOGREL BISULFATE 75 MG PO TABS: 75.0000 mg | ORAL_TABLET | Freq: Every day | ORAL | Status: DC

## 2018-04-06 MED ORDER — ONDANSETRON HCL 4 MG/2ML IJ SOLN: 4.0000 mg | Freq: Four times a day (QID) | INTRAMUSCULAR | Status: DC | PRN

## 2018-04-06 MED ORDER — CLOPIDOGREL BISULFATE 75 MG PO TABS
75.0000 mg | ORAL_TABLET | Freq: Every day | ORAL | Status: DC
  Administered 2018-04-07 – 2018-04-22 (×16): 75 mg via ORAL
  Filled 2018-04-06 (×16): qty 1

## 2018-04-06 MED ORDER — HEPARIN SODIUM (PORCINE) 5000 UNIT/ML IJ SOLN: 5000.0000 [IU] | Freq: Three times a day (TID) | INTRAMUSCULAR | Status: DC

## 2018-04-06 MED ORDER — GUAIFENESIN-DM 100-10 MG/5ML PO SYRP: 10.0000 mL | ORAL_SOLUTION | Freq: Four times a day (QID) | ORAL | Status: DC | PRN

## 2018-04-06 MED ORDER — HYDROCHLOROTHIAZIDE 25 MG PO TABS
25.0000 mg | ORAL_TABLET | Freq: Every day | ORAL | Status: DC
  Administered 2018-04-06 – 2018-04-16 (×11): 25 mg via ORAL
  Filled 2018-04-06 (×11): qty 1

## 2018-04-06 MED ORDER — IMIPRAMINE HCL 25 MG PO TABS
25.0000 mg | ORAL_TABLET | Freq: Every day | ORAL | Status: DC
  Administered 2018-04-06 – 2018-04-07 (×2): 25 mg via ORAL
  Filled 2018-04-06 (×2): qty 1

## 2018-04-06 MED ORDER — POTASSIUM CHLORIDE CRYS ER 20 MEQ PO TBCR
40.0000 meq | EXTENDED_RELEASE_TABLET | Freq: Once | ORAL | Status: AC
  Administered 2018-04-06: 40 meq via ORAL
  Filled 2018-04-06: qty 2

## 2018-04-06 MED ORDER — ONDANSETRON HCL 4 MG PO TABS: 4.0000 mg | ORAL_TABLET | Freq: Four times a day (QID) | ORAL | Status: DC | PRN

## 2018-04-06 MED ORDER — ACETAMINOPHEN 160 MG/5ML PO SOLN: 650.0000 mg | ORAL | Status: DC | PRN

## 2018-04-06 MED ORDER — TAMSULOSIN HCL 0.4 MG PO CAPS
0.4000 mg | ORAL_CAPSULE | Freq: Every day | ORAL | Status: DC
  Administered 2018-04-07 – 2018-04-22 (×16): 0.4 mg via ORAL
  Filled 2018-04-06 (×16): qty 1

## 2018-04-06 MED ORDER — SENNOSIDES-DOCUSATE SODIUM 8.6-50 MG PO TABS
1.0000 | ORAL_TABLET | Freq: Every evening | ORAL | Status: DC | PRN
  Administered 2018-04-21: 1 via ORAL
  Filled 2018-04-06: qty 1

## 2018-04-06 MED ORDER — LORATADINE 10 MG PO TABS
10.0000 mg | ORAL_TABLET | Freq: Every day | ORAL | Status: DC
  Administered 2018-04-06 – 2018-04-22 (×17): 10 mg via ORAL
  Filled 2018-04-06 (×17): qty 1

## 2018-04-06 MED ORDER — SODIUM CHLORIDE 0.9 % IV SOLN: 1.0000 g | INTRAVENOUS | 0 refills | Status: DC

## 2018-04-06 NOTE — Progress Notes (Signed)
Patient arrived to unit and oriented. No complaints of pain at this time. Resting comfortably witl call bell in place,

## 2018-04-06 NOTE — Progress Notes (Addendum)
STROKE TEAM PROGRESS NOTE   SUBJECTIVE (INTERVAL HISTORY) No family present. Pt sleeping in bed. More easily awakens today than yesterday but still lethargic. Found to have UTI yest and started on abx. Repeat CXR neg. CT without stroke worsening. Plans for d/c to CIR today.  OBJECTIVE Vitals:   04/05/18 2010 04/05/18 2353 04/06/18 0411 04/06/18 0846  BP: 140/66 139/78 (!) 151/80 (!) 148/78  Pulse: 64 64 66 68  Resp: 18 18 18 18   Temp: 98.4 F (36.9 C) 98.6 F (37 C) 98.2 F (36.8 C) 98.1 F (36.7 C)  TempSrc: Oral Oral Oral Oral  SpO2: 96% 94% 97% 98%  Weight:      Height:        CBC:  Recent Labs  Lab 04/03/18 1019  04/05/18 1352 04/06/18 0441  WBC 7.7   < > 20.6* 13.7*  NEUTROABS 4.8  --   --   --   HGB 13.4   < > 13.8 12.6*  HCT 40.1   < > 41.0 37.2*  MCV 87.6   < > 87.4 86.9  PLT 354   < > 323 306   < > = values in this interval not displayed.    Basic Metabolic Panel:  Recent Labs  Lab 04/05/18 1352 04/06/18 0441  NA 133* 134*  K 3.8 3.2*  CL 97* 99*  CO2 22 22  GLUCOSE 105* 102*  BUN 17 17  CREATININE 1.33* 1.20  CALCIUM 9.3 8.7*    Lipid Panel:     Component Value Date/Time   CHOL 155 04/04/2018 0608   TRIG 276 (H) 04/04/2018 0608   HDL 30 (L) 04/04/2018 0608   CHOLHDL 5.2 04/04/2018 0608   VLDL 55 (H) 04/04/2018 0608   LDLCALC 70 04/04/2018 0608   HgbA1c:  Lab Results  Component Value Date   HGBA1C 5.4 04/04/2018   Urine Drug Screen: No results found for: LABOPIA, COCAINSCRNUR, LABBENZ, AMPHETMU, THCU, LABBARB  Alcohol Level No results found for: [facility]  IMAGING  Dg Chest 2 View 04/03/2018 No active cardiopulmonary disease.  04/05/2018 Mild bibasilar subsegmental atelectasis and/or scarring. 04/06/2018 No active disease.  Ct Head Wo Contrast 04/03/2018 1. No acute intracranial abnormalities. 2. Atrophy and chronic microvascular ischemic change. Stable appearance from the prior study.  04/05/2018 1. Expected evolution acute/subacute  infarcts involving the right corona radiata and basal ganglia. 2. No hemorrhage or other acute abnormality. 3. Stable diffuse white matter disease.  Mr Brain Wo Contrast (neuro Protocol) 04/03/2018 Small areas of acute infarction affecting the posterior right basal ganglia and radiating white matter tracts on the right. No swelling or hemorrhage. Extensive chronic small-vessel ischemic changes elsewhere throughout the brain as outlined above.   2D Echocardiogram  EF 55-60% with no source of embolus.   Carotid Doppler   There is 1-39% bilateral ICA stenosis. Vertebral artery flow is antegrade.    Transcranial Doppler Poor L temporal window.  Diffuse intracranial atherosclerosis.   EEG This awake and asleep EEG is abnormal due to mild to moderate diffuse background slowing.   PHYSICAL EXAM GENERAL: sleeping sitting up in bed. Awakens to voice but remains lethargic (less than yesterday). With stimulation, more interactive. Alert and oriented to person and place. Poor effort with exam. Easily returns to sleep HEENT: - Normocephalic and atraumatic, dry mm LUNGS - Clear to auscultation bilaterally with no wheezes CV - S1S2 RRR, no m/r/g, equal pulses bilaterally. Ext: warm, well perfused, intact peripheral pulses, no edema NEURO:  Mental Status: Awake, alert,  oriented x2. Dysarthria . No aphasia. Can follow commands. Cranial Nerves: PERRL EOMI, visual fields full, mild left lower facial weakness, facial sensation intact, hearing intact, tongue/uvula/soft palate midline, normal sternocleidomastoid and trapezius muscle strength. No evidence of tongue atrophy or fibrillations Motor: strength 5/5 on the R, LUE 4/5 and LLE 4/5, There is a drift in left upper extremity. Grip slightly less on the L, same as previous. L leg drift when lifting both legs Tone: symmetric Sensation- decreased bilaterally but symmetric Coordination: FTN decreased LUE, ok RUE.  Gait- deferred.    ASSESSMENT/PLAN Mr.  Jourden Gilson is a 83 y.o. L handed white male with history of hypertension, hyperlipidemia, TIA, BPH, GERD, chronic kidney disease stage III, hypothyroidism, Crohn's disease status post colectomy as a teenager, suspected peripheral neuropathy, and former smoker presenting after waking with left hemiparesis and difficulty walking.   Stroke:  right basal ganglia white matter infarct secondary to small vessel disease source  Resultant  Left hemiparesis  CT head Small vessel disease. Atrophy.   MRI head right basal ganglia white matter infarct. Extensive small vessel disease  Repeat CT head 4/9 with expected evoluation of R BG infarct  TCD pending   Carotid Doppler  B ICA 1-39% stenosis, VAs antegrade   2D Echo  EF 55-60%. No source of embolus   EEG mild to moderate diffuse background slowing.  LDL 70  HgbA1c 5.4  Heparin 5000 units sq tid for VTE prophylaxis Fall precautions Diet Heart Room service appropriate? Yes; Fluid consistency: Thin  aspirin 81 mg daily prior to admission, now on aspirin 81 mg daily and clopidogrel 75 mg daily. Given mild stroke with NIHSS < 5 and on aspirin PTA, will place on aspirin 81 mg and plavix 75 mg daily x 3 weeks, then plavix alone.   Patient counseled to be compliant with his antithrombotic medications  Ongoing aggressive stroke risk factor management  Therapy recommendations:  CIR.   Disposition:  CIR  Medical stable for transfer to CIR from stroke standpoint Stroke team will sign off. Follow up in the office in 4 weeks. Orders placed.  Acute metabolic encephalopathy d/t UTI  Positive UA  Ucx pending  BCx pending   On rocephin  WBC 7.3->20.6->13.7  Hypertension  Stable . Permissive hypertension (OK if < 220/120) but gradually normalize in 5-7 days . Long-term BP goal normotensive  Hyperlipidemia  Home meds:  No statin  LDL 70, goal < 70  Pravachol 10 mg daily added but on hold with neuro worsening  resume statin once  mental status improved  Other Stroke Risk Factors  Advanced age  Former Cigarette smoker, quit 67 years ago  ETOH use, advised to drink no more than 2 drink(s) a day  Hx TIA  Other Active Problems  Vitamin B-12 deficiency. Had been getting B-12 shots in the past. Plan outpatient monthly B-12 shots. Dr. Erlinda Hong requested 500 mg po VB 12 be added to regimen. Added this am. Family aware. Chronic kidney disease stage III, Cr 1.09->1.33->1.22  Hospital day # [place], MSN, APRN, ANVP-BC, AGPCNP-BC Advanced Practice Stroke Nurse [place] for Schedule & Pager information 04/06/2018 2:05 PM   ATTENDING NOTE: I reviewed above note and agree with the assessment and plan. I have made any additions or clarifications directly to the above note. Pt was seen and examined.   Pt much improved from yesterday. Sitting in chair eating lunch and still has difficulty with left hand, but awake alert, orientated  and speech clear and joking with provider. UA showed UTI yesterday and WBC showed leukocytosis, started on rocephin. Urine culture pending. PT/OT recommend CIR. Hopefully CIR admission this afternoon. Low B12 will give B12 supplement.   Neurology will sign off. Please call with questions. Pt will follow up with stroke clinic NP at [facility] in about 4 weeks. Thanks for the consult.   Rosalin Hawking, MD PhD Stroke Neurology 04/06/2018 8:20 PM    To contact Stroke Continuity provider, please refer to [email or web address] After hours, contact General Neurology

## 2018-04-06 NOTE — H&P (Signed)
Physical Medicine and Rehabilitation Admission H&P    Chief Complaint  Patient presents with  . Extremity Weakness  : HPI: [name] is a 83 year old right-handed male with history of mild dementia, previous CVA, BPH, CKD stage III, multiple colon resections due to Crohn's disease.  History taken from chart review and patient. He is a resident of [facility] assisted [facility] facility.  He was able to complete his own bathing, dressing and walks to the dining hall for his meals using a rolling walker.  He does not drive.  Presented 04/03/2018 with altered mental status.  There was reports of dizziness.  Cranial CT scan reviewed, unremarkable for acute intracranial process. MRI showed small areas of acute infarction affecting the posterior right basal ganglia and radiating white matter tracts on the right.  No swelling or hemorrhage.  Extensive chronic small vessel ischemic changes.  Patient did not receive TPA.  Carotid Dopplers with no ICA stenosis.  Echocardiogram with ejection fraction of 60% no wall motion abnormalities.  No defect or PFO noted..  Maintained on aspirin as well as Plavix for CVA prophylaxis.  Subcutaneous heparin for DVT prophylaxis.  A follow-up chest x-ray 04/05/2018 for cough showed mild bibasilar subsegmental atelectasis.  Tolerating a regular diet.  On 04/05/2018 noted some increased lethargy a follow-up cranial CT scan was completed as well as EEG that showed no seizure and scan showed expected evolution of acute subacute infarcts involving the right corona radiata and basal ganglia.  No hemorrhage or other acute abnormality.  Patient with elevated WBCs at 20,600 as well as creatinine mildly elevated 1.33 04/05/2018.  A follow-up urinalysis study positive nitrite with large leukocytes 04/05/2018 with culture pending and placed on empiric Rocephin and repeat WBC of 13,700 as well as creatinine 1.20.  Physical Occupational Therapy evaluations completed with recommendations of physical  medicine rehab consult.  Patient was admitted for a comprehensive rehab program  Review of Systems  Constitutional: Negative for chills and fever.  HENT: Negative for hearing loss.   Eyes: Negative for blurred vision and double vision.  Respiratory:       Occasional shortness of breath with exertion  Cardiovascular: Negative for chest pain and leg swelling.  Gastrointestinal: Positive for constipation. Negative for nausea and vomiting.       GERD  Genitourinary: Positive for urgency.  Musculoskeletal: Positive for joint pain and myalgias.  Skin: Negative for rash.  Neurological: Negative for seizures.  Psychiatric/Behavioral: Positive for depression and memory loss.  All other systems reviewed and are negative.  Past Medical History:  Diagnosis Date  . Arthritis    "joints" (10/15/2017)  . Benign prostatic hyperplasia   . Complication of anesthesia    "last OR in 06/2017 affected him cognitively; hasn't got back to baseline yet" (10/15/2017)  . Dementia   . Depression   . GERD (gastroesophageal reflux disease)   . High cholesterol   . Hypertension   . Hypothyroidism   . Renal disease   . Renal disorder   . TIA (transient ischemic attack) early 2000s   Past Surgical History:  Procedure Laterality Date  . ABDOMINAL EXPLORATION SURGERY  7/  . APPENDECTOMY    . CATARACT EXTRACTION W/ INTRAOCULAR LENS  IMPLANT, BILATERAL Bilateral   . COLECTOMY  1950s   "thought he had iteilis"  . ESOPHAGOGASTRODUODENOSCOPY N/A 10/22/2017   Procedure: ESOPHAGOGASTRODUODENOSCOPY (EGD);  Surgeon: Gatha Mayer, MD;  Location: [facility] ENDOSCOPY;  Service: Endoscopy;  Laterality: N/A;  . ESOPHAGOGASTRODUODENOSCOPY (EGD) WITH  PROPOFOL N/A 10/16/2017   Procedure: ESOPHAGOGASTRODUODENOSCOPY (EGD) WITH PROPOFOL;  Surgeon: Milus Banister, MD;  Location: [facility] ENDOSCOPY;  Service: Endoscopy;  Laterality: N/A;  . FEMUR FRACTURE SURGERY Right 1950s  . FRACTURE SURGERY    . KNEE ARTHROSCOPY Left   .  POSTERIOR LUMBAR FUSION    . ROTATOR CUFF REPAIR Left   . TONSILLECTOMY     No family history on file. Social History:  reports that he quit smoking about 56 years ago. His smoking use included cigarettes. He quit after 7.00 years of use. He has never used smokeless tobacco. He reports that he drinks alcohol. He reports that he does not use drugs. Allergies:  Allergies  Allergen Reactions  . Lisinopril Swelling    Swelling of lips    Medications Prior to Admission  Medication Sig Dispense Refill  . aspirin EC 81 MG tablet Take 81 mg by mouth daily.    . cefTRIAXone 1 g in sodium chloride 0.9 % 100 mL Inject 1 g into the vein daily. 1 Dose 0  . cetirizine (ZYRTEC) 10 MG tablet Take 10 mg by mouth daily.    . clopidogrel (PLAVIX) 75 MG tablet Take 1 tablet (75 mg total) by mouth daily. 30 tablet 0  . hydrochlorothiazide (HYDRODIURIL) 25 MG tablet Take 25 mg by mouth daily.    Marland Kitchen imipramine (TOFRANIL) 25 MG tablet Take 25 mg by mouth at bedtime.    Marland Kitchen ipratropium (ATROVENT) 0.06 % nasal spray Place 2 sprays into both nostrils 4 (four) times daily.    Marland Kitchen levothyroxine (SYNTHROID, LEVOTHROID) 50 MCG tablet Take 1 tablet (50 mcg total) by mouth every morning. 50 tablet 0  . oxybutynin (DITROPAN) 5 MG tablet Take 5 mg by mouth every morning.    . pantoprazole (PROTONIX) 40 MG tablet Take 1 tablet (40 mg total) by mouth daily. 30 tablet 0  . pravastatin (PRAVACHOL) 10 MG tablet Take 1 tablet (10 mg total) by mouth daily at 6 PM. 30 tablet 0  . promethazine-dextromethorphan (PROMETHAZINE-DM) 6.25-15 MG/5ML syrup Take 10 mLs by mouth 4 (four) times daily as needed for cough.    . senna-docusate (SENOKOT-S) 8.6-50 MG tablet Take 1 tablet by mouth at bedtime as needed for moderate constipation. 10 tablet 0  . tamsulosin (FLOMAX) 0.4 MG CAPS capsule Take 1 capsule (0.4 mg total) by mouth daily. 14 capsule 0    Drug Regimen Review Drug regimen was reviewed and remains appropriate with no significant  issues identified  Home: Home Living Family/patient expects to be discharged to:: Inpatient rehab Type of Home: Assisted living Additional Comments: Resident at [facility]. All PLOF and home set up confirmed by daughter, who is a [facility] [employer] SLP. Pt    Functional History: Prior Function Level of Independence: Independent with assistive device(s) Comments: uses rollator and completes own dressing and bathing. Walks to meals  Functional Status:  Mobility: Bed Mobility Overal bed mobility: Needs Assistance Bed Mobility: Rolling, Sidelying to Sit Rolling: Min guard Sidelying to sit: HOB elevated, Min assist Supine to sit: Min assist Sit to supine: +2 for physical assistance, Max assist General bed mobility comments: Cues for sequencing and technique, use of LUE to grab rail and assist to elevate trunk. Increased time.  Transfers Overall transfer level: Needs assistance Equipment used: 4-wheeled walker Transfers: Sit to/from Stand Sit to Stand: Min assist Stand pivot transfers: Max assist General transfer comment: Assist to power to standing from EOB, with bil knee flexion and flexed trunk. Assist to  place LUE on rollator handle.  Ambulation/Gait Ambulation/Gait assistance: Mod assist Ambulation Distance (Feet): 3 Feet Assistive device: 4-wheeled walker Gait Pattern/deviations: Step-to pattern, Decreased step length - left, Trunk flexed General Gait Details: Able to take a few steps to get to chair with difficulty advancing/sequencing LLE and left knee instability noted. Assist with rollator navigation for turn and to maintain Left hand on walker handle. Gait velocity: slow Gait velocity interpretation: Below normal speed for age/gender    ADL: ADL Overall ADL's : Needs assistance/impaired Eating/Feeding: Moderate assistance, Sitting Grooming: Wash/dry face, Minimal assistance, Sitting Grooming Details (indicate cue type and reason): Pt washign his face with increased time while  sitting in recliner. Pt using RUE only Upper Body Bathing: Moderate assistance, Sitting Lower Body Bathing: Maximal assistance, Sit to/from stand Upper Body Dressing : Moderate assistance, Sitting Lower Body Dressing: Maximal assistance, Sit to/from stand Lower Body Dressing Details (indicate cue type and reason): Pt unable to lean forward to adjust socks Toilet Transfer: Moderate assistance, +2 for physical assistance Toilet Transfer Details (indicate cue type and reason): Pt performing sit<>stand only with Mod A +2 for stability and to power into standing Functional mobility during ADLs: Moderate assistance, +2 for physical assistance(sit<>stand only) General ADL Comments: Pt lethargic and fatigued. Continue to be motivated to participate in therapy despite fatigue.   Cognition: Cognition Overall Cognitive Status: Impaired/Different from baseline Arousal/Alertness: Awake/alert Orientation Level: Disoriented to situation, Oriented to person, Oriented to place, Disoriented to time Attention: Sustained Sustained Attention: Appears intact Memory: Impaired Memory Impairment: Retrieval deficit Awareness: Impaired Awareness Impairment: Intellectual impairment, Anticipatory impairment Problem Solving: Impaired Problem Solving Impairment: Functional complex Safety/Judgment: Impaired Comments: per daughter, patient attempting to get up on own Cognition Arousal/Alertness: Awake/alert Behavior During Therapy: Flat affect Overall Cognitive Status: Impaired/Different from baseline Area of Impairment: Orientation Orientation Level: Disoriented to, Place, Time Current Attention Level: Sustained Following Commands: Follows one step commands with increased time Problem Solving: Slow processing, Requires verbal cues, Difficulty sequencing General Comments: Slow processing noted today. Does not recall yesterday. Cannot state where he is located today but knows he is in a hospital. does not know  where he lives. Asked about [place] and he said no (which is where he came from). Does not know the year.   Physical Exam: Blood pressure (!) 147/76, pulse 64, temperature 97.7 F (36.5 C), temperature source Oral, resp. rate 17, height 5' 5"  (1.651 m), weight 79.4 kg (175 lb), SpO2 97 %. Physical Exam  Vitals reviewed. Constitutional: He appears well-developed and well-nourished.  HENT:  Head: Normocephalic and atraumatic.  Eyes: EOM are normal. Right eye exhibits no discharge. Left eye exhibits no discharge.  Pupils reactive to light  Neck: Normal range of motion. Neck supple. No tracheal deviation present. No thyromegaly present.  Cardiovascular: Normal rate, regular rhythm and normal heart sounds.  Respiratory:  Fair inspiratory effort with some decreased breath sounds at the bases  GI: Soft. Bowel sounds are normal. He exhibits no distension.  Musculoskeletal:  No edema or tenderness in extremities  Neurological: He is alert.  Mild left facial droop Follows simple commands.   Motor: RUE/RLE: 4+/5 proximal to distal LUE/LLE: 4/5 proximal to distal Sensation intact to light touch  Skin: Skin is warm and dry.  Psychiatric: He has a normal mood and affect. His behavior is normal.      Results for orders placed or performed during the hospital encounter of 04/03/18 (from the past 48 hour(s))  CBC     Status: Abnormal  Collection Time: 04/05/18  1:52 PM  Result Value Ref Range   WBC 20.6 (H) 4.0 - 10.5 K/uL   RBC 4.69 4.22 - 5.81 MIL/uL   Hemoglobin 13.8 13.0 - 17.0 g/dL   HCT 41.0 39.0 - 52.0 %   MCV 87.4 78.0 - 100.0 fL   MCH 29.4 26.0 - 34.0 pg   MCHC 33.7 30.0 - 36.0 g/dL   RDW 14.8 11.5 - 15.5 %   Platelets 323 150 - 400 K/uL    Comment: Performed at [facility], [place] [address], [place], [place] [address]  Basic metabolic panel     Status: Abnormal   Collection Time: 04/05/18  1:52 PM  Result Value Ref Range   Sodium 133 (L) 135 - 145 mmol/L    Potassium 3.8 3.5 - 5.1 mmol/L   Chloride 97 (L) 101 - 111 mmol/L   CO2 22 22 - 32 mmol/L   Glucose, Bld 105 (H) 65 - 99 mg/dL   BUN 17 6 - 20 mg/dL   Creatinine, Ser 1.33 (H) 0.61 - 1.24 mg/dL   Calcium 9.3 8.9 - 10.3 mg/dL   GFR calc non Af Amer 49 (L) >60 mL/min   GFR calc Af Amer 57 (L) >60 mL/min    Comment: (NOTE) The eGFR has been calculated using the CKD EPI equation. This calculation has not been validated in all clinical situations. eGFR's persistently <60 mL/min signify possible Chronic Kidney Disease.    Anion gap 14 5 - 15    Comment: Performed at [facility] [address] [place], [place] [address]  Urinalysis, Complete w Microscopic     Status: Abnormal   Collection Time: 04/05/18  3:09 PM  Result Value Ref Range   Color, Urine YELLOW YELLOW   APPearance HAZY (A) CLEAR   Specific Gravity, Urine 1.017 1.005 - 1.030   pH 5.0 5.0 - 8.0   Glucose, UA NEGATIVE NEGATIVE mg/dL   Hgb urine dipstick MODERATE (A) NEGATIVE   Bilirubin Urine NEGATIVE NEGATIVE   Ketones, ur NEGATIVE NEGATIVE mg/dL   Protein, ur 30 (A) NEGATIVE mg/dL   Nitrite POSITIVE (A) NEGATIVE   Leukocytes, UA LARGE (A) NEGATIVE   RBC / HPF 0-5 0 - 5 RBC/hpf   WBC, UA TOO NUMEROUS TO COUNT 0 - 5 WBC/hpf   Bacteria, UA RARE (A) NONE SEEN   Squamous Epithelial / LPF 0-5 (A) NONE SEEN   Mucus PRESENT     Comment: Performed at [facility], [place] [address] [place], [place] [address]  Culture, blood (Routine X 2) w Reflex to ID Panel     Status: None (Preliminary result)   Collection Time: 04/05/18  3:51 PM  Result Value Ref Range   Specimen Description BLOOD LEFT HAND    Special Requests      BOTTLES DRAWN AEROBIC ONLY Blood Culture results may not be optimal due to an inadequate volume of blood received in culture bottles   Culture      NO GROWTH < 24 HOURS Performed at [facility] [address] [place], [place] [address]    Report Status PENDING   Culture, blood (Routine X  2) w Reflex to ID Panel     Status: None (Preliminary result)   Collection Time: 04/05/18  3:55 PM  Result Value Ref Range   Specimen Description BLOOD RIGHT HAND    Special Requests      BOTTLES DRAWN AEROBIC AND ANAEROBIC Blood Culture  adequate volume   Culture      NO GROWTH < 24 HOURS Performed at [facility] [address], [place], [place] [address]    Report Status PENDING   Urinalysis, Complete w Microscopic     Status: Abnormal   Collection Time: 04/05/18  8:00 PM  Result Value Ref Range   Color, Urine YELLOW YELLOW   APPearance HAZY (A) CLEAR   Specific Gravity, Urine 1.005 1.005 - 1.030   pH 5.0 5.0 - 8.0   Glucose, UA NEGATIVE NEGATIVE mg/dL   Hgb urine dipstick MODERATE (A) NEGATIVE   Bilirubin Urine NEGATIVE NEGATIVE   Ketones, ur NEGATIVE NEGATIVE mg/dL   Protein, ur NEGATIVE NEGATIVE mg/dL   Nitrite NEGATIVE NEGATIVE   Leukocytes, UA LARGE (A) NEGATIVE   RBC / HPF 0-5 0 - 5 RBC/hpf   WBC, UA TOO NUMEROUS TO COUNT 0 - 5 WBC/hpf   Bacteria, UA MANY (A) NONE SEEN   Squamous Epithelial / LPF 0-5 (A) NONE SEEN   WBC Clumps PRESENT    Mucus PRESENT    Hyaline Casts, UA PRESENT     Comment: Performed at [facility], [place] [address], [place], [place] [address]  CBC     Status: Abnormal   Collection Time: 04/06/18  4:41 AM  Result Value Ref Range   WBC 13.7 (H) 4.0 - 10.5 K/uL   RBC 4.28 4.22 - 5.81 MIL/uL   Hemoglobin 12.6 (L) 13.0 - 17.0 g/dL   HCT 37.2 (L) 39.0 - 52.0 %   MCV 86.9 78.0 - 100.0 fL   MCH 29.4 26.0 - 34.0 pg   MCHC 33.9 30.0 - 36.0 g/dL   RDW 14.9 11.5 - 15.5 %   Platelets 306 150 - 400 K/uL    Comment: Performed at [facility], [place] [address] [place], [place] [address]  Basic metabolic panel     Status: Abnormal   Collection Time: 04/06/18  4:41 AM  Result Value Ref Range   Sodium 134 (L) 135 - 145 mmol/L   Potassium 3.2 (L) 3.5 - 5.1 mmol/L   Chloride 99 (L) 101 - 111 mmol/L   CO2 22 22 - 32 mmol/L   Glucose, Bld 102  (H) 65 - 99 mg/dL   BUN 17 6 - 20 mg/dL   Creatinine, Ser 1.20 0.61 - 1.24 mg/dL   Calcium 8.7 (L) 8.9 - 10.3 mg/dL   GFR calc non Af Amer 55 (L) >60 mL/min   GFR calc Af Amer >60 >60 mL/min    Comment: (NOTE) The eGFR has been calculated using the CKD EPI equation. This calculation has not been validated in all clinical situations. eGFR's persistently <60 mL/min signify possible Chronic Kidney Disease.    Anion gap 13 5 - 15    Comment: Performed at [facility] [address], [place], [place] [address]   Ct Head Wo Contrast  Result Date: 04/05/2018 CLINICAL DATA:  Stroke, follow-up. EXAM: CT HEAD WITHOUT CONTRAST TECHNIQUE: Contiguous axial images were obtained from the base of the skull through the vertex without intravenous contrast. COMPARISON:  MRI and CT of the head 04/03/2018. FINDINGS: Brain: The study is mildly degraded by patient motion. Focal infarct involving the right corona radiata and basal ganglia is noted. Moderate diffuse white matter changes also present. White matter changes within the internal capsule are again noted bilaterally. No acute hemorrhage is present. The brainstem demonstrates white matter disease. The cerebellum is unremarkable. Vascular:  Atherosclerotic calcifications are present within the cavernous internal carotid arteries bilaterally. There is no hyperdense vessel. Skull: Calvarium is intact. No focal lytic or blastic lesions are present. Sinuses/Orbits: The paranasal sinuses and mastoid air cells are clear. Bilateral lens replacements are present. Globes and orbits are within normal limits. IMPRESSION: 1. Expected evolution acute/subacute infarcts involving the right corona radiata and basal ganglia. 2. No hemorrhage or other acute abnormality. 3. Stable diffuse white matter disease. Electronically Signed   By: [name] M.D.   On: 04/05/2018 16:40   Dg Chest Port 1 View  Result Date: 04/06/2018 CLINICAL DATA:  Leukocytosis EXAM: PORTABLE  CHEST 1 VIEW COMPARISON:  Chest x-rays dated 04/05/2018 and 04/03/2018. FINDINGS: Mild cardiomegaly is stable. Overall cardiomediastinal silhouette is stable. Lungs are clear. No pleural effusion or pneumothorax seen. No acute or suspicious osseous finding. IMPRESSION: No active disease. Electronically Signed   By: Franki Cabot M.D.   On: 04/06/2018 08:25   Dg Chest Port 1 View  Result Date: 04/05/2018 CLINICAL DATA:  Seven cough.  Shortness of breath. EXAM: PORTABLE CHEST 1 VIEW COMPARISON:  02/02/2018. FINDINGS: Mediastinum and hilar structures normal. Mild cardiomegaly with normal pulmonary vascularity. Mild bibasilar subsegmental atelectasis and/or scarring. No pleural effusion or pneumothorax. Degenerative changes scoliosis thoracic spine. IMPRESSION: Mild bibasilar subsegmental atelectasis and/or scarring. Electronically Signed   By: Marcello Moores  Register   On: 04/05/2018 10:34       Medical Problem List and Plan: 1.  Altered mental status with decreased functional mobility secondary to right basal ganglia infarction.  Plan aspirin and Plavix times 3 weeks then Plavix alone 2.  DVT Prophylaxis/Anticoagulation: Subcutaneous heparin. 3. Pain Management: Tylenol as needed 4. Mood/mild dementia.  Tofranil 25 mg nightly.  Discuss baseline with family 5. Neuropsych: This patient is capable of making decisions on his own behalf. 6. Skin/Wound Care: Routine skin checks 7. Fluids/Electrolytes/Nutrition: Routine INO's with follow-up chemistries 8.  CKD stage III.  Follow-up chemistries 9.  Hypertension.  HCTZ 25 mg daily.  Monitor with increased mobility 10.  History of BPH.  Currently on both Flomax and Ditropan as prior to admission.  Check PVR x3 11.  UTI/leukocytosis.  Continue Rocephin as directed 12.  Hyperlipidemia.  Pravachol 13.  Multiple colon resections due to Crohn's disease.  Continue to monitor 14.  Hypothyroidism.  TSH 4.601.Synthroid 15.  Vitamin B12 deficiency.  Continue vitamin  B12   Post Admission Physician Evaluation: 1. Preadmission assessment reviewed and changes made below. 2. Functional deficits secondary  to right basal ganglia infarction. 3. Patient is admitted to receive collaborative, interdisciplinary care between the physiatrist, rehab nursing staff, and therapy team. 4. Patient's level of medical complexity and substantial therapy needs in context of that medical necessity cannot be provided at a lesser intensity of care such as a SNF. 5. Patient has experienced substantial functional loss from his/her baseline which was documented above under the "Functional History" and "Functional Status" headings.  Judging by the patient's diagnosis, physical exam, and functional history, the patient has potential for functional progress which will result in measurable gains while on inpatient rehab.  These gains will be of substantial and practical use upon discharge  in facilitating mobility and self-care at the household level. 6. Physiatrist will provide 24 hour management of medical needs as well as oversight of the therapy plan/treatment and provide guidance as appropriate regarding the interaction of the two. 7. 24 hour rehab nursing will assist with safety, disease management and patient education  and help integrate  therapy concepts, techniques,education, etc. 8. PT will assess and treat for/with: Lower extremity strength, range of motion, stamina, balance, functional mobility, safety, adaptive techniques and equipment, coping skills, pain control, stroke education.   Goals are: Supervision/Min A. 9. OT will assess and treat for/with: ADL's, functional mobility, safety, upper extremity strength, adaptive techniques and equipment, ego support, and community reintegration.   Goals are: Supervision/Min A. Therapy may proceed with showering this patient. 10. Case Management and Social Worker will assess and treat for psychological issues and discharge planning. 11. Team  conference will be held weekly to assess progress toward goals and to determine barriers to discharge. 12. Patient will receive at least 3 hours of therapy per day at least 5 days per week. 13. ELOS: 12-17 days.       14. Prognosis:  good  Delice Lesch, MD, ABPMR Lavon Paganini Angiulli, PA-C 04/06/2018

## 2018-04-06 NOTE — PMR Pre-admission (Addendum)
PMR Admission Coordinator Pre-Admission Assessment  Patient: Anthony Hayes is an 83 y.o., male MRN: [id number] DOB: [birth year] Height: 5' 5"  (165.1 cm) Weight: 79.4 kg (175 lb)              Insurance Information HMO:     PPO:      PCP:      IPA:      80/20:      OTHER:  PRIMARY: Medicare A & B      Policy#: [id number]      Subscriber: Self CM Name:       Phone#:      Fax#:  Pre-Cert#: Eligible       Employer: Retired  Benefits:  Phone #: Verified online     Name: [employer]  Eff. Date: 06/27/02     Deduct: $1364      Out of Pocket Max: N/A      Life Max: N/A CIR: 100%      SNF: 100% days 1-20; 80% days 21-100 Outpatient: 80%     Co-Pay: 20% Home Health: 100%      Co-Pay: $0 DME: 80%     Co-Pay: 20% Providers: Patient's Choice   SECONDARY: Roseanna Rainbow       Policy#: [id number]      Subscriber: Self CM Name:       Phone#:      Fax#:  Pre-Cert#:       Employer: Retired Benefits:  Phone #: [phone]     Name:  Eff. Date:      Deduct:       Out of Pocket Max:       Life Max:  CIR:       SNF:  Outpatient:      Co-Pay:  Home Health:       Co-Pay:  DME:      Co-Pay:   Medicaid Application Date:       Case Manager:  Disability Application Date:       Case Worker:   Emergency Contact Information Contact Information    Name Relation Home Work Mobile   Philpott,Anita Daughter   [phone]   Seate,Whitney Daughter   [phone]     Current Medical History  Patient Admitting Diagnosis: Right basal ganglia infarct  History of Present Illness: Mr. Levoy Geisen is an 83 year old right-handed male with history of mild dementia, previous CVA, BPH, CKD stage III, multiple colon resections due to Crohn's disease.  He is a resident of [facility] assisted [facility] facility.  He was able to complete his own bathing, dressing, and walks to the dining hall for his meals using a rolling walker.  He does not drive.  Presented 04/03/2018 with altered mental status.  There was reports of  dizziness.  Cranial CT scan showed no acute changes.  MRI showed small areas of acute infarction affecting the posterior right basal ganglia and radiating white matter tracts on the right.  No swelling or hemorrhage.  Extensive chronic small vessel ischemic changes.  Patient did not receive TPA.  Carotid Dopplers with no ICA stenosis.  Echocardiogram with ejection fraction of 60% no wall motion abnormalities.  No defect or PFO noted.  Maintained on aspirin as well as Plavix for CVA prophylaxis.  Subcutaneous heparin for DVT prophylaxis.  A follow-up chest x-ray 04/05/2018 for cough showed mild bibasilar subsegmental atelectasis.  Tolerating a regular diet.  On 04/05/2018 noted some increased lethargy a follow-up cranial CT scan was completed as well as EEG  that showed no seizure and scan showed expected evolution of acute subacute infarcts involving the right corona radiata and basal ganglia.  No hemorrhage or other acute abnormality.  Patient with elevated WBCs at 20,600 as well as creatinine mildly elevated 1.33, 04/05/2018.  A follow-up urinalysis study positive nitrite with large leukocytes 04/05/2018 with culture pending and placed on empiric Rocephin and repeat WBC of 13,700 as well as creatinine 1.20.  Physical and occupational therapy evaluations completed with recommendations of physical medicine rehab consult.  Patient was admitted for a comprehensive rehab program 04/06/18.  NIH Total: 6    Past Medical History  Past Medical History:  Diagnosis Date  . Arthritis    "joints" (10/15/2017)  . Benign prostatic hyperplasia   . Complication of anesthesia    "last OR in 06/2017 affected him cognitively; hasn't got back to baseline yet" (10/15/2017)  . Dementia   . Depression   . GERD (gastroesophageal reflux disease)   . High cholesterol   . Hypertension   . Hypothyroidism   . Renal disease   . Renal disorder   . TIA (transient ischemic attack) early 2000s    Family History  family history is not  on file.  Prior Rehab/Hospitalizations:  Has the patient had major surgery during 100 days prior to admission? No  Current Medications   Current Facility-Administered Medications:  .  0.9 %  sodium chloride infusion, , Intravenous, Continuous, Rosalin Hawking, MD, Last Rate: 50 mL/hr at 04/05/18 1653 .  acetaminophen (TYLENOL) tablet 650 mg, 650 mg, Oral, Q4H PRN **OR** acetaminophen (TYLENOL) solution 650 mg, 650 mg, Per Tube, Q4H PRN **OR** acetaminophen (TYLENOL) suppository 650 mg, 650 mg, Rectal, Q4H PRN, Jonelle Sidle, Mohammad L, MD .  aspirin EC tablet 81 mg, 81 mg, Oral, Daily, Biby, Sharon L, NP, 81 mg at 04/05/18 0928 .  cefTRIAXone (ROCEPHIN) 1 g in sodium chloride 0.9 % 100 mL IVPB, 1 g, Intravenous, Q24H, Dhungel, Nishant, MD, Stopped at 04/05/18 2136 .  clopidogrel (PLAVIX) tablet 75 mg, 75 mg, Oral, Daily, Dhungel, Nishant, MD, 75 mg at 04/05/18 0929 .  guaiFENesin-dextromethorphan (ROBITUSSIN DM) 100-10 MG/5ML syrup 10 mL, 10 mL, Oral, QID PRN, Jonelle Sidle, Mohammad L, MD .  heparin injection 5,000 Units, 5,000 Units, Subcutaneous, Q8H, Elwyn Reach, MD, 5,000 Units at 04/06/18 0644 .  hydrochlorothiazide (HYDRODIURIL) tablet 25 mg, 25 mg, Oral, Daily, Gala Romney L, MD, 25 mg at 04/05/18 0928 .  imipramine (TOFRANIL) tablet 25 mg, 25 mg, Oral, QHS, Garba, Mohammad L, MD, 25 mg at 04/05/18 2132 .  ipratropium (ATROVENT) 0.06 % nasal spray 2 spray, 2 spray, Each Nare, QID, Elwyn Reach, MD, 2 spray at 04/05/18 2137 .  levothyroxine (SYNTHROID, LEVOTHROID) tablet 25 mcg, 25 mcg, Oral, QAC breakfast, Elwyn Reach, MD, 25 mcg at 04/06/18 0644 .  loratadine (CLARITIN) tablet 10 mg, 10 mg, Oral, Daily, Gala Romney L, MD, 10 mg at 04/05/18 0928 .  oxybutynin (DITROPAN) tablet 5 mg, 5 mg, Oral, Daily, Gala Romney L, MD, 5 mg at 04/05/18 0928 .  pantoprazole (PROTONIX) EC tablet 40 mg, 40 mg, Oral, BID, Gala Romney L, MD, 40 mg at 04/05/18 2132 .  potassium chloride SA  (K-DUR,KLOR-CON) CR tablet 40 mEq, 40 mEq, Oral, Once, Dhungel, Nishant, MD .  senna-docusate (Senokot-S) tablet 1 tablet, 1 tablet, Oral, QHS PRN, Jonelle Sidle, Mohammad L, MD .  tamsulosin (FLOMAX) capsule 0.4 mg, 0.4 mg, Oral, Daily, Jonelle Sidle, Mohammad L, MD, 0.4 mg at 04/05/18 0929 .  vitamin  B-12 (CYANOCOBALAMIN) tablet 500 mcg, 500 mcg, Oral, Daily, Biby, Sharon L, NP, 500 mcg at 04/05/18 1610  Patients Current Diet: Fall precautions Diet Heart Room service appropriate? Yes; Fluid consistency: Thin  Precautions / Restrictions Precautions Precautions: Fall Precaution Comments: L sided weakness and sensation defcitis Restrictions Weight Bearing Restrictions: No   Has the patient had 2 or more falls or a fall with injury in the past year?No  Prior Activity Level Limited Community (1-2x/wk): Prior to admission patient was fully independent in his ALF apartment except for medication management. He walked to meals with his walker and was active in his community there.   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 Walker used intermittently in apartment and consistently when out of the apartment   Prior Functional Level Prior Function Level of Independence: Independent with assistive device(s) Comments: uses rollator and completes own dressing and bathing. Walks to meals  Self Care: Did the patient need help bathing, dressing, using the toilet or eating? Independent  Indoor Mobility: Did the patient need assistance with walking from room to room (with or without device)? Independent  Stairs: Did the patient need assistance with internal or external stairs (with or without device)? Independent  Functional Cognition: Did the patient need help planning regular tasks such as shopping or remembering to take medications? Dependent  Current Functional Level Cognition   Arousal/Alertness: Awake/alert Overall Cognitive Status: Impaired/Different from baseline Current Attention Level: Focused Orientation Level: Disoriented to situation, Oriented to person, Oriented to place, Disoriented to time Following Commands: Follows one step commands inconsistently, Follows one step commands with increased time General Comments: Pt breathing heavy and closing eyes throughout session. Pt very fatigued but agreeable to participate in therapy. Attention: Sustained Sustained Attention: Appears intact Memory: Impaired Memory Impairment: Retrieval deficit Awareness: Impaired Awareness Impairment: Intellectual impairment, Anticipatory impairment Problem Solving: Impaired Problem Solving Impairment: Functional complex Safety/Judgment: Impaired Comments: per daughter, patient attempting to get up on own    Extremity Assessment (includes Sensation/Coordination)  Upper Extremity Assessment: LUE deficits/detail LUE Deficits / Details: Weak grasp strength and unable to bring hand to mouth. Pt with decreased finger opposition. Able to touch thumb to 1-4 digits with increased time  Lower Extremity Assessment: Defer to PT evaluation    ADLs  Overall ADL's : Needs assistance/impaired Eating/Feeding: Moderate assistance, Sitting Grooming: Wash/dry face, Minimal assistance, Sitting Grooming Details (indicate cue type and reason): Pt washign his face with increased time while sitting in recliner. Pt using RUE only Upper Body Bathing: Moderate assistance, Sitting Lower Body Bathing: Maximal assistance, Sit to/from stand Upper Body Dressing : Moderate assistance, Sitting Lower Body Dressing: Maximal assistance, Sit to/from stand Lower Body Dressing Details (indicate cue type and reason): Pt unable to lean forward to adjust socks Toilet Transfer: Moderate assistance, +2 for physical assistance Toilet Transfer Details (indicate cue type and reason): Pt performing sit<>stand only with  Mod A +2 for stability and to power into standing Functional mobility during ADLs: Moderate assistance, +2 for physical assistance(sit<>stand only) General ADL Comments: Pt lethargic and fatigued. Continue to be motivated to participate in therapy despite fatigue.     Mobility  Overal bed mobility: Needs Assistance Bed Mobility: Sit to Supine Supine to sit: Min assist Sit to supine: +2 for physical assistance, Max assist General bed mobility comments: In recliner upon arrival    Transfers  Overall transfer level: Needs assistance Equipment used: None  Transfers: Sit to/from Stand, [employer] Transfers Sit to Stand: Mod assist, +2 physical assistance Stand pivot transfers: Max assist General transfer comment: Mod A for power up into standing with +2 for safety in standing    Ambulation / Gait / Stairs / Wheelchair Mobility  Ambulation/Gait Ambulation/Gait assistance: Museum/gallery curator (Feet): 120 Feet Assistive device: 4-wheeled walker Gait Pattern/deviations: Step-through pattern, Decreased stride length General Gait Details: Deferred due to lethargy and change in status. Gait velocity: slow Gait velocity interpretation: Below normal speed for age/gender    Posture / Balance Dynamic Sitting Balance Sitting balance - Comments: pt with L lateral lean Balance Overall balance assessment: Needs assistance Sitting-balance support: Feet supported, No upper extremity supported Sitting balance-[name] Scale: Fair Sitting balance - Comments: pt with L lateral lean Standing balance support: During functional activity Standing balance-[name] Scale: Poor Standing balance comment: Not able to stand without external support with bil knee flexion.     Special needs/care consideration BiPAP/CPAP: No CPM: No Continuous Drip IV: No Dialysis: No        Life Vest: No Oxygen: No Special Bed: No Trach Size: No Wound Vac (area): No       Skin: Dry, Stage 1 pressure ulcer on sacrum                                 Bowel mgmt: Continent, last BM 04/05/18; PTA urgency and intermittent incontinence due to Crohn's disease and wore depends  Bladder mgmt: Incontinent with external catheter placed; PTA urgency and intermittent incontinence and wore depends   Diabetic mgmt: HgbA1c: 5.4     Previous Home Environment Type of Home: Assisted living Additional Comments: Resident at [facility]. All PLOF and home set up confirmed by daughter, who is a [place] in [place].   Discharge Living Setting Plans for Discharge Living Setting: Patient's home, Apartment, Other (Comment)(ALF) Type of Home at Discharge: [place] Name at Discharge: [facility]  Discharge Home Layout: One level Discharge Home Access: Level entry Discharge Bathroom Shower/Tub: Walk-in shower Discharge Bathroom Toilet: Handicapped height Discharge Bathroom Accessibility: Yes How Accessible: Accessible via walker Does the patient have any problems obtaining your medications?: No  Social/Family/Support Systems Patient Roles: Parent Contact Information: Daughter Whitney primary contact  Anticipated Caregiver: ALF Ability/Limitations of Caregiver: Daughters both work full time  Careers adviser: Intermittent Discharge Plan Discussed with Primary Caregiver: Yes Is Caregiver In Agreement with Plan?: Yes Does Caregiver/Family have Issues with Lodging/Transportation while Pt is in Rehab?: No  Goals/Additional Needs Patient/Family Goal for Rehab: PT/OT: Mod I; SLP: Mod I-Supervision  Expected length of stay: 7-10days  Cultural Considerations: Baptist  Dietary Needs: Heart Healthy diet restrictions  Equipment Needs: TBD Special Service Needs: can add on services in ALF if needed  Additional Information: Patient is a retired Software engineer  Pt/Family Agrees to Admission and willing to participate: Yes Program Orientation Provided & Reviewed with Pt/Caregiver Including Roles  &  Responsibilities: Yes  Decrease burden of Care through IP rehab admission: No  Possible need for SNF placement upon discharge: Potentially  Patient Condition: This patient's medical and functional status has changed since the consult dated: 04/04/18 at 12:52pm in which the Rehabilitation Physician determined and documented that the patient's condition is appropriate for intensive rehabilitative care in an inpatient rehabilitation facility. See "History of Present Illness" (above) for medical update. Functional changes are: Mod A 3 feet. Patient's medical and functional  status update has been discussed with the Rehabilitation physician and patient remains appropriate for inpatient rehabilitation. Will admit to inpatient rehab today.  Preadmission Screen Completed By:  Gunnar Fusi, 04/06/2018 9:34 AM ______________________________________________________________________   Discussed status with Dr. Posey Pronto on 04/06/18 at 1235 and received telephone approval for admission today.  Admission Coordinator:  Gunnar Fusi, time 1235/Date 04/06/18

## 2018-04-06 NOTE — Progress Notes (Addendum)
Inpatient Rehabilitation  Received notification from acute MD that patient was medically ready for IP Rehab.  I have notified patient and called daughter, [name].  Await call back to go over admission paperwork.  Plan to update team after we speak.  Call if questions.   Update: Whitney and patient ok to proceed with IP Rehab admission.  Updated team.  Call if questions.   Carmelia Roller., CCC/SLP Admission Coordinator  [facility]  Cell [phone]

## 2018-04-06 NOTE — Plan of Care (Signed)
  Problem: Education: Goal: Knowledge of disease or condition will improve Outcome: Completed/Met Goal: Knowledge of secondary prevention will improve Outcome: Completed/Met Goal: Knowledge of patient specific risk factors addressed and post discharge goals established will improve Outcome: Completed/Met   Problem: Coping: Goal: Will verbalize positive feelings about self Outcome: Completed/Met Goal: Will identify appropriate support needs Outcome: Completed/Met   Problem: Health Behavior/Discharge Planning: Goal: Ability to manage health-related needs will improve Outcome: Completed/Met   Problem: Self-Care: Goal: Ability to participate in self-care as condition permits will improve Outcome: Completed/Met Goal: Verbalization of feelings and concerns over difficulty with self-care will improve Outcome: Completed/Met Goal: Ability to communicate needs accurately will improve Outcome: Completed/Met   Problem: Nutrition: Goal: Risk of aspiration will decrease Outcome: Completed/Met Goal: Dietary intake will improve Outcome: Completed/Met   Problem: Ischemic Stroke/TIA Tissue Perfusion: Goal: Complications of ischemic stroke/TIA will be minimized Outcome: Completed/Met   Problem: Education: Goal: Knowledge of General Education information will improve Outcome: Completed/Met   Problem: Health Behavior/Discharge Planning: Goal: Ability to manage health-related needs will improve Outcome: Completed/Met   Problem: Clinical Measurements: Goal: Ability to maintain clinical measurements within normal limits will improve Outcome: Completed/Met Goal: Will remain free from infection Outcome: Completed/Met Goal: Diagnostic test results will improve Outcome: Completed/Met Goal: Respiratory complications will improve Outcome: Completed/Met Goal: Cardiovascular complication will be avoided Outcome: Completed/Met   Problem: Activity: Goal: Risk for activity intolerance will  decrease Outcome: Completed/Met   Problem: Nutrition: Goal: Adequate nutrition will be maintained Outcome: Completed/Met   Problem: Coping: Goal: Level of anxiety will decrease Outcome: Completed/Met   Problem: Elimination: Goal: Will not experience complications related to bowel motility Outcome: Completed/Met Goal: Will not experience complications related to urinary retention Outcome: Completed/Met   Problem: Pain Managment: Goal: General experience of comfort will improve Outcome: Completed/Met   Problem: Safety: Goal: Ability to remain free from injury will improve Outcome: Completed/Met   Problem: Skin Integrity: Goal: Risk for impaired skin integrity will decrease Outcome: Completed/Met

## 2018-04-06 NOTE — Progress Notes (Signed)
Physical Therapy Treatment Patient Details Name: Anthony Hayes MRN: [id number] DOB: 09/10/1935 Today's Date: 04/06/2018    History of Present Illness Anthony Hayes is a 83 y.o. male with PMH significant of BPH, dementia, previous CVA, multiple colon resections due to Crohn's disease as a child with poor absorption of nutrients and OA who is a resident at [facility] brought in by daughter after she got a call that he was acting differently. Head CT was negative but MRI showed an infarct of R basal ganglia. Repeat Head CT-unremarkable.    PT Comments    Patient more alert today with better attention than yesterday. Does not recall events of yesterday at all and not able to state which hospital he was at or where he lives. Demonstrates incoordination of LUE/LE and requires Mod A to take a few steps to get to the chair today. Able to follow 1 step commands with increased time. Pt with slow processing and requires repetition of cues. Continue to recommend CIR. Will follow.    Follow Up Recommendations  CIR;Supervision/Assistance - 24 hour     Equipment Recommendations  None recommended by PT    Recommendations for Other Services       Precautions / Restrictions Precautions Precautions: Fall Precaution Comments: L sided weakness and sensation defcitis Restrictions Weight Bearing Restrictions: No    Mobility  Bed Mobility Overal bed mobility: Needs Assistance Bed Mobility: Rolling;Sidelying to Sit Rolling: Min guard Sidelying to sit: HOB elevated;Min assist       General bed mobility comments: Cues for sequencing and technique, use of LUE to grab rail and assist to elevate trunk. Increased time.   Transfers Overall transfer level: Needs assistance Equipment used: 4-wheeled walker Transfers: Sit to/from Stand Sit to Stand: Min assist         General transfer comment: Assist to power to standing from EOB, with bil knee flexion and flexed trunk. Assist to place LUE on  rollator handle.   Ambulation/Gait Ambulation/Gait assistance: Mod assist Ambulation Distance (Feet): 3 Feet Assistive device: 4-wheeled walker Gait Pattern/deviations: Step-to pattern;Decreased step length - left;Trunk flexed     General Gait Details: Able to take a few steps to get to chair with difficulty advancing/sequencing LLE and left knee instability noted. Assist with rollator navigation for turn and to maintain Left hand on walker handle.   Stairs            Wheelchair Mobility    Modified Rankin (Stroke Patients Only) Modified Rankin (Stroke Patients Only) Pre-Morbid Rankin Score: Moderate disability Modified Rankin: Moderately severe disability     Balance Overall balance assessment: Needs assistance Sitting-balance support: Feet supported;No upper extremity supported Sitting balance-[name] Scale: Fair Sitting balance - Comments: assist to donn socks sitting EOB. left lateral lean. Postural control: Left lateral lean Standing balance support: During functional activity Standing balance-[name] Scale: Poor Standing balance comment: Requires assist to stand and BUE support.                             Cognition Arousal/Alertness: Awake/alert Behavior During Therapy: Flat affect Overall Cognitive Status: Impaired/Different from baseline Area of Impairment: Orientation                 Orientation Level: Disoriented to;Place;Time Current Attention Level: Sustained   Following Commands: Follows one step commands with increased time     Problem Solving: Slow processing;Requires verbal cues;Difficulty sequencing General Comments: Slow processing noted today. Does not recall  yesterday. Cannot state where he is located today but knows he is in a hospital. does not know where he lives. Asked about [place] and he said no (which is where he came from). Does not know the year.       Exercises      General Comments        Pertinent  Vitals/Pain Pain Assessment: No/denies pain    Home Living                      Prior Function            PT Goals (current goals can now be found in the care plan section) Progress towards PT goals: Progressing toward goals(slowly)    Frequency    Min 4X/week      PT Plan Current plan remains appropriate    Co-evaluation              AM-PAC PT "6 Clicks" Daily Activity  Outcome Measure  Difficulty turning over in bed (including adjusting bedclothes, sheets and blankets)?: None Difficulty moving from lying on back to sitting on the side of the bed? : Unable Difficulty sitting down on and standing up from a chair with arms (e.g., wheelchair, bedside commode, etc,.)?: Unable Help needed moving to and from a bed to chair (including a wheelchair)?: A Lot Help needed walking in hospital room?: A Lot Help needed climbing 3-5 steps with a railing? : Total 6 Click Score: 11    End of Session Equipment Utilized During Treatment: Gait belt Activity Tolerance: Patient tolerated treatment well Patient left: in chair;with call bell/phone within reach;with chair alarm set Nurse Communication: Mobility status PT Visit Diagnosis: Unsteadiness on feet (R26.81);Difficulty in walking, not elsewhere classified (R26.2)     Time: 1282-0813 PT Time Calculation (min) (ACUTE ONLY): 27 min  Charges:  $Therapeutic Activity: 23-37 mins                    G Codes:       Anthony Hayes, PT, DPT [phone]     Marguarite Arbour A Britanee Vanblarcom 04/06/2018, 12:02 PM

## 2018-04-06 NOTE — Progress Notes (Signed)
Patient  Transferring to CIR, report called to [name]. Family aware of transfer plan.  Kayna Suppa, Tivis Ringer, RN

## 2018-04-06 NOTE — Progress Notes (Signed)
PROGRESS NOTE                                                                                                                                                                                                             Patient Demographics:    Anthony Hayes, is a 83 y.o. male, DOB - 25-Jan-1935, KNL:[id number]  Admit date - 04/03/2018   Admitting Physician Elwyn Reach, MD  Outpatient Primary MD for the patient is System, Pcp Not In  LOS - 3   Chief Complaint  Patient presents with  . Extremity Weakness       Brief Narrative   See discharge summary for detail.  83 year old male with prior CVA presenting with altered mental status and dizziness with finding of right posterior basal ganglia stroke.    Subjective:   Patient is more alert today but still not participating much during conversation or therapy.  No overnight events.  Denies any dysuria.  Discharge to CIR held due to change in mental status.   Assessment  & Plan :   Acute ischemic stroke Posterior right basal ganglia infarct.  3 weeks fo DAPT followed by single agent ( plavix) thereafter.  On pravastatin. Discharge to SNF.  Acute metabolic encephalopathy due to UTI Repeat head CT and EEG unremarkable.  Started on empiric Rocephin for positive UA and significant leukocytosis which improved today.  Follow cultures.  Mental status slightly improved.  Hypokalemia Replenished   Rest of the plan as outlined on discharge summary.  Discussed with daughter on the phone.   Lab Results  Component Value Date   PLT 306 04/06/2018    Antibiotics  :   Anti-infectives (From admission, onward)   Start     Dose/Rate Route Frequency Ordered Stop   04/06/18 0000  cefTRIAXone 1 g in sodium chloride 0.9 % 100 mL     1 g 200 mL/hr over 30 Minutes Intravenous Every 24 hours 04/06/18 1018     04/05/18 1800  cefTRIAXone (ROCEPHIN) 1 g in sodium chloride 0.9 % 100 mL  IVPB     1 g 200 mL/hr over 30 Minutes Intravenous Every 24 hours 04/05/18 1743          Objective:   Vitals:   04/05/18 2010 04/05/18 2353 04/06/18 0411 04/06/18 0846  BP: 140/66 139/78 (!) 151/80 (!) 148/78  Pulse: 64 64 66 68  Resp: 18 18 18 18   Temp: 98.4 F (36.9 C) 98.6 F (37 C) 98.2 F (36.8 C) 98.1 F (36.7 C)  TempSrc: Oral Oral Oral Oral  SpO2: 96% 94% 97% 98%  Weight:      Height:        Wt Readings from Last 3 Encounters:  04/03/18 79.4 kg (175 lb)  11/09/17 75.3 kg (166 lb)  10/22/17 68.9 kg (152 lb)     Intake/Output Summary (Last 24 hours) at 04/06/2018 1024 Last data filed at 04/06/2018 6962 Gross per 24 hour  Intake 1105 ml  Output 1450 ml  Net -345 ml     Physical Exam  Gen: not in distress, fatigued HEENT:moist mucosa, supple neck Chest: clear b/l, no added sounds CVS: N S1&S2, no murmurs GI: soft, NT, ND,  Musculoskeletal: warm, no edema CNS: AAOX2-3 (communicates poorly), left extremity weakness (unchanged)    Data Review:    CBC Recent Labs  Lab 04/03/18 1019 04/03/18 1028 04/03/18 2337 04/04/18 0608 04/05/18 1352 04/06/18 0441  WBC 7.7  --  7.6 7.3 20.6* 13.7*  HGB 13.4 13.9 12.4* 12.4* 13.8 12.6*  HCT 40.1 41.0 37.0* 37.2* 41.0 37.2*  PLT 354  --  329 332 323 306  MCV 87.6  --  87.7 86.9 87.4 86.9  MCH 29.3  --  29.4 29.0 29.4 29.4  MCHC 33.4  --  33.5 33.3 33.7 33.9  RDW 14.8  --  15.1 14.7 14.8 14.9  LYMPHSABS 1.6  --   --   --   --   --   MONOABS 0.7  --   --   --   --   --   EOSABS 0.5  --   --   --   --   --   BASOSABS 0.0  --   --   --   --   --     Chemistries  Recent Labs  Lab 04/03/18 1019 04/03/18 1028 04/03/18 2337 04/04/18 0608 04/05/18 1352 04/06/18 0441  NA 136 139  --  137 133* 134*  K 4.0 4.1  --  3.8 3.8 3.2*  CL 102 102  --  105 97* 99*  CO2 24  --   --  21* 22 22  GLUCOSE 87 87  --  109* 105* 102*  BUN 21* 22*  --  21* 17 17  CREATININE 1.22 1.30* 1.24 1.09 1.33* 1.20  CALCIUM 9.4   --   --  8.7* 9.3 8.7*  AST 28  --   --  24  --   --   ALT 17  --   --  17  --   --   ALKPHOS 129*  --   --  110  --   --   BILITOT 0.5  --   --  0.6  --   --    ------------------------------------------------------------------------------------------------------------------ Recent Labs    04/04/18 0608  CHOL 155  HDL 30*  LDLCALC 70  TRIG 276*  CHOLHDL 5.2    Lab Results  Component Value Date   HGBA1C 5.4 04/04/2018   ------------------------------------------------------------------------------------------------------------------ Recent Labs    04/04/18 0608  TSH 4.601*   ------------------------------------------------------------------------------------------------------------------ Recent Labs    04/03/18 2322  VITAMINB12 295    Coagulation profile Recent Labs  Lab 04/03/18 1019  INR 1.05    No results for input(s): DDIMER in the last 72 hours.  Cardiac Enzymes Recent Labs  Lab 04/03/18 2337  TROPONINI <0.03   ------------------------------------------------------------------------------------------------------------------ No results found for: BNP  Inpatient Medications  Scheduled Meds: . aspirin EC  81 mg Oral Daily  . clopidogrel  75 mg Oral Daily  . heparin  5,000 Units Subcutaneous Q8H  . hydrochlorothiazide  25 mg Oral Daily  . imipramine  25 mg Oral QHS  . ipratropium  2 spray Each Nare QID  . levothyroxine  25 mcg Oral QAC breakfast  . loratadine  10 mg Oral Daily  . oxybutynin  5 mg Oral Daily  . pantoprazole  40 mg Oral BID  . potassium chloride  40 mEq Oral Once  . tamsulosin  0.4 mg Oral Daily  . vitamin B-12  500 mcg Oral Daily   Continuous Infusions: . sodium chloride 50 mL/hr at 04/05/18 1653  . cefTRIAXone (ROCEPHIN)  IV Stopped (04/05/18 2136)   PRN Meds:.acetaminophen **OR** acetaminophen (TYLENOL) oral liquid 160 mg/5 mL **OR** acetaminophen, guaiFENesin-dextromethorphan, senna-docusate  Micro Results Recent Results  (from the past 240 hour(s))  Urine culture     Status: None   Collection Time: 04/03/18  1:57 PM  Result Value Ref Range Status   Specimen Description URINE, CATHETERIZED  Final   Special Requests NONE  Final   Culture   Final    NO GROWTH Performed at [facility], [place] [address] [place], [place] [address]    Report Status 04/04/2018 FINAL  Final  MRSA PCR Screening     Status: None   Collection Time: 04/04/18  3:13 AM  Result Value Ref Range Status   MRSA by PCR NEGATIVE NEGATIVE Final    Comment:        The GeneXpert MRSA Assay (FDA approved for NASAL specimens only), is one component of a comprehensive MRSA colonization surveillance program. It is not intended to diagnose MRSA infection nor to guide or monitor treatment for MRSA infections. Performed at [facility], [place] [address] [place], [place] [address]     Radiology Reports Dg Chest 2 View  Result Date: 04/03/2018 CLINICAL DATA:  Bilateral arm weakness. EXAM: CHEST - 2 VIEW COMPARISON:  February 02, 2018 FINDINGS: The heart, hila, and mediastinum are normal. No pulmonary nodules or masses. No focal infiltrates. IMPRESSION: No active cardiopulmonary disease. Electronically Signed   By: Dorise Bullion III M.D   On: 04/03/2018 12:53   Ct Head Wo Contrast  Result Date: 04/05/2018 CLINICAL DATA:  Stroke, follow-up. EXAM: CT HEAD WITHOUT CONTRAST TECHNIQUE: Contiguous axial images were obtained from the base of the skull through the vertex without intravenous contrast. COMPARISON:  MRI and CT of the head 04/03/2018. FINDINGS: Brain: The study is mildly degraded by patient motion. Focal infarct involving the right corona radiata and basal ganglia is noted. Moderate diffuse white matter changes also present. White matter changes within the internal capsule are again noted bilaterally. No acute hemorrhage is present. The brainstem demonstrates white matter disease. The cerebellum is unremarkable. Vascular:  Atherosclerotic calcifications are present within the cavernous internal carotid arteries bilaterally. There is no hyperdense vessel. Skull: Calvarium is intact. No focal lytic or blastic lesions are present. Sinuses/Orbits: The paranasal sinuses and mastoid air cells are clear. Bilateral lens replacements are present. Globes and orbits are within normal limits. IMPRESSION: 1. Expected evolution acute/subacute infarcts involving the right corona radiata and basal ganglia. 2. No hemorrhage or other acute abnormality. 3. Stable diffuse white matter disease. Electronically Signed   By: [name] M.D.   On: 04/05/2018 16:40   Ct  Head Wo Contrast  Result Date: 04/03/2018 CLINICAL DATA:  Pt has weakness in all extremities and problem getting his spoon or fork to his mouth EXAM: CT HEAD WITHOUT CONTRAST TECHNIQUE: Contiguous axial images were obtained from the base of the skull through the vertex without intravenous contrast. COMPARISON:  10/18/2017 FINDINGS: Brain: No evidence of acute infarction, hemorrhage, hydrocephalus, extra-axial collection or mass lesion/mass effect. There is ventricular sulcal enlargement reflecting mild diffuse atrophy. Patchy white matter hypoattenuation is also noted consistent with moderate chronic microvascular ischemic change. Vascular: No hyperdense vessel or unexpected calcification. Skull: Normal. Negative for fracture or focal lesion. Sinuses/Orbits: Globes and orbits are unremarkable. Visualized sinuses and mastoid air cells are clear. Other: None. IMPRESSION: 1. No acute intracranial abnormalities. 2. Atrophy and chronic microvascular ischemic change. Stable appearance from the prior study. Electronically Signed   By: Lajean Manes M.D.   On: 04/03/2018 12:37   Mr Brain Wo Contrast (neuro Protocol)  Result Date: 04/03/2018 CLINICAL DATA:  Weakness in the arms and legs today. EXAM: MRI HEAD WITHOUT CONTRAST TECHNIQUE: Multiplanar, multiecho pulse sequences of the brain  and surrounding structures were obtained without intravenous contrast. COMPARISON:  CT 04/03/2018 FINDINGS: Brain: Diffusion imaging shows areas of acute infarction on the right affecting the posterior basal ganglia and radiating white matter tracts. No cortical or large vessel territory infarction. There chronic small-vessel ischemic changes throughout the pons. No focal cerebellar insult. There is an old lacunar infarction in the left thalamus. Cerebral hemispheres elsewhere show chronic small-vessel ischemic changes affecting the basal ganglia and throughout the cerebral hemispheric white matter. Few punctate foci of hemosiderin deposition associated with some of the old strokes. No sign of mass lesion, acute hemorrhage, hydrocephalus or extra-axial collection. Vascular: Major vessels at the base of the brain show flow. Skull and upper cervical spine: Negative Sinuses/Orbits: Clear/normal Other: None IMPRESSION: Small areas of acute infarction affecting the posterior right basal ganglia and radiating white matter tracts on the right. No swelling or hemorrhage. Extensive chronic small-vessel ischemic changes elsewhere throughout the brain as outlined above. Electronically Signed   By: Nelson Chimes M.D.   On: 04/03/2018 20:57   Dg Chest Port 1 View  Result Date: 04/06/2018 CLINICAL DATA:  Leukocytosis EXAM: PORTABLE CHEST 1 VIEW COMPARISON:  Chest x-rays dated 04/05/2018 and 04/03/2018. FINDINGS: Mild cardiomegaly is stable. Overall cardiomediastinal silhouette is stable. Lungs are clear. No pleural effusion or pneumothorax seen. No acute or suspicious osseous finding. IMPRESSION: No active disease. Electronically Signed   By: Franki Cabot M.D.   On: 04/06/2018 08:25   Dg Chest Port 1 View  Result Date: 04/05/2018 CLINICAL DATA:  Seven cough.  Shortness of breath. EXAM: PORTABLE CHEST 1 VIEW COMPARISON:  02/02/2018. FINDINGS: Mediastinum and hilar structures normal. Mild cardiomegaly with normal pulmonary  vascularity. Mild bibasilar subsegmental atelectasis and/or scarring. No pleural effusion or pneumothorax. Degenerative changes scoliosis thoracic spine. IMPRESSION: Mild bibasilar subsegmental atelectasis and/or scarring. Electronically Signed   By: Marcello Moores  Register   On: 04/05/2018 10:34    Time Spent in minutes  25   Anthony Hayes M.D on 04/06/2018 at 10:24 AM  Between 7am to 7pm - Pager - [phone]  After 7pm go to [email or web address] - password [facility]  [facility] -  Office  [phone]

## 2018-04-06 NOTE — Care Management Note (Signed)
Case Management Note  Patient Details  Name: Anthony Hayes MRN: [id number] Date of Birth: 09-15-1935  Subjective/Objective:    Pt admitted with CVA. He is from [facility].                 Action/Plan: Pt discharging to CIR today. CM signing off.   Expected Discharge Date:  04/06/18               Expected Discharge Plan:  [place]  In-House Referral:     Discharge planning Services  CM Consult  Post Acute Care Choice:    Choice offered to:     DME Arranged:    DME Agency:     HH Arranged:    HH Agency:     Status of Service:  Completed, signed off  If discussed at [employer], dates discussed:    Additional Comments:  Pollie Friar, RN 04/06/2018, 11:44 AM

## 2018-04-07 ENCOUNTER — Inpatient Hospital Stay (HOSPITAL_COMMUNITY): Payer: Medicare Other

## 2018-04-07 ENCOUNTER — Inpatient Hospital Stay (HOSPITAL_COMMUNITY): Payer: Medicare Other | Admitting: Occupational Therapy

## 2018-04-07 ENCOUNTER — Inpatient Hospital Stay (HOSPITAL_COMMUNITY): Payer: Medicare Other | Admitting: Speech Pathology

## 2018-04-07 ENCOUNTER — Inpatient Hospital Stay (HOSPITAL_COMMUNITY): Payer: Medicare Other | Admitting: Physical Therapy

## 2018-04-07 DIAGNOSIS — I69354 Hemiplegia and hemiparesis following cerebral infarction affecting left non-dominant side: Secondary | ICD-10-CM

## 2018-04-07 DIAGNOSIS — I639 Cerebral infarction, unspecified: Secondary | ICD-10-CM

## 2018-04-07 LAB — CBC WITH DIFFERENTIAL/PLATELET
BASOS PCT: 0 %
Basophils Absolute: 0 10*3/uL (ref 0.0–0.1)
Eosinophils Absolute: 0.4 10*3/uL (ref 0.0–0.7)
Eosinophils Relative: 3 %
HEMATOCRIT: 40.8 % (ref 39.0–52.0)
Hemoglobin: 13.7 g/dL (ref 13.0–17.0)
LYMPHS PCT: 12 %
Lymphs Abs: 1.5 10*3/uL (ref 0.7–4.0)
MCH: 29.8 pg (ref 26.0–34.0)
MCHC: 33.6 g/dL (ref 30.0–36.0)
MCV: 88.9 fL (ref 78.0–100.0)
MONO ABS: 1 10*3/uL (ref 0.1–1.0)
MONOS PCT: 8 %
NEUTROS ABS: 9.2 10*3/uL — AB (ref 1.7–7.7)
Neutrophils Relative %: 77 %
Platelets: 311 10*3/uL (ref 150–400)
RBC: 4.59 MIL/uL (ref 4.22–5.81)
RDW: 15.4 % (ref 11.5–15.5)
WBC: 12 10*3/uL — ABNORMAL HIGH (ref 4.0–10.5)

## 2018-04-07 LAB — COMPREHENSIVE METABOLIC PANEL
ALT: 15 U/L — ABNORMAL LOW (ref 17–63)
ANION GAP: 14 (ref 5–15)
AST: 23 U/L (ref 15–41)
Albumin: 3.7 g/dL (ref 3.5–5.0)
Alkaline Phosphatase: 106 U/L (ref 38–126)
BILIRUBIN TOTAL: 0.7 mg/dL (ref 0.3–1.2)
BUN: 19 mg/dL (ref 6–20)
CO2: 19 mmol/L — ABNORMAL LOW (ref 22–32)
Calcium: 9.4 mg/dL (ref 8.9–10.3)
Chloride: 101 mmol/L (ref 101–111)
Creatinine, Ser: 1.2 mg/dL (ref 0.61–1.24)
GFR calc Af Amer: >60 mL/min (ref >60)
GFR, EST NON AFRICAN AMERICAN: 55 mL/min — AB (ref >60)
Glucose, Bld: 112 mg/dL — ABNORMAL HIGH (ref 65–99)
POTASSIUM: 3.8 mmol/L (ref 3.5–5.1)
Sodium: 134 mmol/L — ABNORMAL LOW (ref 135–145)
TOTAL PROTEIN: 7.4 g/dL (ref 6.5–8.1)

## 2018-04-07 MED ORDER — POTASSIUM CHLORIDE CRYS ER 20 MEQ PO TBCR
20.0000 meq | EXTENDED_RELEASE_TABLET | Freq: Every day | ORAL | Status: DC
  Administered 2018-04-07 – 2018-04-17 (×11): 20 meq via ORAL
  Filled 2018-04-07 (×11): qty 1

## 2018-04-07 NOTE — Progress Notes (Signed)
Patient information reviewed and entered into eRehab system by Gahel Safley, RN, CRRN, PPS Coordinator.  Information including medical coding and functional independence measure will be reviewed and updated through discharge.     Per nursing patient was given "Data Collection Information Summary for Patients in Inpatient Rehabilitation Facilities with attached "Privacy Act Statement-Health Care Records" upon admission.  

## 2018-04-07 NOTE — Progress Notes (Signed)
Social Work  Social Work Assessment and Plan  Patient Details  Name: Anthony Hayes MRN: [id number] Date of Birth: 22-Mar-1935  Today's Date: 04/07/2018  Problem List:  Patient Active Problem List   Diagnosis Date Noted  . Infarction of right basal ganglia ([place]) 04/06/2018  . Pressure injury of skin 04/06/2018  . Vitamin B12 deficiency   . Hypothyroidism   . Crohn's disease with complication ([place])   . Benign prostatic hyperplasia   . Acute lower UTI   . Dementia without behavioral disturbance   . Acute ischemic stroke ([place]) 04/05/2018  . Leukocytosis   . Shortness of breath   . Acute encephalopathy   . CVA (cerebral vascular accident) ([place]) 04/03/2018  . Calculus, kidney 01/21/2018  . Acute blood loss anemia   . Mallory-Weiss syndrome   . Esophageal stricture   . Respiratory failure ([place])   . UGIB (upper gastrointestinal bleed) 10/16/2017  . GERD (gastroesophageal reflux disease) 10/16/2017  . Essential hypertension, benign 10/16/2017  . Chest pain 10/15/2017  . Hypothyroidism (acquired) 06/08/2017  . Chronic kidney disease, stage III (moderate) ([place]) 03/19/2016  . Dyslipidemia 03/19/2016  . Degeneration of lumbar or lumbosacral intervertebral disc 07/10/2008  . Arthropathy, lower leg 05/23/2004   Past Medical History:  Past Medical History:  Diagnosis Date  . Arthritis    "joints" (10/15/2017)  . Benign prostatic hyperplasia   . Complication of anesthesia    "last OR in 06/2017 affected him cognitively; hasn't got back to baseline yet" (10/15/2017)  . Dementia   . Depression   . GERD (gastroesophageal reflux disease)   . High cholesterol   . Hypertension   . Hypothyroidism   . Renal disease   . Renal disorder   . TIA (transient ischemic attack) early 2000s   Past Surgical History:  Past Surgical History:  Procedure Laterality Date  . ABDOMINAL EXPLORATION SURGERY  7/  . APPENDECTOMY    . CATARACT EXTRACTION W/ INTRAOCULAR LENS  IMPLANT, BILATERAL Bilateral   .  COLECTOMY  1950s   "thought he had iteilis"  . ESOPHAGOGASTRODUODENOSCOPY N/A 10/22/2017   Procedure: ESOPHAGOGASTRODUODENOSCOPY (EGD);  Surgeon: Gatha Mayer, MD;  Location: [facility] ENDOSCOPY;  Service: Endoscopy;  Laterality: N/A;  . ESOPHAGOGASTRODUODENOSCOPY (EGD) WITH PROPOFOL N/A 10/16/2017   Procedure: ESOPHAGOGASTRODUODENOSCOPY (EGD) WITH PROPOFOL;  Surgeon: Milus Banister, MD;  Location: [facility] ENDOSCOPY;  Service: Endoscopy;  Laterality: N/A;  . FEMUR FRACTURE SURGERY Right 1950s  . FRACTURE SURGERY    . KNEE ARTHROSCOPY Left   . POSTERIOR LUMBAR FUSION    . ROTATOR CUFF REPAIR Left   . TONSILLECTOMY     Social History:  reports that he quit smoking about 56 years ago. His smoking use included cigarettes. He quit after 7.00 years of use. He has never used smokeless tobacco. He reports that he drinks alcohol. He reports that he does not use drugs.  Family / Support Systems Marital Status: Widow/Widower Patient Roles: Parent Children: [name] Seate-daughter [phone]  Baldo Ash) Other Supports: [name] Philpott-daughter [phone] (local) Anticipated Caregiver: ALF Ability/Limitations of Caregiver: Daughter's both work full time-one is local and the other is in [place] Caregiver Availability: Intermittent Family Dynamics: Close knit with their Dad and want to make sure he is getting the care that he needs. They feel [facility] is providing the care and services he needs. Both are hopeful he will do well here and get back to the level he was before this stroke  Social History Preferred language: English Religion: Baptist Cultural Background: No  issues Education: Environmental consultant Read: Yes Write: Yes Employment Status: Retired Freight forwarder Issues: No issues Guardian/Conservator: None-according to MD pt is capable of making his own decisions while here.   Abuse/Neglect Abuse/Neglect Assessment Can Be Completed: Yes Physical Abuse:  Denies Verbal Abuse: Denies Sexual Abuse: Denies Exploitation of patient/patient's resources: Denies Self-Neglect: Denies  Emotional Status Pt's affect, behavior adn adjustment status: Pt is motivated to do well and recover from this stroke. He feels he is not moving as well and needs to get back to walking with his walker. He has a ways to walk to the dining room at the ALF and would like to get back to this. Daughter's also would like this to happen while he is here. Recent Psychosocial Issues: other health issues were managed until this happened Pyschiatric History: History of depression in the past feels he is doing ok may want to see neuro-psych while here for coping. Will ask input from team and make referral. Substance Abuse History: No issues  Patient / Family Perceptions, Expectations & Goals Pt/Family understanding of illness & functional limitations: Pt and daughter can explain his stroke and deficits. They have spoken with the MD and feel they have a good understanding of the treatment plan moving forward. [place] daughter visits every evening and provide support to pt along with daughter from [place] on the weekends. Premorbid pt/family roles/activities: Father, retiree, friend, grandfather, etc Anticipated changes in roles/activities/participation: resume Pt/family expectations/goals: Pt states: " I want to get back to where I was walking with my walker."  Whitney states: " We are hopeful he will do well here and be walking with his walker when he leaves here."  [employer]: Other (Comment)(Resident of [facility]-ALF) Premorbid Home Care/DME Agencies: Other (Comment)(has Rollator and cane) Transportation available at discharge: Facility and daughter's-pt was not driving Resource referrals recommended: Support group (specify)  Discharge Planning Living Arrangements: Other (Comment)(ALF) Support Systems: Children, Friends/neighbors Type of  Residence: Assisted living Insurance Resources: [employer], Multimedia programmer (specify)([employer]) Museum/gallery curator Resources: Fish farm manager, Other (Comment) Financial Screen Referred: No Living Expenses: Rent Money Management: Family Does the patient have any problems obtaining your medications?: No Home Management: facility does the home management Patient/Family Preliminary Plans: Return to [facility] upon discharge, depending upon levle of care daughter's may need to have other services for him. He does have to walk the length of a football field to the dining room, se eif can achieve this while here. Await team's evaluations. Social Work Anticipated Follow Up Needs: HH/OP, ALF/IL  Clinical Impression Pleasant gentleman who is motivated to do well and recover form this stroke. He wants to get back to walking the way he was prior to admission. He was active at the ALF. His daughter's are involved and supportive. Will await team's evaluations and work on coordination back to ALF. Ask team if feels would benefit from seeing neuro-psych while here.  [name] 04/07/2018, 10:28 AM

## 2018-04-07 NOTE — Evaluation (Signed)
vOccupational Therapy Assessment and Plan  Patient Details  Name: Anthony Hayes MRN: [id number] Date of Birth: 26-May-1935  OT Diagnosis: abnormal posture, apraxia, ataxia, cognitive deficits, hemiplegia affecting dominant side and muscle weakness (generalized) Rehab Potential: Rehab Potential (ACUTE ONLY): Fair ELOS: 2.5-3 weeks    Today's Date: 04/07/2018 OT Individual Time: 9201-0071 OT Individual Time Calculation (min): 60 min     Problem List:  Patient Active Problem List   Diagnosis Date Noted  . Infarction of right basal ganglia ([place]) 04/06/2018  . Pressure injury of skin 04/06/2018  . Vitamin B12 deficiency   . Hypothyroidism   . Crohn's disease with complication ([place])   . Benign prostatic hyperplasia   . Acute lower UTI   . Dementia without behavioral disturbance   . Acute ischemic stroke ([place]) 04/05/2018  . Leukocytosis   . Shortness of breath   . Acute encephalopathy   . CVA (cerebral vascular accident) ([place]) 04/03/2018  . Calculus, kidney 01/21/2018  . Acute blood loss anemia   . Mallory-Weiss syndrome   . Esophageal stricture   . Respiratory failure ([place])   . UGIB (upper gastrointestinal bleed) 10/16/2017  . GERD (gastroesophageal reflux disease) 10/16/2017  . Essential hypertension, benign 10/16/2017  . Chest pain 10/15/2017  . Hypothyroidism (acquired) 06/08/2017  . Chronic kidney disease, stage III (moderate) ([place]) 03/19/2016  . Dyslipidemia 03/19/2016  . Degeneration of lumbar or lumbosacral intervertebral disc 07/10/2008  . Arthropathy, lower leg 05/23/2004    Past Medical History:  Past Medical History:  Diagnosis Date  . Arthritis    "joints" (10/15/2017)  . Benign prostatic hyperplasia   . Complication of anesthesia    "last OR in 06/2017 affected him cognitively; hasn't got back to baseline yet" (10/15/2017)  . Dementia   . Depression   . GERD (gastroesophageal reflux disease)   . High cholesterol   . Hypertension   . Hypothyroidism   . Renal  disease   . Renal disorder   . TIA (transient ischemic attack) early 2000s   Past Surgical History:  Past Surgical History:  Procedure Laterality Date  . ABDOMINAL EXPLORATION SURGERY  7/  . APPENDECTOMY    . CATARACT EXTRACTION W/ INTRAOCULAR LENS  IMPLANT, BILATERAL Bilateral   . COLECTOMY  1950s   "thought he had iteilis"  . ESOPHAGOGASTRODUODENOSCOPY N/A 10/22/2017   Procedure: ESOPHAGOGASTRODUODENOSCOPY (EGD);  Surgeon: Gatha Mayer, MD;  Location[facility] END[facility]COPY;  Service: Endoscopy;  Laterality: N/A;  . ESOPHAGOGASTRODUODENOSCOPY (EGD) WITH PROPOFOL N/A 10/16/2017   Procedure: ESOPHAGOGASTRODUODENOSCOPY (EGD) WITH PROPOFOL;  Surgeon: Milus Banister, MD;  Location: [facility] ENDOSCOPY;  Service: Endoscopy;  Laterality: N/A;  . FEMUR FRACTURE SURGERY Right 1950s  . FRACTURE SURGERY    . KNEE ARTHROSCOPY Left   . POSTERIOR LUMBAR FUSION    . ROTATOR CUFF REPAIR Left   . TONSILLECTOMY      Assessment & Plan Clinical Impression: Anthony Hayes is a 83 year old right-handed male with history of mild dementia, previous CVA, BPH, CKD stage III, multiple colon resections due to Crohn's disease.  History taken from chart review and patient. He is a resident of [facility] assisted living facility.  He was able to complete his own bathing, dressing and walks to the dining hall for his meals using a rolling walker.  He does not drive.  Presented 04/03/2018 with altered mental status.  There was reports of dizziness.  Cranial CT scan reviewed, unremarkable for acute intracranial process. MRI showed small areas of acute infarction affecting the  posterior right basal ganglia and radiating white matter tracts on the right.  No swelling or hemorrhage.  Extensive chronic small vessel ischemic changes.  Patient did not receive TPA.  Carotid Dopplers with no ICA stenosis.  Echocardiogram with ejection fraction of 60% no wall motion abnormalities.  No defect or PFO noted..  Maintained on aspirin as well as  Plavix for CVA prophylaxis.  Subcutaneous heparin for DVT prophylaxis.  A follow-up chest x-ray 04/05/2018 for cough showed mild bibasilar subsegmental atelectasis.  Tolerating a regular diet.  On 04/05/2018 noted some increased lethargy a follow-up cranial CT scan was completed as well as EEG that showed no seizure and scan showed expected evolution of acute subacute infarcts involving the right corona radiata and basal ganglia.  No hemorrhage or other acute abnormality.  Patient with elevated WBCs at 20,600 as well as creatinine mildly elevated 1.33 04/05/2018.  A follow-up urinalysis study positive nitrite with large leukocytes 04/05/2018 with culture pending and placed on empiric Rocephin and repeat WBC of 13,700 as well as creatinine 1.20.  Physical Occupational Therapy evaluations completed with recommendations of physical medicine rehab consult.  Patient was admitted for a comprehensive rehab program    Patient transferred to CIR on 04/06/2018 .    Patient currently requires max with basic self-care skills secondary to muscle weakness, decreased cardiorespiratoy endurance, motor apraxia, ataxia, decreased coordination and decreased motor planning, decreased initiation, decreased attention, decreased awareness, decreased problem solving, decreased safety awareness, decreased memory and delayed processing and decreased sitting balance, decreased standing balance, decreased postural control, hemiplegia and decreased balance strategies.  Prior to hospitalization, patient could complete ADLs with modified independent .  Patient will benefit from skilled intervention to decrease level of assist with basic self-care skills and increase independence with basic self-care skills prior to discharge back to ALF.  Anticipate patient will require minimal physical assistance and follow up home health.  OT - End of Session Activity Tolerance: Tolerates 10 - 20 min activity with multiple rests Endurance Deficit:  Yes Endurance Deficit Description: Required rest breaks throughout ADL tasks. OT Assessment Rehab Potential (ACUTE ONLY): Fair OT Barriers to Discharge: Decreased caregiver support OT Barriers to Discharge Comments: Unsure of available assist at d/c OT Patient demonstrates impairments in the following area(s): Balance;Behavior;Cognition;Safety;Endurance;Skin Integrity;Motor OT Basic ADL's Functional Problem(s): Grooming;Bathing;Dressing;Toileting OT Transfers Functional Problem(s): Tub/Shower;Toilet OT Additional Impairment(s): Fuctional Use of Upper Extremity OT Plan OT Intensity: Minimum of 1-2 x/day, 45 to 90 minutes OT Frequency: 5 out of 7 days OT Duration/Estimated Length of Stay: 2.5-3 weeks  OT Treatment/Interventions: Balance/vestibular training;Discharge planning;Functional electrical stimulation;Pain management;Self Care/advanced ADL retraining;UE/LE Coordination activities;Therapeutic Activities;Therapeutic Exercise;Skin care/wound managment;Patient/family education;Functional mobility training;Disease mangement/prevention;Cognitive remediation/compensation;Community reintegration;DME/adaptive equipment instruction;Neuromuscular re-education;Psychosocial support;Splinting/orthotics;UE/LE Strength taining/ROM OT Self Feeding Anticipated Outcome(s): Supervision/ set-up OT Basic Self-Care Anticipated Outcome(s): Min A OT Toileting Anticipated Outcome(s): Min A OT Bathroom Transfers Anticipated Outcome(s): Min A OT Recommendation Patient destination: Assisted Living Follow Up Recommendations: Home health OT Equipment Recommended: To be determined   Skilled Therapeutic Intervention Pt seen for OT eval and ADL session. Pt attempting to eat breakfast slouched in bed upon arrival, agreeable to tx session and transferring to EOB to cont meal. He required max A and mlutimodal cuing for sequencing/technique for transfer to sitting EOB. He ate breakfast seated EOB, intiailly min A for  static sitting balance progressing to max A with fatigue and distraction. Pt L handed and demonstrated decreased awareness of deficits and apraxic movements when attempting to use his dominant L UE in order  to self feed.  He completed mod A squat pivot transfer to w/c with multimodal cuing for sequencing and technique. Dressing tasks completed from w/c level, pt limited by decreased ROM and coordination in L UE in order to assist with self-dressing. He stood at sink with mod-max A, B UE support while therapist assisted with clothing management, VCs for erect posture.  Max stand pivot transfers w/c <> elevated BSC over toilet with heavy reliance on grab bars and max cuing for sequencing. Assist required for grooming tasks from w/c level at sink due to L UE weakness and discoordination. Pt left seated in w/c at end of session, QRB donned and all needs in reach. Education provided throughout session regarding role of OT, POC, OT goals, use of call bell and d/c planning.     OT Evaluation Precautions/Restrictions  Precautions Precautions: Fall Precaution Comments: L side hemiplegia and ataxia Restrictions Weight Bearing Restrictions: No General Chart Reviewed: Yes Additional Pertinent History: hx of dementia per chart review Family/Caregiver Present: No Pain Pain Assessment Pain Scale: 0-10 Pain Score: 0-No pain Home Living/Prior Functioning Home Living Family/patient expects to be discharged to:: Assisted living Living Arrangements: Other (Comment)(ALF) Type of Home: Assisted living Home Access: Level entry Home Layout: One level Bathroom Shower/Tub: Holiday representative Accessibility: Yes Additional Comments: Resident at [facility]F. All PLOF and home set up confirmed by daughter, who is a [facility] SLP per chart review  Lives With: Other (Comment)(Lives in ALF ) IADL History Homemaking Responsibilities: No Current License: No Occupation: Retired Prior Function Level of  Independence: Requires assistive device for independence Driving: No Comments: uses rollator and completes own dressing and bathing. Walks to meals Vision Baseline Vision/History: Wears glasses Wears Glasses: At all times Patient Visual Report: No change from baseline Vision Assessment?: No apparent visual deficits Perception  Perception: Within Functional Limits Praxis Praxis: Impaired Praxis Impairment Details: Motor planning Cognition   Sensation   Motor  Motor Motor: Motor apraxia;Hemiplegia;Ataxia Motor - Skilled Clinical Observations: L side weakness and apraxia; generalized weakness with flexed posture Mobility  Bed Mobility Bed Mobility: Sit to Supine;Supine to Sit Supine to Sit: HOB flat;5: Supervision Supine to Sit Details: Verbal cues for technique;Verbal cues for precautions/safety Supine to Sit Details (indicate cue type and reason): increased time d/t slow initiation Sit to Supine: HOB flat;4: Min assist Sit to Supine - Details (indicate cue type and reason): assist for LLE onto bed Transfers Sit to Stand: 3: Mod assist Sit to Stand Details: Verbal cues for technique;Verbal cues for precautions/safety;Tactile cues for weight shifting;Tactile cues for sequencing  Trunk/Postural Assessment  Cervical Assessment Cervical Assessment: Exceptions to WFL(Forward head) Thoracic Assessment Thoracic Assessment: Exceptions to WFL(Rounded shoulders; kyphotic) Lumbar Assessment Lumbar Assessment: Exceptions to [facility] Postural Control Postural Control: (Pelvic obliquity impacting functional sitting balance/ control)  Balance   Extremity/Trunk Assessment RUE Assessment RUE Assessment: Within Functional Limits LUE Assessment LUE Assessment: Exceptions to WFL(~75 degree ROM limited due to ataxia and weakness) LUE Strength LUE Overall Strength Comments: ~75 degrees shoulder flexion against gravity; Able to move remainder UE joints against gravity   See Function Navigator  for Current Functional Status.   Refer to Care Plan for Long Term Goals  Recommendations for other services: None    Discharge Criteria: Patient will be discharged from OT if patient refuses treatment 3 consecutive times without medical reason, if treatment goals not met, if there is a change in medical status, if patient makes no progress towards goals or if patient is discharged  from hospital.  The above assessment, treatment plan, treatment alternatives and goals were discussed and mutually agreed upon: by patient  Kasey Hansell L 04/07/2018, 10:34 AM

## 2018-04-07 NOTE — Plan of Care (Signed)
  Problem: RH BOWEL ELIMINATION Goal: RH STG MANAGE BOWEL WITH ASSISTANCE Description STG Manage Bowel with Assistance. Mod I  04/07/2018 1433 by Claude Manges, LPN Outcome: Progressing 04/07/2018 1343 by Claude Manges, LPN Outcome: Progressing   Problem: RH BLADDER ELIMINATION Goal: RH STG MANAGE BLADDER WITH ASSISTANCE Description STG Manage Bladder With Assistance. Mod I  04/07/2018 1433 by Claude Manges, LPN Outcome: Progressing 04/07/2018 1343 by Claude Manges, LPN Outcome: Progressing   Problem: RH SKIN INTEGRITY Goal: RH STG MAINTAIN SKIN INTEGRITY WITH ASSISTANCE Description STG Maintain Skin Integrity With Assistance. Mod I  04/07/2018 1433 by Claude Manges, LPN Outcome: Progressing 04/07/2018 1343 by Claude Manges, LPN Outcome: Progressing   Problem: RH SAFETY Goal: RH STG ADHERE TO SAFETY PRECAUTIONS W/ASSISTANCE/DEVICE Description STG Adhere to Safety Precautions With Assistance/Device. Min  04/07/2018 1433 by Claude Manges, LPN Outcome: Progressing 04/07/2018 1343 by Claude Manges, LPN Outcome: Progressing   Problem: RH COGNITION-NURSING Goal: RH STG USES MEMORY AIDS/STRATEGIES W/ASSIST TO PROBLEM SOLVE Description STG Uses Memory Aids/Strategies With Assistance to Problem Solve. Min cues  04/07/2018 1433 by Claude Manges, LPN Outcome: Progressing 04/07/2018 1343 by Claude Manges, LPN Outcome: Progressing

## 2018-04-07 NOTE — Progress Notes (Addendum)
PMR Admission Coordinator Pre-Admission Assessment  Patient: Anthony Hayes is an 83 y.o., male MRN: [id number] DOB: July 11, 1935 Height: 5' 5"  (165.1 cm) Weight: 79.4 kg (175 lb)                                                                                                                                                  Insurance Information HMO:     PPO:      PCP:      IPA:      80/20:      OTHER:  PRIMARY: Medicare A & B      Policy#: [id number]      Subscriber: Self CM Name:       Phone#:      Fax#:  Pre-Cert#: Eligible       Employer: Retired  Benefits:  Phone #: Verified online     Name: [employer]  Eff. Date: 06/27/02     Deduct: $1364      Out of Pocket Max: N/A      Life Max: N/A CIR: 100%      SNF: 100% days 1-20; 80% days 21-100 Outpatient: 80%     Co-Pay: 20% Home Health: 100%      Co-Pay: $0 DME: 80%     Co-Pay: 20% Providers: Patient's Choice   SECONDARY: Roseanna Rainbow       Policy#: [id number]      Subscriber: Self CM Name:       Phone#:      Fax#:  Pre-Cert#:       Employer: Retired Benefits:  Phone #: [phone]     Name:  Eff. Date:      Deduct:       Out of Pocket Max:       Life Max:  CIR:       SNF:  Outpatient:      Co-Pay:  Home Health:       Co-Pay:  DME:      Co-Pay:   Medicaid Application Date:       Case Manager:  Disability Application Date:       Case Worker:   Emergency Contact Information         Contact Information    Name Relation Home Work Mobile   Anthony Hayes Daughter   [phone]   Anthony Hayes Daughter   [phone]     Current Medical History  Patient Admitting Diagnosis: Right basal ganglia infarct  History of Present Illness: [name] is an 83 year old right-handed male with history of mild dementia, previous CVA, BPH, CKD stage III, multiple colon resections due to Crohn's disease. He is a resident of [facility] assisted living facility. He was able to complete his own bathing, dressing, and walks to  the dining hall for his meals using a rolling walker. He does not drive. Presented  04/03/2018 with altered mental status. There was reports of dizziness. Cranial CT scan showed no acute changes. MRI showed small areas of acute infarction affecting the posterior right basal ganglia and radiating white matter tracts on the right. No swelling or hemorrhage. Extensive chronic small vessel ischemic changes. Patient did not receive TPA. Carotid Dopplers with no ICA stenosis. Echocardiogram with ejection fraction of 60% no wall motion abnormalities. No defect or PFO noted. Maintained on aspirin as well as Plavix for CVA prophylaxis. Subcutaneous heparin for DVT prophylaxis.A follow-up chest x-ray 04/05/2018 for cough showed mild bibasilar subsegmental atelectasis. Tolerating a regular diet.On 04/05/2018 noted some increased lethargy a follow-up cranial CT scan was completed as well as EEG that showed no seizure and scan showed expected evolution of acute subacute infarcts involving the right corona radiata and basal ganglia. No hemorrhage or other acute abnormality. Patient with elevated WBCs at 20,600 as well as creatinine mildly elevated 1.33, 04/05/2018. A follow-up urinalysis study positive nitrite with large leukocytes 04/05/2018 with culture pending and placed on empiric Rocephinand repeat WBC of 13,700 as well as creatinine 1.20. Physical and occupational therapy evaluations completed with recommendations of physical medicine rehab consult. Patient was admitted for a comprehensive rehab program 04/06/18.  NIH Total: 6  Past Medical History      Past Medical History:  Diagnosis Date  . Arthritis    "joints" (10/15/2017)  . Benign prostatic hyperplasia   . Complication of anesthesia    "last OR in 06/2017 affected him cognitively; hasn't got back to baseline yet" (10/15/2017)  . Dementia   . Depression   . GERD (gastroesophageal reflux disease)   . High cholesterol   .  Hypertension   . Hypothyroidism   . Renal disease   . Renal disorder   . TIA (transient ischemic attack) early 2000s    Family History  family history is not on file.  Prior Rehab/Hospitalizations:  Has the patient had major surgery during 100 days prior to admission? No  Current Medications   Current Facility-Administered Medications:  .  0.9 %  sodium chloride infusion, , Intravenous, Continuous, Anthony Hawking, Hayes, Last Rate: 50 mL/hr at 04/05/18 1653 .  acetaminophen (TYLENOL) tablet 650 mg, 650 mg, Oral, Q4H PRN **OR** acetaminophen (TYLENOL) solution 650 mg, 650 mg, Per Tube, Q4H PRN **OR** acetaminophen (TYLENOL) suppository 650 mg, 650 mg, Rectal, Q4H PRN, Anthony Hayes, Anthony Hayes .  aspirin EC tablet 81 mg, 81 mg, Oral, Daily, Anthony Hayes, 81 mg at 04/05/18 0928 .  cefTRIAXone (ROCEPHIN) 1 g in sodium chloride 0.9 % 100 mL IVPB, 1 g, Intravenous, Q24H, Anthony Hayes, Stopped at 04/05/18 2136 .  clopidogrel (PLAVIX) tablet 75 mg, 75 mg, Oral, Daily, Anthony Hayes, 75 mg at 04/05/18 0929 .  guaiFENesin-dextromethorphan (ROBITUSSIN DM) 100-10 MG/5ML syrup 10 mL, 10 mL, Oral, QID PRN, Anthony Hayes, Anthony Hayes .  heparin injection 5,000 Units, 5,000 Units, Subcutaneous, Q8H, Anthony Hayes, 5,000 Units at 04/06/18 0644 .  hydrochlorothiazide (HYDRODIURIL) tablet 25 mg, 25 mg, Oral, Daily, Anthony Hayes, 25 mg at 04/05/18 0928 .  imipramine (TOFRANIL) tablet 25 mg, 25 mg, Oral, QHS, Anthony Hayes, 25 mg at 04/05/18 2132 .  ipratropium (ATROVENT) 0.06 % nasal spray 2 spray, 2 spray, Each Nare, QID, Anthony Hayes, 2 spray at 04/05/18 2137 .  levothyroxine (SYNTHROID, LEVOTHROID) tablet 25 mcg, 25 mcg, Oral, QAC breakfast, Anthony Hayes, 25 mcg at 04/06/18 0644 .  loratadine (CLARITIN) tablet 10 mg, 10 mg, Oral, Daily, Anthony Hayes, 10 mg at 04/05/18 0928 .  oxybutynin (DITROPAN) tablet 5 mg, 5 mg, Oral, Daily, Anthony Hayes, 5 mg at 04/05/18 0928 .  pantoprazole (PROTONIX) EC tablet 40 mg, 40 mg, Oral, BID, Anthony Hayes, 40 mg at 04/05/18 2132 .  potassium chloride SA (K-DUR,KLOR-CON) CR tablet 40 mEq, 40 mEq, Oral, Once, Anthony Hayes .  senna-docusate (Senokot-S) tablet 1 tablet, 1 tablet, Oral, QHS PRN, Anthony Hayes, Anthony Hayes .  tamsulosin (FLOMAX) capsule 0.4 mg, 0.4 mg, Oral, Daily, Anthony Hayes, Anthony Hayes, 0.4 mg at 04/05/18 0929 .  vitamin B-12 (CYANOCOBALAMIN) tablet 500 mcg, 500 mcg, Oral, Daily, Anthony Hayes, 500 mcg at 04/05/18 9030  Patients Current Diet: Fall precautions Diet Heart Room service appropriate? Yes; Fluid consistency: Thin  Precautions / Restrictions Precautions Precautions: Fall Precaution Comments: L sided weakness and sensation defcitis Restrictions Weight Bearing Restrictions: No   Has the patient had 2 or more falls or a fall with injury in the past year?No  Prior Activity Level Limited Community (1-2x/wk): Prior to admission patient was fully independent in his ALF apartment except for medication management. He walked to meals with his walker and was active in his community there.   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 Walker used intermittently in apartment and consistently when out of the apartment   Prior Functional Level Prior Function Level of Independence: Independent with assistive device(s) Comments: uses rollator and completes own dressing and bathing. Walks to meals  Self Care: Did the patient need help bathing, dressing, using the toilet or eating? Independent  Indoor Mobility: Did the patient need assistance with walking from room to room (with or without device)? Independent  Stairs: Did the patient need assistance with internal or external stairs (with or without device)?  Independent  Functional Cognition: Did the patient need help planning regular tasks such as shopping or remembering to take medications? Dependent  Current Functional Level Cognition  Arousal/Alertness: Awake/alert Overall Cognitive Status: Impaired/Different from baseline Current Attention Level: Focused Orientation Level: Disoriented to situation, Oriented to person, Oriented to place, Disoriented to time Following Commands: Follows one step commands inconsistently, Follows one step commands with increased time General Comments: Pt breathing heavy and closing eyes throughout session. Pt very fatigued but agreeable to participate in therapy. Attention: Sustained Sustained Attention: Appears intact Memory: Impaired Memory Impairment: Retrieval deficit Awareness: Impaired Awareness Impairment: Intellectual impairment, Anticipatory impairment Problem Solving: Impaired Problem Solving Impairment: Functional complex Safety/Judgment: Impaired Comments: per daughter, patient attempting to get up on own    Extremity Assessment (includes Sensation/Coordination)  Upper Extremity Assessment: LUE deficits/detail LUE Deficits / Details: Weak grasp strength and unable to bring hand to mouth. Pt with decreased finger opposition. Able to touch thumb to 1-4 digits with increased time  Lower Extremity Assessment: Defer to PT evaluation    ADLs  Overall ADL's : Needs assistance/impaired Eating/Feeding: Moderate assistance, Sitting Grooming: Wash/dry face, Minimal assistance, Sitting Grooming Details (indicate cue type and reason): Pt washign his face with increased time while sitting in recliner. Pt using RUE only Upper Body Bathing: Moderate assistance, Sitting Lower Body Bathing: Maximal assistance, Sit to/from stand Upper Body Dressing : Moderate assistance, Sitting Lower Body Dressing: Maximal assistance, Sit to/from stand Lower Body Dressing Details (indicate cue type and reason): Pt  unable  to lean forward to adjust socks Toilet Transfer: Moderate assistance, +2 for physical assistance Toilet Transfer Details (indicate cue type and reason): Pt performing sit<>stand only with Mod A +2 for stability and to power into standing Functional mobility during ADLs: Moderate assistance, +2 for physical assistance(sit<>stand only) General ADL Comments: Pt lethargic and fatigued. Continue to be motivated to participate in therapy despite fatigue.     Mobility  Overal bed mobility: Needs Assistance Bed Mobility: Sit to Supine Supine to sit: Min assist Sit to supine: +2 for physical assistance, Max assist General bed mobility comments: In recliner upon arrival    Transfers  Overall transfer level: Needs assistance Equipment used: None Transfers: Sit to/from Stand, Stand Pivot Transfers Sit to Stand: Mod assist, +2 physical assistance Stand pivot transfers: Max assist General transfer comment: Mod A for power up into standing with +2 for safety in standing    Ambulation / Gait / Stairs / Wheelchair Mobility  Ambulation/Gait Ambulation/Gait assistance: Museum/gallery curator (Feet): 120 Feet Assistive device: 4-wheeled walker Gait Pattern/deviations: Step-through pattern, Decreased stride length General Gait Details: Deferred due to lethargy and change in status. Gait velocity: slow Gait velocity interpretation: Below normal speed for age/gender    Posture / Balance Dynamic Sitting Balance Sitting balance - Comments: pt with L lateral lean Balance Overall balance assessment: Needs assistance Sitting-balance support: Feet supported, No upper extremity supported Sitting balance-[name] Scale: Fair Sitting balance - Comments: pt with L lateral lean Standing balance support: During functional activity Standing balance-[name] Scale: Poor Standing balance comment: Not able to stand without external support with bil knee flexion.     Special needs/care  consideration BiPAP/CPAP: No CPM: No Continuous Drip IV: No Dialysis: No        Life Vest: No Oxygen: No Special Bed: No Trach Size: No Wound Vac (area): No       Skin: Dry, Stage 1 pressure ulcer on sacrum                                Bowel mgmt: Continent, last BM 04/05/18; PTA urgency and intermittent incontinence due to Crohn's disease and wore depends  Bladder mgmt: Incontinent with external catheter placed; PTA urgency and intermittent incontinence and wore depends   Diabetic mgmt: HgbA1c: 5.4     Previous Home Environment Type of Home: Assisted living Additional Comments: Resident at [facility]. All PLOF and home set up confirmed by daughter, who is a [place] in [place].   Discharge Living Setting Plans for Discharge Living Setting: Patient's home, Apartment, Other (Comment)(ALF) Type of Home at Discharge: [place] Name at Discharge: [facility]  Discharge Home Layout: One level Discharge Home Access: Level entry Discharge Bathroom Shower/Tub: Walk-in shower Discharge Bathroom Toilet: Handicapped height Discharge Bathroom Accessibility: Yes How Accessible: Accessible via walker Does the patient have any problems obtaining your medications?: No  Social/Family/Support Systems Patient Roles: Parent Contact Information: Daughter Whitney primary contact  Anticipated Caregiver: ALF Ability/Limitations of Caregiver: Daughters both work full time  Careers adviser: Intermittent Discharge Plan Discussed with Primary Caregiver: Yes Is Caregiver In Agreement with Plan?: Yes Does Caregiver/Family have Issues with Lodging/Transportation while Pt is in Rehab?: No  Goals/Additional Needs Patient/Family Goal for Rehab: PT/OT: Mod I; SLP: Mod I-Supervision  Expected length of stay: 7-10days  Cultural Considerations: Baptist  Dietary Needs: Heart Healthy diet restrictions  Equipment Needs: TBD Special Service Needs: can add on services  in  ALF if needed  Additional Information: Patient is a retired Public librarian to Admission and willing to participate: Yes Program Orientation Provided & Reviewed with Pt/Caregiver Including Roles  & Responsibilities: Yes  Decrease burden of Care through IP rehab admission: No  Possible need for SNF placement upon discharge: Potentially  Patient Condition: This patient's medical and functional status has changed since the consult dated: 04/04/18 at 12:52pm in which the Rehabilitation Physician determined and documented that the patient's condition is appropriate for intensive rehabilitative care in an inpatient rehabilitation facility. See "History of Present Illness" (above) for medical update. Functional changes are: Mod A 3 feet. Patient's medical and functional status update has been discussed with the Rehabilitation physician and patient remains appropriate for inpatient rehabilitation. Will admit to inpatient rehab today.  Preadmission Screen Completed By:  Gunnar Fusi, 04/06/2018 9:34 AM ______________________________________________________________________   Discussed status with Dr. Posey Pronto on 04/06/18 at 1235 and received telephone approval for admission today.  Admission Coordinator:  Gunnar Fusi, time 1235/Date 04/06/18         Revision History

## 2018-04-07 NOTE — Evaluation (Signed)
Physical Therapy Assessment and Plan  Patient Details  Name: Anthony Hayes MRN: [id number] Date of Birth: 1935-05-19  PT Diagnosis: Abnormal posture, Abnormality of gait, Cognitive deficits, Coordination disorder, Difficulty walking, Hemiparesis non-dominant, Impaired cognition and Muscle weakness Rehab Potential: Good ELOS: 18-21 days   Today's Date: 04/07/2018 PT Individual Time: 0900-1000 PT Individual Time Calculation (min): 60 min    Problem List:  Patient Active Problem List   Diagnosis Date Noted  . Infarction of right basal ganglia ([place]) 04/06/2018  . Pressure injury of skin 04/06/2018  . Vitamin B12 deficiency   . Hypothyroidism   . Crohn's disease with complication ([place])   . Benign prostatic hyperplasia   . Acute lower UTI   . Dementia without behavioral disturbance   . Acute ischemic stroke ([place]) 04/05/2018  . Leukocytosis   . Shortness of breath   . Acute encephalopathy   . CVA (cerebral vascular accident) ([place]) 04/03/2018  . Calculus, kidney 01/21/2018  . Acute blood loss anemia   . Mallory-Weiss syndrome   . Esophageal stricture   . Respiratory failure ([place])   . UGIB (upper gastrointestinal bleed) 10/16/2017  . GERD (gastroesophageal reflux disease) 10/16/2017  . Essential hypertension, benign 10/16/2017  . Chest pain 10/15/2017  . Hypothyroidism (acquired) 06/08/2017  . Chronic kidney disease, stage III (moderate) ([place]) 03/19/2016  . Dyslipidemia 03/19/2016  . Degeneration of lumbar or lumbosacral intervertebral disc 07/10/2008  . Arthropathy, lower leg 05/23/2004    Past Medical History:  Past Medical History:  Diagnosis Date  . Arthritis    "joints" (10/15/2017)  . Benign prostatic hyperplasia   . Complication of anesthesia    "last OR in 06/2017 affected him cognitively; hasn't got back to baseline yet" (10/15/2017)  . Dementia   . Depression   . GERD (gastroesophageal reflux disease)   . High cholesterol   . Hypertension   . Hypothyroidism   .  Renal disease   . Renal disorder   . TIA (transient ischemic attack) early 2000s   Past Surgical History:  Past Surgical History:  Procedure Laterality Date  . ABDOMINAL EXPLORATION SURGERY  7/  . APPENDECTOMY    . CATARACT EXTRACTION W/ INTRAOCULAR LENS  IMPLANT, BILATERAL Bilateral   . COLECTOMY  1950s   "thought he had iteilis"  . ESOPHAGOGASTRODUODENOSCOPY N/A 10/22/2017   Procedure: ESOPHAGOGASTRODUODENOSCOPY (EGD);  Surgeon: Gatha Mayer, MD;  Location: [facility] ENDOSCOPY;  Service: Endoscopy;  Laterality: N/A;  . ESOPHAGOGASTRODUODENOSCOPY (EGD) WITH PROPOFOL N/A 10/16/2017   Procedure: ESOPHAGOGASTRODUODENOSCOPY (EGD) WITH PROPOFOL;  Surgeon: Milus Banister, MD;  Location: [facility] ENDOSCOPY;  Service: Endoscopy;  Laterality: N/A;  . FEMUR FRACTURE SURGERY Right 1950s  . FRACTURE SURGERY    . KNEE ARTHROSCOPY Left   . POSTERIOR LUMBAR FUSION    . ROTATOR CUFF REPAIR Left   . TONSILLECTOMY      Assessment & Plan Clinical Impression: [name] is a 83 year old right-handed male with history of mild dementia, previous CVA, BPH, CKD stage III, multiple colon resections due to Crohn's disease.  History taken from chart review and patient. He is a resident of [facility] assisted living facility.  He was able to complete his own bathing, dressing and walks to the dining hall for his meals using a rolling walker.  He does not drive.  Presented 04/03/2018 with altered mental status.  There was reports of dizziness.  Cranial CT scan reviewed, unremarkable for acute intracranial process. MRI showed small areas of acute infarction affecting the posterior right  basal ganglia and radiating white matter tracts on the right.  No swelling or hemorrhage.  Extensive chronic small vessel ischemic changes.  Patient did not receive TPA.  Carotid Dopplers with no ICA stenosis.  Echocardiogram with ejection fraction of 60% no wall motion abnormalities.  No defect or PFO noted..  Maintained on aspirin as well  as Plavix for CVA prophylaxis.  Subcutaneous heparin for DVT prophylaxis.  A follow-up chest x-ray 04/05/2018 for cough showed mild bibasilar subsegmental atelectasis.  Tolerating a regular diet.  On 04/05/2018 noted some increased lethargy a follow-up cranial CT scan was completed as well as EEG that showed no seizure and scan showed expected evolution of acute subacute infarcts involving the right corona radiata and basal ganglia.  No hemorrhage or other acute abnormality.  Patient with elevated WBCs at 20,600 as well as creatinine mildly elevated 1.33 04/05/2018.  A follow-up urinalysis study positive nitrite with large leukocytes 04/05/2018 with culture pending and placed on empiric Rocephin and repeat WBC of 13,700 as well as creatinine 1.20.  Physical Occupational Therapy evaluations completed with recommendations of physical medicine rehab consult.  Patient was admitted for a comprehensive rehab program. Patient transferred to CIR on 04/06/2018 .   Patient currently requires max with mobility secondary to muscle weakness and muscle joint tightness, decreased cardiorespiratoy endurance, impaired timing and sequencing, unbalanced muscle activation, decreased coordination and decreased motor planning, decreased initiation, decreased attention, decreased awareness, decreased problem solving, decreased safety awareness, decreased memory and delayed processing and decreased sitting balance, decreased standing balance, decreased postural control, hemiplegia and decreased balance strategies.  Prior to hospitalization, patient was supervision with mobility and lived with Other (Comment)(Lives in ALF ) in a Assisted living home.  Home access is  Level entry.  Patient will benefit from skilled PT intervention to maximize safe functional mobility, minimize fall risk and decrease caregiver burden for planned discharge home with 24 hour assist.  Anticipate patient will benefit from follow up [facility] at discharge.  PT - End of  Session Activity Tolerance: Tolerates 30+ min activity with multiple rests Endurance Deficit: Yes Endurance Deficit Description: fatigues quickly with short duration mobility activities PT Assessment Rehab Potential (ACUTE/IP ONLY): Good PT Barriers to Discharge: Other (comments);Incontinence PT Barriers to Discharge Comments: acute on chronic CVA, pre-morbid dementia PT Patient demonstrates impairments in the following area(s): Balance;Sensory;Behavior;Endurance;Motor;Perception;Safety;Skin Integrity PT Transfers Functional Problem(s): Bed Mobility;Bed to Chair;Car;Furniture PT Locomotion Functional Problem(s): Ambulation;Wheelchair Mobility PT Plan PT Intensity: Minimum of 1-2 x/day ,45 to 90 minutes PT Frequency: 5 out of 7 days PT Duration Estimated Length of Stay: 18-21 days PT Treatment/Interventions: Stair training;UE/LE Strength taining/ROM;Therapeutic Exercise;Wheelchair propulsion/positioning;Ambulation/gait training;Discharge planning;Functional mobility training;Psychosocial support;Therapeutic Activities;Visual/perceptual remediation/compensation;Skin care/wound management;Neuromuscular re-education;Disease management/prevention;Balance/vestibular training;Cognitive remediation/compensation;DME/adaptive equipment instruction;Pain management;Splinting/orthotics;UE/LE Coordination activities;Patient/family education;Community reintegration PT Transfers Anticipated Outcome(s): S PT Locomotion Anticipated Outcome(s): minA ambulate with LRAD PT Recommendation Recommendations for Other Services: Speech consult;Therapeutic Recreation consult;Neuropsych consult Follow Up Recommendations: Home health PT Patient destination: Assisted Living(return to ALF) Equipment Recommended: To be determined Equipment Details: has rollator  Skilled Therapeutic Intervention Pt received seated in w/c, denies pain and agreeable to treatment. Assessed mobility as described below with mod/maxA overall d/t  L hemiparesis and cognitive deficits. Educated pt on rehab process, goals, estimated length of stay all to be determined following completion of evaluations. Pt agreeable to all the above. Remained seated in w/c at end of session, chair alarm and quick release belt intact and all needs in reach.   PT Evaluation Precautions/Restrictions Precautions Precautions: Fall Precaution Comments:  L side hemiplegia and ataxia Restrictions Weight Bearing Restrictions: No General Chart Reviewed: Yes PT Missed Treatment Reason: Not applicable Response to Previous Treatment: Patient reporting fatigue but able to participate. Family/Caregiver Present: No Vital Signs  Pain Pain Assessment Pain Scale: 0-10 Pain Score: 0-No pain Home Living/Prior Functioning Home Living Living Arrangements: Other (Comment)(ALF) Type of Home: Assisted living Home Access: Level entry Home Layout: One level Bathroom Shower/Tub: Holiday representative Accessibility: Yes Additional Comments: Resident at [facility] ALF. All PLOF and home set up confirmed by daughter, who is a [facility] SLP per chart review  Lives With: Other (Comment)(Lives in ALF ) Prior Function Level of Independence: Requires assistive device for independence Driving: No Comments: uses rollator and completes own dressing and bathing. Walks to meals Vision/Perception  Perception Perception: Within Functional Limits Praxis Praxis: Impaired Praxis Impairment Details: Motor planning  Cognition Overall Cognitive Status: Impaired/Different from baseline Arousal/Alertness: Awake/alert Orientation Level: Oriented to person;Oriented to time;Disoriented to situation;Disoriented to place Attention: Sustained Sustained Attention: Appears intact Memory: Impaired Memory Impairment: Retrieval deficit;Decreased recall of new information Awareness: Impaired Safety/Judgment: Impaired Sensation Sensation Light Touch: Appears Intact Proprioception: Appears  Intact Coordination Gross Motor Movements are Fluid and Coordinated: No Heel Shin Test: impaired LLE Motor  Motor Motor: Motor apraxia;Hemiplegia;Ataxia Motor - Skilled Clinical Observations: L side weakness and apraxia; generalized weakness with flexed posture  Mobility Bed Mobility Bed Mobility: Sit to Supine;Supine to Sit Supine to Sit: HOB flat;5: Supervision Supine to Sit Details: Verbal cues for technique;Verbal cues for precautions/safety Supine to Sit Details (indicate cue type and reason): increased time d/t slow initiation Sit to Supine: HOB flat;4: Min assist Sit to Supine - Details (indicate cue type and reason): assist for LLE onto bed Transfers Transfers: Yes Sit to Stand: 3: Mod assist Sit to Stand Details: Verbal cues for technique;Verbal cues for precautions/safety;Tactile cues for weight shifting;Tactile cues for sequencing Stand Pivot Transfers: 3: Mod assist;With armrests Stand Pivot Transfer Details: Verbal cues for technique;Verbal cues for precautions/safety Squat Pivot Transfers: 3: Mod assist;With upper extremity assistance Squat Pivot Transfer Details: Verbal cues for technique;Verbal cues for precautions/safety Locomotion  Ambulation Ambulation: Yes Ambulation/Gait Assistance: 3: Mod assist Ambulation Distance (Feet): 10 Feet Assistive device: Other (Comment)(R rail in hall) Ambulation/Gait Assistance Details: Verbal cues for technique;Verbal cues for precautions/safety;Tactile cues for weight shifting;Tactile cues for placement Ambulation/Gait Assistance Details: assist for placement LLE d/t scissoring Gait Gait: Yes Gait Pattern: Impaired Gait Pattern: Poor foot clearance - left;Narrow base of support;Scissoring;Left flexed knee in stance;Left foot flat;Decreased stride length;Decreased step length - left;Decreased stance time - left Gait velocity: decreased Stairs / Additional Locomotion Stairs: No Wheelchair Mobility Wheelchair Mobility:  Yes Wheelchair Assistance: 2: Max Technical sales engineer Details: Verbal cues for sequencing;Verbal cues for technique;Tactile cues for sequencing;Tactile cues for placement;Verbal cues for precautions/safety Wheelchair Propulsion: Both upper extremities Wheelchair Parts Management: Needs assistance Distance: 20'  Trunk/Postural Assessment  Cervical Assessment Cervical Assessment: Exceptions to WFL(Forward head) Thoracic Assessment Thoracic Assessment: Exceptions to WFL(Rounded shoulders; kyphotic) Lumbar Assessment Lumbar Assessment: Exceptions to [facility] Postural Control Postural Control: (Pelvic obliquity impacting functional sitting balance/ control)  Balance Balance Balance Assessed: Yes Static Sitting Balance Static Sitting - Balance Support: Feet supported;No upper extremity supported Static Sitting - Level of Assistance: 5: Stand by assistance Dynamic Sitting Balance Dynamic Sitting - Balance Support: During functional activity;Feet supported Dynamic Sitting - Level of Assistance: 4: Min assist;5: Stand by assistance Dynamic Sitting - Balance Activities: Lateral lean/weight shifting;Reaching across midline;Forward lean/weight shifting;Reaching for objects Sitting  balance - Comments: Sitting edge of mat with dynamic RUE reaching Static Standing Balance Static Standing - Balance Support: During functional activity;Bilateral upper extremity supported Static Standing - Level of Assistance: 3: Mod assist Dynamic Standing Balance Dynamic Standing - Balance Support: During functional activity Dynamic Standing - Level of Assistance: 2: Max assist Dynamic Standing - Balance Activities: Lateral lean/weight shifting;Forward lean/weight shifting;Reaching for objects Extremity Assessment  RUE Assessment RUE Assessment: Within Functional Limits LUE Assessment LUE Assessment: Exceptions to WFL(~75 degree ROM limited due to ataxia and weakness) LUE Strength LUE Overall Strength  Comments: ~75 degrees shoulder flexion against gravity; Able to move remainder UE joints against gravity RLE Assessment RLE Assessment: Exceptions to WFL(grossly 4/5 throughout; hamstring ROM limited to 140 degrees in 90/90 position) LLE Assessment LLE Assessment: Exceptions to WFL(hip flexion 3/5, knee extension 3/5, knee flexion 4-/5, ankle dorsiflexion 4/5, ankle plantarflexion 4/5; hamstring limited to 175 degrees in 90/90 position)   See Function Navigator for Current Functional Status.   Refer to Care Plan for Long Term Goals  Recommendations for other services: Therapeutic Recreation  Outing/community reintegration  Discharge Criteria: Patient will be discharged from PT if patient refuses treatment 3 consecutive times without medical reason, if treatment goals not met, if there is a change in medical status, if patient makes no progress towards goals or if patient is discharged from hospital.  The above assessment, treatment plan, treatment alternatives and goals were discussed and mutually agreed upon: by patient  Luberta Mutter 04/07/2018, 12:24 PM

## 2018-04-07 NOTE — Progress Notes (Signed)
Physical Therapy Session Note  Patient Details  Name: Anthony Hayes MRN: [id number] Date of Birth: 06/16/1935  Today's Date: 04/07/2018 PT Individual Time: 5806-3868 PT Individual Time Calculation (min): 30 min   Skilled Therapeutic Interventions/Progress Updates:    Session focused on functional car transfer and initiation of stair negotiation. Pt required max assist to get into car (to the L) demonstrating impaired problem solving skills, decreased postural control, and decreased awareness of L side of body in space and mod assist for squat pivot out of car. Step by step cues needed for sequencing. Mod assist for sit -> stand at stairs using bilateral rails for support with facilitation for anterior weightshift. Mod assist to ascend 1 step, but max to descend due to poor eccentric control of LLE. Attempted w/c mobility with BLE for reciprocal movement pattern re-training  X 25' with cues for initiation of use of LLE.   Therapy Documentation Precautions:  Precautions Precautions: Fall Precaution Comments: L side hemiplegia and ataxia Restrictions Weight Bearing Restrictions: No   Pain: Pain Assessment Pain Scale: 0-10 Pain Score: 0-No pain   See Function Navigator for Current Functional Status.   Therapy/Group: Individual Therapy  Canary Brim Ivory Broad, PT, DPT  04/07/2018, 11:50 AM

## 2018-04-07 NOTE — Care Management Note (Signed)
Inpatient Rehabilitation Center Individual Statement of Services  Patient Name:  Patric Buckhalter  Date:  04/07/2018  Welcome to the [facility].  Our goal is to provide you with an individualized program based on your diagnosis and situation, designed to meet your specific needs.  With this comprehensive rehabilitation program, you will be expected to participate in at least 3 hours of rehabilitation therapies Monday-Friday, with modified therapy programming on the weekends.  Your rehabilitation program will include the following services:  Physical Therapy (PT), Occupational Therapy (OT), Speech Therapy (ST), 24 hour per day rehabilitation nursing, Case Management (Social Worker), Rehabilitation Medicine, Nutrition Services and Pharmacy Services  Weekly team conferences will be held on Wednesday to discuss your progress.  Your Social Worker will talk with you frequently to get your input and to update you on team discussions.  Team conferences with you and your family in attendance may also be held.  Expected length of stay: 2.5-3 weeks  Overall anticipated outcome: supervision-min assist level  Depending on your progress and recovery, your program may change. Your Social Worker will coordinate services and will keep you informed of any changes. Your Social Worker's name and contact numbers are listed  below.  The following services may also be recommended but are not provided by the [place]:    [place] will be made to provide these services after discharge if needed.  Arrangements include referral to agencies that provide these services.  Your insurance has been verified to be:  [place] Your primary doctor is:  Jaclynn Guarneri [facility] [place])  Pertinent information will be shared with your doctor and your insurance company.  Social Worker:  Ovidio Kin,  [place] or ([phone]  Information discussed with and copy given to patient by: [name], 04/07/2018, 9:55 AM

## 2018-04-07 NOTE — Progress Notes (Signed)
Physical Medicine and Rehabilitation Consult Reason for Consult: Decreased functional mobility with altered mental status Referring Physician: [facility]   HPI: Anthony Hayes is a 83 y.o. right-handed male with history of mild dementia, previous CVA, multiple colon resections due to Crohn's disease.  Patient is a resident at [facility] assisted living facility.  Patient was able to complete his own bathing dressing and walks to the dining hall for his meals using a rolling walker.  He does not drive.  Presented 04/03/2018 with altered mental status.  There has been reports of dizziness.  Cranial CT scan showed no acute changes.  MRI showed small areas of acute infarction affecting the posterior right basal ganglia and radiating white matter tracts on the right.  No swelling or hemorrhage.  Extensive chronic small vessel ischemic changes.  Patient did not receive TPA.  Echocardiogram is currently pending.  Maintained on aspirin for CVA prophylaxis.  Neurology consulted with workup currently ongoing.  Physical therapy evaluation completed 04/04/2018 with recommendations of physical medicine rehab consult.   Review of Systems  Constitutional: Positive for diaphoresis. Negative for chills and fever.  HENT: Negative for hearing loss.   Eyes: Negative for blurred vision and double vision.  Respiratory: Negative for cough.   Cardiovascular: Negative for chest pain, palpitations and leg swelling.  Gastrointestinal: Positive for constipation. Negative for nausea.       GERD  Genitourinary: Positive for urgency. Negative for dysuria and hematuria.  Musculoskeletal: Positive for joint pain and myalgias.  Skin: Negative for rash.  Neurological: Negative for seizures.  Psychiatric/Behavioral: Positive for depression and memory loss.  All other systems reviewed and are negative.      Past Medical History:  Diagnosis Date  . Arthritis    "joints" (10/15/2017)  . Benign prostatic hyperplasia   .  Complication of anesthesia    "last OR in 06/2017 affected him cognitively; hasn't got back to baseline yet" (10/15/2017)  . Dementia   . Depression   . GERD (gastroesophageal reflux disease)   . High cholesterol   . Hypertension   . Hypothyroidism   . Renal disease   . Renal disorder   . TIA (transient ischemic attack) early 2000s        Past Surgical History:  Procedure Laterality Date  . ABDOMINAL EXPLORATION SURGERY  7/  . APPENDECTOMY    . CATARACT EXTRACTION W/ INTRAOCULAR LENS  IMPLANT, BILATERAL Bilateral   . COLECTOMY  1950s   "thought he had iteilis"  . ESOPHAGOGASTRODUODENOSCOPY N/A 10/22/2017   Procedure: ESOPHAGOGASTRODUODENOSCOPY (EGD);  Surgeon: Gatha Mayer, MD;  Location: [facility] ENDOSCOPY;  Service: Endoscopy;  Laterality: N/A;  . ESOPHAGOGASTRODUODENOSCOPY (EGD) WITH PROPOFOL N/A 10/16/2017   Procedure: ESOPHAGOGASTRODUODENOSCOPY (EGD) WITH PROPOFOL;  Surgeon: Milus Banister, MD;  Location: [facility] ENDOSCOPY;  Service: Endoscopy;  Laterality: N/A;  . FEMUR FRACTURE SURGERY Right 1950s  . FRACTURE SURGERY    . KNEE ARTHROSCOPY Left   . POSTERIOR LUMBAR FUSION    . ROTATOR CUFF REPAIR Left   . TONSILLECTOMY     No family history on file. Social History:  reports that he quit smoking about 56 years ago. His smoking use included cigarettes. He quit after 7.00 years of use. He has never used smokeless tobacco. He reports that he drinks alcohol. He reports that he does not use drugs. Allergies:       Allergies  Allergen Reactions  . Lisinopril Swelling    Swelling of lips  Medications Prior to Admission  Medication Sig Dispense Refill  . aspirin EC 81 MG tablet Take 81 mg by mouth daily.    . cetirizine (ZYRTEC) 10 MG tablet Take 10 mg by mouth daily.    . hydrochlorothiazide (HYDRODIURIL) 25 MG tablet Take 25 mg by mouth daily.    Marland Kitchen imipramine (TOFRANIL) 25 MG tablet Take 25 mg by mouth at bedtime.    Marland Kitchen  ipratropium (ATROVENT) 0.06 % nasal spray Place 2 sprays into both nostrils 4 (four) times daily.    Marland Kitchen levothyroxine (SYNTHROID, LEVOTHROID) 50 MCG tablet Take 25 mcg by mouth every morning.     Marland Kitchen oxybutynin (DITROPAN) 5 MG tablet Take 5 mg by mouth every morning.    . pantoprazole (PROTONIX) 40 MG tablet Take 1 tablet (40 mg total) by mouth 2 (two) times daily. 60 tablet 0  . promethazine-dextromethorphan (PROMETHAZINE-DM) 6.25-15 MG/5ML syrup Take 10 mLs by mouth 4 (four) times daily as needed for cough.    . tamsulosin (FLOMAX) 0.4 MG CAPS capsule Take 1 capsule (0.4 mg total) by mouth daily. 14 capsule 0  . cephALEXin (KEFLEX) 500 MG capsule Take 1 capsule (500 mg total) by mouth 4 (four) times daily. (Patient not taking: Reported on 04/03/2018) 28 capsule 0    Home: Home Living Family/patient expects to be discharged to:: Inpatient rehab Additional Comments: pt resident at [facility]. Pt reports he was doing own dressing/bathing, feeding, and walking to dinning all.  Functional History: Prior Function Level of Independence: Independent with assistive device(s) Comments: uses rollator and completes own dressing and bathing Functional Status:  Mobility: Bed Mobility Overal bed mobility: Needs Assistance Bed Mobility: Supine to Sit Supine to sit: Min assist General bed mobility comments: pt with decreased frunction of L UE requiring minA for trunk elevation and to scoot to EOB to get feet flat on floor Transfers Overall transfer level: Needs assistance Equipment used: Rolling walker (2 wheeled) Transfers: Sit to/from Stand Sit to Stand: Min assist General transfer comment: v/cs to push up from chair/bed, minA to power up and steady during transition of hands from bed to RW Ambulation/Gait Ambulation/Gait assistance: Min assist Ambulation Distance (Feet): 120 Feet Assistive device: 4-wheeled walker Gait Pattern/deviations: Step-through pattern, Decreased stride  length General Gait Details: pt required min/modA to maintain L hand on RW due to impaired grip strength. Pt able to clear both LEs but noted deceased step length Gait velocity: slow Gait velocity interpretation: Below normal speed for age/gender  ADL:  Cognition: Cognition Overall Cognitive Status: Impaired/Different from baseline Orientation Level: Oriented X4 Cognition Arousal/Alertness: Awake/alert Behavior During Therapy: Flat affect Overall Cognitive Status: Impaired/Different from baseline Area of Impairment: Orientation, Problem solving Orientation Level: Disoriented to, Situation Problem Solving: Slow processing, Decreased initiation, Difficulty sequencing, Requires verbal cues, Requires tactile cues General Comments: pt with stool incontinence but aware he needed ot use bathroom  Blood pressure (!) 149/83, pulse 61, temperature 98.3 F (36.8 C), temperature source Oral, resp. rate 17, height 5' 5"  (1.651 m), weight 79.4 kg (175 lb), SpO2 96 %. Physical Exam  Vitals reviewed. Constitutional: He appears well-developed.  HENT:  Head: Normocephalic.  Eyes: EOM are normal. Right eye exhibits no discharge. Left eye exhibits no discharge.  Neck: Normal range of motion. Neck supple. No thyromegaly present.  Cardiovascular: Normal rate, regular rhythm and normal heart sounds.  Respiratory: Effort normal and breath sounds normal. No respiratory distress.  GI: Soft. Bowel sounds are normal. He exhibits no distension.  Neurological: He is  alert.  Patient provides his name.  Follows simple commands.  Makes good eye contact with examiner. Left hemiparesis  Skin: Skin is warm and dry.  Assessment/Plan: Diagnosis: Right basal ganglia infarct 1. Does the need for close, 24 hr/day medical supervision in concert with the patient's rehab needs make it unreasonable for this patient to be served in a less intensive setting? Yes 2. Co-Morbidities requiring supervision/potential  complications: HTN, CKD III, mild dementia, post-stroke sequelae 3. Due to bladder management, bowel management, safety, skin/wound care, disease management, medication administration, pain management and patient education, does the patient require 24 hr/day rehab nursing? Yes 4. Does the patient require coordinated care of a physician, rehab nurse, PT (1-2 hrs/day, 5 days/week) and OT (1-2 hrs/day, 5 days/week) to address physical and functional deficits in the context of the above medical diagnosis(es)? Yes Addressing deficits in the following areas: balance, endurance, locomotion, strength, transferring, bowel/bladder control, bathing, dressing, feeding, grooming, toileting and psychosocial support 5. Can the patient actively participate in an intensive therapy program of at least 3 hrs of therapy per day at least 5 days per week? Yes 6. The potential for patient to make measurable gains while on inpatient rehab is excellent 7. Anticipated functional outcomes upon discharge from inpatient rehab are modified independent  with PT, modified independent with OT, n/a with SLP. 8. Estimated rehab length of stay to reach the above functional goals is: 7-10 days 9. Anticipated D/C setting: Home 10. Anticipated post D/C treatments: HH therapy and Outpatient therapy 11. Overall Rehab/Functional Prognosis: excellent  RECOMMENDATIONS: This patient's condition is appropriate for continued rehabilitative care in the following setting: CIR Patient has agreed to participate in recommended program. Yes Note that insurance prior authorization may be required for reimbursement for recommended care.  Comment: Rehab Admissions Coordinator to follow up.  Thanks,  Meredith Staggers, MD, Mellody Drown    Lavon Paganini Angiulli, PA-C 04/04/2018          Revision History                        Routing History

## 2018-04-07 NOTE — Plan of Care (Signed)
  Problem: RH BOWEL ELIMINATION Goal: RH STG MANAGE BOWEL WITH ASSISTANCE Description STG Manage Bowel with Assistance. Mod I  Outcome: Progressing   Problem: RH BLADDER ELIMINATION Goal: RH STG MANAGE BLADDER WITH ASSISTANCE Description STG Manage Bladder With Assistance. Mod I  Outcome: Progressing   Problem: RH SKIN INTEGRITY Goal: RH STG MAINTAIN SKIN INTEGRITY WITH ASSISTANCE Description STG Maintain Skin Integrity With Assistance. Mod I  Outcome: Progressing   Problem: RH SAFETY Goal: RH STG ADHERE TO SAFETY PRECAUTIONS W/ASSISTANCE/DEVICE Description STG Adhere to Safety Precautions With Assistance/Device. Min  Outcome: Progressing   Problem: RH COGNITION-NURSING Goal: RH STG USES MEMORY AIDS/STRATEGIES W/ASSIST TO PROBLEM SOLVE Description STG Uses Memory Aids/Strategies With Assistance to Problem Solve. Min cues  Outcome: Progressing

## 2018-04-07 NOTE — Progress Notes (Addendum)
Subjective/Complaints: No issues overnight  View of systems denies chest pain shortness of breath nausea vomiting diarrhea constipation  Objective: Vital Signs: Blood pressure 116/80, pulse 73, temperature 98.5 F (36.9 C), temperature source Oral, resp. rate 18, height 5' 7"  (1.702 m), weight 75.6 kg (166 lb 10.7 oz), SpO2 94 %. Ct Head Wo Contrast  Result Date: 04/05/2018 CLINICAL DATA:  Stroke, follow-up. EXAM: CT HEAD WITHOUT CONTRAST TECHNIQUE: Contiguous axial images were obtained from the base of the skull through the vertex without intravenous contrast. COMPARISON:  MRI and CT of the head 04/03/2018. FINDINGS: Brain: The study is mildly degraded by patient motion. Focal infarct involving the right corona radiata and basal ganglia is noted. Moderate diffuse white matter changes also present. White matter changes within the internal capsule are again noted bilaterally. No acute hemorrhage is present. The brainstem demonstrates white matter disease. The cerebellum is unremarkable. Vascular: Atherosclerotic calcifications are present within the cavernous internal carotid arteries bilaterally. There is no hyperdense vessel. Skull: Calvarium is intact. No focal lytic or blastic lesions are present. Sinuses/Orbits: The paranasal sinuses and mastoid air cells are clear. Bilateral lens replacements are present. Globes and orbits are within normal limits. IMPRESSION: 1. Expected evolution acute/subacute infarcts involving the right corona radiata and basal ganglia. 2. No hemorrhage or other acute abnormality. 3. Stable diffuse white matter disease. Electronically Signed   By: [name] M.D.   On: 04/05/2018 16:40   Dg Chest Port 1 View  Result Date: 04/06/2018 CLINICAL DATA:  Leukocytosis EXAM: PORTABLE CHEST 1 VIEW COMPARISON:  Chest x-rays dated 04/05/2018 and 04/03/2018. FINDINGS: Mild cardiomegaly is stable. Overall cardiomediastinal silhouette is stable. Lungs are clear. No pleural  effusion or pneumothorax seen. No acute or suspicious osseous finding. IMPRESSION: No active disease. Electronically Signed   By: Franki Cabot M.D.   On: 04/06/2018 08:25   Dg Chest Port 1 View  Result Date: 04/05/2018 CLINICAL DATA:  Seven cough.  Shortness of breath. EXAM: PORTABLE CHEST 1 VIEW COMPARISON:  02/02/2018. FINDINGS: Mediastinum and hilar structures normal. Mild cardiomegaly with normal pulmonary vascularity. Mild bibasilar subsegmental atelectasis and/or scarring. No pleural effusion or pneumothorax. Degenerative changes scoliosis thoracic spine. IMPRESSION: Mild bibasilar subsegmental atelectasis and/or scarring. Electronically Signed   By: Marcello Moores  Register   On: 04/05/2018 10:34   Results for orders placed or performed during the hospital encounter of 04/03/18 (from the past 72 hour(s))  CBC     Status: Abnormal   Collection Time: 04/05/18  1:52 PM  Result Value Ref Range   WBC 20.6 (H) 4.0 - 10.5 K/uL   RBC 4.69 4.22 - 5.81 MIL/uL   Hemoglobin 13.8 13.0 - 17.0 g/dL   HCT 41.0 39.0 - 52.0 %   MCV 87.4 78.0 - 100.0 fL   MCH 29.4 26.0 - 34.0 pg   MCHC 33.7 30.0 - 36.0 g/dL   RDW 14.8 11.5 - 15.5 %   Platelets 323 150 - 400 K/uL    Comment: Performed at [facility], [place] [address] [place], [place] [address]  Basic metabolic panel     Status: Abnormal   Collection Time: 04/05/18  1:52 PM  Result Value Ref Range   Sodium 133 (L) 135 - 145 mmol/L   Potassium 3.8 3.5 - 5.1 mmol/L   Chloride 97 (L) 101 - 111 mmol/L   CO2 22 22 - 32 mmol/L   Glucose, Bld 105 (H) 65 - 99 mg/dL   BUN 17 6 - 20 mg/dL  Creatinine, Ser 1.33 (H) 0.61 - 1.24 mg/dL   Calcium 9.3 8.9 - 10.3 mg/dL   GFR calc non Af Amer 49 (L) >60 mL/min   GFR calc Af Amer 57 (L) >60 mL/min    Comment: (NOTE) The eGFR has been calculated using the CKD EPI equation. This calculation has not been validated in all clinical situations. eGFR's persistently <60 mL/min signify possible Chronic Kidney Disease.     Anion gap 14 5 - 15    Comment: Performed at [facility] [address], [place], [place] [address]  Urinalysis, Complete w Microscopic     Status: Abnormal   Collection Time: 04/05/18  3:09 PM  Result Value Ref Range   Color, Urine YELLOW YELLOW   APPearance HAZY (A) CLEAR   Specific Gravity, Urine 1.017 1.005 - 1.030   pH 5.0 5.0 - 8.0   Glucose, UA NEGATIVE NEGATIVE mg/dL   Hgb urine dipstick MODERATE (A) NEGATIVE   Bilirubin Urine NEGATIVE NEGATIVE   Ketones, ur NEGATIVE NEGATIVE mg/dL   Protein, ur 30 (A) NEGATIVE mg/dL   Nitrite POSITIVE (A) NEGATIVE   Leukocytes, UA LARGE (A) NEGATIVE   RBC / HPF 0-5 0 - 5 RBC/hpf   WBC, UA TOO NUMEROUS TO COUNT 0 - 5 WBC/hpf   Bacteria, UA RARE (A) NONE SEEN   Squamous Epithelial / LPF 0-5 (A) NONE SEEN   Mucus PRESENT     Comment: Performed at [facility], [place] [address], [place], [place] [address]  Culture, blood (Routine X 2) w Reflex to ID Panel     Status: None (Preliminary result)   Collection Time: 04/05/18  3:51 PM  Result Value Ref Range   Specimen Description BLOOD LEFT HAND    Special Requests      BOTTLES DRAWN AEROBIC ONLY Blood Culture results may not be optimal due to an inadequate volume of blood received in culture bottles   Culture      NO GROWTH < 24 HOURS Performed at [facility] [address], [place], [place] [address]    Report Status PENDING   Culture, blood (Routine X 2) w Reflex to ID Panel     Status: None (Preliminary result)   Collection Time: 04/05/18  3:55 PM  Result Value Ref Range   Specimen Description BLOOD RIGHT HAND    Special Requests      BOTTLES DRAWN AEROBIC AND ANAEROBIC Blood Culture adequate volume   Culture      NO GROWTH < 24 HOURS Performed at [facility], [place] [address] [place], [place] [address]    Report Status PENDING   Urinalysis, Complete w Microscopic     Status: Abnormal   Collection Time: 04/05/18  8:00 PM  Result Value Ref Range   Color,  Urine YELLOW YELLOW   APPearance HAZY (A) CLEAR   Specific Gravity, Urine 1.005 1.005 - 1.030   pH 5.0 5.0 - 8.0   Glucose, UA NEGATIVE NEGATIVE mg/dL   Hgb urine dipstick MODERATE (A) NEGATIVE   Bilirubin Urine NEGATIVE NEGATIVE   Ketones, ur NEGATIVE NEGATIVE mg/dL   Protein, ur NEGATIVE NEGATIVE mg/dL   Nitrite NEGATIVE NEGATIVE   Leukocytes, UA LARGE (A) NEGATIVE   RBC / HPF 0-5 0 - 5 RBC/hpf   WBC, UA TOO NUMEROUS TO COUNT 0 - 5 WBC/hpf   Bacteria, UA MANY (A) NONE SEEN   Squamous Epithelial / LPF 0-5 (A) NONE SEEN   WBC Clumps  PRESENT    Mucus PRESENT    Hyaline Casts, UA PRESENT     Comment: Performed at [facility], [place] [address], [place], [place] [address]  CBC     Status: Abnormal   Collection Time: 04/06/18  4:41 AM  Result Value Ref Range   WBC 13.7 (H) 4.0 - 10.5 K/uL   RBC 4.28 4.22 - 5.81 MIL/uL   Hemoglobin 12.6 (L) 13.0 - 17.0 g/dL   HCT 37.2 (L) 39.0 - 52.0 %   MCV 86.9 78.0 - 100.0 fL   MCH 29.4 26.0 - 34.0 pg   MCHC 33.9 30.0 - 36.0 g/dL   RDW 14.9 11.5 - 15.5 %   Platelets 306 150 - 400 K/uL    Comment: Performed at [facility], [place] [address], [place], [place] [address]  Basic metabolic panel     Status: Abnormal   Collection Time: 04/06/18  4:41 AM  Result Value Ref Range   Sodium 134 (L) 135 - 145 mmol/L   Potassium 3.2 (L) 3.5 - 5.1 mmol/L   Chloride 99 (L) 101 - 111 mmol/L   CO2 22 22 - 32 mmol/L   Glucose, Bld 102 (H) 65 - 99 mg/dL   BUN 17 6 - 20 mg/dL   Creatinine, Ser 1.20 0.61 - 1.24 mg/dL   Calcium 8.7 (L) 8.9 - 10.3 mg/dL   GFR calc non Af Amer 55 (L) >60 mL/min   GFR calc Af Amer >60 >60 mL/min    Comment: (NOTE) The eGFR has been calculated using the CKD EPI equation. This calculation has not been validated in all clinical situations. eGFR's persistently <60 mL/min signify possible Chronic Kidney Disease.    Anion gap 13 5 - 15    Comment: Performed at [facility] [address], [place], [place] [address]      HEENT: normal Cardio: RRR and no murmur Resp: CTA B/L and unlabored GI: BS positive and NT, ND Extremity:  No Edema Skin:   Intact Neuro: Confused, Abnormal Sensory intact to LT, BUE , Abnormal Motor 3- Left delt, 3 at L Biceps , triceps and grip, 3- HF, KE ADF and Other no evidence of neglect Musc/Skel:  Other L>R knee and hip contracture Gen NAD, kyphotic sitting posture   Assessment/Plan: 1. Functional deficits secondary to RIght BG infarction which require 3+ hours per day of interdisciplinary therapy in a comprehensive inpatient rehab setting. Physiatrist is providing close team supervision and 24 hour management of active medical problems listed below. Physiatrist and rehab team continue to assess barriers to discharge/monitor patient progress toward functional and medical goals. FIM:                   Function - Comprehension Comprehension: Auditory  Function - Expression Expression: Verbal           Medical Problem List and Plan: 1.  Altered mental status with decreased functional mobility secondary to right basal ganglia infarction.  Plan aspirin and Plavix times 3 weeks then Plavix alone CIR PT OT speech 2.  DVT Prophylaxis/Anticoagulation: Subcutaneous heparin. 3. Pain Management: Tylenol as needed 4. Mood/mild dementia.  Tofranil 25 mg nightly.  Discuss baseline with family, anticholinergic effect may worsen cognition will d/c tofranil 5. Neuropsych: This patient is capable of making decisions on his own behalf. 6. Skin/Wound Care: Routine skin checks 7. Fluids/Electrolytes/Nutrition: Routine INO's with follow-up chemistries 8.  CKD stage III.  Follow-up chemistries 9.  Hypertension.  HCTZ  25 mg daily.  Monitor with increased mobility 10.  History of BPH.  Currently on both Flomax and Ditropan as prior to admission.  Check PVR x3 11.  UTI/leukocytosis.  Continue Rocephin as directed, urine culture shows Klebsiella, sensitivities are pending,  blood cultures x2- at 2 days 12.  Hyperlipidemia.  Pravachol 13.  Multiple colon resections due to Crohn's disease.  Continue to monitor 14.  Hypothyroidism.  TSH 4.601.Synthroid 15.  Vitamin B12 deficiency.  Continue vitamin B12 16.  Knee and hip contracture, amb with walker at home 17.  Hypokalemia we will supplement with KCl 20 mEq/day  LOS (Days) 1 A FACE TO FACE EVALUATION WAS PERFORMED  Charlett Blake 04/07/2018, 7:54 AM

## 2018-04-08 ENCOUNTER — Inpatient Hospital Stay (HOSPITAL_COMMUNITY): Payer: Medicare Other | Admitting: Physical Therapy

## 2018-04-08 ENCOUNTER — Inpatient Hospital Stay (HOSPITAL_COMMUNITY): Payer: Medicare Other | Admitting: Speech Pathology

## 2018-04-08 ENCOUNTER — Inpatient Hospital Stay (HOSPITAL_COMMUNITY): Payer: Medicare Other | Admitting: Occupational Therapy

## 2018-04-08 LAB — URINE CULTURE: Culture: >=100000 — AB

## 2018-04-08 MED ORDER — MELATONIN 3 MG PO TABS
3.0000 mg | ORAL_TABLET | Freq: Every day | ORAL | Status: DC
  Administered 2018-04-08 – 2018-04-21 (×13): 3 mg via ORAL
  Filled 2018-04-08 (×15): qty 1

## 2018-04-08 NOTE — Progress Notes (Signed)
Physical Therapy Session Note  Patient Details  Name: Anthony Hayes MRN: [id number] Date of Birth: [birth year]  Today's Date: 04/08/2018 PT Individual Time: 0806-0930 PT Individual Time Calculation (min): 84 min   Short Term Goals: Week 1:  PT Short Term Goal 1 (Week 1): Pt will perform bed mobility with S PT Short Term Goal 2 (Week 1): Pt will perform stand pivot transfer minA PT Short Term Goal 3 (Week 1): Pt will ambulate x50' modA PT Short Term Goal 4 (Week 1): Pt will perform w/c propulsion x50' with minA  Skilled Therapeutic Interventions/Progress Updates:  Pt received asleep in bed and awakened with max stimulus. Pt able to state his name is Anthony Hayes and reports he is hungry. Pt requires max assist to transfer to sitting EOB as pt with absent initiation of movement. Once sitting EOB pt requires total assist to don pants and to pull over hips; pt able to transfer to standing with mod assist. Sitting EOB pt demonstrates L lateral lean and frequently falling asleep. Attempted to increase alertness with use of cold cloth on face and max verbal cuing. Pt requires mod assist to sit EOB 2/2 impaired balance and alertness despite cuing to correct posture. Attempted to assist pt with eating breakfast EOB but pt continuing to close eyes. Assisted pt to w/c via stand pivot max assist with max multimodal cuing for sequencing and safety. RN arrives and administers medication, afterwards pt reports need to use restroom but observed to already be incontinent of urine. Pt attempting to move out of w/c with therapist providing max cuing for safety to position him in bathroom first. Pt completes stand pivot w/c<>elevated toilet with use of grab bar and min assist. Therapist doffs soiled brief & clothing and dons clean brief & clothing total assist. Pt unable to void on toilet. Once back in chair pt transported to dayroom via w/c total assist for time management. Pt consumed breakfast with LUE for forced use (pt reports  he is L hand dominant) with assistance & cuing to manipulate utensil in hands and to grasp/let go of objects. Pt with difficulty holding cups of syrup/juice to open them and with difficulty turning wrist to pour syrup. Pt requires max cuing to consume liquids during meal. Addressed cognition during activity with pt requiring max encouragement to attempt to answer questions before reporting "I don't know". Pt able to state he is in the hospital in [place] and requires choice of 2 to recall having a stroke. Pt unable to recall where he was living PTA. At end of session pt left sitting in w/c in room with quick release belt & chair alarm donned, all needs within reach, and set up with remaining meal.  Safety plan in room updated to reflect pt's need for setup assist for all meal trays.   Therapy Documentation Precautions:  Precautions Precautions: Fall Precaution Comments: L side hemiplegia and ataxia Restrictions Weight Bearing Restrictions: No   See Function Navigator for Current Functional Status.   Therapy/Group: Individual Therapy  Waunita Schooner 04/08/2018, 9:49 AM

## 2018-04-08 NOTE — IPOC Note (Signed)
Overall Plan of Care [facility]) Patient Details Name: Anthony Hayes MRN: [id number] DOB: 13-May-1935  Admitting Diagnosis: <principal problem not specified>  Hospital Problems: Active Problems:   Infarction of right basal ganglia (HCC)     Functional Problem List: Nursing Bladder, Bowel, Endurance, Medication Management, Safety, Skin Integrity  PT Balance, Sensory, Behavior, Endurance, Motor, Perception, Safety, Skin Integrity  OT Balance, Behavior, Cognition, Safety, Endurance, Skin Integrity, Motor  SLP Cognition  TR         Basic ADL's: OT Grooming, Bathing, Dressing, Toileting     Advanced  ADL's: OT       Transfers: PT Bed Mobility, Bed to Chair, Car, Patent attorney, Agricultural engineer: PT Ambulation, Emergency planning/management officer     Additional Impairments: OT Fuctional Use of Upper Extremity  SLP Social Cognition   Problem Solving, Memory, Attention, Awareness  TR      Anticipated Outcomes Item Anticipated Outcome  Self Feeding Supervision/ set-up  Swallowing      Basic self-care  Min A  Toileting  Min A   Bathroom Transfers Min A  Bowel/Bladder  maintain regular pattern of bowel and bladder emptying. Maintain skin integrity.  Transfers  S  Locomotion  minA ambulate with LRAD  Communication     Cognition  Min assist   Pain  Remain free of pain  Safety/Judgment  Remain free of falls, skin breakdown and infection while on rehab   Therapy Plan: PT Intensity: Minimum of 1-2 x/day ,45 to 90 minutes PT Frequency: 5 out of 7 days PT Duration Estimated Length of Stay: 18-21 days OT Intensity: Minimum of 1-2 x/day, 45 to 90 minutes OT Frequency: 5 out of 7 days OT Duration/Estimated Length of Stay: 2.5-3 weeks  SLP Intensity: Minumum of 1-2 x/day, 30 to 90 minutes SLP Frequency: 3 to 5 out of 7 days SLP Duration/Estimated Length of Stay: 14 days     Team Interventions: Nursing Interventions Patient/Family Education, Bladder Management, Bowel  Management, Cognitive Remediation/Compensation, Medication Management, Discharge Planning, Skin Care/Wound Management  PT interventions Stair training, UE/LE Strength taining/ROM, Therapeutic Exercise, Wheelchair propulsion/positioning, Ambulation/gait training, Discharge planning, Functional mobility training, Psychosocial support, Therapeutic Activities, Visual/perceptual remediation/compensation, Skin care/wound management, Neuromuscular re-education, Disease management/prevention, Medical illustrator training, Cognitive remediation/compensation, DME/adaptive equipment instruction, Pain management, Splinting/orthotics, UE/LE Coordination activities, Patient/family education, Community reintegration  OT Interventions Training and development officer, Discharge planning, Functional electrical stimulation, Pain management, Self Care/advanced ADL retraining, UE/LE Coordination activities, Therapeutic Activities, Therapeutic Exercise, Skin care/wound managment, Patient/family education, Functional mobility training, Disease mangement/prevention, Cognitive remediation/compensation, Community reintegration, Engineer, drilling, Neuromuscular re-education, Psychosocial support, Splinting/orthotics, UE/LE Strength taining/ROM  SLP Interventions Cognitive remediation/compensation, English as a second language teacher, Functional tasks, Patient/family education, Internal/external aids, Environmental controls  TR Interventions    SW/CM Interventions Discharge Planning, Psychosocial Support, Patient/Family Education   Barriers to Discharge MD  Medical stability, Incontinence and Lack of/limited family support  Nursing      PT Other (comments), Incontinence acute on chronic CVA, pre-morbid dementia  OT Decreased caregiver support Unsure of available assist at d/c  SLP      SW       Team Discharge Planning: Destination: PT-Assisted Living(return to ALF) ,OT- Assisted Living , SLP-Assisted Living Projected Follow-up:  PT-Home health PT, OT-  Home health OT, SLP-Home Health SLP, 24 hour supervision/assistance, Outpatient SLP Projected Equipment Needs: PT-To be determined, OT- To be determined, SLP-None recommended by SLP Equipment Details: PT-has rollator, OT-  Patient/family involved in discharge planning: PT- Patient,  OT-Patient, SLP-Patient unable/family or caregive  not available  MD ELOS: 16-20d Medical Rehab Prognosis:  Good Assessment:  83 year old right-handed male with history of mild dementia, previous CVA, BPH, CKD stage III, multiple colon resections due to Crohn's disease.  History taken from chart review and patient. He is a resident of [facility] assisted living facility.  He was able to complete his own bathing, dressing and walks to the dining hall for his meals using a rolling walker.  He does not drive.  Presented 04/03/2018 with altered mental status.  There was reports of dizziness.  Cranial CT scan reviewed, unremarkable for acute intracranial process. MRI showed small areas of acute infarction affecting the posterior right basal ganglia and radiating white matter tracts on the right.  No swelling or hemorrhage.  Extensive chronic small vessel ischemic changes.  Patient did not receive TPA.  Carotid Dopplers with no ICA stenosis.  Echocardiogram with ejection fraction of 60% no wall motion abnormalities.  No defect or PFO noted..  Maintained on aspirin as well as Plavix for CVA prophylaxis.  Subcutaneous heparin for DVT prophylaxis.  A follow-up chest x-ray 04/05/2018 for cough showed mild bibasilar subsegmental atelectasis.  Tolerating a regular diet.  On 04/05/2018 noted some increased lethargy a follow-up cranial CT scan was completed as well as EEG that showed no seizure and scan showed expected evolution of acute subacute infarcts involving the right corona radiata and basal ganglia.  No hemorrhage or other acute abnormality.  Patient with elevated WBCs at 20,600 as well as creatinine mildly elevated  1.33 04/05/2018.  A follow-up urinalysis study positive nitrite with large leukocytes 04/05/2018 with culture pending and placed on empiric Rocephin and repeat WBC of 13,700 as well as creatinine 1.20.    Now requiring 24/7 Rehab RN,MD, as well as CIR level PT, OT and SLP.  Treatment team will focus on ADLs and mobility with goals set at [facility]   See Team Conference Notes for weekly updates to the plan of care

## 2018-04-08 NOTE — Progress Notes (Signed)
Speech Language Pathology Daily Session Note  Patient Details  Name: Anthony Hayes MRN: [id number] Date of Birth: 10-02-1935  Today's Date: 04/08/2018 SLP Individual Time: 9747-1855 SLP Individual Time Calculation (min): 45 min  Short Term Goals: Week 1: SLP Short Term Goal 1 (Week 1): Pt will sustain hiis attention to functional tasks for 10 minute intervals with mod verbal cues for redirection.  SLP Short Term Goal 2 (Week 1): Pt will complete basic familiar tasks with mod assist verbal cues for functional problem solving.  SLP Short Term Goal 3 (Week 1): Pt will recall daily information with mod assist verbal cues for use of external aids.  SLP Short Term Goal 4 (Week 1): Pt will return demonstration of at least 2 safety precautions during functional tasks with mod assist verbal cues.   Skilled Therapeutic Interventions: Skilled treatment session focused on cognition goals. SLP facilitated session by administering [place] [facility] d/t left UE weakness. Pt obtained score of 15 out of 22 with n => 18. Pt required more than a reasonable amount of time to perform mental problem solving, decreased recall of information and decreased intellectual awareness. Pt was returned to room, left upright in wheelchair, ssafety belt donned and all needs within reach.      Function:    Cognition Comprehension Comprehension assist level: Understands basic 50 - 74% of the time/ requires cueing 25 - 49% of the time;Understands basic 75 - 89% of the time/ requires cueing 10 - 24% of the time  Expression   Expression assist level: Expresses basic 75 - 89% of the time/requires cueing 10 - 24% of the time. Needs helper to occlude trach/needs to repeat words.;Expresses basic 90% of the time/requires cueing < 10% of the time.  Social Interaction Social Interaction assist level: Interacts appropriately 75 - 89% of the time - Needs redirection for appropriate language or to initiate interaction.  Problem Solving Problem  solving assist level: Solves basic 50 - 74% of the time/requires cueing 25 - 49% of the time;Solves basic 75 - 89% of the time/requires cueing 10 - 24% of the time  Memory Memory assist level: Recognizes or recalls 25 - 49% of the time/requires cueing 50 - 75% of the time    Pain    Therapy/Group: Individual Therapy  Nataliya Graig 04/08/2018, 3:33 PM

## 2018-04-08 NOTE — Progress Notes (Signed)
Subjective/Complaints: No issues overnight.  Drowsy this morning but awakens to voice  View of systems denies chest pain shortness of breath nausea vomiting diarrhea constipation  Objective: Vital Signs: Blood pressure (!) 120/55, pulse (!) 51, temperature 98.6 F (37 C), temperature source Oral, resp. rate 17, height 5' 7"  (1.702 m), weight 75.6 kg (166 lb 10.7 oz), SpO2 96 %. No results found. Results for orders placed or performed during the hospital encounter of 04/06/18 (from the past 72 hour(s))  CBC WITH DIFFERENTIAL     Status: Abnormal   Collection Time: 04/07/18  7:25 AM  Result Value Ref Range   WBC 12.0 (H) 4.0 - 10.5 K/uL   RBC 4.59 4.22 - 5.81 MIL/uL   Hemoglobin 13.7 13.0 - 17.0 g/dL   HCT 40.8 39.0 - 52.0 %   MCV 88.9 78.0 - 100.0 fL   MCH 29.8 26.0 - 34.0 pg   MCHC 33.6 30.0 - 36.0 g/dL   RDW 15.4 11.5 - 15.5 %   Platelets 311 150 - 400 K/uL   Neutrophils Relative % 77 %   Neutro Abs 9.2 (H) 1.7 - 7.7 K/uL   Lymphocytes Relative 12 %   Lymphs Abs 1.5 0.7 - 4.0 K/uL   Monocytes Relative 8 %   Monocytes Absolute 1.0 0.1 - 1.0 K/uL   Eosinophils Relative 3 %   Eosinophils Absolute 0.4 0.0 - 0.7 K/uL   Basophils Relative 0 %   Basophils Absolute 0.0 0.0 - 0.1 K/uL    Comment: Performed at [facility], [address] [place], [place] [address]  Comprehensive metabolic panel     Status: Abnormal   Collection Time: 04/07/18  7:25 AM  Result Value Ref Range   Sodium 134 (L) 135 - 145 mmol/L   Potassium 3.8 3.5 - 5.1 mmol/L   Chloride 101 101 - 111 mmol/L   CO2 19 (L) 22 - 32 mmol/L   Glucose, Bld 112 (H) 65 - 99 mg/dL   BUN 19 6 - 20 mg/dL   Creatinine, Ser 1.20 0.61 - 1.24 mg/dL   Calcium 9.4 8.9 - 10.3 mg/dL   Total Protein 7.4 6.5 - 8.1 g/dL   Albumin 3.7 3.5 - 5.0 g/dL   AST 23 15 - 41 U/L   ALT 15 (L) 17 - 63 U/L   Alkaline Phosphatase 106 38 - 126 U/L   Total Bilirubin 0.7 0.3 - 1.2 mg/dL   GFR calc non Af Amer 55 (L) >60 mL/min   GFR calc  Af Amer >60 >60 mL/min    Comment: (NOTE) The eGFR has been calculated using the CKD EPI equation. This calculation has not been validated in all clinical situations. eGFR's persistently <60 mL/min signify possible Chronic Kidney Disease.    Anion gap 14 5 - 15    Comment: Performed at [facility] [address], [place], [place] [address]     HEENT: normal Cardio: RRR and no murmur Resp: CTA B/L and unlabored GI: BS positive and NT, ND Extremity:  No Edema Skin:   Intact Neuro: Confused, Abnormal Sensory intact to LT, BUE , Abnormal Motor 3- Left delt, 3 at L Biceps , triceps and grip, 3- HF, KE ADF and Other no evidence of neglect Musc/Skel:  Other L>R knee and hip contracture Gen NAD, kyphotic sitting posture   Assessment/Plan: 1. Functional deficits secondary to RIght BG infarction which require 3+ hours per day of interdisciplinary therapy in a comprehensive inpatient rehab setting. Physiatrist  is providing close team supervision and 24 hour management of active medical problems listed below. Physiatrist and rehab team continue to assess barriers to discharge/monitor patient progress toward functional and medical goals. FIM: Function - Bathing Bathing activity did not occur: Refused  Function- Upper Body Dressing/Undressing What is the patient wearing?: Pull over shirt/dress Pull over shirt/dress - Perfomed by patient: Thread/unthread right sleeve Pull over shirt/dress - Perfomed by helper: Put head through opening, Pull shirt over trunk, Thread/unthread left sleeve Function - Lower Body Dressing/Undressing What is the patient wearing?: Pants, Socks, Shoes Pants- Performed by helper: Thread/unthread right pants leg, Thread/unthread left pants leg, Pull pants up/down Socks - Performed by helper: Don/doff right sock, Don/doff left sock Shoes - Performed by helper: Don/doff right shoe, Don/doff left shoe, Fasten left, Fasten right Assist for footwear: Maximal  assist  Function - Toileting Toileting steps completed by helper: Adjust clothing prior to toileting, Performs perineal hygiene, Adjust clothing after toileting Toileting Assistive Devices: Grab bar or rail  Function - Air cabin crew transfer assistive device: Elevated toilet seat/BSC over toilet, Grab bar Assist level to toilet: Maximal assist (Pt 25 - 49%/lift and lower) Assist level from toilet: Maximal assist (Pt 25 - 49%/lift and lower)  Function - Chair/bed transfer Chair/bed transfer method: Squat pivot, Stand pivot Chair/bed transfer assist level: Moderate assist (Pt 50 - 74%/lift or lower) Chair/bed transfer assistive device: Armrests Chair/bed transfer details: Verbal cues for technique, Verbal cues for precautions/safety, Verbal cues for sequencing  Function - Locomotion: Wheelchair Type: Manual Max wheelchair distance: 20 Assist Level: Maximal assistance (Pt 25 - 49%) Wheel 50 feet with 2 turns activity did not occur: Safety/medical concerns(fatigue) Wheel 150 feet activity did not occur: Safety/medical concerns Function - Locomotion: Ambulation Assistive device: Rail in hallway Max distance: 10 Assist level: Moderate assist (Pt 50 - 74%) Assist level: Moderate assist (Pt 50 - 74%) Walk 50 feet with 2 turns activity did not occur: Safety/medical concerns Walk 150 feet activity did not occur: Safety/medical concerns Walk 10 feet on uneven surfaces activity did not occur: Safety/medical concerns  Function - Comprehension Comprehension: Auditory Comprehension assist level: Understands basic 90% of the time/cues < 10% of the time  Function - Expression Expression: Verbal Expression assist level: Expresses basic 50 - 74% of the time/requires cueing 25 - 49% of the time. Needs to repeat parts of sentences.  Function - Social Interaction Social Interaction assist level: Interacts appropriately 25 - 49% of time - Needs frequent redirection.  Function - Problem  Solving Problem solving assist level: Solves basic 25 - 49% of the time - needs direction more than half the time to initiate, plan or complete simple activities  Function - Memory Memory assist level: Recognizes or recalls 25 - 49% of the time/requires cueing 50 - 75% of the time Patient normally able to recall (first 3 days only): Current season, That he or she is in a hospital  Medical Problem List and Plan: 1.  Altered mental status with decreased functional mobility secondary to right basal ganglia infarction.  Plan aspirin and Plavix times 3 weeks then Plavix alone CIR PT OT speech 2.  DVT Prophylaxis/Anticoagulation: Subcutaneous heparin. 3. Pain Management: Tylenol as needed 4. Mood/mild dementia.  Tofranil 25 mg nightly, anticholinergic effect may worsen cognition will d/c tofranil, trial melatonin for sleep 5. Neuropsych: This patient is capable of making decisions on his own behalf. 6. Skin/Wound Care: Routine skin checks 7. Fluids/Electrolytes/Nutrition: Routine INO's with follow-up chemistries 8.  CKD stage III.  Follow-up chemistries 9.  Hypertension.  HCTZ 25 mg daily.  Monitor with increased mobility 10.  History of BPH.  Currently on both Flomax and Ditropan as prior to admission.  Check PVR x3 11.  UTI/leukocytosis.  Continue Rocephin as directed, urine culture shows Klebsiella, sensitivities are pending, blood cultures x2- at 3 days 12.  Hyperlipidemia.  Pravachol 13.  Multiple colon resections due to Crohn's disease.  Continue to monitor 14.  Hypothyroidism.  TSH 4.601.Synthroid 15.  Vitamin B12 deficiency.  Continue vitamin B12 16.  Knee and hip contracture, amb with walker at home 17.  Hypokalemia we will supplement with KCl 20 mEq/day, improved  LOS (Days) 2 A FACE TO FACE EVALUATION WAS PERFORMED  Charlett Blake 04/08/2018, 9:13 AM

## 2018-04-08 NOTE — Evaluation (Addendum)
Speech Language Pathology Assessment and Plan  Patient Details  Name: Anthony Hayes MRN: [id number] Date of Birth: Sep 22, 1935  SLP Diagnosis: Cognitive Impairments  Rehab Potential: Fair ELOS: 14 days     Today's Date: 04/07/2018 SLP Individual Time: 41- 73     Problem List:  Patient Active Problem List   Diagnosis Date Noted  . Infarction of right basal ganglia ([place]) 04/06/2018  . Pressure injury of skin 04/06/2018  . Vitamin B12 deficiency   . Hypothyroidism   . Crohn's disease with complication ([place])   . Benign prostatic hyperplasia   . Acute lower UTI   . Dementia without behavioral disturbance   . Acute ischemic stroke ([place]) 04/05/2018  . Leukocytosis   . Shortness of breath   . Acute encephalopathy   . CVA (cerebral vascular accident) ([place]) 04/03/2018  . Calculus, kidney 01/21/2018  . Acute blood loss anemia   . Mallory-Weiss syndrome   . Esophageal stricture   . Respiratory failure ([place])   . UGIB (upper gastrointestinal bleed) 10/16/2017  . GERD (gastroesophageal reflux disease) 10/16/2017  . Essential hypertension, benign 10/16/2017  . Chest pain 10/15/2017  . Hypothyroidism (acquired) 06/08/2017  . Chronic kidney disease, stage III (moderate) ([place]) 03/19/2016  . Dyslipidemia 03/19/2016  . Degeneration of lumbar or lumbosacral intervertebral disc 07/10/2008  . Arthropathy, lower leg 05/23/2004   Past Medical History:  Past Medical History:  Diagnosis Date  . Arthritis    "joints" (10/15/2017)  . Benign prostatic hyperplasia   . Complication of anesthesia    "last OR in 06/2017 affected him cognitively; hasn't got back to baseline yet" (10/15/2017)  . Dementia   . Depression   . GERD (gastroesophageal reflux disease)   . High cholesterol   . Hypertension   . Hypothyroidism   . Renal disease   . Renal disorder   . TIA (transient ischemic attack) early 2000s   Past Surgical History:  Past Surgical History:  Procedure Laterality Date  . ABDOMINAL  EXPLORATION SURGERY  7/  . APPENDECTOMY    . CATARACT EXTRACTION W/ INTRAOCULAR LENS  IMPLANT, BILATERAL Bilateral   . COLECTOMY  1950s   "thought he had iteilis"  . ESOPHAGOGASTRODUODENOSCOPY N/A 10/22/2017   Procedure: ESOPHAGOGASTRODUODENOSCOPY (EGD);  Surgeon: Gatha Mayer, MD;  Location: [facility] ENDOSCOPY;  Service: Endoscopy;  Laterality: N/A;  . ESOPHAGOGASTRODUODENOSCOPY (EGD) WITH PROPOFOL N/A 10/16/2017   Procedure: ESOPHAGOGASTRODUODENOSCOPY (EGD) WITH PROPOFOL;  Surgeon: Milus Banister, MD;  Location: [facility] ENDOSCOPY;  Service: Endoscopy;  Laterality: N/A;  . FEMUR FRACTURE SURGERY Right 1950s  . FRACTURE SURGERY    . KNEE ARTHROSCOPY Left   . POSTERIOR LUMBAR FUSION    . ROTATOR CUFF REPAIR Left   . TONSILLECTOMY      Assessment / Plan / Recommendation Clinical Impression   [name] is an 83 year old right-handed male with history of mild dementia, previous CVA, BPH, CKD stage III, multiple colon resections due to Crohn's disease.  History taken from chart review and patient. He is a resident of [facility] assisted living facility.  He was able to complete his own bathing, dressing and walks to the dining hall for his meals using a rolling walker.  He does not drive.  Presented 04/03/2018 with altered mental status.  There was reports of dizziness.  Cranial CT scan reviewed, unremarkable for acute intracranial process. MRI showed small areas of acute infarction affecting the posterior right basal ganglia and radiating white matter tracts on the right.  No swelling or  hemorrhage.  Extensive chronic small vessel ischemic changes.  Patient did not receive TPA.  Carotid Dopplers with no ICA stenosis.  Echocardiogram with ejection fraction of 60% no wall motion abnormalities.  No defect or PFO noted..  Maintained on aspirin as well as Plavix for CVA prophylaxis.  Subcutaneous heparin for DVT prophylaxis.  A follow-up chest x-ray 04/05/2018 for cough showed mild bibasilar subsegmental  atelectasis.  Tolerating a regular diet.  On 04/05/2018 noted some increased lethargy a follow-up cranial CT scan was completed as well as EEG that showed no seizure and scan showed expected evolution of acute subacute infarcts involving the right corona radiata and basal ganglia.  No hemorrhage or other acute abnormality.  Patient with elevated WBCs at 20,600 as well as creatinine mildly elevated 1.33 04/05/2018.  A follow-up urinalysis study positive nitrite with large leukocytes 04/05/2018 with culture pending and placed on empiric Rocephin and repeat WBC of 13,700 as well as creatinine 1.20.  Physical Occupational Therapy evaluations completed with recommendations of physical medicine rehab consult.  Patient was admitted for a comprehensive rehab program Pt presents with cognitive deficits characterized by decreased sustained attention to tasks, decreased safety awareness, decreased storage and retrieval of information, and decreased functional problem solving.  At time of evaluation, pt was oriented to himself only.  As a result, pt currently needs max assist to complete functional tasks safely.  Given the abovementioned deficits, pt would benefit from skilled ST while inpatient in order to maximize functional independence and reduce burden of care prior to discharge.  Anticipate that pt will need 24/7 supervision at discharge in addition to [facility] follow up at next level of care.      Skilled Therapeutic Interventions          Cognitive-linguistic evaluation completed with results and recommendations reviewed with patient.     SLP Assessment  Patient will need skilled [facility] during CIR admission    Recommendations  Recommendations for Other Services: Neuropsych consult Patient destination: Assisted Living Follow up Recommendations: Home Health SLP;24 hour supervision/assistance;Outpatient SLP Equipment Recommended: None recommended by SLP    SLP Frequency 3 to 5 out of 7 days    SLP Duration  SLP Intensity  SLP Treatment/Interventions 14 days   Minumum of 1-2 x/day, 30 to 90 minutes  Cognitive remediation/compensation;Cueing hierarchy;Functional tasks;Patient/family education;Internal/external aids;Environmental controls    Pain Pain Assessment Pain Scale: 0-10 Pain Score: 0-No pain  Prior Functioning Cognitive/Linguistic Baseline: Baseline deficits Type of Home: Assisted living  Lives With: Other (Comment)(ALF) Available Help at Discharge: Family;Available PRN/intermittently  Function:  Eating Eating                 Cognition Comprehension Comprehension assist level: Understands basic 90% of the time/cues < 10% of the time  Expression   Expression assist level: Expresses basic 50 - 74% of the time/requires cueing 25 - 49% of the time. Needs to repeat parts of sentences.  Social Interaction Social Interaction assist level: Interacts appropriately 25 - 49% of time - Needs frequent redirection.  Problem Solving Problem solving assist level: Solves basic 25 - 49% of the time - needs direction more than half the time to initiate, plan or complete simple activities  Memory Memory assist level: Recognizes or recalls 25 - 49% of the time/requires cueing 50 - 75% of the time   Short Term Goals: Week 1: SLP Short Term Goal 1 (Week 1): Pt will sustain hiis attention to functional tasks for 10 minute intervals with  mod verbal cues for redirection.  SLP Short Term Goal 2 (Week 1): Pt will complete basic familiar tasks with mod assist verbal cues for functional problem solving.  SLP Short Term Goal 3 (Week 1): Pt will recall daily information with mod assist verbal cues for use of external aids.  SLP Short Term Goal 4 (Week 1): Pt will return demonstration of at least 2 safety precautions during functional tasks with mod assist verbal cues.   Refer to Care Plan for Long Term Goals  Recommendations for other services: Neuropsych  Discharge Criteria:  Patient will be discharged from SLP if patient refuses treatment 3 consecutive times without medical reason, if treatment goals not met, if there is a change in medical status, if patient makes no progress towards goals or if patient is discharged from hospital.  The above assessment, treatment plan, treatment alternatives and goals were discussed and mutually agreed upon: No family available/patient unable  Emilio Math 04/08/2018, 6:45 AM

## 2018-04-08 NOTE — Plan of Care (Signed)
  Problem: RH BLADDER ELIMINATION Goal: RH STG MANAGE BLADDER WITH ASSISTANCE Description STG Manage Bladder With Assistance. Mod I  Outcome: Progressing  Incont episodes will assist with bladder program   Problem: RH SAFETY Goal: RH STG ADHERE TO SAFETY PRECAUTIONS W/ASSISTANCE/DEVICE Description STG Adhere to Safety Precautions With Assistance/Device. Min  Outcome: Progressing  Continue to wear proper footwear, call light within reach.

## 2018-04-08 NOTE — Progress Notes (Signed)
Occupational Therapy Session Note  Patient Details  Name: Anthony Hayes MRN: [id number] Date of Birth: [birth year]  Today's Date: 04/08/2018 OT Individual Time: 1100-1200 OT Individual Time Calculation (min): 60 min    Short Term Goals: Week 1:  OT Short Term Goal 1 (Week 1): Pt will complete stand pivot transfer with mod A using LRAD OT Short Term Goal 2 (Week 1): Pt will don shirt with set-up/ VCs OT Short Term Goal 3 (Week 1): Pt will don shoes/socks with min A using AE PRN OT Short Term Goal 4 (Week 1): Pt will complete 2/3 toileting tasks with steadying assist OT Short Term Goal 5 (Week 1): Pt will complete 1 grooming task standing at sink with steadying assist in order to increase functional activity tolerance  Skilled Therapeutic Interventions/Progress Updates:    Pt seen for OT ADL bathing/dressing session. Pt sitting up in w/c upon arrival, disoriented to place and situation. He was able to state he was in "hospital room" but not aware of city or reason for being in hospital. Pt agreeable to shower this morning. Completed stand pivot transfers w/c <> tub bench with use of grab bars. Required mod A for controlled descent and VCs with max A for standing balance when entering shower as pt crossing legs when attempting to side step in. He bathed seated on tub bench, min A to use L UE in functional manner as pt intended. Pt L hand dominant and with decreased awareness into deficits attempts to cont to use L UE at dominant level. He attempted to stand without assist from therapist, ultimately required B UE support on grab bar while therapist provided total A for buttock hygpene. He returned to w/c to dress, increased time for problem solving clothing orientation and donning over apraxic L UE. Max A required for LB dressing. He stood at sink with min A for pants to be pulled up. Pt tolerated ~5 minutes standing at sink to shave, initially required max demonstration cues for use of electric razors and  then assist to terminate task. Pt left seated in w/c at end of session, QRB donned, chair alarm on and all needs in reach.  Pt oriented to place at end of session.   Therapy Documentation Precautions:  Precautions Precautions: Fall Precaution Comments: L side hemiplegia and ataxia Restrictions Weight Bearing Restrictions: No Pain: Pain Assessment Pain Scale: 0-10 Pain Score: 0-No pain  See Function Navigator for Current Functional Status.   Therapy/Group: Individual Therapy  Anthony Hayes L 04/08/2018, 7:07 AM

## 2018-04-09 ENCOUNTER — Inpatient Hospital Stay (HOSPITAL_COMMUNITY): Payer: Medicare Other | Admitting: Physical Therapy

## 2018-04-09 ENCOUNTER — Inpatient Hospital Stay (HOSPITAL_COMMUNITY): Payer: Medicare Other

## 2018-04-09 ENCOUNTER — Inpatient Hospital Stay (HOSPITAL_COMMUNITY): Payer: Medicare Other | Admitting: Occupational Therapy

## 2018-04-09 DIAGNOSIS — I10 Essential (primary) hypertension: Secondary | ICD-10-CM

## 2018-04-09 DIAGNOSIS — E871 Hypo-osmolality and hyponatremia: Secondary | ICD-10-CM

## 2018-04-09 DIAGNOSIS — E876 Hypokalemia: Secondary | ICD-10-CM

## 2018-04-09 DIAGNOSIS — N390 Urinary tract infection, site not specified: Secondary | ICD-10-CM

## 2018-04-09 DIAGNOSIS — N400 Enlarged prostate without lower urinary tract symptoms: Secondary | ICD-10-CM

## 2018-04-09 DIAGNOSIS — D72829 Elevated white blood cell count, unspecified: Secondary | ICD-10-CM

## 2018-04-09 DIAGNOSIS — N183 Chronic kidney disease, stage 3 unspecified: Secondary | ICD-10-CM

## 2018-04-09 NOTE — Progress Notes (Signed)
Physical Therapy Session Note  Patient Details  Name: Anthony Hayes MRN: [id number] Date of Birth: 10/22/1935  Today's Date: 04/09/2018 PT Individual Time: 1345-1445   60 min   Short Term Goals: Week 1:  PT Short Term Goal 1 (Week 1): Pt will perform bed mobility with S PT Short Term Goal 2 (Week 1): Pt will perform stand pivot transfer minA PT Short Term Goal 3 (Week 1): Pt will ambulate x50' modA PT Short Term Goal 4 (Week 1): Pt will perform w/c propulsion x50' with minA  Skilled Therapeutic Interventions/Progress Updates:      Pt received supine in bed and agreeable to PT. Supine>sit transfer with  Mod assist  At trunk and min cues for technique.   Sitting balance EOB with supervision assist. Min cues for safety, while PT donned pt shoes. Squat pivot transfer with mod assist from PT.   Pt continues to have extremely poor awareness to situation and short term memory throughout treatment.   Pt transported in [facility] to day room. Stand pivot transfer to and from Nustep with min assist and moderate multimodal cues for gait pattern and RW management.   Nustep reciprocal movement training x 10 minutes at level 5. Min cues from PT for improved ROM and symmetry of movement.   Gait training with RW 38f x 2. Min-mod assist from PT with moderate cues for gait pattern for increased step width as well as improved hip flexion to prevent foot drag.   Stair training x 4 with mod assist to improved coordination of LLE, BUE support on rails throughout.   Pt returned to room and performed stand pivot transfer to bed with min assist and RW. Sit>supine completed with mod assist and left supine in bed with call bell in reach and all needs met.       Therapy Documentation Precautions:  Precautions Precautions: Fall Precaution Comments: L side hemiplegia and ataxia Restrictions Weight Bearing Restrictions: No   Pain:  denies   See Function Navigator for Current Functional  Status.   Therapy/Group: Individual Therapy  ALorie Phenix4/13/2019, 2:00 PM

## 2018-04-09 NOTE — Progress Notes (Signed)
Speech Language Pathology Daily Session Note  Patient Details  Name: Anthony Hayes MRN: [id number] Date of Birth: 07/18/1935  Today's Date: 04/09/2018 SLP Individual Time: 1324-4010 SLP Individual Time Calculation (min): 45 min  Short Term Goals: Week 1: SLP Short Term Goal 1 (Week 1): Pt will sustain hiis attention to functional tasks for 10 minute intervals with mod verbal cues for redirection.  SLP Short Term Goal 2 (Week 1): Pt will complete basic familiar tasks with mod assist verbal cues for functional problem solving.  SLP Short Term Goal 3 (Week 1): Pt will recall daily information with mod assist verbal cues for use of external aids.  SLP Short Term Goal 4 (Week 1): Pt will return demonstration of at least 2 safety precautions during functional tasks with mod assist verbal cues.   Skilled Therapeutic Interventions: Skilled ST services focused on cognitive skills. SLP facilitated recall of daily information viewing scheduling and creating simplified visual aid, pt required max A verbal cues to utilize visual aid thorough session to recall today's events . SLP facilitated basic problem solving with novel card game, Blink played it's simplest form, pt required mod A verbal cues for problem solving and recall of rules utilizing visual aid, pt's ability was further impacted by reduced sustained attention requiring max A verbal cues after initial 15 minutes. Pt began falling sleep.  Pt was left in chair with safety belt on and call bell within reach. Recommend to continue skilled ST services.      Function:  Eating Eating                 Cognition Comprehension Comprehension assist level: Understands basic 50 - 74% of the time/ requires cueing 25 - 49% of the time;Understands basic 75 - 89% of the time/ requires cueing 10 - 24% of the time  Expression   Expression assist level: Expresses basic 75 - 89% of the time/requires cueing 10 - 24% of the time. Needs helper to occlude  trach/needs to repeat words.;Expresses basic 90% of the time/requires cueing < 10% of the time.  Social Interaction Social Interaction assist level: Interacts appropriately 75 - 89% of the time - Needs redirection for appropriate language or to initiate interaction.  Problem Solving Problem solving assist level: Solves basic 50 - 74% of the time/requires cueing 25 - 49% of the time  Memory Memory assist level: Recognizes or recalls 25 - 49% of the time/requires cueing 50 - 75% of the time    Pain    Therapy/Group: Individual Therapy  Anthony Hayes  [facility] 04/09/2018, 2:46 PM

## 2018-04-09 NOTE — Progress Notes (Signed)
Subjective/Complaints: Patient seen lying in bed this morning. He states he slept well overnight. He keeps his eyes closed and provides limited interaction.  review of systems: Denies chest pain, shortness of breath, nausea, vomiting, diarrhea  Objective: Vital Signs: Blood pressure 127/80, pulse (!) 57, temperature 98.3 F (36.8 C), temperature source Oral, resp. rate 18, height 5' 7"  (1.702 m), weight 75.6 kg (166 lb 10.7 oz), SpO2 95 %. No results found. Results for orders placed or performed during the hospital encounter of 04/06/18 (from the past 72 hour(s))  CBC WITH DIFFERENTIAL     Status: Abnormal   Collection Time: 04/07/18  7:25 AM  Result Value Ref Range   WBC 12.0 (H) 4.0 - 10.5 K/uL   RBC 4.59 4.22 - 5.81 MIL/uL   Hemoglobin 13.7 13.0 - 17.0 g/dL   HCT 40.8 39.0 - 52.0 %   MCV 88.9 78.0 - 100.0 fL   MCH 29.8 26.0 - 34.0 pg   MCHC 33.6 30.0 - 36.0 g/dL   RDW 15.4 11.5 - 15.5 %   Platelets 311 150 - 400 K/uL   Neutrophils Relative % 77 %   Neutro Abs 9.2 (H) 1.7 - 7.7 K/uL   Lymphocytes Relative 12 %   Lymphs Abs 1.5 0.7 - 4.0 K/uL   Monocytes Relative 8 %   Monocytes Absolute 1.0 0.1 - 1.0 K/uL   Eosinophils Relative 3 %   Eosinophils Absolute 0.4 0.0 - 0.7 K/uL   Basophils Relative 0 %   Basophils Absolute 0.0 0.0 - 0.1 K/uL    Comment: Performed at [facility], [address], [place], [place] [address]  Comprehensive metabolic panel     Status: Abnormal   Collection Time: 04/07/18  7:25 AM  Result Value Ref Range   Sodium 134 (L) 135 - 145 mmol/L   Potassium 3.8 3.5 - 5.1 mmol/L   Chloride 101 101 - 111 mmol/L   CO2 19 (L) 22 - 32 mmol/L   Glucose, Bld 112 (H) 65 - 99 mg/dL   BUN 19 6 - 20 mg/dL   Creatinine, Ser 1.20 0.61 - 1.24 mg/dL   Calcium 9.4 8.9 - 10.3 mg/dL   Total Protein 7.4 6.5 - 8.1 g/dL   Albumin 3.7 3.5 - 5.0 g/dL   AST 23 15 - 41 U/L   ALT 15 (L) 17 - 63 U/L   Alkaline Phosphatase 106 38 - 126 U/L   Total Bilirubin 0.7 0.3 -  1.2 mg/dL   GFR calc non Af Amer 55 (L) >60 mL/min   GFR calc Af Amer >60 >60 mL/min    Comment: (NOTE) The eGFR has been calculated using the CKD EPI equation. This calculation has not been validated in all clinical situations. eGFR's persistently <60 mL/min signify possible Chronic Kidney Disease.    Anion gap 14 5 - 15    Comment: Performed at [facility] [name] [place], [place] [address]     HEENT: normocephalic. Atraumatic. Cardio: RRR and no JVD Resp: CTA B/L and unlabored GI: BS positive and ND Skin:   Intact. Warm and dry. Neuro: sleepy. Motor 3/5 Left delt, Biceps , triceps and grip 3/5 HF, KE ADF  4/5 RUE (?Participation) Musc/Skel:  No edema or tenderness in extremities. Gen NAD, vital signs reviewed.  Assessment/Plan: 1. Functional deficits secondary to RIght BG infarction which require 3+ hours per day of interdisciplinary therapy in a comprehensive inpatient rehab setting. Physiatrist is providing close team supervision and  24 hour management of active medical problems listed below. Physiatrist and rehab team continue to assess barriers to discharge/monitor patient progress toward functional and medical goals. FIM: Function - Bathing Bathing activity did not occur: Refused Position: Shower Body parts bathed by patient: Right arm, Left arm, Chest, Abdomen, Front perineal area, Right upper leg, Left upper leg, Right lower leg, Left lower leg Body parts bathed by helper: Buttocks, Back Assist Level: Touching or steadying assistance(Pt > 75%)  Function- Upper Body Dressing/Undressing What is the patient wearing?: Pull over shirt/dress Pull over shirt/dress - Perfomed by patient: Thread/unthread right sleeve, Thread/unthread left sleeve, Pull shirt over trunk Pull over shirt/dress - Perfomed by helper: Put head through opening Function - Lower Body Dressing/Undressing What is the patient wearing?: Pants, Socks, Shoes Position: Wheelchair/chair at  sink Pants- Performed by helper: Thread/unthread right pants leg, Thread/unthread left pants leg, Pull pants up/down Socks - Performed by helper: Don/doff right sock, Don/doff left sock Shoes - Performed by helper: Don/doff right shoe, Don/doff left shoe, Fasten left, Fasten right Assist for footwear: Maximal assist  Function - Toileting Toileting steps completed by helper: Adjust clothing prior to toileting, Performs perineal hygiene, Adjust clothing after toileting Toileting Assistive Devices: Grab bar or rail  Function - Air cabin crew transfer assistive device: Elevated toilet seat/BSC over toilet, Grab bar Assist level to toilet: Maximal assist (Pt 25 - 49%/lift and lower) Assist level from toilet: Maximal assist (Pt 25 - 49%/lift and lower)  Function - Chair/bed transfer Chair/bed transfer method: Stand pivot Chair/bed transfer assist level: Maximal assist (Pt 25 - 49%/lift and lower) Chair/bed transfer assistive device: Armrests Chair/bed transfer details: Tactile cues for initiation, Tactile cues for sequencing, Tactile cues for weight shifting, Tactile cues for posture, Tactile cues for placement, Verbal cues for technique, Verbal cues for precautions/safety, Manual facilitation for weight shifting, Manual facilitation for placement  Function - Locomotion: Wheelchair Type: Manual Max wheelchair distance: 20 Assist Level: Maximal assistance (Pt 25 - 49%) Wheel 50 feet with 2 turns activity did not occur: Safety/medical concerns(fatigue) Wheel 150 feet activity did not occur: Safety/medical concerns Function - Locomotion: Ambulation Assistive device: Rail in hallway Max distance: 10 Assist level: Moderate assist (Pt 50 - 74%) Assist level: Moderate assist (Pt 50 - 74%) Walk 50 feet with 2 turns activity did not occur: Safety/medical concerns Walk 150 feet activity did not occur: Safety/medical concerns Walk 10 feet on uneven surfaces activity did not occur:  Safety/medical concerns  Function - Comprehension Comprehension: Auditory Comprehension assist level: Understands basic 50 - 74% of the time/ requires cueing 25 - 49% of the time, Understands basic 75 - 89% of the time/ requires cueing 10 - 24% of the time  Function - Expression Expression: Verbal Expression assist level: Expresses basic 75 - 89% of the time/requires cueing 10 - 24% of the time. Needs helper to occlude trach/needs to repeat words., Expresses basic 90% of the time/requires cueing < 10% of the time.  Function - Social Interaction Social Interaction assist level: Interacts appropriately 75 - 89% of the time - Needs redirection for appropriate language or to initiate interaction.  Function - Problem Solving Problem solving assist level: Solves basic 50 - 74% of the time/requires cueing 25 - 49% of the time, Solves basic 75 - 89% of the time/requires cueing 10 - 24% of the time  Function - Memory Memory assist level: Recognizes or recalls 25 - 49% of the time/requires cueing 50 - 75% of the time Patient normally able to  recall (first 3 days only): Current season, That he or she is in a hospital  Medical Problem List and Plan: 1.  Altered mental status with decreased functional mobility secondary to right basal ganglia infarction.  Plan aspirin and Plavix times 3 weeks then Plavix alone  Continue CIR 2.  DVT Prophylaxis/Anticoagulation: Subcutaneous heparin. 3. Pain Management: Tylenol as needed 4. Mood/mild dementia.  Tofranil 25 mg nightly, anticholinergic effect may worsen cognition d/ced tofranil, trial melatonin for sleep 5. Neuropsych: This patient is capable of making decisions on his own behalf. 6. Skin/Wound Care: Routine skin checks 7. Fluids/Electrolytes/Nutrition: Routine I/O's 8.  CKD stage III.     Creatinine 1.20 on 4/11   Continue to monitor 9.  Hypertension.  HCTZ 25 mg daily.  Monitor with increased mobility   Relatively controlled on 4/13 10.  History  of BPH.  Currently on both Flomax and Ditropan as prior to admission.     PVRs ordered 11.  UTI/leukocytosis.  Continue Rocephin as directed, urine culture shows Klebsiella,    Blood cultures x2 NGTD   WBCs 12.0 on 4/11   Afebrile   Continue to monitor 12.  Hyperlipidemia.  Pravachol 13.  Multiple colon resections due to Crohn's disease.  Continue to monitor 14.  Hypothyroidism.  TSH 4.601.Synthroid 15.  Vitamin B12 deficiency.  Continue vitamin B12 16.  Knee and hip contracture, amb with walker at home 17.  Hypokalemia supplementing with KCl 20 mEq/day   Potassium 3.8 on 4/11   Continue to monitor 18. Hyponatremia  Sodium 134 on 4/11  Continue to monitor  LOS (Days) 3 A FACE TO FACE EVALUATION WAS PERFORMED  Michae Grimley Lorie Phenix 04/09/2018, 8:07 AM

## 2018-04-09 NOTE — Progress Notes (Signed)
Occupational Therapy Session Note  Patient Details  Name: Anthony Hayes MRN: [id number] Date of Birth: 1935-12-13  Today's Date: 04/09/2018 OT Individual Time: 1135-1141 OT Individual Time Calculation (min): 6 min   Skilled Therapeutic Interventions/Progress Updates: Patient c/o fatigue and was limited to bed mobility, education and bed positioning.    Patient left in bed with alarms engaged.    Will offer therapy again next scheduled date.     Therapy Documentation Precautions:  Precautions Precautions: Fall Precaution Comments: L side hemiplegia and ataxia Restrictions Weight Bearing Restrictions: No General: General OT Amount of Missed Time: 24 Minutes(24)   Therapy/Group: Individual Therapy  Alfredia Ferguson [facility] 04/09/2018, 3:27 PM

## 2018-04-09 NOTE — Progress Notes (Signed)
Occupational Therapy Session Note  Patient Details  Name: Anthony Hayes MRN: [id number] Date of Birth: 01/10/1935  Today's Date: 04/09/2018 OT Individual Time: 6701-4103 OT Individual Time Calculation (min): 60 min    Short Term Goals: Week 1:  OT Short Term Goal 1 (Week 1): Pt will complete stand pivot transfer with mod A using LRAD OT Short Term Goal 2 (Week 1): Pt will don shirt with set-up/ VCs OT Short Term Goal 3 (Week 1): Pt will don shoes/socks with min A using AE PRN OT Short Term Goal 4 (Week 1): Pt will complete 2/3 toileting tasks with steadying assist OT Short Term Goal 5 (Week 1): Pt will complete 1 grooming task standing at sink with steadying assist in order to increase functional activity tolerance  Skilled Therapeutic Interventions/Progress Updates: patient participation today as follows:    Was able to complete stand pivot transfer bed to w/c this am with extra time to intitiate and utilize core strength for balance with Min A.   He could use more practice with this for safer, more balanced transfers.  Donned shirt with Min A to inititate and extra time to process the task  Completed 2/3 toileting tasks with Min A (could use more practice for safe stability and balance)  Due to fatigue brushed teeth seated today at sink with set up  Otherwise, patient completed transfer activity practice and endurance activities     Therapy Documentation Precautions:  Precautions Precautions: Fall Precaution Comments: L side hemiplegia and ataxia Restrictions Weight Bearing Restrictions: No Pain: Pain Assessment Pain Scale: 0-10 Pain Score: 0-No pain    See Function Navigator for Current Functional Status.   Therapy/Group: Individual Therapy  Alfredia Ferguson [facility] 04/09/2018, 12:03 PM

## 2018-04-10 ENCOUNTER — Inpatient Hospital Stay (HOSPITAL_COMMUNITY): Payer: Medicare Other

## 2018-04-10 DIAGNOSIS — G479 Sleep disorder, unspecified: Secondary | ICD-10-CM

## 2018-04-10 LAB — CULTURE, BLOOD (ROUTINE X 2)
Culture: NO GROWTH
Culture: NO GROWTH
SPECIAL REQUESTS: ADEQUATE

## 2018-04-10 NOTE — Progress Notes (Signed)
Occupational Therapy Session Note  Patient Details  Name: Anthony Hayes MRN: [id number] Date of Birth: 1935-04-26  Today's Date: 04/10/2018 OT Individual Time: 1300-1400 OT Individual Time Calculation (min): 60 min    Short Term Goals: Week 1:  OT Short Term Goal 1 (Week 1): Pt will complete stand pivot transfer with mod A using LRAD OT Short Term Goal 2 (Week 1): Pt will don shirt with set-up/ VCs OT Short Term Goal 3 (Week 1): Pt will don shoes/socks with min A using AE PRN OT Short Term Goal 4 (Week 1): Pt will complete 2/3 toileting tasks with steadying assist OT Short Term Goal 5 (Week 1): Pt will complete 1 grooming task standing at sink with steadying assist in order to increase functional activity tolerance  Skilled Therapeutic Interventions/Progress Updates:    1:1. Pt sitting EOB with family present for session attempting to eat meal. Pt transfers to w/c with touching A for stand pivot EOB>w/c with VC for hand placement. Pt eats with red foam handle and improved control of LUE to self feed. Pt washes face and arm pits with VC for terminating washing face and running water.  Pt dresses at sit to stand level with supervision for UB dressing and steadying A while standing to advance pants past hips. Pt able to assume seated figure 4 for donning socks. Pt requires A to don L shoe. As pt bending down to tie shoe, pt abruptly jumps up and begins to try to walk to the bathroom without letting OT know of urgency. OT provides MOD-MAX A and Vc for safety/use of grab bars to ambulate/transfer onto toilet. OT provided total A for toileting d/t urgency. Pt already incontinent of bowel in brief and continues to habe B&B in toilet. OT educates on importance of communicating urgency, use of LRAD for transfers/walking, and need for A to get up. Exited session with pt returned to w/c, call light in reach and all needs met.   Therapy Documentation Precautions:  Precautions Precautions: Fall Precaution  Comments: L side hemiplegia and ataxia Restrictions Weight Bearing Restrictions: No  See Function Navigator for Current Functional Status.   Therapy/Group: Individual Therapy  Tonny Branch 04/10/2018, 5:12 PM

## 2018-04-10 NOTE — Progress Notes (Signed)
Subjective/Complaints: Patient seen lying in bed this morning. He states he did not sleep well overnight. He states he had a "hard night". He does not expand, despite repeated attempts for further information. He keeps eyes closed and provide limited interaction.  Review of systems: limited due to behavior, however denies chest pain, shortness of breath, nausea, vomiting, diarrhea  Objective: Vital Signs: Blood pressure 133/67, pulse (!) 47, temperature 97.7 F (36.5 C), temperature source Oral, resp. rate 18, height 5' 7"  (1.702 m), weight 75.6 kg (166 lb 10.7 oz), SpO2 95 %. No results found. No results found for this or any previous visit (from the past 72 hour(s)).   HEENT: normocephalic. Atraumatic. Cardio: RRR and no JVD Resp: CTA B/L and unlabored GI: BS positive and ND Skin:   Intact. Warm and dry. Neuro: sleepy. Motor 3/5 Left delt, Biceps , triceps and grip (stable,? Participation) 3/5 HF, KE ADF (stable,? Participation) 4/5 RUE (?Participation) Musc/Skel:  No edema or tenderness in extremities. Gen NAD, vital signs reviewed. Psychiatric: Withdrawn  Assessment/Plan: 1. Functional deficits secondary to RIght BG infarction which require 3+ hours per day of interdisciplinary therapy in a comprehensive inpatient rehab setting. Physiatrist is providing close team supervision and 24 hour management of active medical problems listed below. Physiatrist and rehab team continue to assess barriers to discharge/monitor patient progress toward functional and medical goals. FIM: Function - Bathing Bathing activity did not occur: Refused Position: Shower Body parts bathed by patient: Right arm, Left arm, Chest, Abdomen, Front perineal area, Right upper leg, Left upper leg, Right lower leg, Left lower leg Body parts bathed by helper: Buttocks, Back Assist Level: Touching or steadying assistance(Pt > 75%)  Function- Upper Body Dressing/Undressing What is the patient wearing?: Pull  over shirt/dress Pull over shirt/dress - Perfomed by patient: Thread/unthread right sleeve, Thread/unthread left sleeve, Pull shirt over trunk Pull over shirt/dress - Perfomed by helper: Put head through opening Function - Lower Body Dressing/Undressing What is the patient wearing?: Pants, Socks, Shoes Position: Wheelchair/chair at sink Pants- Performed by helper: Thread/unthread right pants leg, Thread/unthread left pants leg, Pull pants up/down Socks - Performed by helper: Don/doff right sock, Don/doff left sock Shoes - Performed by helper: Don/doff right shoe, Don/doff left shoe, Fasten left, Fasten right Assist for footwear: Maximal assist  Function - Toileting Toileting steps completed by helper: Adjust clothing prior to toileting, Performs perineal hygiene, Adjust clothing after toileting Toileting Assistive Devices: Grab bar or rail  Function - Air cabin crew transfer assistive device: Elevated toilet seat/BSC over toilet, Grab bar Assist level to toilet: Maximal assist (Pt 25 - 49%/lift and lower) Assist level from toilet: Maximal assist (Pt 25 - 49%/lift and lower)  Function - Chair/bed transfer Chair/bed transfer method: Stand pivot Chair/bed transfer assist level: Maximal assist (Pt 25 - 49%/lift and lower) Chair/bed transfer assistive device: Armrests Chair/bed transfer details: Tactile cues for initiation, Tactile cues for sequencing, Tactile cues for weight shifting, Tactile cues for posture, Tactile cues for placement, Verbal cues for technique, Verbal cues for precautions/safety, Manual facilitation for weight shifting, Manual facilitation for placement  Function - Locomotion: Wheelchair Type: Manual Max wheelchair distance: 20 Assist Level: Maximal assistance (Pt 25 - 49%) Wheel 50 feet with 2 turns activity did not occur: Safety/medical concerns(fatigue) Wheel 150 feet activity did not occur: Safety/medical concerns Function - Locomotion:  Ambulation Assistive device: Rail in hallway Max distance: 10 Assist level: Moderate assist (Pt 50 - 74%) Assist level: Moderate assist (Pt 50 - 74%) Walk 50 feet  with 2 turns activity did not occur: Safety/medical concerns Walk 150 feet activity did not occur: Safety/medical concerns Walk 10 feet on uneven surfaces activity did not occur: Safety/medical concerns  Function - Comprehension Comprehension: Auditory Comprehension assist level: Understands basic 50 - 74% of the time/ requires cueing 25 - 49% of the time  Function - Expression Expression: Verbal Expression assist level: Expresses basic 75 - 89% of the time/requires cueing 10 - 24% of the time. Needs helper to occlude trach/needs to repeat words.  Function - Social Interaction Social Interaction assist level: Interacts appropriately 75 - 89% of the time - Needs redirection for appropriate language or to initiate interaction.  Function - Problem Solving Problem solving assist level: Solves basic 25 - 49% of the time - needs direction more than half the time to initiate, plan or complete simple activities  Function - Memory Memory assist level: Recognizes or recalls less than 25% of the time/requires cueing greater than 75% of the time Patient normally able to recall (first 3 days only): None of the above  Medical Problem List and Plan: 1.  Altered mental status with decreased functional mobility secondary to right basal ganglia infarction.  Plan aspirin and Plavix times 3 weeks then Plavix alone  Continue CIR 2.  DVT Prophylaxis/Anticoagulation: Subcutaneous heparin. 3. Pain Management: Tylenol as needed 4. Mood/mild dementia.  Tofranil 25 mg nightly, anticholinergic effect may worsen cognition d/ced tofranil, trial melatonin for sleep 5. Neuropsych: This patient is capable of making decisions on his own behalf. 6. Skin/Wound Care: Routine skin checks 7. Fluids/Electrolytes/Nutrition: Routine I/O's 8.  CKD stage III.      Creatinine 1.20 on 4/11   Continue to monitor 9.  Hypertension.  HCTZ 25 mg daily.  Monitor with increased mobility   Relatively controlled on 4/14 10.  History of BPH.  Currently on both Flomax and Ditropan as prior to admission.     PVRs ordered 11.  UTI/leukocytosis.  Continue Rocephin as directed, urine culture shows Klebsiella,    Blood cultures x2 NGTD on 4/14   WBCs 12.0 on 4/11   Afebrile   Continue to monitor 12.  Hyperlipidemia.  Pravachol 13.  Multiple colon resections due to Crohn's disease.  Continue to monitor 14.  Hypothyroidism.  TSH 4.601.Synthroid 15.  Vitamin B12 deficiency.  Continue vitamin B12 16.  Knee and hip contracture, amb with walker at home 17.  Hypokalemia supplementing with KCl 20 mEq/day   Potassium 3.8 on 4/11   Continue to monitor 18. Hyponatremia  Sodium 134 on 4/11  Continue to monitor  LOS (Days) 4 A FACE TO FACE EVALUATION WAS PERFORMED  Dinesha Twiggs Lorie Phenix 04/10/2018, 7:31 AM

## 2018-04-11 ENCOUNTER — Inpatient Hospital Stay (HOSPITAL_COMMUNITY): Payer: Medicare Other

## 2018-04-11 ENCOUNTER — Inpatient Hospital Stay (HOSPITAL_COMMUNITY): Payer: Medicare Other | Admitting: Physical Therapy

## 2018-04-11 ENCOUNTER — Inpatient Hospital Stay (HOSPITAL_COMMUNITY): Payer: Medicare Other | Admitting: Occupational Therapy

## 2018-04-11 MED ORDER — CEPHALEXIN 250 MG PO CAPS
250.0000 mg | ORAL_CAPSULE | Freq: Three times a day (TID) | ORAL | Status: DC
  Administered 2018-04-11 – 2018-04-20 (×28): 250 mg via ORAL
  Filled 2018-04-11 (×28): qty 1

## 2018-04-11 MED ORDER — LEVOTHYROXINE SODIUM 50 MCG PO TABS
50.0000 ug | ORAL_TABLET | Freq: Every day | ORAL | Status: DC
  Administered 2018-04-12 – 2018-04-22 (×11): 50 ug via ORAL
  Filled 2018-04-11 (×12): qty 1

## 2018-04-11 NOTE — Progress Notes (Signed)
Subjective/Complaints: Reviewed Urine micro  Review of systems: limited due to behavior, however denies chest pain, shortness of breath, nausea, vomiting, diarrhea  Objective: Vital Signs: Blood pressure 124/60, pulse (!) 55, temperature 97.8 F (36.6 C), temperature source Oral, resp. rate 16, height 5' 7"  (1.702 m), weight 75.6 kg (166 lb 10.7 oz), SpO2 99 %. No results found. No results found for this or any previous visit (from the past 72 hour(s)).   HEENT: normocephalic. Atraumatic. Cardio: RRR and no JVD Resp: CTA B/L and unlabored GI: BS positive and ND Skin:   Intact. Warm and dry. Neuro: sleepy. Motor 3/5 Left delt, Biceps , triceps and grip (stable,? Participation) 3/5 HF, KE ADF (stable,? Participation) 4/5 RUE (?Participation) Musc/Skel:  No edema or tenderness in extremities. Gen NAD, vital signs reviewed. Psychiatric: Withdrawn  Assessment/Plan: 1. Functional deficits secondary to RIght BG infarction which require 3+ hours per day of interdisciplinary therapy in a comprehensive inpatient rehab setting. Physiatrist is providing close team supervision and 24 hour management of active medical problems listed below. Physiatrist and rehab team continue to assess barriers to discharge/monitor patient progress toward functional and medical goals. FIM: Function - Bathing Bathing activity did not occur: Refused Position: Shower Body parts bathed by patient: Right arm, Left arm, Chest, Abdomen, Front perineal area, Right upper leg, Left upper leg, Right lower leg, Left lower leg Body parts bathed by helper: Buttocks, Back Assist Level: Touching or steadying assistance(Pt > 75%)  Function- Upper Body Dressing/Undressing What is the patient wearing?: Pull over shirt/dress Pull over shirt/dress - Perfomed by patient: Thread/unthread right sleeve, Thread/unthread left sleeve, Pull shirt over trunk Pull over shirt/dress - Perfomed by helper: Put head through  opening Function - Lower Body Dressing/Undressing What is the patient wearing?: Pants, Socks, Shoes Position: Wheelchair/chair at sink Pants- Performed by helper: Thread/unthread right pants leg, Thread/unthread left pants leg, Pull pants up/down Socks - Performed by helper: Don/doff right sock, Don/doff left sock Shoes - Performed by helper: Don/doff right shoe, Don/doff left shoe, Fasten left, Fasten right Assist for footwear: Maximal assist  Function - Toileting Toileting steps completed by helper: Adjust clothing prior to toileting, Performs perineal hygiene, Adjust clothing after toileting Toileting Assistive Devices: Grab bar or rail  Function - Air cabin crew transfer assistive device: Elevated toilet seat/BSC over toilet, Grab bar Assist level to toilet: Maximal assist (Pt 25 - 49%/lift and lower) Assist level from toilet: Maximal assist (Pt 25 - 49%/lift and lower)  Function - Chair/bed transfer Chair/bed transfer method: Stand pivot Chair/bed transfer assist level: Maximal assist (Pt 25 - 49%/lift and lower) Chair/bed transfer assistive device: Armrests Chair/bed transfer details: Tactile cues for initiation, Tactile cues for sequencing, Tactile cues for weight shifting, Tactile cues for posture, Tactile cues for placement, Verbal cues for technique, Verbal cues for precautions/safety, Manual facilitation for weight shifting, Manual facilitation for placement  Function - Locomotion: Wheelchair Type: Manual Max wheelchair distance: 20 Assist Level: Maximal assistance (Pt 25 - 49%) Wheel 50 feet with 2 turns activity did not occur: Safety/medical concerns(fatigue) Wheel 150 feet activity did not occur: Safety/medical concerns Function - Locomotion: Ambulation Assistive device: Rail in hallway Max distance: 10 Assist level: Moderate assist (Pt 50 - 74%) Assist level: Moderate assist (Pt 50 - 74%) Walk 50 feet with 2 turns activity did not occur: Safety/medical  concerns Walk 150 feet activity did not occur: Safety/medical concerns Walk 10 feet on uneven surfaces activity did not occur: Safety/medical concerns  Function - Comprehension Comprehension: Auditory Comprehension  assist level: Understands basic 50 - 74% of the time/ requires cueing 25 - 49% of the time  Function - Expression Expression: Verbal Expression assist level: Expresses basic 75 - 89% of the time/requires cueing 10 - 24% of the time. Needs helper to occlude trach/needs to repeat words.  Function - Social Interaction Social Interaction assist level: Interacts appropriately 75 - 89% of the time - Needs redirection for appropriate language or to initiate interaction.  Function - Problem Solving Problem solving assist level: Solves basic 25 - 49% of the time - needs direction more than half the time to initiate, plan or complete simple activities  Function - Memory Memory assist level: Recognizes or recalls less than 25% of the time/requires cueing greater than 75% of the time Patient normally able to recall (first 3 days only): None of the above  Medical Problem List and Plan: 1.  Altered mental status with decreased functional mobility secondary to right basal ganglia infarction.  Plan aspirin and Plavix times 3 weeks then Plavix alone  Continue CIR PT, OT, SLP 2.  DVT Prophylaxis/Anticoagulation: Subcutaneous heparin. 3. Pain Management: Tylenol as needed 4. Mood/mild dementia.  Tofranil 25 mg nightly, anticholinergic effect may worsen cognition d/ced tofranil, trial melatonin for sleep Will try to get baseline from family 5. Neuropsych: This patient is capable of making decisions on his own behalf.- need to eval cognition new vs old deficits 6. Skin/Wound Care: Routine skin checks 7. Fluids/Electrolytes/Nutrition: Routine I/O's 8.  CKD stage III.     Creatinine 1.20 on 4/11   Continue to monitor 9.  Hypertension.  HCTZ 25 mg daily.  Monitor with increased mobility     Vitals:   04/10/18 2027 04/11/18 0530  BP: 113/73 124/60  Pulse: 78 (!) 55  Resp:  16  Temp:  97.8 F (36.6 C)  SpO2: 100% 99%   10.  History of BPH.  Currently on both Flomax and Ditropan as prior to admission.     PVRs ordered 11.  UTI/leukocytosis.   urine culture shows Klebsiella, change to Keflex   Blood cultures x2 NGTD on 4/14   WBCs 12.0 on 4/11   Afebrile   Continue to monitor 12.  Hyperlipidemia.  Pravachol 13.  Multiple colon resections due to Crohn's disease.  Continue to monitor 14.  Hypothyroidism.  TSH 4.601.Synthroid, prior dose 0.60m, now on .233mwill resume .60m560mose 15.  Vitamin B12 deficiency.  Continue vitamin B12 16.  Knee and hip contracture, amb with walker at home 17.  Hypokalemia supplementing with KCl 20 mEq/day   Potassium 3.8 on 4/11   Continue to monitor 18. Hyponatremia  Sodium 134 on 4/11  Continue to monitor  LOS (Days) 5 A FACE TO FACE EVALUATION WAS PERFORMED  AndCharlett Blake15/2019, 7:50 AM

## 2018-04-11 NOTE — Progress Notes (Signed)
Speech Language Pathology Daily Session Note  Patient Details  Name: Anthony Hayes MRN: [id number] Date of Birth: 1935/07/22  Today's Date: 04/11/2018 SLP Individual Time: 1445-1530 SLP Individual Time Calculation (min): 45 min  Short Term Goals: Week 1: SLP Short Term Goal 1 (Week 1): Pt will sustain hiis attention to functional tasks for 10 minute intervals with mod verbal cues for redirection.  SLP Short Term Goal 2 (Week 1): Pt will complete basic familiar tasks with mod assist verbal cues for functional problem solving.  SLP Short Term Goal 3 (Week 1): Pt will recall daily information with mod assist verbal cues for use of external aids.  SLP Short Term Goal 4 (Week 1): Pt will return demonstration of at least 2 safety precautions during functional tasks with mod assist verbal cues.   Skilled Therapeutic Interventions:Skilled St services focused on cognitive skills. Pt's phone was ring upon entering room. Pt missed call and continued to speak into the phone until redirected. SLP educated how to locate missed calls and pt returned demonstration of 2 step direction to call daughter back with mod A verbal cues. SLP facilitated basic problem solving utilizing 3 step sequence cards and identifying three differences among two cards at a time, pt required min A verbal cues for problem solving for 3 card sequence and max A verbal cues for identifying differences, likely due to deficits in working memory. Pt demonstrated increase in sustained attention requiring min A verbal cues in 10 minute intervals with an increase in alertness. Pt was left in bed with call bell within reach alarm set. Recommend to continue skilled ST services.     Function:  Eating Eating                 Cognition Comprehension Comprehension assist level: Understands basic 50 - 74% of the time/ requires cueing 25 - 49% of the time;Understands basic 75 - 89% of the time/ requires cueing 10 - 24% of the time  Expression    Expression assist level: Expresses basic 75 - 89% of the time/requires cueing 10 - 24% of the time. Needs helper to occlude trach/needs to repeat words.  Social Interaction Social Interaction assist level: Interacts appropriately 75 - 89% of the time - Needs redirection for appropriate language or to initiate interaction.  Problem Solving Problem solving assist level: Solves basic 25 - 49% of the time - needs direction more than half the time to initiate, plan or complete simple activities;Solves basic 75 - 89% of the time/requires cueing 10 - 24% of the time;Solves basic 50 - 74% of the time/requires cueing 25 - 49% of the time  Memory Memory assist level: Recognizes or recalls 25 - 49% of the time/requires cueing 50 - 75% of the time    Pain Pain Assessment Pain Score: 0-No pain  Therapy/Group: Individual Therapy  Debi Cousin  [facility] 04/11/2018, 3:32 PM

## 2018-04-11 NOTE — Progress Notes (Signed)
Occupational Therapy Session Note  Patient Details  Name: Costas Sena MRN: [id number] Date of Birth: 01-07-1935  Today's Date: 04/11/2018 OT Individual Time: 1100-1200 OT Individual Time Calculation (min): 60 min    Short Term Goals: Week 1:  OT Short Term Goal 1 (Week 1): Pt will complete stand pivot transfer with mod A using LRAD OT Short Term Goal 2 (Week 1): Pt will don shirt with set-up/ VCs OT Short Term Goal 3 (Week 1): Pt will don shoes/socks with min A using AE PRN OT Short Term Goal 4 (Week 1): Pt will complete 2/3 toileting tasks with steadying assist OT Short Term Goal 5 (Week 1): Pt will complete 1 grooming task standing at sink with steadying assist in order to increase functional activity tolerance  Skilled Therapeutic Interventions/Progress Updates:    Pt seen for OT session focusing on ADL re-training and functional transfers and endurance. Pt sitting up in w/c upon arrival, attempting to get back to bed. Pt re-oriented to place and situation, able to recall in "rehab facilitaty" and "April".  He declined bathing/dressing this morning. Practiced functional ambulation into bathrrom, requiring mod A to stand to RW and ambulatied with min-occasional mod A into bathroom required frequent cuing for proper RW management. Steadying assist during clothing management. He completed hand hygiene standing at sink and then needing sitting rest break before completing oral care. Pt able to brush teeth using L UE at dominant level with use of built-up handle on toothbrush. During oral care, he voiced urgent need for toileting and ambulating into bathroom and completed toileting task in same manner as described above, unable to void. Returned to w/c to complete UB dressing task, required significantly increased time to complete task, though able to correctly orient shirt independently and don.  He then completed fine motor activity practicing taking off and placing caps on small self-care  bottles. He required frequent cuing to use L hand to remove lid and becoming frustrated by therapist's directions for strengthening hand despite education and he was able to complete with slightly increased time. Meal tray arrived, and pt able to set-up all needed items mod I. Built up gripped placed on sliverware, pt able to self feed with L UE. Pt left seated in w/c at end of session, chair alarm and QRB donned, all needs in reach. Education provided for use of call bell.   Therapy Documentation Precautions:  Precautions Precautions: Fall Precaution Comments: L side hemiplegia and ataxia Restrictions Weight Bearing Restrictions: No Pain: Pain Assessment Pain Scale: 0-10 Pain Score: 0-No pain  See Function Navigator for Current Functional Status.   Therapy/Group: Individual Therapy  Alessandria Henken L 04/11/2018, 7:17 AM

## 2018-04-11 NOTE — Progress Notes (Signed)
Physical Therapy Session Note  Patient Details  Name: Anthony Hayes MRN: [id number] Date of Birth: November 12, 1935  Today's Date: 04/11/2018 PT Individual Time: 0802-0902 and 3300-7622 PT Individual Time Calculation (min): 60 min and 30 min  Short Term Goals: Week 1:  PT Short Term Goal 1 (Week 1): Pt will perform bed mobility with S PT Short Term Goal 2 (Week 1): Pt will perform stand pivot transfer minA PT Short Term Goal 3 (Week 1): Pt will ambulate x50' modA PT Short Term Goal 4 (Week 1): Pt will perform w/c propulsion x50' with minA  Skilled Therapeutic Interventions/Progress Updates:     Session 1: Pt received in bed sleeping, pt agreeable to therapy with encouragement and extended time. Pt denies pain. Pt transferred supine>sit EOB with min assist and verbal cuing for technique and sequencing. Pt lethargic throughout session and required extended time to don socks, total assistance to don shoes 2/2 time management. Pt demonstrated poor awareness to self and situation but able to choose correct location with choice of two options. Pt responds "I don't know" to all questions and requires encouragement to attempt to answer question. Pt transferred bed>w/c stand pivot with min assist for balance and verbal cuing for sequencing. Pt transported to gym via w/c total assist for time management. Pt ambulated w/ RW 55fx2 and 339f2 with mod assist and verbal cuing to increase step width, increase L toe clearance, and upright posture. Pt demonstrated an ataxic gait pattern with scissoring, L foot drag, absent bilateral heel strike, and a forward flexed posture. Pt performed w/c<>bed stand pivot with RW and min assist, verbal cuing for sequencing and hand placement. Pt performed standing toe taps to target on ground with no UE support and mod assist for balance to facilitate weight shifting and improve bilateral DF and hip flexion. Pt demonstrated decreased command following, decreased hip flexion and DF L>R,  and decreased weight shifting to L side. Pt engaged in standing ball toss with no UE support and mod assist to improve static standing balance. Pt demonstrated weakness of LUE, occasionally using RUE to support LUE. Pt propelled w/c with BLE for strengthening and endurance x 25 ft. At end of session, pt seated in w/c, alarm on, quick release belt on with breakfast set up by therapist.    Session 2: Pt received in bed, agreeable to therapy. Pt complains of back pain, RN alerted to administer pain medication. Pt transferred supine>sit EOB with use of bed rails and min assist. Therapist donned shoes total assist and pt transported to gym via w/c for time management. Pt engaged in horseshoe activity while standing on wedge for prolonged stretch of bilateral gastroc and heel cords and to improve dynamic standing balance and use of LUE. Pt encouraged to use only LUE but intermittently used RUE for support. Pt ambulated w/ RW 2556f with mod assist and verbal cuing for increased step width and upright posture. Pt propelled w/c 30 ft for BLE strengthening and endurance. At end of session, pt left seated in w/c, quick release belt on, chair alarm on, and all needs within reach.   Therapy Documentation Precautions:  Precautions Precautions: Fall Precaution Comments: L side hemiplegia and ataxia Restrictions Weight Bearing Restrictions: No   See Function Navigator for Current Functional Status.   Therapy/Group: Individual Therapy  LesCaffie Damme15/2019, 9:05 AM

## 2018-04-12 ENCOUNTER — Inpatient Hospital Stay (HOSPITAL_COMMUNITY): Payer: Medicare Other | Admitting: Physical Therapy

## 2018-04-12 ENCOUNTER — Inpatient Hospital Stay (HOSPITAL_COMMUNITY): Payer: Medicare Other

## 2018-04-12 ENCOUNTER — Inpatient Hospital Stay (HOSPITAL_COMMUNITY): Payer: Medicare Other | Admitting: Occupational Therapy

## 2018-04-12 NOTE — Progress Notes (Signed)
Occupational Therapy Session Note  Patient Details  Name: Anthony Hayes MRN: [id number] Date of Birth: Jun 27, 1935  Today's Date: 04/12/2018 OT Individual Time: 1305-1400 OT Individual Time Calculation (min): 55 min    Short Term Goals: Week 1:  OT Short Term Goal 1 (Week 1): Pt will complete stand pivot transfer with mod A using LRAD OT Short Term Goal 2 (Week 1): Pt will don shirt with set-up/ VCs OT Short Term Goal 3 (Week 1): Pt will don shoes/socks with min A using AE PRN OT Short Term Goal 4 (Week 1): Pt will complete 2/3 toileting tasks with steadying assist OT Short Term Goal 5 (Week 1): Pt will complete 1 grooming task standing at sink with steadying assist in order to increase functional activity tolerance  Skilled Therapeutic Interventions/Progress Updates:    Pt seen for OT ADL bathing/dressing session. Pt asleep in supine upon arrival, easily awoken, hwoever, taken consideraable time coming to EOB with max cuing for sequencing/technique and assist to find bedrail. Pt oriented x4 though voiced confusion regarding location and purpose of being here.  He required mod A to stand from EOB, and min progressing to mod A for ambulation with RW into bathroom due to poor dynamic standing balance and poor RW management skills. He bathed seated on tub bench. Required close sueprvision and VCs throughout as pt attemping to complete bathing task in standing demonstrating poor safety awareness and poor insight into deficits.  He dressed seated on toilet, requiring increased time for clothing orientation and management. Pt stood with steadying assist to pull pants up, socks donned sitting in figure four position with assist to obtain and maintain position with L LE.  Pt returned to supine at end of session, left with all needs in reach and bed alarm on.   Therapy Documentation Precautions:  Precautions Precautions: Fall Precaution Comments: L side hemiplegia and ataxia Restrictions Weight  Bearing Restrictions: No Pain: Pain Assessment Pain Scale: (P) 0-10 Pain Score: (P) 0-No pain  See Function Navigator for Current Functional Status.   Therapy/Group: Individual Therapy  Ellysia Char L 04/12/2018, 7:08 AM

## 2018-04-12 NOTE — Progress Notes (Signed)
Physical Therapy Session Note  Patient Details  Name: Anthony Hayes MRN: [id number] Date of Birth: 1935/01/17  Today's Date: 04/12/2018 PT Individual Time: 7654-6503 PT Individual Time Calculation (min): 59 min   Short Term Goals: Week 1:  PT Short Term Goal 1 (Week 1): Pt will perform bed mobility with S PT Short Term Goal 2 (Week 1): Pt will perform stand pivot transfer minA PT Short Term Goal 3 (Week 1): Pt will ambulate x50' modA PT Short Term Goal 4 (Week 1): Pt will perform w/c propulsion x50' with minA  Skilled Therapeutic Interventions/Progress Updates:   Pt received in w/c, agreeable to therapy. Pt with no complains of pain. Therapist propelled pt in w/c to day room for time management. Pt performed Kinetron in seated progressing to standing at 50cm/s for L NMR and to improve weight shifting. Pt required min assist, use of bilateral railings and verbal cuing for technique. Pt demonstrated decreased weight shifting to L side and reports his LLE feels "weak." Pt reports activity at medium level of difficulty. Pt ambulated w/ RW and visual cues on floor for increased bilateral step width and to improve coordination, requiring mod assist 71fx2. Pt demonstrated ataxic gait with  decreased L toe clearance, absent bilateral heel strike, and L trendelenburg during gait. Pt reports needing to void, transported to bathroom in w/c. Pt transferred chair<>BSC stand pivot with use of grab rails and min assist. Pt w/ incontinent void. Pt required assistance mod assist for standing balance and to change brief. Pt performed sit<>stand w/ RW and min assist on foam pad to improve static standing balance, progressing to dynamic balance and ball toss with mod assist for balance.  At end of session, pt left seated in w/c, quick release belt on, chair alarm on, and all needs within reach.   Therapy Documentation Precautions:  Precautions Precautions: Fall Precaution Comments: L side hemiplegia and  ataxia Restrictions Weight Bearing Restrictions: No  See Function Navigator for Current Functional Status.   Therapy/Group: Individual Therapy  LCaffie Damme4/16/2019, 12:06 PM

## 2018-04-12 NOTE — Progress Notes (Signed)
Speech Language Pathology Daily Session Note  Patient Details  Name: Anthony Hayes MRN: [id number] Date of Birth: 15-Jun-1935  Today's Date: 04/12/2018 SLP Individual Time: 3557-3220 SLP Individual Time Calculation (min): 45 min  Short Term Goals: Week 1: SLP Short Term Goal 1 (Week 1): Pt will sustain hiis attention to functional tasks for 10 minute intervals with mod verbal cues for redirection.  SLP Short Term Goal 2 (Week 1): Pt will complete basic familiar tasks with mod assist verbal cues for functional problem solving.  SLP Short Term Goal 3 (Week 1): Pt will recall daily information with mod assist verbal cues for use of external aids.  SLP Short Term Goal 4 (Week 1): Pt will return demonstration of at least 2 safety precautions during functional tasks with mod assist verbal cues.   Skilled Therapeutic Interventions:Skilled ST services focused on cognitive skills. Pt was extremely lethargic,began falling sleep requiring max A verbal/tatcile cues for sustained attention, therefore SLP brought pt in speech for a change of environment and pt required min A verbal cues for sustained attention with 15 minute intervals. SLP facilitated functional problem solving sequencing 4 step cards pt required initially max A verbal cues fading to mod A verba cues. SLP facilitated functional recall of today's schedule, in rewritten basic layout, pt required max A verbal cues fading to mod-min A to utilize visual aid with repetition. Pt was returned to room with call bell within reach. Reccomend to continue skilled ST services.      Function:  Eating Eating                 Cognition Comprehension Comprehension assist level: Understands basic 50 - 74% of the time/ requires cueing 25 - 49% of the time;Understands basic 75 - 89% of the time/ requires cueing 10 - 24% of the time  Expression   Expression assist level: Expresses basic 75 - 89% of the time/requires cueing 10 - 24% of the time. Needs helper  to occlude trach/needs to repeat words.  Social Interaction Social Interaction assist level: Interacts appropriately 75 - 89% of the time - Needs redirection for appropriate language or to initiate interaction.  Problem Solving Problem solving assist level: Solves basic 25 - 49% of the time - needs direction more than half the time to initiate, plan or complete simple activities;Solves basic 50 - 74% of the time/requires cueing 25 - 49% of the time  Memory Memory assist level: Recognizes or recalls 25 - 49% of the time/requires cueing 50 - 75% of the time;Recognizes or recalls 50 - 74% of the time/requires cueing 25 - 49% of the time    Pain Pain Assessment Pain Scale: 0-10 Pain Score: 0-No pain  Therapy/Group: Individual Therapy  Anthony Hayes  [facility] 04/12/2018, 4:45 PM

## 2018-04-12 NOTE — Progress Notes (Signed)
Subjective/Complaints:  Pt remebers me as MD and is orietned to [place] but cannot pick out hospital from list of 3 locations  Review of systems: limited due to behavior, however denies chest pain, shortness of breath, nausea, vomiting, diarrhea  Objective: Vital Signs: Blood pressure (P) 140/67, pulse (!) (P) 48, temperature (!) (P) 87.6 F (30.9 C), temperature source (P) Oral, resp. rate (P) 16, height 5' 7"  (1.702 m), weight 75.6 kg (166 lb 10.7 oz), SpO2 (P) 98 %. No results found. No results found for this or any previous visit (from the past 72 hour(s)).   HEENT: normocephalic. Atraumatic. Cardio: RRR and no JVD Resp: CTA B/L and unlabored GI: BS positive and ND Skin:   Intact. Warm and dry. Neuro: sleepy. Motor 3/5 Left delt, Biceps , triceps and grip (stable,? Participation) 3/5 HF, KE ADF (stable,? Participation) 4/5 RUE (?Participation) Musc/Skel:  No edema or tenderness in extremities. Gen NAD, vital signs reviewed. Psychiatric: Withdrawn  Assessment/Plan: 1. Functional deficits secondary to RIght BG infarction which require 3+ hours per day of interdisciplinary therapy in a comprehensive inpatient rehab setting. Physiatrist is providing close team supervision and 24 hour management of active medical problems listed below. Physiatrist and rehab team continue to assess barriers to discharge/monitor patient progress toward functional and medical goals. FIM: Function - Bathing Bathing activity did not occur: Refused Position: Shower Body parts bathed by patient: Right arm, Left arm, Chest, Abdomen, Front perineal area, Right upper leg, Left upper leg, Right lower leg, Left lower leg Body parts bathed by helper: Buttocks, Back Assist Level: Touching or steadying assistance(Pt > 75%)  Function- Upper Body Dressing/Undressing What is the patient wearing?: Pull over shirt/dress Pull over shirt/dress - Perfomed by patient: Thread/unthread right sleeve, Thread/unthread  left sleeve, Pull shirt over trunk, Put head through opening Pull over shirt/dress - Perfomed by helper: Put head through opening Assist Level: Supervision or verbal cues Function - Lower Body Dressing/Undressing What is the patient wearing?: Pants, Socks, Shoes Position: Wheelchair/chair at sink Pants- Performed by helper: Thread/unthread right pants leg, Thread/unthread left pants leg, Pull pants up/down Socks - Performed by helper: Don/doff right sock, Don/doff left sock Shoes - Performed by helper: Don/doff right shoe, Don/doff left shoe, Fasten left, Fasten right Assist for footwear: Maximal assist Assist for lower body dressing: (total assist)  Function - Toileting Toileting steps completed by patient: Adjust clothing after toileting, Adjust clothing prior to toileting Toileting steps completed by helper: Performs perineal hygiene Toileting Assistive Devices: Grab bar or rail Assist level: Touching or steadying assistance (Pt.75%)  Function - Air cabin crew transfer activity did not occur: Safety/medical concerns Toilet transfer assistive device: Elevated toilet seat/BSC over toilet, Grab bar, Walker Assist level to toilet: Moderate assist (Pt 50 - 74%/lift or lower) Assist level from toilet: Moderate assist (Pt 50 - 74%/lift or lower)  Function - Chair/bed transfer Chair/bed transfer method: Stand pivot Chair/bed transfer assist level: Touching or steadying assistance (Pt > 75%) Chair/bed transfer assistive device: Walker Chair/bed transfer details: Tactile cues for initiation, Tactile cues for sequencing, Tactile cues for weight shifting, Tactile cues for posture, Tactile cues for placement, Verbal cues for technique, Verbal cues for precautions/safety, Manual facilitation for weight shifting, Manual facilitation for placement  Function - Locomotion: Wheelchair Type: Manual Max wheelchair distance: 30  Assist Level: Touching or steadying assistance (Pt > 75%) Wheel  50 feet with 2 turns activity did not occur: Safety/medical concerns(fatigue) Wheel 150 feet activity did not occur: Safety/medical concerns Function - Locomotion: Ambulation  Assistive device: Walker-rolling Max distance: 25 Assist level: Moderate assist (Pt 50 - 74%) Assist level: Moderate assist (Pt 50 - 74%) Walk 50 feet with 2 turns activity did not occur: Safety/medical concerns Walk 150 feet activity did not occur: Safety/medical concerns Walk 10 feet on uneven surfaces activity did not occur: Safety/medical concerns  Function - Comprehension Comprehension: Auditory Comprehension assist level: Understands basic 50 - 74% of the time/ requires cueing 25 - 49% of the time, Understands basic 75 - 89% of the time/ requires cueing 10 - 24% of the time  Function - Expression Expression: Verbal Expression assist level: Expresses basic 75 - 89% of the time/requires cueing 10 - 24% of the time. Needs helper to occlude trach/needs to repeat words.  Function - Social Interaction Social Interaction assist level: Interacts appropriately 75 - 89% of the time - Needs redirection for appropriate language or to initiate interaction.  Function - Problem Solving Problem solving assist level: Solves basic 25 - 49% of the time - needs direction more than half the time to initiate, plan or complete simple activities, Solves basic 75 - 89% of the time/requires cueing 10 - 24% of the time, Solves basic 50 - 74% of the time/requires cueing 25 - 49% of the time  Function - Memory Memory assist level: Recognizes or recalls 25 - 49% of the time/requires cueing 50 - 75% of the time Patient normally able to recall (first 3 days only): None of the above  Medical Problem List and Plan: 1.  Altered mental status with decreased functional mobility secondary to right basal ganglia infarction.  Plan aspirin and Plavix times 3 weeks then Plavix alone  Continue CIR PT, OT, SLP team conf in am 2.  DVT  Prophylaxis/Anticoagulation: Subcutaneous heparin. 3. Pain Management: Tylenol as needed 4. Mood/mild dementia.  Tofranil 25 mg nightly, anticholinergic effect may worsen cognition d/ced tofranil, trial melatonin for sleep Will try to get baseline from family 5. Neuropsych: This patient is capable of making decisions on his own behalf.- Consult  To Neuropsych need to eval cognition new vs old deficits 6. Skin/Wound Care: Routine skin checks 7. Fluids/Electrolytes/Nutrition: Routine I/O's 8.  CKD stage III.     Creatinine 1.20 on 4/11   Continue to monitor 9.  Hypertension.  HCTZ 25 mg daily.  Monitor with increased mobility    Vitals:   04/12/18 0600 04/12/18 0610  BP: (P) 140/67   Pulse: (!) (P) 52 (!) (P) 48  Resp: (P) 16   Temp: (!) (P) 87.6 F (30.9 C)   SpO2: (P) 98%   Controlled 4/16 10.  History of BPH.  Currently on both Flomax and Ditropan as prior to admission.     PVRs ordered 11.  UTI/leukocytosis.   urine culture shows Klebsiella, change to Keflex   Blood cultures x2 NG final  on 4/15   WBCs 12.0 on 4/11   Afebrile   Continue to monitor 12.  Hyperlipidemia.  Pravachol 13.  Multiple colon resections due to Crohn's disease.  Continue to monitor 14.  Hypothyroidism.  TSH 4.601.Synthroid, prior dose 0.51m, now on .248mwill resume .74m674mose 15.  Vitamin B12 deficiency.  Continue vitamin B12 16.  Knee and hip contracture, amb with walker at home 17.  Hypokalemia supplementing with KCl 20 mEq/day   Potassium 3.8 on 4/11   Continue to monitor 18. Hyponatremia  Sodium 134 on 4/11  Continue to monitor 19.  Asymptomatic brady previously documented sinus brady on EKG ( also R and  L BBB) LOS (Days) 6 A FACE TO FACE EVALUATION WAS PERFORMED  Charlett Blake 04/12/2018, 7:40 AM

## 2018-04-12 NOTE — Discharge Instructions (Signed)
Inpatient Rehab Discharge Instructions  [facility] Discharge date and time: No discharge date for patient encounter.   Activities/Precautions/ Functional Status: Activity: activity as tolerated Diet: regular diet Wound Care: none needed Functional status:  ___ No restrictions     ___ Walk up steps independently ___ 24/7 supervision/assistance   ___ Walk up steps with assistance ___ Intermittent supervision/assistance  ___ Bathe/dress independently ___ Walk with walker     _x__ Bathe/dress with assistance ___ Walk Independently    ___ Shower independently ___ Walk with assistance    ___ Shower with assistance ___ No alcohol     ___ Return to work/school ________  Special Instructions: No smoking.  Continue aspirin and Plavix times 3 weeks then Plavix alone STROKE/TIA DISCHARGE INSTRUCTIONS SMOKING Cigarette smoking nearly doubles your risk of having a stroke & is the single most alterable risk factor  If you smoke or have smoked in the last 12 months, you are advised to quit smoking for your health.  Most of the excess cardiovascular risk related to smoking disappears within a year of stopping.  Ask you doctor about anti-smoking medications  [facility] Quit Line: 1-800-QUIT NOW  Free Smoking Cessation Classes [phone]  CHOLESTEROL Know your levels; limit fat & cholesterol in your diet  Lipid Panel     Component Value Date/Time   CHOL 155 04/04/2018 0608   TRIG 276 (H) 04/04/2018 0608   HDL 30 (L) 04/04/2018 0608   CHOLHDL 5.2 04/04/2018 0608   VLDL 55 (H) 04/04/2018 0608   LDLCALC 70 04/04/2018 0608      Many patients benefit from treatment even if their cholesterol is at goal.  Goal: Total Cholesterol (CHOL) less than 160  Goal:  Triglycerides (TRIG) less than 150  Goal:  HDL greater than 40  Goal:  LDL (LDLCALC) less than 100   BLOOD PRESSURE American Stroke Association blood pressure target is less that 120/80 mm/Hg  Your discharge blood pressure is:  BP: (!)  148/67  Monitor your blood pressure  Limit your salt and alcohol intake  Many individuals will require more than one medication for high blood pressure  DIABETES (A1c is a blood sugar average for last 3 months) Goal HGBA1c is under 7% (HBGA1c is blood sugar average for last 3 months)  Diabetes: No known diagnosis of diabetes    Lab Results  Component Value Date   HGBA1C 5.4 04/04/2018     Your HGBA1c can be lowered with medications, healthy diet, and exercise.  Check your blood sugar as directed by your physician  Call your physician if you experience unexplained or low blood sugars.  PHYSICAL ACTIVITY/REHABILITATION Goal is 30 minutes at least 4 days per week  Activity: Increase activity slowly, Therapies: Physical Therapy: Home Health Return to work:   Activity decreases your risk of heart attack and stroke and makes your heart stronger.  It helps control your weight and blood pressure; helps you relax and can improve your mood.  Participate in a regular exercise program.  Talk with your doctor about the best form of exercise for you (dancing, walking, swimming, cycling).  DIET/WEIGHT Goal is to maintain a healthy weight  Your discharge diet is: Fall precautions Diet Heart Room service appropriate? Yes; Fluid consistency: Thin  liquids Your height is:  Height: 5' 7"  (170.2 cm) Your current weight is: Weight: 75.6 kg (166 lb 10.7 oz) Your Body Mass Index (BMI) is:  BMI (Calculated): 26.1  Following the type of diet specifically designed for you will  help prevent another stroke.  Your goal weight range is:    Your goal Body Mass Index (BMI) is 19-24.  Healthy food habits can help reduce 3 risk factors for stroke:  High cholesterol, hypertension, and excess weight.  RESOURCES Stroke/Support Group:  Call [phone]   STROKE EDUCATION PROVIDED/REVIEWED AND GIVEN TO PATIENT Stroke warning signs and symptoms How to activate emergency medical system (call 911). Medications  prescribed at discharge. Need for follow-up after discharge. Personal risk factors for stroke. Pneumonia vaccine given:  Flu vaccine given:  My questions have been answered, the writing is legible, and I understand these instructions.  I will adhere to these goals & educational materials that have been provided to me after my discharge from the hospital.      My questions have been answered and I understand these instructions. I will adhere to these goals and the provided educational materials after my discharge from the hospital.  Patient/Caregiver Signature _______________________________ Date __________  Clinician Signature _______________________________________ Date __________  Please bring this form and your medication list with you to all your follow-up doctor's appointments.

## 2018-04-12 NOTE — Progress Notes (Signed)
Physical Therapy Session Note  Patient Details  Name: Anthony Hayes MRN: [id number] Date of Birth: 1935/12/07  Today's Date: 04/12/2018 PT Individual Time: 9641-8937 PT Individual Time Calculation (min): 27 min   Short Term Goals: Week 1:  PT Short Term Goal 1 (Week 1): Pt will perform bed mobility with S PT Short Term Goal 2 (Week 1): Pt will perform stand pivot transfer minA PT Short Term Goal 3 (Week 1): Pt will ambulate x50' modA PT Short Term Goal 4 (Week 1): Pt will perform w/c propulsion x50' with minA  Skilled Therapeutic Interventions/Progress Updates: Pt presented in bed with nsg student present assessing vitals (WNL) and pt agreeable to therapy, denies pain. Pt performed supine to sit with min assist for truncal support and use of features. Pt required min verbal cues for scooting to EOB and performed stand pivot transfer to w/c min guard. Pt awaiting administering meds therefore practiced sit to/from stand with RW from w/c x 5 with continuous cues for hand placement. Pt performed heel rises x 10 and standing SLR with RW x 10 for forced use of LLE. After receiving meds, pt ambulated approx 40 ft with RW and w/c follow.. Pt required mod cues for staying within RW and increasing BOS with placement of LLE due to stron adduction/near scissoring gait. Pt returned to w/c and returned to room with quick release belt placed, call bell within reach and needs met.      Therapy Documentation Precautions:  Precautions Precautions: Fall Precaution Comments: L side hemiplegia and ataxia Restrictions Weight Bearing Restrictions: No See Function Navigator for Current Functional Status.   Therapy/Group: Individual Therapy  Anthony Hayes  Anthony Hayes, PTA  04/12/2018, 1:48 PM

## 2018-04-13 ENCOUNTER — Inpatient Hospital Stay (HOSPITAL_COMMUNITY): Payer: Medicare Other | Admitting: Physical Therapy

## 2018-04-13 ENCOUNTER — Inpatient Hospital Stay (HOSPITAL_COMMUNITY): Payer: Medicare Other | Admitting: Occupational Therapy

## 2018-04-13 ENCOUNTER — Inpatient Hospital Stay (HOSPITAL_COMMUNITY): Payer: Medicare Other | Admitting: Speech Pathology

## 2018-04-13 NOTE — Progress Notes (Signed)
Speech Language Pathology Daily Session Note  Patient Details  Name: Anthony Hayes MRN: [id number] Date of Birth: 04-17-1935  Today's Date: 04/13/2018 SLP Individual Time: 0830-0930 SLP Individual Time Calculation (min): 60 min  Short Term Goals: Week 1: SLP Short Term Goal 1 (Week 1): Pt will sustain hiis attention to functional tasks for 10 minute intervals with mod verbal cues for redirection.  SLP Short Term Goal 2 (Week 1): Pt will complete basic familiar tasks with mod assist verbal cues for functional problem solving.  SLP Short Term Goal 3 (Week 1): Pt will recall daily information with mod assist verbal cues for use of external aids.  SLP Short Term Goal 4 (Week 1): Pt will return demonstration of at least 2 safety precautions during functional tasks with mod assist verbal cues.   Skilled Therapeutic Interventions: Skilled treatment session focused on cognition goals. SLP facilltated session within functional content of breakfast tray. Pt with appropriate problem solving but required Mod A cues for task initiation and for redirection to task as his door was open. Reviewed pt's schedule with him and he asked appropriate questions related to therapy and activities within each therapy. Pt was left upright in wheelchair, safety belt donned and all needs within reach. Continue per current plan of care.      Function:    Cognition Comprehension Comprehension assist level: Understands basic 75 - 89% of the time/ requires cueing 10 - 24% of the time  Expression   Expression assist level: Expresses basic 75 - 89% of the time/requires cueing 10 - 24% of the time. Needs helper to occlude trach/needs to repeat words.  Social Interaction Social Interaction assist level: Interacts appropriately 75 - 89% of the time - Needs redirection for appropriate language or to initiate interaction.  Problem Solving Problem solving assist level: Solves basic 50 - 74% of the time/requires cueing 25 - 49% of the  time  Memory Memory assist level: Recognizes or recalls 50 - 74% of the time/requires cueing 25 - 49% of the time    Pain    Therapy/Group: Individual Therapy  Ileigh Mettler 04/13/2018, 9:19 AM

## 2018-04-13 NOTE — Progress Notes (Signed)
Social Work Patient ID: Anthony Hayes, male   DOB: [birth year], 83 y.o.   MRN: [id number]  Spoke with Whitney-daughter to discuss team conference goals supervision-min assist level and target discharge date 4/26. Also contacted Michele-[facility] to inform of discharge target and goals. Both need to discuss what ALF can provide and what the options are. Discussed SNF for another few weeks and then return to ALF. Will all work together on the best option for pt. Whitney to talk with sister and ALF and get back with this worker. Will work on best option for pt.

## 2018-04-13 NOTE — Progress Notes (Signed)
Subjective/Complaints:  Pt remains confused but is oriented to hospital  Review of systems: limited due to behavior, however denies chest pain, shortness of breath, nausea, vomiting, diarrhea  Objective: Vital Signs: Blood pressure 121/82, pulse (!) 58, temperature 98.1 F (36.7 C), temperature source Oral, resp. rate 16, height 5' 7"  (1.702 m), weight 75.6 kg (166 lb 10.7 oz), SpO2 96 %. No results found. No results found for this or any previous visit (from the past 72 hour(s)).   HEENT: normocephalic. Atraumatic. Cardio: RRR and no JVD Resp: CTA B/L and unlabored GI: BS positive and ND Skin:   Intact. Warm and dry. Neuro: sleepy. Motor 3+/5 Left delt, Biceps , triceps and grip (stable,? Participation) 3+/5 HF, KE ADF (stable,? Participation) 4/5 RUE (?Participation) Musc/Skel:  No edema or tenderness in extremities. Gen NAD, vital signs reviewed. Psychiatric: Withdrawn  Assessment/Plan: 1. Functional deficits secondary to RIght BG infarction which require 3+ hours per day of interdisciplinary therapy in a comprehensive inpatient rehab setting. Physiatrist is providing close team supervision and 24 hour management of active medical problems listed below. Physiatrist and rehab team continue to assess barriers to discharge/monitor patient progress toward functional and medical goals. FIM: Function - Bathing Bathing activity did not occur: Refused Position: Shower Body parts bathed by patient: Right arm, Left arm, Chest, Abdomen, Front perineal area, Right upper leg, Left upper leg, Right lower leg, Left lower leg, Buttocks Body parts bathed by helper: Back Assist Level: Touching or steadying assistance(Pt > 75%)  Function- Upper Body Dressing/Undressing What is the patient wearing?: Pull over shirt/dress Pull over shirt/dress - Perfomed by patient: Thread/unthread right sleeve, Thread/unthread left sleeve, Pull shirt over trunk, Put head through opening Pull over  shirt/dress - Perfomed by helper: Put head through opening Assist Level: Touching or steadying assistance(Pt > 75%) Function - Lower Body Dressing/Undressing What is the patient wearing?: Pants, Non-skid slipper socks Position: Wheelchair/chair at sink Pants- Performed by patient: Thread/unthread right pants leg, Thread/unthread left pants leg, Pull pants up/down Pants- Performed by helper: Thread/unthread right pants leg, Thread/unthread left pants leg, Pull pants up/down Non-skid slipper socks- Performed by patient: Don/doff right sock, Don/doff left sock Socks - Performed by helper: Don/doff right sock, Don/doff left sock Shoes - Performed by helper: Don/doff right shoe, Don/doff left shoe, Fasten left, Fasten right Assist for footwear: Partial/moderate assist Assist for lower body dressing: Touching or steadying assistance (Pt > 75%)  Function - Toileting Toileting steps completed by patient: Adjust clothing after toileting, Adjust clothing prior to toileting Toileting steps completed by helper: Performs perineal hygiene Toileting Assistive Devices: Grab bar or rail Assist level: Touching or steadying assistance (Pt.75%)  Function - Air cabin crew transfer activity did not occur: Safety/medical concerns Toilet transfer assistive device: Elevated toilet seat/BSC over toilet, Grab bar, Walker Assist level to toilet: Moderate assist (Pt 50 - 74%/lift or lower) Assist level from toilet: Moderate assist (Pt 50 - 74%/lift or lower)  Function - Chair/bed transfer Chair/bed transfer method: Stand pivot Chair/bed transfer assist level: Touching or steadying assistance (Pt > 75%) Chair/bed transfer assistive device: Walker Chair/bed transfer details: Tactile cues for initiation, Tactile cues for sequencing, Tactile cues for weight shifting, Tactile cues for posture, Tactile cues for placement, Verbal cues for technique, Verbal cues for precautions/safety, Manual facilitation for  weight shifting, Manual facilitation for placement  Function - Locomotion: Wheelchair Type: Manual Max wheelchair distance: 30  Assist Level: Touching or steadying assistance (Pt > 75%) Wheel 50 feet with 2 turns activity did not  occur: Safety/medical concerns(fatigue) Wheel 150 feet activity did not occur: Safety/medical concerns Function - Locomotion: Ambulation Assistive device: Walker-rolling Max distance: 48f Assist level: Moderate assist (Pt 50 - 74%) Assist level: Moderate assist (Pt 50 - 74%) Walk 50 feet with 2 turns activity did not occur: Safety/medical concerns Walk 150 feet activity did not occur: Safety/medical concerns Walk 10 feet on uneven surfaces activity did not occur: Safety/medical concerns  Function - Comprehension Comprehension: Auditory Comprehension assist level: Understands basic 50 - 74% of the time/ requires cueing 25 - 49% of the time, Understands basic 75 - 89% of the time/ requires cueing 10 - 24% of the time  Function - Expression Expression: Verbal Expression assist level: Expresses basic 75 - 89% of the time/requires cueing 10 - 24% of the time. Needs helper to occlude trach/needs to repeat words.  Function - Social Interaction Social Interaction assist level: Interacts appropriately 75 - 89% of the time - Needs redirection for appropriate language or to initiate interaction.  Function - Problem Solving Problem solving assist level: Solves basic 25 - 49% of the time - needs direction more than half the time to initiate, plan or complete simple activities, Solves basic 50 - 74% of the time/requires cueing 25 - 49% of the time  Function - Memory Memory assist level: Recognizes or recalls 25 - 49% of the time/requires cueing 50 - 75% of the time, Recognizes or recalls 50 - 74% of the time/requires cueing 25 - 49% of the time Patient normally able to recall (first 3 days only): None of the above  Medical Problem List and Plan: 1.  Altered mental  status with decreased functional mobility secondary to right basal ganglia infarction.  Plan aspirin and Plavix times 3 weeks then Plavix alone  Continue CIR PT, OT, SLP Team conference today please see physician documentation under team conference tab, met with team face-to-face to discuss problems,progress, and goals. Formulized individual treatment plan based on medical history, underlying problem and comorbidities. 2.  DVT Prophylaxis/Anticoagulation: Subcutaneous heparin. 3. Pain Management: Tylenol as needed 4. Mood/mild dementia.  Tofranil 25 mg nightly, anticholinergic effect may worsen cognition d/ced tofranil, trial melatonin for sleep Will try to get baseline from family 5. Neuropsych: This patient is capable of making decisions on his own behalf.- Consult  To Neuropsych need to eval cognition new vs old deficits 6. Skin/Wound Care: Routine skin checks 7. Fluids/Electrolytes/Nutrition: Routine I/O's 8.  CKD stage III.     Creatinine 1.20 on 4/11   Continue to monitor 9.  Hypertension.  HCTZ 25 mg daily.  Monitor with increased mobility    Vitals:   04/12/18 1408 04/13/18 0515  BP: 118/76 121/82  Pulse: (!) 53 (!) 58  Resp: 16   Temp: 98 F (36.7 C) 98.1 F (36.7 C)  SpO2: 96% 96%  Controlled 4/17 10.  History of BPH.  Currently on both Flomax and Ditropan as prior to admission.     PVRs ordered 11.  UTI/leukocytosis.   urine culture shows Klebsiella, change to Keflex   Blood cultures x2 NG final  on 4/15   WBCs 12.0 on 4/11   Afebrile   Continue to monitor 12.  Hyperlipidemia.  Pravachol 13.  Multiple colon resections due to Crohn's disease.  Continue to monitor 14.  Hypothyroidism.  TSH 4.601.Synthroid, prior dose 0.524m now on .2515mill resume .5mg25mse 15.  Vitamin B12 deficiency.  Continue vitamin B12 16.  Knee and hip contracture, amb with walker at home 17.  Hypokalemia  supplementing with KCl 20 mEq/day   Potassium 3.8 on 4/11   Continue to monitor 18.  Hyponatremia  Sodium 134 on 4/11  Continue to monitor 19.  Asymptomatic brady previously documented sinus brady on EKG ( also R and L BBB) LOS (Days) 7 A FACE TO FACE EVALUATION WAS PERFORMED  Charlett Blake 04/13/2018, 8:13 AM

## 2018-04-13 NOTE — Progress Notes (Signed)
Physical Therapy Weekly Progress Note  Patient Details  Name: Anthony Hayes MRN: [id number] Date of Birth: 1935-02-03  Beginning of progress report period: April 06, 2018 End of progress report period: April 13, 2018  Today's Date: 04/13/2018 PT Individual Time: 6578-4696 PT Individual Time Calculation (min): 30 min   Patient has met 2 of 4 short term goals. Pt has demonstrated improvement with gait and transfers at overall min-mod assist level, but still demonstrates L hemiparesis, global weakness, decreased attention and command following, impaired short term memory, lethargy, impaired coordination and decreased dynamic standing balance. Pt would continue to benefit from skilled PT services to focus on these impairments and maximize safe, functional mobility.   Patient continues to demonstrate the following deficits muscle weakness, decreased cardiorespiratoy endurance, ataxia and decreased coordination, decreased attention, decreased awareness and decreased memory and decreased standing balance and hemiplegia and therefore will continue to benefit from skilled PT intervention to increase functional independence with mobility.  Patient progressing toward long term goals..  Continue plan of care.  PT Short Term Goals Week 1:  PT Short Term Goal 1 (Week 1): Pt will perform bed mobility with S PT Short Term Goal 1 - Progress (Week 1): Not met PT Short Term Goal 2 (Week 1): Pt will perform stand pivot transfer minA PT Short Term Goal 2 - Progress (Week 1): Met PT Short Term Goal 3 (Week 1): Pt will ambulate x50' modA PT Short Term Goal 3 - Progress (Week 1): Met PT Short Term Goal 4 (Week 1): Pt will perform w/c propulsion x50' with minA PT Short Term Goal 4 - Progress (Week 1): Not met Week 2:  PT Short Term Goal 1 (Week 2): STG=LTG due to ELOS  Skilled Therapeutic Interventions/Progress Updates:   Pt received in w/c, agreeable to therapy. Pt denies pain but complains of fatigue. Therapist  propelled pt in w/c to gym for time management. Pt demonstrated decreased command following and poor attention throughout session. Pt frequently responds "I don't know" to questions, continues to answer this way even with encouragement and cuing. Pt unable to recall what he did in earlier therapy sessions. Pt transferred w/c>mat with RW and min assist, verbal cuing for sequencing and technique. Pt performed standing toe taps to targets on ground w/o UE support to facilitate weight shifting to L side and L NMR, requiring mod assist for balance. Pt demonstrated decreased L hip flexion, L DF, and impaired coordination with activity, requiring verbal cuing for technique and to improve foot clearance. Pt performed 10 sit<>stands w/o UE support for BLE strengthening, requiring min assist. Pt performed sit<>stands w/ 4'' step under RLE to force use of LLE, requiring mod assist and verbal cuing for hand placement. Pt reports increased difficulty with task, reporting his LLE is weaker than RLE. Pt educated on purpose of therapy 2/2 decreased awareness to situation and functional impairments. Pt ambulated w/ RW and mod assist 50 ft, requiring verbal cuing to increase bilateral step width and maintain upright posture. Pt demonstrates ataxic gait pattern with narrow BOS and forward flexed posture. At end of session, pt left seated in w/c, quick release belt on, chair alarm on, all needs within reach.   Therapy Documentation Precautions:  Precautions Precautions: Fall Precaution Comments: L side hemiplegia and ataxia Restrictions Weight Bearing Restrictions: No  See Function Navigator for Current Functional Status.  Therapy/Group: Individual Therapy  Caffie Damme 04/13/2018, 4:26 PM

## 2018-04-13 NOTE — Patient Care Conference (Signed)
Inpatient RehabilitationTeam Conference and Plan of Care Update Date: 04/13/2018   Time: 10:55 AM    Patient Name: Anthony Hayes      Medical Record Number: [id number]  Date of Birth: [birth year] Sex: Male         Room/Bed: 4W19C/4W19C-01 Payor Info: Payor: MEDICARE / Plan: MEDICARE PART A AND B / Product Type: *No Product type* /    Admitting Diagnosis: CVA  Admit Date/Time:  04/06/2018  4:07 PM Admission Comments: No comment available   Primary Diagnosis:  <principal problem not specified> Principal Problem: <principal problem not specified>  Patient Active Problem List   Diagnosis Date Noted  . Sleep disturbance   . Stage 3 chronic kidney disease ([place])   . Benign essential HTN   . Hypokalemia   . Hyponatremia   . Infarction of right basal ganglia ([place]) 04/06/2018  . Pressure injury of skin 04/06/2018  . Vitamin B12 deficiency   . Hypothyroidism   . Crohn's disease with complication ([place])   . Benign prostatic hyperplasia   . Acute lower UTI   . Dementia without behavioral disturbance   . Acute ischemic stroke ([place]) 04/05/2018  . Leukocytosis   . Shortness of breath   . Acute encephalopathy   . CVA (cerebral vascular accident) ([place]) 04/03/2018  . Calculus, kidney 01/21/2018  . Acute blood loss anemia   . Mallory-Weiss syndrome   . Esophageal stricture   . Respiratory failure ([place])   . UGIB (upper gastrointestinal bleed) 10/16/2017  . GERD (gastroesophageal reflux disease) 10/16/2017  . Essential hypertension, benign 10/16/2017  . Chest pain 10/15/2017  . Hypothyroidism (acquired) 06/08/2017  . Chronic kidney disease, stage III (moderate) ([place]) 03/19/2016  . Dyslipidemia 03/19/2016  . Degeneration of lumbar or lumbosacral intervertebral disc 07/10/2008  . Arthropathy, lower leg 05/23/2004    Expected Discharge Date: Expected Discharge Date: 04/22/18  Team Members Present: Physician leading conference: Dr. Alysia Penna Social Worker Present: Ovidio Kin,  LCSW Nurse Present: Dorthula Nettles, RN PT Present: Lavone Nian, PT OT Present: Napoleon Form, OT SLP Present: Stormy Fabian, SLP PPS Coordinator present : Daiva Nakayama, RN, CRRN     Current Status/Progress Goal Weekly Team Focus  Medical   hx of dementia, incont of bowel adn bladder  upgrade function to allow return to assisted living with additional support  improve bowel continence   Bowel/Bladder   Incontinent B/B  Continue to toilet q 2 hr and PRN  No s/s of constipation/UTI   Swallow/Nutrition/ Hydration             ADL's   Min A functional transfers; Steadying assist LB dressing, bathing, and toileting, Supervision/ VCs UB bathing, UB dressing, and grooming  Supervision overall   Cognitive re-training and orientation, functional standing balance/endurance, safety awareness, ADL re-training   Mobility   min<>max assist depending on alertness, short distance gait with RW & mod assist, impaired cognition  min assist<>mod I, ambulation goals may be upgraded to supervision with LRAD  balance, NMR, transfers, gait, endurance, strengthening, cognitive remediation   Communication             Safety/Cognition/ Behavioral Observations  Mod A for basic   Min A   compensatory memory strategies, basic problem solving, sustained attention   Pain   No c/o pain  Less than 2  Access q 4hr and PRN   Skin   CDI  Remains CDI  Access skin q shift and PRN.      *See Care Plan and progress  notes for long and short-term goals.     Barriers to Discharge  Current Status/Progress Possible Resolutions Date Resolved   Physician    Medical stability;Incontinence;Behavior;Other (comments)  underlying dementia  slow progress toward goals  cont rehab      Nursing                  PT  Incontinence  pre-morbid dementia              OT                  SLP Behavior pt is frequently irritable            SW                Discharge Planning/Teaching Needs:  Hopefully to go back to [facility] will need to be ambulatory and can provide supervision level. Daughter's involved.      Team Discussion:  Goals supervision-min assist level. Needs constant cueing. Problems with attention, concentration and memory which could be from dementia diagnosis and being in a unfamiliar environment. Inconsistent depending time of day and how he is feeling. Incontinent of both bowel and bladder. Nursing on timed toileting. Need to see if can go back to ALF and they can provide the care he requires.  Revisions to Treatment Plan:  DC 4/26    Continued Need for Acute Rehabilitation Level of Care: The patient requires daily medical management by a physician with specialized training in physical medicine and rehabilitation for the following conditions: Daily direction of a multidisciplinary physical rehabilitation program to ensure safe treatment while eliciting the highest outcome that is of practical value to the patient.: Yes Daily medical management of patient stability for increased activity during participation in an intensive rehabilitation regime.: Yes Daily analysis of laboratory values and/or radiology reports with any subsequent need for medication adjustment of medical intervention for : Neurological problems;Mood/behavior problems  Janalee Grobe, Gardiner Rhyme 04/14/2018, 9:06 AM

## 2018-04-13 NOTE — Progress Notes (Signed)
Occupational Therapy Session Note  Patient Details  Name: Anthony Hayes MRN: [id number] Date of Birth: [birth year]  Today's Date: 04/13/2018 OT Individual Time: 4696-2952 OT Individual Time Calculation (min): 60 min    Short Term Goals: Week 1:  OT Short Term Goal 1 (Week 1): Pt will complete stand pivot transfer with mod A using LRAD OT Short Term Goal 2 (Week 1): Pt will don shirt with set-up/ VCs OT Short Term Goal 3 (Week 1): Pt will don shoes/socks with min A using AE PRN OT Short Term Goal 4 (Week 1): Pt will complete 2/3 toileting tasks with steadying assist OT Short Term Goal 5 (Week 1): Pt will complete 1 grooming task standing at sink with steadying assist in order to increase functional activity tolerance  Skilled Therapeutic Interventions/Progress Updates:    Pt seen for OT session focusing on functional amblation, transfers, and ADL re-training. Pt asleep in supine upon arrival, easily awoken and voicing had incontinent episode. He transferred to EOB with min A using hospital bed functions and cuing for sequencing/technique. He required mod A for ambulation into bathroom using RW with max cuing and assist for RW management in functional context.  Pt with +BM seated on BSC, lateral leans to complete buttock hygiene with guarding assist.  He returned to w/c to complete bathing tasks from w/c level at sink, required cues to initiate question behavioral vs. Cognitive. He also required cues to progress along with task due to perseveration. He stood at sink with steadying assist to complete pericare/buttock hygiene. He dressed seated in w/c, required increased time for donning shirt due to limited shoulder flexion. Cross ankle over knees to thread pants and stood at sink  With steadying assist to pull pants up, requiring significantly increased time and cuing to complete task in entirety.  Throughout session, pt required significantly increased time and cuing to initiate all tasks as well as  rest breaks throughout due to decreased functional activity tolerance.  Pt left seated in w/c at end of session, chair alarm on, QRB donned, set-up with breakfast and ready for hand off to SLP.   Therapy Documentation Precautions:  Precautions Precautions: Fall Precaution Comments: Hayes side hemiplegia and ataxia Restrictions Weight Bearing Restrictions: No Pain:   No/denies pain  See Function Navigator for Current Functional Status.   Therapy/Group: Individual Therapy  Anthony Hayes 04/13/2018, 7:02 AM

## 2018-04-13 NOTE — Progress Notes (Signed)
Physical Therapy Session Note  Patient Details  Name: Anthony Hayes MRN: [id number] Date of Birth: 1935/06/22  Today's Date: 04/13/2018 PT Individual Time: 1000-1100 PT Individual Time Calculation (min): 60 min   Short Term Goals: Week 1:  PT Short Term Goal 1 (Week 1): Pt will perform bed mobility with S PT Short Term Goal 2 (Week 1): Pt will perform stand pivot transfer minA PT Short Term Goal 3 (Week 1): Pt will ambulate x50' modA PT Short Term Goal 4 (Week 1): Pt will perform w/c propulsion x50' with minA  Skilled Therapeutic Interventions/Progress Updates:    Pt seated in w/c in room, reports feeling fatigued but is agreeable to PT. No complaints of pain. Ambulation 4 x 5 ft forwards/backwards in // bars with min assist with focus on widening BOS with gait, use of visual cues to keep feet apart. Ambulation 4 x 30 ft in hallway with handrail and hand-held min assist. Pt is able to decrease scissoring of gait pattern with decreased use of visual cues. Stand pivot transfer w/c to mat with min assist. Sit to/from supine with min assist. Supine B HS and gastroc stretch 3 x 30 sec each, demonstrated use of sheet to perform stretch so pt can be more independent with stretching program. Squat pivot transfer mat to w/c with min assist, verbal and manual cues for technique and for safety. Pt left seated in w/c in room with needs in reach, chair alarm and quick-release belt in place.  Therapy Documentation Precautions:  Precautions Precautions: Fall Precaution Comments: L side hemiplegia and ataxia Restrictions Weight Bearing Restrictions: No  See Function Navigator for Current Functional Status.   Therapy/Group: Individual Therapy  Excell Seltzer, PT, DPT  04/13/2018, 12:15 PM

## 2018-04-14 ENCOUNTER — Inpatient Hospital Stay (HOSPITAL_COMMUNITY): Payer: Medicare Other | Admitting: Occupational Therapy

## 2018-04-14 ENCOUNTER — Inpatient Hospital Stay (HOSPITAL_COMMUNITY): Payer: Medicare Other | Admitting: Physical Therapy

## 2018-04-14 ENCOUNTER — Inpatient Hospital Stay (HOSPITAL_COMMUNITY): Payer: Medicare Other | Admitting: Speech Pathology

## 2018-04-14 NOTE — Progress Notes (Signed)
Subjective/Complaints:  Pt without c/o remembers that I am "one of the doctors"  Review of systems: limited due to behavior, however denies chest pain, shortness of breath, nausea, vomiting, diarrhea  Objective: Vital Signs: Blood pressure 100/60, pulse (!) 58, temperature 98.1 F (36.7 C), temperature source Oral, resp. rate 16, height 5' 7"  (1.702 m), weight 76 kg (167 lb 8.8 oz), SpO2 99 %. No results found. No results found for this or any previous visit (from the past 72 hour(s)).   HEENT: normocephalic. Atraumatic. Cardio: RRR and no JVD Resp: CTA B/L and unlabored GI: BS positive and ND Skin:   Intact. Warm and dry. Neuro: sleepy. Motor 3+/5 Left delt, Biceps , triceps and grip (stable,? Participation) 3+/5 HF, KE ADF (stable,? Participation) 4/5 RUE (?Participation) Musc/Skel:  No edema or tenderness in extremities. Gen NAD, vital signs reviewed. Psychiatric: Withdrawn  Assessment/Plan: 1. Functional deficits secondary to RIght BG infarction which require 3+ hours per day of interdisciplinary therapy in a comprehensive inpatient rehab setting. Physiatrist is providing close team supervision and 24 hour management of active medical problems listed below. Physiatrist and rehab team continue to assess barriers to discharge/monitor patient progress toward functional and medical goals. FIM: Function - Bathing Bathing activity did not occur: Refused Position: Wheelchair/chair at sink Body parts bathed by patient: Right arm, Left arm, Chest, Abdomen, Front perineal area, Right upper leg, Left upper leg, Right lower leg, Left lower leg, Buttocks Body parts bathed by helper: Back Assist Level: Touching or steadying assistance(Pt > 75%)  Function- Upper Body Dressing/Undressing What is the patient wearing?: Pull over shirt/dress Pull over shirt/dress - Perfomed by patient: Thread/unthread right sleeve, Thread/unthread left sleeve, Pull shirt over trunk, Put head through  opening Pull over shirt/dress - Perfomed by helper: Put head through opening Assist Level: Supervision or verbal cues Function - Lower Body Dressing/Undressing What is the patient wearing?: Pants, Non-skid slipper socks Position: Wheelchair/chair at sink Pants- Performed by patient: Thread/unthread right pants leg, Thread/unthread left pants leg, Pull pants up/down Pants- Performed by helper: Thread/unthread right pants leg, Thread/unthread left pants leg, Pull pants up/down Non-skid slipper socks- Performed by patient: Don/doff right sock, Don/doff left sock Socks - Performed by helper: Don/doff right sock, Don/doff left sock Shoes - Performed by helper: Don/doff right shoe, Don/doff left shoe, Fasten left, Fasten right Assist for footwear: Partial/moderate assist Assist for lower body dressing: Touching or steadying assistance (Pt > 75%)  Function - Toileting Toileting steps completed by patient: Adjust clothing after toileting, Adjust clothing prior to toileting, Performs perineal hygiene Toileting steps completed by helper: Performs perineal hygiene Toileting Assistive Devices: Grab bar or rail Assist level: Touching or steadying assistance (Pt.75%)  Function - Air cabin crew transfer activity did not occur: Safety/medical concerns Toilet transfer assistive device: Elevated toilet seat/BSC over toilet, Grab bar, Walker Assist level to toilet: Touching or steadying assistance (Pt > 75%) Assist level from toilet: Touching or steadying assistance (Pt > 75%)  Function - Chair/bed transfer Chair/bed transfer method: Stand pivot Chair/bed transfer assist level: Touching or steadying assistance (Pt > 75%) Chair/bed transfer assistive device: Walker Chair/bed transfer details: Tactile cues for initiation, Tactile cues for sequencing, Tactile cues for weight shifting, Tactile cues for placement, Verbal cues for sequencing, Verbal cues for technique, Verbal cues for  precautions/safety, Verbal cues for safe use of DME/AE, Manual facilitation for weight shifting, Manual facilitation for placement  Function - Locomotion: Wheelchair Type: Manual Max wheelchair distance: 30  Assist Level: Touching or steadying assistance (Pt >  75%) Wheel 50 feet with 2 turns activity did not occur: Safety/medical concerns(fatigue) Wheel 150 feet activity did not occur: Safety/medical concerns Function - Locomotion: Ambulation Assistive device: Walker-rolling Max distance: 50 ft Assist level: Moderate assist (Pt 50 - 74%) Assist level: Moderate assist (Pt 50 - 74%) Walk 50 feet with 2 turns activity did not occur: Safety/medical concerns Walk 150 feet activity did not occur: Safety/medical concerns Walk 10 feet on uneven surfaces activity did not occur: Safety/medical concerns  Function - Comprehension Comprehension: Auditory Comprehension assist level: Understands basic 75 - 89% of the time/ requires cueing 10 - 24% of the time  Function - Expression Expression: Verbal Expression assist level: Expresses basic 75 - 89% of the time/requires cueing 10 - 24% of the time. Needs helper to occlude trach/needs to repeat words.  Function - Social Interaction Social Interaction assist level: Interacts appropriately 75 - 89% of the time - Needs redirection for appropriate language or to initiate interaction.  Function - Problem Solving Problem solving assist level: Solves basic 50 - 74% of the time/requires cueing 25 - 49% of the time  Function - Memory Memory assist level: Recognizes or recalls 50 - 74% of the time/requires cueing 25 - 49% of the time Patient normally able to recall (first 3 days only): That he or she is in a hospital, Current season  Medical Problem List and Plan: 1.  Altered mental status with decreased functional mobility secondary to right basal ganglia infarction.  Plan aspirin and Plavix times 3 weeks then Plavix alone  Continue CIR PT, OT, SLP 2.   DVT Prophylaxis/Anticoagulation: Subcutaneous heparin. 3. Pain Management: Tylenol as needed 4. Mood/mild dementia.  Tofranil 25 mg nightly, anticholinergic effect may worsen cognition d/ced tofranil, trial melatonin for sleep Will try to get baseline from family 5. Neuropsych: This patient is capable of making decisions on his own behalf.- per SLP def appear chronic   6. Skin/Wound Care: Routine skin checks 7. Fluids/Electrolytes/Nutrition: Routine I/O's 8.  CKD stage III.     Creatinine 1.20 on 4/11   Continue to monitor 9.  Hypertension.  HCTZ 25 mg daily.  Monitor with increased mobility    Vitals:   04/13/18 1451 04/14/18 0143  BP: 129/74 100/60  Pulse: (!) 54 (!) 58  Resp: 18 16  Temp: 98.3 F (36.8 C) 98.1 F (36.7 C)  SpO2: 98% 99%  Controlled 4/18 10.  History of BPH.  Currently on both Flomax and Ditropan as prior to admission.     PVRs ordered 11.  UTI/leukocytosis.   urine culture shows Klebsiella, change to Keflex   Blood cultures x2 NG final  on 4/15   WBCs 12.0 on 4/11   Afebrile   Continue to monitor 12.  Hyperlipidemia.  Pravachol 13.  Multiple colon resections due to Crohn's disease.  Continue to monitor 14.  Hypothyroidism.  TSH 4.601.Synthroid, prior dose 0.37m, now on .282mwill resume .79m27mose, HR may slowly  increase 15.  Vitamin B12 deficiency.  Continue vitamin B12 16.  Knee and hip contracture, amb with walker at home 17.  Hypokalemia supplementing with KCl 20 mEq/day   Potassium 3.8 on 4/11   Continue to monitor 18. Hyponatremia  Sodium 134 on 4/11  Continue to monitor 19.  Asymptomatic brady previously documented sinus brady on EKG ( also R and L BBB) LOS (Days) 8 A FACE TO FACE EVALUATION WAS PERFORMED  AndCharlett Blake18/2019, 7:57 AM

## 2018-04-14 NOTE — Progress Notes (Signed)
Occupational Therapy Weekly Progress Note  Patient Details  Name: Anthony Hayes MRN: [id number] Date of Birth: 1935-03-08  Beginning of progress report period: April 07, 2018 End of progress report period: April 14, 2018  Today's Date: 04/14/2018 OT Individual Time: 1000-1100 OT Individual Time Calculation (min): 60 min    Patient has met 5 of 5 short term goals.  Pt is making steady progress towards OT goals. He currently requires overall min-mod A depending on fatigue level and attention to task. Pt requires frequent cuing for initiation of all steps of an activity including basic ADLs. Pt cont with poor safety awareness, poor awareness of deficits, and poor frustration tolerance. His orientation varies throughout the day, ranging from oriented x4 to oriented x1. Pt will require 24hr close supervision and occasional min A at d/c.   Patient continues to demonstrate the following deficits: abnormal posture, apraxia, ataxia, cognitive deficits, hemiplegia affecting dominant side and muscle weakness (generalized) and therefore will continue to benefit from skilled OT intervention to enhance overall performance with BADL and Reduce care partner burden.  Patient progressing toward long term goals..  Continue plan of care. Tub/shower transfer goal was removed during reporting period as pt reports he only sponge bathed at sink PTA.  OT Short Term Goals Week 1:  OT Short Term Goal 1 (Week 1): Pt will complete stand pivot transfer with mod A using LRAD OT Short Term Goal 1 - Progress (Week 1): Met OT Short Term Goal 2 (Week 1): Pt will don shirt with set-up/ VCs OT Short Term Goal 2 - Progress (Week 1): Met OT Short Term Goal 3 (Week 1): Pt will don shoes/socks with min A using AE PRN OT Short Term Goal 3 - Progress (Week 1): Met OT Short Term Goal 4 (Week 1): Pt will complete 2/3 toileting tasks with steadying assist OT Short Term Goal 4 - Progress (Week 1): Met OT Short Term Goal 5 (Week 1): Pt  will complete 1 grooming task standing at sink with steadying assist in order to increase functional activity tolerance OT Short Term Goal 5 - Progress (Week 1): Met Week 2:  OT Short Term Goal 1 (Week 2): STG=LTG due to LOS  Skilled Therapeutic Interventions/Progress Updates:    Pt seen for OT session focusing on functional ambulation, ADL re-training and functional activity tolerance. Pt sitting up in w/c upon arrival, denying pain and willing to participate in therapy session.  He voiced desire for cup of coffee and willing to go to therapy room to make cup. He ambulated withing crowded environment with min- occasional mod A with frequent cuing for RW management and safety during ambulation. Pt tolerated standing ~5 minutes to complete task. Seated rest break provided on low soft surface couch, pt able to stand with supervision and VCs for hand placement on RW from couch. In room, completed toileting task with guarding assist during clothing management demonstrating improved functional standing balance without UE support.  Grooming tasks completed standing at sink with guarding assist. Following seated rest break, pt ambulated to therapy day room in same manner as described above. He required VCs for safety awareness and attention to task as pt easily distracted in highly stimulating environment with decreased safety awareness. Pt left seated in chair with hand off to SLP.   Therapy Documentation Precautions:  Precautions Precautions: Fall Precaution Comments: L side hemiplegia and ataxia Restrictions Weight Bearing Restrictions: No Pain:   No/denies pain  See Function Navigator for Current Functional Status.   Therapy/Group:  Individual Therapy  Ryatt Corsino L 04/14/2018, 7:00 AM

## 2018-04-14 NOTE — Progress Notes (Signed)
Physical Therapy Session Note  Patient Details  Name: Anthony Hayes MRN: [id number] Date of Birth: September 07, 1935  Today's Date: 04/14/2018 PT Individual Time: 5087-1994 PT Individual Time Calculation (min): 60 min   Short Term Goals: Week 1:  PT Short Term Goal 1 (Week 1): Pt will perform bed mobility with S PT Short Term Goal 1 - Progress (Week 1): Not met PT Short Term Goal 2 (Week 1): Pt will perform stand pivot transfer minA PT Short Term Goal 2 - Progress (Week 1): Met PT Short Term Goal 3 (Week 1): Pt will ambulate x50' modA PT Short Term Goal 3 - Progress (Week 1): Met PT Short Term Goal 4 (Week 1): Pt will perform w/c propulsion x50' with minA PT Short Term Goal 4 - Progress (Week 1): Not met Week 2:  PT Short Term Goal 1 (Week 2): STG=LTG due to ELOS  Skilled Therapeutic Interventions/Progress Updates:   Pt received sitting in WC and agreeable to PT, but reports need for rest room. Stand pivot transfer to toilet with min assist and use of garb bar. Pt noted to have already been incontinent.   nustep reciprocal movement and endurance training x 10 minutes. Pt had been instructed to stop at 8 minutes, but pt unable to recall target time and continued to fatigue.   WC mobility instructed by PT x 16f with increased time and supervision assist from PT. Moderate cues to improve use of the LLE. To maintain straight path.   Gait training with RW x 14106fwith min assist from PT. PT required to facilitate improved weight shift to prevent foot drag.   [facility] returned to [facility] and [facility] reach and all needs met.            Therapy Documentation Precautions:  Precautions Precautions: Fall Precaution Comments: L side hemiplegia and ataxia Restrictions Weight Bearing Restrictions: No   See Function Navigator for Current Functional Status.   Therapy/Group: Individual Therapy  AuLorie Phenix/18/2019, 4:04 PM

## 2018-04-14 NOTE — Progress Notes (Signed)
Speech Language Pathology Weekly Progress and Session Note  Patient Details  Name: Axcel Horsch MRN: [id number] Date of Birth: 07/11/1935  Beginning of progress report period: April 07, 2018 End of progress report period: April 14, 2018  Today's Date: 04/14/2018 SLP Individual Time: 1100-1200 SLP Individual Time Calculation (min): 60 min  Short Term Goals: Week 1: SLP Short Term Goal 1 (Week 1): Pt will sustain hiis attention to functional tasks for 10 minute intervals with mod verbal cues for redirection.  SLP Short Term Goal 1 - Progress (Week 1): Met SLP Short Term Goal 2 (Week 1): Pt will complete basic familiar tasks with mod assist verbal cues for functional problem solving.  SLP Short Term Goal 2 - Progress (Week 1): Met SLP Short Term Goal 3 (Week 1): Pt will recall daily information with mod assist verbal cues for use of external aids.  SLP Short Term Goal 3 - Progress (Week 1): Not met SLP Short Term Goal 4 (Week 1): Pt will return demonstration of at least 2 safety precautions during functional tasks with mod assist verbal cues.  SLP Short Term Goal 4 - Progress (Week 1): Met    New Short Term Goals: Week 2: SLP Short Term Goal 1 (Week 2): Pt will sustain hiis attention to functional tasks for 10 minute intervals with min verbal cues for redirection.  SLP Short Term Goal 2 (Week 2): Pt will complete basic familiar tasks with min assist verbal cues for functional problem solving.  SLP Short Term Goal 3 (Week 2): Pt will recall daily information with mod assist verbal cues for use of external aids.  SLP Short Term Goal 4 (Week 2): Pt will return demonstration of at least 2 safety precautions during functional tasks with min assist verbal cues.   Weekly Progress Updates: Pt has made fair progress this reporting period and as a result he has met 3 of 4 STGs. Pt is currently emerging towards a Min A level of support with follow up recommendations for SNF placement to better provide  for his cognitive needs (which are close to baseline in setting of dementia and new environment of hospital). Pt's progress can also fluctuate depending on his level of frustration with task d/t lack of overall awareness of deficits.      Intensity: Minumum of 1-2 x/day, 30 to 90 minutes Frequency: 3 to 5 out of 7 days Duration/Length of Stay: pending SNF placement Treatment/Interventions: Cognitive remediation/compensation;Cueing hierarchy;Functional tasks;Patient/family education;Internal/external aids;Environmental controls   Daily Session  Skilled Therapeutic Interventions: Skilled treatment session focused on cognition goals. Pt required Mod A  Cues to complete novel card game played in its most basic form (Blink) for selective attention and for overall safety awareness. Pt is largely put out at being in CIR and doesn't see need for therpay or SNF, Education provided and pt eventually in agreement. Pt was returned to room, left upright in wheelchair, chair alarm on, safety belt donned and all needs within reach. Continue per current plan of care.     Function:     Cognition Comprehension Comprehension assist level: Understands basic 75 - 89% of the time/ requires cueing 10 - 24% of the time;Understands basic 50 - 74% of the time/ requires cueing 25 - 49% of the time  Expression   Expression assist level: Expresses basic 75 - 89% of the time/requires cueing 10 - 24% of the time. Needs helper to occlude trach/needs to repeat words.  Social Interaction Social Interaction assist level: Interacts appropriately 75 -  89% of the time - Needs redirection for appropriate language or to initiate interaction.  Problem Solving Problem solving assist level: Solves basic 50 - 74% of the time/requires cueing 25 - 49% of the time  Memory Memory assist level: Recognizes or recalls 50 - 74% of the time/requires cueing 25 - 49% of the time   General    Pain Pain Assessment Pain Scale: 0-10 Pain  Score: 0-No pain  Therapy/Group: Individual Therapy  Jaci Desanto 04/14/2018, 11:53 AM

## 2018-04-14 NOTE — Progress Notes (Signed)
Occupational Therapy Session Note  Patient Details  Name: Anthony Hayes MRN: [id number] Date of Birth: July 31, 1935  Today's Date: 04/14/2018 OT Individual Time: 1300-1330 OT Individual Time Calculation (min): 30 min    Short Term Goals: Week 2:  OT Short Term Goal 1 (Week 2): STG=LTG due to LOS  Skilled Therapeutic Interventions/Progress Updates:    Pt seen for OT session focusing on functional ambulation during ADLs and functional standing balance. Pt sitting up in w/c upon arrival, requiring min questioning cues for orientation. Pt unable to recall any events of this morning's therapy session with this therapist. He ambulated into bathroom with min-mod A with VCs for RW management and awareness to environmental obstacles. Toileting task completed with steadying assist during dynamic standing.  Pt taken to therapy gym total  A in w/c for time and energy conservation. Completed peg board activity standing on foam wedge for calf stretch. Pt able to replicate moderately challenging pattern with min questioning cues and increased time. Pt able to self recognize ~50% of errors without cuing.  Pt returned to room at end of session, left seated up in w/c with QRB donned, chair alarm on and all needs in reach.   Therapy Documentation Precautions:  Precautions Precautions: Fall Precaution Comments: L side hemiplegia and ataxia Restrictions Weight Bearing Restrictions: No Pain:   No/denies pain  See Function Navigator for Current Functional Status.   Therapy/Group: Individual Therapy  Edwen Mclester L 04/14/2018, 3:11 PM

## 2018-04-14 NOTE — Plan of Care (Signed)
  Problem: RH BLADDER ELIMINATION Goal: RH STG MANAGE BLADDER WITH ASSISTANCE Description STG Manage Bladder With Assistance. Mod I  Outcome: Progressing   Problem: RH SKIN INTEGRITY Goal: RH STG MAINTAIN SKIN INTEGRITY WITH ASSISTANCE Description STG Maintain Skin Integrity With Assistance. Mod I  Outcome: Progressing   Problem: RH SAFETY Goal: RH STG ADHERE TO SAFETY PRECAUTIONS W/ASSISTANCE/DEVICE Description STG Adhere to Safety Precautions With Assistance/Device. Min  Outcome: Progressing   Problem: RH COGNITION-NURSING Goal: RH STG USES MEMORY AIDS/STRATEGIES W/ASSIST TO PROBLEM SOLVE Description STG Uses Memory Aids/Strategies With Assistance to Problem Solve. Min cues  Outcome: Progressing   Problem: RH BOWEL ELIMINATION Goal: RH STG MANAGE BOWEL WITH ASSISTANCE Description STG Manage Bowel with Assistance. Mod I  Outcome: Not Progressing   Required max assist for incontinent bowel movement.

## 2018-04-14 NOTE — Progress Notes (Signed)
Social Work   Dorea Duff, Eliezer Champagne  Social Worker  Physical Medicine and Rehabilitation  Patient Care Conference  Signed  Date of Service:  04/13/2018  1:13 PM          Signed          [] Hide copied text  [] Hover for details   Inpatient RehabilitationTeam Conference and Plan of Care Update Date: 04/13/2018   Time: 10:55 AM      Patient Name: Anthony Hayes      Medical Record Number: [id number]  Date of Birth: 16-Jul-1935 Sex: Male         Room/Bed: 4W19C/4W19C-01 Payor Info: Payor: MEDICARE / Plan: MEDICARE PART A AND B / Product Type: *No Product type* /     Admitting Diagnosis: CVA  Admit Date/Time:  04/06/2018  4:07 PM Admission Comments: No comment available    Primary Diagnosis:  <principal problem not specified> Principal Problem: <principal problem not specified>       Patient Active Problem List    Diagnosis Date Noted  . Sleep disturbance    . Stage 3 chronic kidney disease ([place])    . Benign essential HTN    . Hypokalemia    . Hyponatremia    . Infarction of right basal ganglia ([place]) 04/06/2018  . Pressure injury of skin 04/06/2018  . Vitamin B12 deficiency    . Hypothyroidism    . Crohn's disease with complication ([place])    . Benign prostatic hyperplasia    . Acute lower UTI    . Dementia without behavioral disturbance    . Acute ischemic stroke ([place]) 04/05/2018  . Leukocytosis    . Shortness of breath    . Acute encephalopathy    . CVA (cerebral vascular accident) ([place]) 04/03/2018  . Calculus, kidney 01/21/2018  . Acute blood loss anemia    . Mallory-Weiss syndrome    . Esophageal stricture    . Respiratory failure ([place])    . UGIB (upper gastrointestinal bleed) 10/16/2017  . GERD (gastroesophageal reflux disease) 10/16/2017  . Essential hypertension, benign 10/16/2017  . Chest pain 10/15/2017  . Hypothyroidism (acquired) 06/08/2017  . Chronic kidney disease, stage III (moderate) ([place]) 03/19/2016  . Dyslipidemia 03/19/2016  . Degeneration  of lumbar or lumbosacral intervertebral disc 07/10/2008  . Arthropathy, lower leg 05/23/2004      Expected Discharge Date: Expected Discharge Date: 04/22/18   Team Members Present: Physician leading conference: Dr. Alysia Penna Social Worker Present: Ovidio Kin, LCSW Nurse Present: Dorthula Nettles, RN PT Present: Lavone Nian, PT OT Present: Napoleon Form, OT SLP Present: Stormy Fabian, SLP PPS Coordinator present : Daiva Nakayama, RN, CRRN       Current Status/Progress Goal Weekly Team Focus  Medical     hx of dementia, incont of bowel adn bladder  upgrade function to allow return to assisted living with additional support  improve bowel continence   Bowel/Bladder     Incontinent B/B  Continue to toilet q 2 hr and PRN  No s/s of constipation/UTI   Swallow/Nutrition/ Hydration               ADL's     Min A functional transfers; Steadying assist LB dressing, bathing, and toileting, Supervision/ VCs UB bathing, UB dressing, and grooming  Supervision overall   Cognitive re-training and orientation, functional standing balance/endurance, safety awareness, ADL re-training   Mobility     min<>max assist depending on alertness, short distance gait with RW & mod assist, impaired cognition  min assist<>mod I, ambulation goals may be upgraded to supervision with LRAD  balance, NMR, transfers, gait, endurance, strengthening, cognitive remediation   Communication               Safety/Cognition/ Behavioral Observations   Mod A for basic   Min A   compensatory memory strategies, basic problem solving, sustained attention   Pain     No c/o pain  Less than 2  Access q 4hr and PRN   Skin     CDI  Remains CDI  Access skin q shift and PRN.     *See Care Plan and progress notes for long and short-term goals.      Barriers to Discharge   Current Status/Progress Possible Resolutions Date Resolved   Physician     Medical stability;Incontinence;Behavior;Other (comments)  underlying dementia   slow progress toward goals  cont rehab      Nursing                 PT  Incontinence  pre-morbid dementia              OT                 SLP Behavior pt is frequently irritable           SW              Discharge Planning/Teaching Needs:  Hopefully to go back to [facility] will need to be ambulatory and can provide supervision level. Daughter's involved.      Team Discussion:  Goals supervision-min assist level. Needs constant cueing. Problems with attention, concentration and memory which could be from dementia diagnosis and being in a unfamiliar environment. Inconsistent depending time of day and how he is feeling. Incontinent of both bowel and bladder. Nursing on timed toileting. Need to see if can go back to ALF and they can provide the care he requires.  Revisions to Treatment Plan:  DC 4/26    Continued Need for Acute Rehabilitation Level of Care: The patient requires daily medical management by a physician with specialized training in physical medicine and rehabilitation for the following conditions: Daily direction of a multidisciplinary physical rehabilitation program to ensure safe treatment while eliciting the highest outcome that is of practical value to the patient.: Yes Daily medical management of patient stability for increased activity during participation in an intensive rehabilitation regime.: Yes Daily analysis of laboratory values and/or radiology reports with any subsequent need for medication adjustment of medical intervention for : Neurological problems;Mood/behavior problems   [name] 04/14/2018, 9:06 AM                 Patient ID: Anthony Hayes, male   DOB: 1935-01-04, 83 y.o.   MRN: [id number]

## 2018-04-15 ENCOUNTER — Inpatient Hospital Stay (HOSPITAL_COMMUNITY): Payer: Medicare Other | Admitting: Occupational Therapy

## 2018-04-15 ENCOUNTER — Inpatient Hospital Stay (HOSPITAL_COMMUNITY): Payer: Medicare Other | Admitting: Speech Pathology

## 2018-04-15 ENCOUNTER — Inpatient Hospital Stay (HOSPITAL_COMMUNITY): Payer: Medicare Other | Admitting: Physical Therapy

## 2018-04-15 ENCOUNTER — Inpatient Hospital Stay (HOSPITAL_COMMUNITY): Payer: Medicare Other

## 2018-04-15 NOTE — Progress Notes (Signed)
Occupational Therapy Session Note  Patient Details  Name: Anthony Hayes MRN: [id number] Date of Birth: September 04, 1935  Today's Date: 04/15/2018 OT Individual Time: 1230-1300 OT Individual Time Calculation (min): 30 min   Skilled Therapeutic Interventions/Progress Updates: Patient participated in skilled OT working on his dynamic balance, trunk strengthening and rotation and bilateral upper extremity strength and ROM.   Patient was very winded with all activities and not able to increase AROM on either arm past 80degrees shoulder abduction and no more than AROM shoulder flexion past 110 degrees.  Patient was left seated in wheelchair next to this bed with call bell witinreach and demonstrated how to use it and with his safety belt and chair alarm engaged.     Therapy Documentation Precautions:  Precautions Precautions: Fall Precaution Comments: L side hemiplegia and ataxia Restrictions Weight Bearing Restrictions: No  Pain: Pain Assessment Pain Scale: 0-10 Pain Score: 0-No pain  See Function Navigator for Current Functional Status.   Therapy/Group: Individual Therapy  Alfredia Ferguson [facility] 04/15/2018, 4:16 PM

## 2018-04-15 NOTE — Progress Notes (Signed)
Speech Language Pathology Daily Session Note  Patient Details  Name: Anthony Hayes MRN: [id number] Date of Birth: [birth year]  Today's Date: 04/15/2018 SLP Individual Time: 1435-1500 SLP Individual Time Calculation (min): 25 min  Short Term Goals: Week 2: SLP Short Term Goal 1 (Week 2): Pt will sustain hiis attention to functional tasks for 10 minute intervals with min verbal cues for redirection.  SLP Short Term Goal 2 (Week 2): Pt will complete basic familiar tasks with min assist verbal cues for functional problem solving.  SLP Short Term Goal 3 (Week 2): Pt will recall daily information with mod assist verbal cues for use of external aids.  SLP Short Term Goal 4 (Week 2): Pt will return demonstration of at least 2 safety precautions during functional tasks with min assist verbal cues.   Skilled Therapeutic Interventions:  Pt was seen for skilled ST targeting cognitive goals.  Pt presented with flat affect and verbalized feeling "down" at his current loss of independence.  Pt responded well to encouragement and reassurance and was agreeable to participate in therapy.  SLP facilitated the session with functional conversations regarding discharge planning.  Pt feels he will be able to return to assisted living facility even though team is recommending SNF.  Pt's mentation subjectively appears clearer in comparison to initial evaluation; however, awareness of his limitiations still is diminished.  Pt sustained his attention to conversation for ~20  Minutes with no cues needed for redirection.  Pt was returned to room and left in wheelchair with chair alarm set and quick release belt donned.  Continue per current plan of care.    Function:  Eating Eating                 Cognition Comprehension Comprehension assist level: Understands basic 90% of the time/cues < 10% of the time  Expression   Expression assist level: Expresses basic 90% of the time/requires cueing < 10% of the time.  Social  Interaction Social Interaction assist level: Interacts appropriately 90% of the time - Needs monitoring or encouragement for participation or interaction.  Problem Solving Problem solving assist level: Solves basic 75 - 89% of the time/requires cueing 10 - 24% of the time  Memory Memory assist level: Recognizes or recalls 50 - 74% of the time/requires cueing 25 - 49% of the time    Pain Pain Assessment Pain Scale: 0-10 Pain Score: 0-No pain  Therapy/Group: Individual Therapy  Shanessa Hodak, Selinda Orion 04/15/2018, 3:55 PM

## 2018-04-15 NOTE — Progress Notes (Signed)
Occupational Therapy Session Note  Patient Details  Name: Anthony Hayes MRN: [id number] Date of Birth: 1935/05/24  Today's Date: 04/15/2018 OT Individual Time: 8182-9937 OT Individual Time Calculation (min): 60 min    Short Term Goals: Week 2:  OT Short Term Goal 1 (Week 2): STG=LTG due to LOS  Skilled Therapeutic Interventions/Progress Updates:    Pt seen for OT ADL bathing/dressing session. Pt in supine upon arrival with RN present administering morning meds, pt agreeable to tx session and denying pain. He transferred to sitting EOB from flat bed in simulation of home environment, with VCs for technique and encouragement for independence. Pt noted to have been incontinent of urine throughout the night, shirt and bedding soaked of urine. Pt agreeable to then shower. He ambulated throughout room using RW with min A, VCs for RW management. He bathed seated on tub bench, standing with use of grab bar to complete pericare/buttock hygiene with steadying assist. Min cuing required throughout bathing to advance to next body part.  He returned to sitting on toilet to dress. Pt adamant to stand to don shirt despite cuing and education regarding fall right from therapist-  able to complete task with steadying assist.  Following seated rest break, he completed grooming tasks standing at sink with CGA. Pt returned to w/c at end of session, left seated with QRB donned, chair alarm activated and all needs in reach awaiting hand off to PT.  Pt with questions regarding d/c disposition. Pt made aware of current plans and working to find 24 hr supervision-min A at d/c. Pt disagrees with recommendation of need for 24 hr assist. Provided education and examples of reasons for assist and pt voiced understanding.   Therapy Documentation Precautions:  Precautions Precautions: Fall Precaution Comments: L side hemiplegia and ataxia Restrictions Weight Bearing Restrictions: No Pain:   No/denies pain  See Function  Navigator for Current Functional Status.   Therapy/Group: Individual Therapy  Grover Robinson L 04/15/2018, 7:03 AM

## 2018-04-15 NOTE — Progress Notes (Signed)
Physical Therapy Session Note  Patient Details  Name: Anthony Hayes MRN: [id number] Date of Birth: [birth year]  Today's Date: 04/15/2018 PT Individual Time: 1114-1208 PT Individual Time Calculation (min): 54 min   Short Term Goals: Week 2:  PT Short Term Goal 1 (Week 2): STG=LTG due to ELOS  Skilled Therapeutic Interventions/Progress Updates:  Pt received in w/c & agreeable to tx, no c/o pain. Pt completes car transfer at low sedan simulated height with RW & min assist; pt requires use of UE to assist LE into car. Gait training with RW up to 100 ft with min assist with max cuing for increased toe clearance and heel strike with fair return demo. Pt is easily distracted by other people in the environment and exhibits impaired safety awareness and impaired obstacle avoidance, requiring cuing for safe path and to stay inside the base of RW. Gait training without AD x 30 ft with mod increasing to max assist with task focusing on dynamic balance; pt with increasing anterior lean as activity progressed and demonstrates R trendelenburg gait with impaired weight shifting L<>R. Transitioned to supine on mat table and pt performed BLE bridging with adductor hold and L/R LE single leg bridging to focus on strengthening & NMR with therapist providing max cuing for proper technique. Pt transfers EOM>w/c via squat pivot with impaired clearance and safety awareness requiring cuing to completely turn seat. Pt propels w/c with BLE with task focusing on strengthening & NMR. At end of session pt left in w/c in room with quick release belt & chair alarm donned, all needs within reach.   Therapy Documentation Precautions:  Precautions Precautions: Fall Precaution Comments: L side hemiplegia and ataxia Restrictions Weight Bearing Restrictions: No   See Function Navigator for Current Functional Status.   Therapy/Group: Individual Therapy  Waunita Schooner 04/15/2018, 12:10 PM

## 2018-04-15 NOTE — Progress Notes (Signed)
Physical Therapy Session Note  Patient Details  Name: Anthony Hayes MRN: [id number] Date of Birth: July 27, 1935  Today's Date: 04/15/2018 PT Individual Time: 5974-1638 PT Individual Time Calculation (min): 30 min   Short Term Goals: Week 2:  PT Short Term Goal 1 (Week 2): STG=LTG due to ELOS  Skilled Therapeutic Interventions/Progress Updates:    Session focused on NMR for balance re-training and coordination including toe taps on 4" step, heel/toe raises x 10 reps each with UE support for balance, standing on wedge for stretching and balance retraining without UE support x 1 min x 2 reps each. TUG administered for fall risk assessment with RW average of 3 trials = 42.33 sec indicating high fall risk. Dynamic gait training with RW x 50' through obstacle course to focus on home environment mobility with decreased L foot clearance noted and max cues for positioning of RW safely during turns. Pt requires cues for transfers for safety to not pull up on RW or sit before making sure he is close enough to the seat. Overall min assist with RW this session physically for balance and transitional movements.   Therapy Documentation Precautions:  Precautions Precautions: Fall Precaution Comments: L side hemiplegia and ataxia Restrictions Weight Bearing Restrictions: No  Pain: Pain Assessment Pain Scale: 0-10 Pain Score: 0-No pain   See Function Navigator for Current Functional Status.   Therapy/Group: Individual Therapy  Canary Brim Ivory Broad, PT, DPT  04/15/2018, 10:29 AM

## 2018-04-15 NOTE — Progress Notes (Signed)
Subjective/Complaints:  No issues overnite, discussed need for repeat bloodwork  Review of systems: limited due to behavior, however denies chest pain, shortness of breath, nausea, vomiting, diarrhea  Objective: Vital Signs: Blood pressure (!) 111/92, pulse (!) 57, temperature 97.6 F (36.4 C), temperature source Oral, resp. rate 19, height 5' 7"  (1.702 m), weight 76 kg (167 lb 8.8 oz), SpO2 96 %. No results found. No results found for this or any previous visit (from the past 72 hour(s)).   HEENT: normocephalic. Atraumatic. Cardio: RRR and no JVD Resp: CTA B/L and unlabored GI: BS positive and ND Skin:   Intact. Warm and dry. Neuro: sleepy. Motor 3+/5 Left delt, Biceps , triceps and grip (stable,? Participation) 3+/5 HF, KE ADF (stable,? Participation) 4/5 RUE (?Participation) Musc/Skel:  No edema or tenderness in extremities. Gen NAD, vital signs reviewed. Psychiatric: Withdrawn  Assessment/Plan: 1. Functional deficits secondary to RIght BG infarction which require 3+ hours per day of interdisciplinary therapy in a comprehensive inpatient rehab setting. Physiatrist is providing close team supervision and 24 hour management of active medical problems listed below. Physiatrist and rehab team continue to assess barriers to discharge/monitor patient progress toward functional and medical goals. FIM: Function - Bathing Bathing activity did not occur: Refused Position: Wheelchair/chair at sink Body parts bathed by patient: Right arm, Left arm, Chest, Abdomen, Front perineal area, Right upper leg, Left upper leg, Right lower leg, Left lower leg, Buttocks Body parts bathed by helper: Back Assist Level: Touching or steadying assistance(Pt > 75%)  Function- Upper Body Dressing/Undressing What is the patient wearing?: Pull over shirt/dress Pull over shirt/dress - Perfomed by patient: Thread/unthread right sleeve, Thread/unthread left sleeve, Pull shirt over trunk, Put head through  opening Pull over shirt/dress - Perfomed by helper: Put head through opening Assist Level: Supervision or verbal cues Function - Lower Body Dressing/Undressing What is the patient wearing?: Pants, Non-skid slipper socks Position: Wheelchair/chair at sink Pants- Performed by patient: Thread/unthread right pants leg, Thread/unthread left pants leg, Pull pants up/down Pants- Performed by helper: Thread/unthread right pants leg, Thread/unthread left pants leg, Pull pants up/down Non-skid slipper socks- Performed by patient: Don/doff right sock, Don/doff left sock Socks - Performed by helper: Don/doff right sock, Don/doff left sock Shoes - Performed by helper: Don/doff right shoe, Don/doff left shoe, Fasten left, Fasten right Assist for footwear: Partial/moderate assist Assist for lower body dressing: Touching or steadying assistance (Pt > 75%)  Function - Toileting Toileting steps completed by patient: Adjust clothing after toileting, Adjust clothing prior to toileting, Performs perineal hygiene Toileting steps completed by helper: Performs perineal hygiene Toileting Assistive Devices: Grab bar or rail Assist level: Supervision or verbal cues  Function - Air cabin crew transfer activity did not occur: Safety/medical concerns Toilet transfer assistive device: Elevated toilet seat/BSC over toilet, Grab bar, Walker Assist level to toilet: Touching or steadying assistance (Pt > 75%) Assist level from toilet: Touching or steadying assistance (Pt > 75%)  Function - Chair/bed transfer Chair/bed transfer method: Stand pivot Chair/bed transfer assist level: Touching or steadying assistance (Pt > 75%) Chair/bed transfer assistive device: Walker Chair/bed transfer details: Tactile cues for initiation, Tactile cues for sequencing, Tactile cues for weight shifting, Tactile cues for placement, Verbal cues for sequencing, Verbal cues for technique, Verbal cues for precautions/safety, Verbal cues  for safe use of DME/AE, Manual facilitation for weight shifting, Manual facilitation for placement  Function - Locomotion: Wheelchair Type: Manual Max wheelchair distance: 30  Assist Level: Touching or steadying assistance (Pt > 75%) Wheel  50 feet with 2 turns activity did not occur: Safety/medical concerns(fatigue) Wheel 150 feet activity did not occur: Safety/medical concerns Function - Locomotion: Ambulation Assistive device: Walker-rolling Max distance: 50 ft Assist level: Moderate assist (Pt 50 - 74%) Assist level: Moderate assist (Pt 50 - 74%) Walk 50 feet with 2 turns activity did not occur: Safety/medical concerns Walk 150 feet activity did not occur: Safety/medical concerns Walk 10 feet on uneven surfaces activity did not occur: Safety/medical concerns  Function - Comprehension Comprehension: Auditory Comprehension assist level: Understands basic 75 - 89% of the time/ requires cueing 10 - 24% of the time, Understands basic 50 - 74% of the time/ requires cueing 25 - 49% of the time  Function - Expression Expression: Verbal Expression assist level: Expresses basic 75 - 89% of the time/requires cueing 10 - 24% of the time. Needs helper to occlude trach/needs to repeat words.  Function - Social Interaction Social Interaction assist level: Interacts appropriately 75 - 89% of the time - Needs redirection for appropriate language or to initiate interaction.  Function - Problem Solving Problem solving assist level: Solves basic 50 - 74% of the time/requires cueing 25 - 49% of the time  Function - Memory Memory assist level: Recognizes or recalls 50 - 74% of the time/requires cueing 25 - 49% of the time Patient normally able to recall (first 3 days only): That he or she is in a hospital, Current season  Medical Problem List and Plan: 1.  Altered mental status with decreased functional mobility secondary to right basal ganglia infarction.  Plan aspirin and Plavix times 3 weeks then  Plavix alone  Continue CIR PT, OT, SLP 2.  DVT Prophylaxis/Anticoagulation: Subcutaneous heparin. 3. Pain Management: Tylenol as needed 4. Mood/mild dementia.  Tofranil 25 mg nightly, anticholinergic effect may worsen cognition d/ced tofranil, trial melatonin for sleep Will try to get baseline from family 5. Neuropsych: This patient is capable of making decisions on his own behalf.- per SLP def appear chronic   6. Skin/Wound Care: Routine skin checks 7. Fluids/Electrolytes/Nutrition: Routine I/O's 8.  CKD stage III.     Creatinine 1.20 on 4/11   Continue to monitor 9.  Hypertension.  HCTZ 25 mg daily.  Monitor with increased mobility    Vitals:   04/14/18 2153 04/15/18 0517  BP: 109/70 (!) 111/92  Pulse: (!) 55 (!) 57  Resp: 16 19  Temp: 98.4 F (36.9 C) 97.6 F (36.4 C)  SpO2: 97% 96%  Controlled 4/19 10.  History of BPH.  Currently on both Flomax and Ditropan as prior to admission.     PVRs ordered 11.  UTI/leukocytosis.   urine culture shows Klebsiella, change to Keflex   Blood cultures x2 NG final  on 4/15   WBCs 12.0 on 4/11   Afebrile   Will recheck CBC today 12.  Hyperlipidemia.  Pravachol 13.  Multiple colon resections due to Crohn's disease.  Continue to monitor 14.  Hypothyroidism.  TSH 4.601.Synthroid, prior dose 0.1m, now on .273mwill resume .62m98mose, HR may slowly  increase 15.  Vitamin B12 deficiency.  Continue vitamin B12 16.  Knee and hip contracture, amb with walker at home 17.  Hypokalemia supplementing with KCl 20 mEq/day   Potassium 3.8 on 4/11   Continue to monitor 18. Hyponatremia  Sodium 134 on 4/11, recheck BMET today  Continue to monitor 19.  Asymptomatic brady previously documented sinus brady on EKG ( also R and L BBB) LOS (Days) 9 A FACE TO FACE EVALUATION  WAS PERFORMED  Charlett Blake 04/15/2018, 7:34 AM

## 2018-04-16 ENCOUNTER — Inpatient Hospital Stay (HOSPITAL_COMMUNITY): Payer: Medicare Other | Admitting: Physical Therapy

## 2018-04-16 ENCOUNTER — Inpatient Hospital Stay (HOSPITAL_COMMUNITY): Payer: Medicare Other | Admitting: Occupational Therapy

## 2018-04-16 LAB — CBC
HEMATOCRIT: 41.5 % (ref 39.0–52.0)
HEMOGLOBIN: 14.1 g/dL (ref 13.0–17.0)
MCH: 30 pg (ref 26.0–34.0)
MCHC: 34 g/dL (ref 30.0–36.0)
MCV: 88.3 fL (ref 78.0–100.0)
Platelets: 498 10*3/uL — ABNORMAL HIGH (ref 150–400)
RBC: 4.7 MIL/uL (ref 4.22–5.81)
RDW: 14.5 % (ref 11.5–15.5)
WBC: 8.2 10*3/uL (ref 4.0–10.5)

## 2018-04-16 LAB — BASIC METABOLIC PANEL
Anion gap: 11 (ref 5–15)
BUN: 31 mg/dL — ABNORMAL HIGH (ref 6–20)
CALCIUM: 9.6 mg/dL (ref 8.9–10.3)
CO2: 20 mmol/L — ABNORMAL LOW (ref 22–32)
Chloride: 103 mmol/L (ref 101–111)
Creatinine, Ser: 1.44 mg/dL — ABNORMAL HIGH (ref 0.61–1.24)
GFR calc Af Amer: 51 mL/min — ABNORMAL LOW (ref >60)
GFR, EST NON AFRICAN AMERICAN: 44 mL/min — AB (ref >60)
GLUCOSE: 97 mg/dL (ref 65–99)
POTASSIUM: 4 mmol/L (ref 3.5–5.1)
SODIUM: 134 mmol/L — AB (ref 135–145)

## 2018-04-16 MED ORDER — HYDROCHLOROTHIAZIDE 25 MG PO TABS
12.5000 mg | ORAL_TABLET | Freq: Every day | ORAL | Status: DC
  Administered 2018-04-17: 12.5 mg via ORAL
  Filled 2018-04-16: qty 1

## 2018-04-16 MED ORDER — SODIUM CHLORIDE 0.45 % IV BOLUS
500.0000 mL | Freq: Once | INTRAVENOUS | Status: AC
  Administered 2018-04-16: 500 mL via INTRAVENOUS

## 2018-04-16 NOTE — Progress Notes (Signed)
Occupational Therapy Session Note  Patient Details  Name: Anthony Hayes MRN: [id number] Date of Birth: Apr 24, 1935  Today's Date: 04/16/2018 OT Individual Time: 1250-1320 OT Individual Time Calculation (min): 30 min    Short Term Goals: Week 2:  OT Short Term Goal 1 (Week 2): STG=LTG due to LOS  Skilled Therapeutic Interventions/Progress Updates:    Pt seen for OT session focusing on orientation, and ADL re-training. Pt sitting up in w/c upon arrival, alert and agreeable to tx session and denying pain. He was oriented x4 with increased time though stated "they claim I had a stroke". Pt desiring to see MRI images of brain, pictures shown though no detail provided outside OT scope of practice. He ambulated throughout room with RW and close supervision- occasional steadying assist. Pt completed toileting task with steadying assist. Returned to sink to complete grooming tasks in standing, tolerating ~4 minutes in standing before requiring seated rest break. Worked with pt on reading weekday therapy schedule and orient pt to regular PT/OT team as pt unable to recall this therapist despite working together daily for past 10 days. With increased time and min cuing pt able to correctly read and identify three primary therapy team members. Pt desiring to return to bed at end of session, HHA stand pivot to bed. Pt left in supine with all needs in reach, bed alarm on.   Therapy Documentation Precautions:  Precautions Precautions: Fall Precaution Comments: L side hemiplegia and ataxia Restrictions Weight Bearing Restrictions: No   See Function Navigator for Current Functional Status.   Therapy/Group: Individual Therapy  [name] L 04/16/2018, 7:47 AM

## 2018-04-16 NOTE — Progress Notes (Signed)
Physical Therapy Session Note  Patient Details  Name: Anthony Hayes MRN: [id number] Date of Birth: 1935-09-20  Today's Date: 04/16/2018 PT Individual Time: 0950-1015 PT Individual Time Calculation (min): 25 min   Short Term Goals: Week 1:  PT Short Term Goal 1 (Week 1): Pt will perform bed mobility with S PT Short Term Goal 1 - Progress (Week 1): Not met PT Short Term Goal 2 (Week 1): Pt will perform stand pivot transfer minA PT Short Term Goal 2 - Progress (Week 1): Met PT Short Term Goal 3 (Week 1): Pt will ambulate x50' modA PT Short Term Goal 3 - Progress (Week 1): Met PT Short Term Goal 4 (Week 1): Pt will perform w/c propulsion x50' with minA PT Short Term Goal 4 - Progress (Week 1): Not met Week 2:  PT Short Term Goal 1 (Week 2): STG=LTG due to ELOS  Skilled Therapeutic Interventions/Progress Updates:   Pt received supine in bed and agreeable to PT. Supine>sit transfer with min assist and moderate cues for safety. PT assisted pt to don shoes. PT treatment focused on Gait training and endurance. PT instructed pt in gait to day room with rollator x 186f with supervision assist overall with intermittent tactile cues for improved gluteal activation to prevent hip drop. Additional gait training with rollator in day room x 1038fwith supervision assist and cues for posture. UBE endurance training 2 x 4 min with rest break between bouts. [facility] returned to [facility] reach and all needs met.         Therapy Documentation Precautions:  Precautions Precautions: Fall Precaution Comments: L side hemiplegia and ataxia Restrictions Weight Bearing Restrictions: No Vital Signs: Therapy Vitals Temp: 97.6 F (36.4 C) Temp Source: Oral Pulse Rate: (!) 51 Resp: 18 BP: 117/68 Patient Position (if appropriate): Sitting Oxygen Therapy SpO2: 99 % O2 Device: Room Air   See Function Navigator for Current Functional Status.   Therapy/Group: Individual  Therapy  AuLorie Phenix/20/2019, 5:31 PM

## 2018-04-16 NOTE — Progress Notes (Signed)
Subjective/Complaints:  Looks brighter this am  Review of systems: limited due to behavior, however denies chest pain, shortness of breath, nausea, vomiting, diarrhea  Objective: Vital Signs: Blood pressure 122/76, pulse (!) 52, temperature (!) 97.4 F (36.3 C), temperature source Oral, resp. rate 18, height 5' 7"  (1.702 m), weight 76 kg (167 lb 8.8 oz), SpO2 95 %. No results found. No results found for this or any previous visit (from the past 72 hour(s)).   HEENT: normocephalic. Atraumatic. Cardio: RRR and no JVD Resp: CTA B/L and unlabored GI: BS positive and ND Skin:   Intact. Warm and dry. Neuro: sleepy. Motor 3+/5 Left delt, Biceps , triceps and grip (stable,? Participation) 3+/5 HF, KE ADF (stable,? Participation) 4/5 RUE (?Participation) Musc/Skel:  No edema or tenderness in extremities. Gen NAD, vital signs reviewed. Psychiatric: Withdrawn  Assessment/Plan: 1. Functional deficits secondary to RIght BG infarction which require 3+ hours per day of interdisciplinary therapy in a comprehensive inpatient rehab setting. Physiatrist is providing close team supervision and 24 hour management of active medical problems listed below. Physiatrist and rehab team continue to assess barriers to discharge/monitor patient progress toward functional and medical goals. FIM: Function - Bathing Bathing activity did not occur: Refused Position: Shower Body parts bathed by patient: Right arm, Left arm, Chest, Abdomen, Front perineal area, Right upper leg, Left upper leg, Right lower leg, Left lower leg, Buttocks Body parts bathed by helper: Back Assist Level: Touching or steadying assistance(Pt > 75%)  Function- Upper Body Dressing/Undressing What is the patient wearing?: Pull over shirt/dress Pull over shirt/dress - Perfomed by patient: Thread/unthread right sleeve, Thread/unthread left sleeve, Pull shirt over trunk, Put head through opening Pull over shirt/dress - Perfomed by helper:  Put head through opening Assist Level: Touching or steadying assistance(Pt > 75%) Function - Lower Body Dressing/Undressing What is the patient wearing?: Pants, Socks, Shoes Position: Wheelchair/chair at sink Pants- Performed by patient: Thread/unthread right pants leg, Thread/unthread left pants leg, Pull pants up/down Pants- Performed by helper: Thread/unthread right pants leg, Thread/unthread left pants leg, Pull pants up/down Non-skid slipper socks- Performed by patient: Don/doff right sock, Don/doff left sock Socks - Performed by patient: Don/doff right sock, Don/doff left sock Socks - Performed by helper: Don/doff right sock, Don/doff left sock Shoes - Performed by patient: Don/doff right shoe, Don/doff left shoe, Fasten right, Fasten left Shoes - Performed by helper: Don/doff right shoe, Don/doff left shoe, Fasten left, Fasten right Assist for footwear: Supervision/touching assist Assist for lower body dressing: Touching or steadying assistance (Pt > 75%)  Function - Toileting Toileting steps completed by patient: Adjust clothing after toileting, Adjust clothing prior to toileting, Performs perineal hygiene Toileting steps completed by helper: Performs perineal hygiene Toileting Assistive Devices: Grab bar or rail Assist level: Touching or steadying assistance (Pt.75%)  Function - Air cabin crew transfer activity did not occur: Safety/medical concerns Toilet transfer assistive device: Elevated toilet seat/BSC over toilet, Grab bar, Walker Assist level to toilet: Touching or steadying assistance (Pt > 75%) Assist level from toilet: Touching or steadying assistance (Pt > 75%)  Function - Chair/bed transfer Chair/bed transfer method: Squat pivot Chair/bed transfer assist level: Supervision or verbal cues Chair/bed transfer assistive device: Armrests Chair/bed transfer details: Verbal cues for sequencing, Verbal cues for precautions/safety  Function - Locomotion:  Wheelchair Type: Manual Max wheelchair distance: 100 ft (BLE) Assist Level: Supervision or verbal cues Wheel 50 feet with 2 turns activity did not occur: Safety/medical concerns(fatigue) Assist Level: Supervision or verbal cues Wheel 150 feet  activity did not occur: Safety/medical concerns Function - Locomotion: Ambulation Assistive device: Walker-rolling Max distance: 100 ft  Assist level: Touching or steadying assistance (Pt > 75%) Assist level: Touching or steadying assistance (Pt > 75%) Walk 50 feet with 2 turns activity did not occur: Safety/medical concerns Assist level: Touching or steadying assistance (Pt > 75%) Walk 150 feet activity did not occur: Safety/medical concerns Walk 10 feet on uneven surfaces activity did not occur: Safety/medical concerns  Function - Comprehension Comprehension: Auditory Comprehension assist level: Understands basic 90% of the time/cues < 10% of the time  Function - Expression Expression: Verbal Expression assist level: Expresses basic 90% of the time/requires cueing < 10% of the time.  Function - Social Interaction Social Interaction assist level: Interacts appropriately 90% of the time - Needs monitoring or encouragement for participation or interaction.  Function - Problem Solving Problem solving assist level: Solves basic 75 - 89% of the time/requires cueing 10 - 24% of the time  Function - Memory Memory assist level: Recognizes or recalls 50 - 74% of the time/requires cueing 25 - 49% of the time Patient normally able to recall (first 3 days only): That he or she is in a hospital  Medical Problem List and Plan: 1.  Altered mental status with decreased functional mobility secondary to right basal ganglia infarction.  Plan aspirin and Plavix times 3 weeks then Plavix alone  Continue CIR PT, OT, SLP 2.  DVT Prophylaxis/Anticoagulation: Subcutaneous heparin. 3. Pain Management: Tylenol as needed 4. Mood/mild dementia.  Tofranil 25 mg  nightly, anticholinergic effect may worsen cognition d/ced tofranil, trial melatonin for sleep Will try to get baseline from family 5. Neuropsych: This patient is capable of making decisions on his own behalf.- per SLP def appear chronic   6. Skin/Wound Care: Routine skin checks 7. Fluids/Electrolytes/Nutrition: Routine I/O's 8.  CKD stage III.     Creatinine 1.20 on 4/11   Continue to monitor 9.  Hypertension.  HCTZ 25 mg daily.  Monitor with increased mobility    Vitals:   04/15/18 1413 04/16/18 0525  BP: 111/83 122/76  Pulse: 63 (!) 52  Resp: 17 18  Temp: 98.2 F (36.8 C) (!) 97.4 F (36.3 C)  SpO2: 94% 95%  Controlled 4/20 10.  History of BPH.  Currently on both Flomax and Ditropan as prior to admission.     Monitor PVRs per nsg 11.  UTI/leukocytosis.   urine culture shows Klebsiella, change to Keflex   Blood cultures x2 NG final  on 4/15   WBCs 12.0 on 4/11   Afebrile   Will recheck CBC 4/20 12.  Hyperlipidemia.  Pravachol 13.  Multiple colon resections due to Crohn's disease.  Continue to monitor 14.  Hypothyroidism.  TSH 4.601.Synthroid, prior dose 0.67m, now on .243mwill resume .8m62mose, HR may slowly  increase 15.  Vitamin B12 deficiency.  Continue vitamin B12 16.  Knee and hip contracture, amb with walker at home 17.  Hypokalemia supplementing with KCl 20 mEq/day   Potassium 3.8 on 4/11   Continue to monitor 18. Hyponatremia  Sodium 134 on 4/11, recheck BMET 4/20  Continue to monitor 19.  Asymptomatic brady previously documented sinus brady on EKG ( also R and L BBB) LOS (Days) 10 A FACE TO FACE EVALUATION WAS PERFORMED  AndCharlett Blake20/2019, 9:38 AM

## 2018-04-17 LAB — BASIC METABOLIC PANEL
Anion gap: 10 (ref 5–15)
BUN: 32 mg/dL — AB (ref 6–20)
CHLORIDE: 103 mmol/L (ref 101–111)
CO2: 20 mmol/L — ABNORMAL LOW (ref 22–32)
CREATININE: 1.5 mg/dL — AB (ref 0.61–1.24)
Calcium: 9.4 mg/dL (ref 8.9–10.3)
GFR calc Af Amer: 49 mL/min — ABNORMAL LOW (ref >60)
GFR calc non Af Amer: 42 mL/min — ABNORMAL LOW (ref >60)
Glucose, Bld: 120 mg/dL — ABNORMAL HIGH (ref 65–99)
POTASSIUM: 4.4 mmol/L (ref 3.5–5.1)
SODIUM: 133 mmol/L — AB (ref 135–145)

## 2018-04-17 MED ORDER — SACCHAROMYCES BOULARDII 250 MG PO CAPS
250.0000 mg | ORAL_CAPSULE | Freq: Two times a day (BID) | ORAL | Status: DC
  Administered 2018-04-17 – 2018-04-22 (×11): 250 mg via ORAL
  Filled 2018-04-17 (×11): qty 1

## 2018-04-17 NOTE — Progress Notes (Signed)
Physical Therapy Session Note  Patient Details  Name: Anthony Hayes MRN: [id number] Date of Birth: 17-Jun-1935  Today's Date: 04/17/2018 PT Individual Time: 1300-1340 PT Individual Time Calculation (min): 40 min   Short Term Goals: Week 2:  PT Short Term Goal 1 (Week 2): STG=LTG due to ELOS  Skilled Therapeutic Interventions/Progress Updates:    Pt seated in w/c in dayroom with daughter present for entire session. Session focused on gait training and endurance with use of rollator. Pt ambulated x 160 ft with supervision and verbal cues for L foot clearance/heel strike. Pt becomes easily frustrated with objects in his way and pushes his rollator into them, verbal cues for safety and behavior. Pt's daughter asking about AFO to help with foot clearance, this therapist suggested possibly a toe up brace which would be more low profile. Trial of gait x 100 ft using rollator with toe up brace for improved foot clearance along with verbal cues for heel strike,supervision. Pt's daughter trained on tranfers and ambulation within the room this session, therapist providing education on appropriate cues for the pt. Pt ambulated 2 x 80 ft with rollator and sits on rollator seat for rest break between bouts, increased cueing for awareness during turn to sit. Pt ambulated back to w/c and left seated with daughter present.   Therapy Documentation Precautions:  Precautions Precautions: Fall Precaution Comments: L side hemiplegia and ataxia Restrictions Weight Bearing Restrictions: No   See Function Navigator for Current Functional Status.   Therapy/Group: Individual Therapy  Netta Corrigan, PT, DPT 04/17/2018, 1:38 PM

## 2018-04-17 NOTE — Progress Notes (Signed)
Occupational Therapy Session Note  Patient Details  Name: Anthony Hayes MRN: [id number] Date of Birth: [birth year]  Today's Date: 04/17/2018 OT Individual Time: 0700-0800 OT Individual Time Calculation (min): 60 min    Short Term Goals: Week 1:  OT Short Term Goal 1 (Week 1): Pt will complete stand pivot transfer with mod A using LRAD OT Short Term Goal 1 - Progress (Week 1): Met OT Short Term Goal 2 (Week 1): Pt will don shirt with set-up/ VCs OT Short Term Goal 2 - Progress (Week 1): Met OT Short Term Goal 3 (Week 1): Pt will don shoes/socks with min A using AE PRN OT Short Term Goal 3 - Progress (Week 1): Met OT Short Term Goal 4 (Week 1): Pt will complete 2/3 toileting tasks with steadying assist OT Short Term Goal 4 - Progress (Week 1): Met OT Short Term Goal 5 (Week 1): Pt will complete 1 grooming task standing at sink with steadying assist in order to increase functional activity tolerance OT Short Term Goal 5 - Progress (Week 1): Met  Skilled Therapeutic Interventions/Progress Updates:    1:1. Pt received EOB with NT present reporting need to toilet. Pt wet from incontienent bladder in brief. Pt transfers with supervision and no AD using grab bars or bed rails EOB>w/c<>BSC with VC for hand placemetn. Pt voids bladder in toilet. Pt sits at sink with VC for initiation and sequencing of bathing tasks. Pt stands with supervision for peri care/buttock hygiene at sink. Pt lethargic throughout session requiring VC and tactile cues for arousal and attention to task. Pt completes UB/LB dressing seated at sink with supervsion scanning L to locate clothing items. Pt requires VC to engage core for anterior trunk flexion prior to standing  Instead of pulling on sink. PT able to cross BLE into figure 4 after unsuccessful first attempt to thread LLE by reaching down. Pt changes socks by crossing BLE into figure 4. Pt stands to brush teeth with supervision with increased time for termination of task. Pt   Requires question cues to turn off water. Exited session with pt seated in w/c, call light in reach, QRB/chair alarm on and all needs met  Therapy Documentation Precautions:  Precautions Precautions: Fall Precaution Comments: L side hemiplegia and ataxia Restrictions Weight Bearing Restrictions: No  See Function Navigator for Current Functional Status.   Therapy/Group: Individual Therapy  Tonny Branch 04/17/2018, 7:15 AM

## 2018-04-18 ENCOUNTER — Inpatient Hospital Stay (HOSPITAL_COMMUNITY): Payer: Medicare Other | Admitting: Physical Therapy

## 2018-04-18 ENCOUNTER — Inpatient Hospital Stay (HOSPITAL_COMMUNITY): Payer: Medicare Other | Admitting: Occupational Therapy

## 2018-04-18 ENCOUNTER — Inpatient Hospital Stay (HOSPITAL_COMMUNITY): Payer: Medicare Other | Admitting: Speech Pathology

## 2018-04-18 LAB — BASIC METABOLIC PANEL
ANION GAP: 14 (ref 5–15)
BUN: 36 mg/dL — ABNORMAL HIGH (ref 6–20)
CALCIUM: 9.1 mg/dL (ref 8.9–10.3)
CO2: 14 mmol/L — AB (ref 22–32)
CREATININE: 1.37 mg/dL — AB (ref 0.61–1.24)
Chloride: 105 mmol/L (ref 101–111)
GFR, EST AFRICAN AMERICAN: 55 mL/min — AB (ref >60)
GFR, EST NON AFRICAN AMERICAN: 47 mL/min — AB (ref >60)
GLUCOSE: 100 mg/dL — AB (ref 65–99)
Potassium: 4.1 mmol/L (ref 3.5–5.1)
Sodium: 133 mmol/L — ABNORMAL LOW (ref 135–145)

## 2018-04-18 NOTE — Progress Notes (Signed)
Occupational Therapy Session Note  Patient Details  Name: Anthony Hayes MRN: [id number] Date of Birth: 1935-12-09  Today's Date: 04/18/2018 OT Individual Time: 6962-9528 OT Individual Time Calculation (min): 55 min    Short Term Goals: Week 2:  OT Short Term Goal 1 (Week 2): STG=LTG due to LOS  Skilled Therapeutic Interventions/Progress Updates:    Pt seen for OT ADL bathing/dressing session. Pt lethargic in bed upon arrival with RN present attempting to re-orient pt. With encouragement, pt agreeable to tx session. He transferd to EOB with min A and max cuing fpr sequencing and technique.  He ambulated throughout room with RW and CGA, min cuing for RW management in functional context. Pt with soiled brief, and unable to void on toilet. Returned to w/c to complete bathing and dressing. He required cuing for intiiation though able to reach and bathe all areas. Upon standing to complete pericare/buttock hygiene pt incontinent of urine. He compelted pericare/buttock hygiene and clothing management with steadying assist. Pt left sitting up in w/c at end of session, set-up with breakfast tray, QRB and chair alarm on, all needs in reach.  Pt's son in law present for portion of session. Discussed rehab goals, pt's PLOF vs CLOF and d/c planning. He reports pt is close to baseline level. CSW made aware and therpist put pt's family member in contact with CSW.   Therapy Documentation Precautions:  Precautions Precautions: Fall Precaution Comments: L side hemiplegia and ataxia Restrictions Weight Bearing Restrictions: No Pain:  No/denies pain  See Function Navigator for Current Functional Status.   Therapy/Group: Individual Therapy  Asser Lucena L 04/18/2018, 7:11 AM

## 2018-04-18 NOTE — Progress Notes (Signed)
Subjective/Complaints:    Review of systems: limited due to behavior, however denies chest pain, shortness of breath, nausea, vomiting, diarrhea  Objective: Vital Signs: Blood pressure 135/66, pulse (!) 47, temperature 97.7 F (36.5 C), temperature source Oral, resp. rate 18, height 5' 7"  (1.702 m), weight 76 kg (167 lb 8.8 oz), SpO2 98 %. No results found. Results for orders placed or performed during the hospital encounter of 04/06/18 (from the past 72 hour(s))  Basic metabolic panel     Status: Abnormal   Collection Time: 04/16/18 10:10 AM  Result Value Ref Range   Sodium 134 (L) 135 - 145 mmol/L   Potassium 4.0 3.5 - 5.1 mmol/L   Chloride 103 101 - 111 mmol/L   CO2 20 (L) 22 - 32 mmol/L   Glucose, Bld 97 65 - 99 mg/dL   BUN 31 (H) 6 - 20 mg/dL   Creatinine, Ser 1.44 (H) 0.61 - 1.24 mg/dL   Calcium 9.6 8.9 - 10.3 mg/dL   GFR calc non Af Amer 44 (L) >60 mL/min   GFR calc Af Amer 51 (L) >60 mL/min    Comment: (NOTE) The eGFR has been calculated using the CKD EPI equation. This calculation has not been validated in all clinical situations. eGFR's persistently <60 mL/min signify possible Chronic Kidney Disease.    Anion gap 11 5 - 15    Comment: Performed at [facility] [address], [place], [place] [address]  CBC     Status: Abnormal   Collection Time: 04/16/18 10:10 AM  Result Value Ref Range   WBC 8.2 4.0 - 10.5 K/uL   RBC 4.70 4.22 - 5.81 MIL/uL   Hemoglobin 14.1 13.0 - 17.0 g/dL   HCT 41.5 39.0 - 52.0 %   MCV 88.3 78.0 - 100.0 fL   MCH 30.0 26.0 - 34.0 pg   MCHC 34.0 30.0 - 36.0 g/dL   RDW 14.5 11.5 - 15.5 %   Platelets 498 (H) 150 - 400 K/uL    Comment: Performed at [facility], [place] [address], [place], [place] [address]  Basic metabolic panel     Status: Abnormal   Collection Time: 04/17/18  8:24 AM  Result Value Ref Range   Sodium 133 (L) 135 - 145 mmol/L   Potassium 4.4 3.5 - 5.1 mmol/L   Chloride 103 101 - 111 mmol/L   CO2 20 (L) 22 -  32 mmol/L   Glucose, Bld 120 (H) 65 - 99 mg/dL   BUN 32 (H) 6 - 20 mg/dL   Creatinine, Ser 1.50 (H) 0.61 - 1.24 mg/dL   Calcium 9.4 8.9 - 10.3 mg/dL   GFR calc non Af Amer 42 (L) >60 mL/min   GFR calc Af Amer 49 (L) >60 mL/min    Comment: (NOTE) The eGFR has been calculated using the CKD EPI equation. This calculation has not been validated in all clinical situations. eGFR's persistently <60 mL/min signify possible Chronic Kidney Disease.    Anion gap 10 5 - 15    Comment: Performed at [facility] [address] [place], [place] [address]  Basic metabolic panel     Status: Abnormal   Collection Time: 04/18/18  6:12 AM  Result Value Ref Range   Sodium 133 (L) 135 - 145 mmol/L    Comment: POST-ULTRACENTRIFUGATION   Potassium 4.1 3.5 - 5.1 mmol/L   Chloride 105 101 - 111 mmol/L   CO2 14 (L) 22 - 32 mmol/L  Glucose, Bld 100 (H) 65 - 99 mg/dL   BUN 36 (H) 6 - 20 mg/dL   Creatinine, Ser 1.37 (H) 0.61 - 1.24 mg/dL   Calcium 9.1 8.9 - 10.3 mg/dL   GFR calc non Af Amer 47 (L) >60 mL/min   GFR calc Af Amer 55 (L) >60 mL/min    Comment: (NOTE) The eGFR has been calculated using the CKD EPI equation. This calculation has not been validated in all clinical situations. eGFR's persistently <60 mL/min signify possible Chronic Kidney Disease.    Anion gap 14 5 - 15    Comment: Performed at [facility] [address] [place], [place] [address]     HEENT: normocephalic. Atraumatic. Cardio: RRR and no JVD Resp: CTA B/L and unlabored GI: BS positive and ND Skin:   Intact. Warm and dry. Neuro: sleepy. Motor 3+/5 Left delt, Biceps , triceps and grip (stable,? Participation) 3+/5 HF, KE ADF (stable,? Participation) 4/5 RUE (?Participation) Musc/Skel:  No edema or tenderness in extremities. Gen NAD, vital signs reviewed. Psychiatric: Withdrawn  Assessment/Plan: 1. Functional deficits secondary to RIght BG infarction which require 3+ hours per day of interdisciplinary  therapy in a comprehensive inpatient rehab setting. Physiatrist is providing close team supervision and 24 hour management of active medical problems listed below. Physiatrist and rehab team continue to assess barriers to discharge/monitor patient progress toward functional and medical goals. FIM: Function - Bathing Bathing activity did not occur: Refused Position: Shower Body parts bathed by patient: Right arm, Left arm, Chest, Abdomen, Front perineal area, Right upper leg, Left upper leg, Right lower leg, Left lower leg, Buttocks Body parts bathed by helper: Back Assist Level: Touching or steadying assistance(Pt > 75%)  Function- Upper Body Dressing/Undressing What is the patient wearing?: Pull over shirt/dress Pull over shirt/dress - Perfomed by patient: Thread/unthread right sleeve, Thread/unthread left sleeve, Pull shirt over trunk, Put head through opening Pull over shirt/dress - Perfomed by helper: Put head through opening Assist Level: Touching or steadying assistance(Pt > 75%) Function - Lower Body Dressing/Undressing What is the patient wearing?: Pants, Socks, Shoes Position: Wheelchair/chair at sink Pants- Performed by patient: Thread/unthread right pants leg, Thread/unthread left pants leg, Pull pants up/down Pants- Performed by helper: Thread/unthread right pants leg, Thread/unthread left pants leg, Pull pants up/down Non-skid slipper socks- Performed by patient: Don/doff right sock, Don/doff left sock Socks - Performed by patient: Don/doff right sock, Don/doff left sock Socks - Performed by helper: Don/doff right sock, Don/doff left sock Shoes - Performed by patient: Don/doff right shoe, Don/doff left shoe, Fasten right, Fasten left Shoes - Performed by helper: Don/doff right shoe, Don/doff left shoe, Fasten left, Fasten right Assist for footwear: Supervision/touching assist Assist for lower body dressing: Touching or steadying assistance (Pt > 75%)  Function -  Toileting Toileting steps completed by patient: Adjust clothing after toileting, Adjust clothing prior to toileting, Performs perineal hygiene Toileting steps completed by helper: Performs perineal hygiene Toileting Assistive Devices: Grab bar or rail Assist level: Touching or steadying assistance (Pt.75%)  Function - Air cabin crew transfer activity did not occur: Safety/medical concerns Toilet transfer assistive device: Elevated toilet seat/BSC over toilet, Grab bar, Walker Assist level to toilet: Touching or steadying assistance (Pt > 75%) Assist level from toilet: Touching or steadying assistance (Pt > 75%)  Function - Chair/bed transfer Chair/bed transfer method: Ambulatory Chair/bed transfer assist level: Supervision or verbal cues Chair/bed transfer assistive device: Walker Chair/bed transfer details: Verbal cues for sequencing, Verbal cues for precautions/safety  Function - Locomotion: Wheelchair Type: Manual Max wheelchair distance: 100 ft (BLE) Assist Level: Supervision or verbal cues Wheel 50 feet with 2 turns activity did not occur: Safety/medical concerns(fatigue) Assist Level: Supervision or verbal cues Wheel 150 feet activity did not occur: Safety/medical concerns Function - Locomotion: Ambulation Assistive device: Walker-rolling Max distance: 176f (rollator) Assist level: Supervision or verbal cues Assist level: Supervision or verbal cues Walk 50 feet with 2 turns activity did not occur: Safety/medical concerns Assist level: Supervision or verbal cues Walk 150 feet activity did not occur: Safety/medical concerns Assist level: Supervision or verbal cues Walk 10 feet on uneven surfaces activity did not occur: Safety/medical concerns  Function - Comprehension Comprehension: Auditory Comprehension assist level: Understands basic 75 - 89% of the time/ requires cueing 10 - 24% of the time, Understands basic 50 - 74% of the time/ requires cueing 25 - 49% of  the time  Function - Expression Expression: Verbal Expression assist level: Expresses basic 75 - 89% of the time/requires cueing 10 - 24% of the time. Needs helper to occlude trach/needs to repeat words.  Function - Social Interaction Social Interaction assist level: Interacts appropriately 75 - 89% of the time - Needs redirection for appropriate language or to initiate interaction.  Function - Problem Solving Problem solving assist level: Solves basic 50 - 74% of the time/requires cueing 25 - 49% of the time  Function - Memory Memory assist level: Recognizes or recalls 50 - 74% of the time/requires cueing 25 - 49% of the time Patient normally able to recall (first 3 days only): That he or she is in a hospital  Medical Problem List and Plan: 1.  Altered mental status with decreased functional mobility secondary to right basal ganglia infarction.  Plan aspirin and Plavix times 3 weeks then Plavix alone  Continue CIR PT, OT, SLP 2.  DVT Prophylaxis/Anticoagulation: Subcutaneous heparin. 3. Pain Management: Tylenol as needed 4. Mood/mild dementia.  Tofranil 25 mg nightly, anticholinergic effect may worsen cognition d/ced tofranil, trial melatonin for sleep Will try to get baseline from family 5. Neuropsych: This patient is capable of making decisions on his own behalf.- per SLP def appear chronic   6. Skin/Wound Care: Routine skin checks 7. Fluids/Electrolytes/Nutrition: Routine I/O's, I 4833m8.  CKD stage III.     Stable per labs 4/22   Continue to monitor 9.  Hypertension.  HCTZ 25 mg daily.  Monitor with increased mobility    Vitals:   04/17/18 1612 04/18/18 0433  BP: 118/75 135/66  Pulse: (!) 52 (!) 47  Resp: 18 18  Temp: 98.4 F (36.9 C) 97.7 F (36.5 C)  SpO2: 98% 98%  Controlled 4/22,  Given poor intake holding HCTZ 10.  History of BPH.  Currently on both Flomax and Ditropan as prior to admission.     Monitor PVRs per nsg 11.  UTI/leukocytosis.   urine culture shows  Klebsiella, change to Keflex   Blood cultures x2 NG final  on 4/15   WBCs 12.0 on 4/11   Afebrile   Will recheck CBC 4/20 12.  Hyperlipidemia.  Pravachol 13.  Multiple colon resections due to Crohn's disease.  Continue to monitor 14.  Hypothyroidism.  TSH 4.601.Synthroid, prior dose 0.67m63mnow on .267m76mll resume .67mg 38me, HR may slowly  increase 15.  Vitamin B12 deficiency.  Continue vitamin B12 16.  Knee and hip contracture, amb with walker at home 17.  Hypokalemia supplementing with KCl 20 mEq/day   Potassium 3.8 on 4/11  Continue to monitor 18. Hyponatremia  Sodium 134 on 4/11, recheck BMET 4/22 stable at 133  Continue to monitor 19.  Asymptomatic brady previously documented sinus brady on EKG ( also R and L BBB) LOS (Days) 12 A FACE TO FACE EVALUATION WAS PERFORMED  Charlett Blake 04/18/2018, 8:14 AM

## 2018-04-18 NOTE — Progress Notes (Signed)
Late entry. OT reported patient blew nose and noted blood with clots x's 1 this am. Patient reported this was the only time that has happened. Instructed patient to notify nursing if blowing nose had result of bleeding. Continue with plan of care . Mliss Sax

## 2018-04-18 NOTE — Progress Notes (Signed)
Speech Language Pathology Daily Session Note  Patient Details  Name: Anthony Hayes MRN: [id number] Date of Birth: 13-Feb-1935  Today's Date: 04/18/2018 SLP Individual Time: 7711-6579 SLP Individual Time Calculation (min): 60 min  Short Term Goals: Week 2: SLP Short Term Goal 1 (Week 2): Pt will sustain hiis attention to functional tasks for 10 minute intervals with min verbal cues for redirection.  SLP Short Term Goal 2 (Week 2): Pt will complete basic familiar tasks with min assist verbal cues for functional problem solving.  SLP Short Term Goal 3 (Week 2): Pt will recall daily information with mod assist verbal cues for use of external aids.  SLP Short Term Goal 4 (Week 2): Pt will return demonstration of at least 2 safety precautions during functional tasks with min assist verbal cues.   Skilled Therapeutic Interventions: Skilled treatment session focused on cognition goals. SLP facilitated session by providing Mod A cues to sustain attention to task for ~ 30 minutes d/t fatigue. Pt also required Mod A cues to complete game of checkers, pt will not task initiation or concern for task completion. Pt was returned to room, left upright in wheelchair with safety belt donned and chair alarm on with all needs within reach.      Function:   Cognition Comprehension Comprehension assist level: Understands basic 75 - 89% of the time/ requires cueing 10 - 24% of the time  Expression   Expression assist level: Expresses basic 75 - 89% of the time/requires cueing 10 - 24% of the time. Needs helper to occlude trach/needs to repeat words.  Social Interaction Social Interaction assist level: Interacts appropriately 75 - 89% of the time - Needs redirection for appropriate language or to initiate interaction.  Problem Solving Problem solving assist level: Solves basic 50 - 74% of the time/requires cueing 25 - 49% of the time;Solves basic 75 - 89% of the time/requires cueing 10 - 24% of the time  Memory Memory  assist level: Recognizes or recalls 50 - 74% of the time/requires cueing 25 - 49% of the time    Pain    Therapy/Group: Individual Therapy  Anthony Hayes 04/18/2018, 10:53 AM

## 2018-04-18 NOTE — Progress Notes (Signed)
Physical Therapy Session Note  Patient Details  Name: Anthony Hayes MRN: [id number] Date of Birth: 1935/02/01  Today's Date: 04/18/2018 PT Individual Time: 5681-2751 PT Individual Time Calculation (min): 72 min   Short Term Goals: Week 1:  PT Short Term Goal 1 (Week 1): Pt will perform bed mobility with S PT Short Term Goal 1 - Progress (Week 1): Not met PT Short Term Goal 2 (Week 1): Pt will perform stand pivot transfer minA PT Short Term Goal 2 - Progress (Week 1): Met PT Short Term Goal 3 (Week 1): Pt will ambulate x50' modA PT Short Term Goal 3 - Progress (Week 1): Met PT Short Term Goal 4 (Week 1): Pt will perform w/c propulsion x50' with minA PT Short Term Goal 4 - Progress (Week 1): Not met  Skilled Therapeutic Interventions/Progress Updates:   Pt received in w/c, agreeable to therapy and with no c/o pain. Pt transported via w/c to gym for time management. Pt educated on purpose and how to don/doff L foot up brace. Pt ambulated w/ rollator and L foot up brace 180 ft with supervision, demonstrating improved L toe clearance but still intermittently dragging L foot. Pt required verbal cuing to initiate heel strike, maintain upward gaze and increased bilateral step width. Pt ambulated w/ rollator through cones to simulate obstacle negotiation [id number] with close supervision>steady assist. Pt demonstrated increased scissoring and narrow BOS with cone navigation, but improved with increased distance between cones and verbal cuing to increased bilateral step width. Pt intermittently demonstrated L toe drag and trendelenburg gait but had no LOB. Pt ambulated 964fin same manner w/o L foot up brace attached and demonstrated increased L toe drag. Pt performed therex to strengthen B glutes, HS and core: sidelying clamsells w/ orange theraband, supine bridges, single leg bridges R and L. Pt was educated on use of rollator when needing to sit and take a break on rollator, including ensuring device is locked,  turning around and slowly sitting down, and having device against wall to increase sturdiness. Pt ambulated back to room while practicing taking rest breaks x3 along the way. At end of session, pt left seated in w/c, quick release belt on, chair alarm on, and all needs within reach.   Therapy Documentation Precautions:  Precautions Precautions: Fall Precaution Comments: L side hemiplegia and ataxia Restrictions Weight Bearing Restrictions: No  See Function Navigator for Current Functional Status.   Therapy/Group: Individual Therapy  LeCaffie Damme/22/2019, 4:29 PM

## 2018-04-19 ENCOUNTER — Inpatient Hospital Stay (HOSPITAL_COMMUNITY): Payer: Medicare Other | Admitting: Physical Therapy

## 2018-04-19 ENCOUNTER — Inpatient Hospital Stay (HOSPITAL_COMMUNITY): Payer: Medicare Other

## 2018-04-19 NOTE — Progress Notes (Signed)
Subjective/Complaints:  Pt didn't sleep all that well. Denies any specific issues  ROS: Patient denies fever, rash, sore throat, blurred vision, nausea, vomiting, diarrhea, cough, shortness of breath or chest pain, joint or back pain, headache, or mood change.   Objective: Vital Signs: Blood pressure 119/70, pulse (!) 53, temperature 98.7 F (37.1 C), temperature source Oral, resp. rate 18, height _0  (1.702 m), weight 76 kg (167 lb 8.8 oz), SpO2 97 %. No results found. Results for orders placed or performed during the hospital encounter of 04/06/18 (from the past 72 hour(s))  Basic metabolic panel     Status: Abnormal   Collection Time: 04/17/18  8:24 AM  Result Value Ref Range   Sodium 133 (L) 135 - 145 mmol/L   Potassium 4.4 3.5 - 5.1 mmol/L   Chloride 103 101 - 111 mmol/L   CO2 20 (L) 22 - 32 mmol/L   Glucose, Bld 120 (H) 65 - 99 mg/dL   BUN 32 (H) 6 - 20 mg/dL   Creatinine, Ser 1.50 (H) 0.61 - 1.24 mg/dL   Calcium 9.4 8.9 - 10.3 mg/dL   GFR calc non Af Amer 42 (L) >60 mL/min   GFR calc Af Amer 49 (L) >60 mL/min    Comment: (NOTE) The eGFR has been calculated using the CKD EPI equation. This calculation has not been validated in all clinical situations. eGFR's persistently <60 mL/min signify possible Chronic Kidney Disease.    Anion gap 10 5 - 15    Comment: Performed at [facility] [address] [place], [place] [address]  Basic metabolic panel     Status: Abnormal   Collection Time: 04/18/18  6:12 AM  Result Value Ref Range   Sodium 133 (L) 135 - 145 mmol/L    Comment: POST-ULTRACENTRIFUGATION   Potassium 4.1 3.5 - 5.1 mmol/L   Chloride 105 101 - 111 mmol/L   CO2 14 (L) 22 - 32 mmol/L   Glucose, Bld 100 (H) 65 - 99 mg/dL   BUN 36 (H) 6 - 20 mg/dL   Creatinine, Ser 1.37 (H) 0.61 - 1.24 mg/dL   Calcium 9.1 8.9 - 10.3 mg/dL   GFR calc non Af Amer 47 (L) >60 mL/min   GFR calc Af Amer 55 (L) >60 mL/min    Comment: (NOTE) The eGFR has been calculated  using the CKD EPI equation. This calculation has not been validated in all clinical situations. eGFR's persistently <60 mL/min signify possible Chronic Kidney Disease.    Anion gap 14 5 - 15    Comment: Performed at [facility] [address], [place], [place] [address]     Constitutional: No distress . Vital signs reviewed. HEENT: EOMI, oral membranes moist Cardiovascular: RRR without murmur. No JVD    Respiratory: CTA Bilaterally without wheezes or rales. Normal effort    GI: BS +, non-tender, non-distended  Neuro: sleepy. Motor 3+/5 Left delt, Biceps , triceps and grip--no changes 3+/5 HF, KE ADF (inconsistent) 4/5 RUE  Musc/Skel:  No edema or tenderness in extremities. Gen NAD, vital signs reviewed. Psychiatric: Withdrawn  Assessment/Plan: 1. Functional deficits secondary to RIght BG infarction which require 3+ hours per day of interdisciplinary therapy in a comprehensive inpatient rehab setting. Physiatrist is providing close team supervision and 24 hour management of active medical problems listed below. Physiatrist and rehab team continue to assess barriers to discharge/monitor patient progress toward functional and medical goals. FIM: Function - Bathing Bathing activity did not occur: Refused Position:  Wheelchair/chair at sink Body parts bathed by patient: Right arm, Left arm, Chest, Abdomen, Front perineal area, Right upper leg, Left upper leg, Right lower leg, Left lower leg, Buttocks Body parts bathed by helper: Back Assist Level: Supervision or verbal cues  Function- Upper Body Dressing/Undressing What is the patient wearing?: Pull over shirt/dress Pull over shirt/dress - Perfomed by patient: Thread/unthread right sleeve, Thread/unthread left sleeve, Pull shirt over trunk, Put head through opening Pull over shirt/dress - Perfomed by helper: Put head through opening Assist Level: Supervision or verbal cues Function - Lower Body Dressing/Undressing What is  the patient wearing?: Shoes, Pants Position: Wheelchair/chair at sink Pants- Performed by patient: Thread/unthread right pants leg, Thread/unthread left pants leg, Pull pants up/down Pants- Performed by helper: Thread/unthread right pants leg, Thread/unthread left pants leg, Pull pants up/down Non-skid slipper socks- Performed by patient: Don/doff right sock, Don/doff left sock Socks - Performed by patient: Don/doff right sock, Don/doff left sock Socks - Performed by helper: Don/doff right sock, Don/doff left sock Shoes - Performed by patient: Don/doff right shoe, Don/doff left shoe, Fasten right, Fasten left Shoes - Performed by helper: Don/doff right shoe, Don/doff left shoe, Fasten left, Fasten right Assist for footwear: Supervision/touching assist Assist for lower body dressing: Touching or steadying assistance (Pt > 75%)  Function - Toileting Toileting activity did not occur: N/A Toileting steps completed by patient: Adjust clothing prior to toileting, Performs perineal hygiene, Adjust clothing after toileting Toileting steps completed by helper: Performs perineal hygiene Toileting Assistive Devices: Grab bar or rail Assist level: Touching or steadying assistance (Pt.75%)  Function - Air cabin crew transfer activity did not occur: Safety/medical concerns Toilet transfer assistive device: Elevated toilet seat/BSC over toilet, Grab bar, Walker Assist level to toilet: Touching or steadying assistance (Pt > 75%) Assist level from toilet: Touching or steadying assistance (Pt > 75%)  Function - Chair/bed transfer Chair/bed transfer method: Ambulatory Chair/bed transfer assist level: Supervision or verbal cues Chair/bed transfer assistive device: Armrests, Walker Chair/bed transfer details: Verbal cues for sequencing, Verbal cues for precautions/safety  Function - Locomotion: Wheelchair Type: Manual Max wheelchair distance: 100 ft (BLE) Assist Level: Supervision or verbal  cues Wheel 50 feet with 2 turns activity did not occur: Safety/medical concerns(fatigue) Assist Level: Supervision or verbal cues Wheel 150 feet activity did not occur: Safety/medical concerns Function - Locomotion: Ambulation Assistive device: Other (comment)(rollator) Max distance: 150' Assist level: Supervision or verbal cues Assist level: Supervision or verbal cues Walk 50 feet with 2 turns activity did not occur: Safety/medical concerns Assist level: Supervision or verbal cues Walk 150 feet activity did not occur: Safety/medical concerns Assist level: Supervision or verbal cues Walk 10 feet on uneven surfaces activity did not occur: Safety/medical concerns  Function - Comprehension Comprehension: Auditory Comprehension assist level: Understands basic 75 - 89% of the time/ requires cueing 10 - 24% of the time  Function - Expression Expression: Verbal Expression assist level: Expresses basic 75 - 89% of the time/requires cueing 10 - 24% of the time. Needs helper to occlude trach/needs to repeat words.  Function - Social Interaction Social Interaction assist level: Interacts appropriately 75 - 89% of the time - Needs redirection for appropriate language or to initiate interaction.  Function - Problem Solving Problem solving assist level: Solves basic 50 - 74% of the time/requires cueing 25 - 49% of the time, Solves basic 75 - 89% of the time/requires cueing 10 - 24% of the time  Function - Memory Memory assist level: Recognizes or recalls 50 -  74% of the time/requires cueing 25 - 49% of the time Patient normally able to recall (first 3 days only): That he or she is in a hospital  Medical Problem List and Plan: 1.  Altered mental status with decreased functional mobility secondary to right basal ganglia infarction.  Plan aspirin and Plavix times 3 weeks then Plavix alone  Continue CIR PT, OT, SLP  2.  DVT Prophylaxis/Anticoagulation: Subcutaneous heparin. 3. Pain Management:  Tylenol as needed 4. Mood/mild dementia.  Tofranil 25 mg nightly stopped due to AMS  - trial melatonin for sleep--consider other options Will try to get baseline from family 5. Neuropsych: This patient is capable of making decisions on his own behalf.- per SLP def appear chronic    6. Skin/Wound Care: Routine skin checks 7. Fluids/Electrolytes/Nutrition: Routine I/O's, I 868m 4/22   Ate 100% most meals 4/21-4/22 8.  CKD stage III.     Stable per labs 4/22   Continue to monitor 9.  Hypertension.  HCTZ 25 mg daily.  Monitor with increased mobility    Vitals:   04/19/18 0207 04/19/18 0810  BP: 122/83 119/70  Pulse: (!) 57 (!) 53  Resp: 18   Temp: 98.7 F (37.1 C)   SpO2: 97% 97%  Controlled 4/23,    HCTZ held given limited PO intake 10.  History of BPH.  Currently on both Flomax and Ditropan as prior to admission.     Monitor PVRs per nsg 11.  UTI/leukocytosis.   urine culture shows Klebsiella, change to Keflex   Blood cultures x2 NG final  on 4/15   WBCs 12.0 on 4/11   Afebrile   Will recheck CBC 4/20 12.  Hyperlipidemia.  Pravachol 13.  Multiple colon resections due to Crohn's disease.  Continue to monitor 14.  Hypothyroidism.  TSH 4.601.Synthroid, prior dose 0.570m now on .255mill resume .5mg39mse, HR may slowly  increase 15.  Vitamin B12 deficiency.  Continue vitamin B12 16.  Knee and hip contracture, amb with walker at home 17.  Hypokalemia supplementing with KCl 20 mEq/day   Potassium 3.8 on 4/11   Continue to monitor 18. Hyponatremia  Sodium 134 on 4/11, recheck BMET 4/22 was stable at 133  Continue to monitor 19.  Asymptomatic brady previously documented sinus brady on EKG ( also R and L BBB)   LOS (Days) 13 A FACE TO FACE EVALUATION WAS PERFORMED  ZachMeredith Staggers3/2019, 1:50 PM

## 2018-04-19 NOTE — Progress Notes (Signed)
Social Work Patient ID: Anthony Hayes, male   DOB: Aug 21, 1935, 83 y.o.   MRN: [id number] Spoke with Dot-RN at [facility] regarding pt returning home. Have faxed information to them and offered for them to come and evaluate him prior to discharge. Daughter's want him to return there since it is his home and not have to go to a NH prior to going back there. Encouraged daughter's to talk with Dot-Rn at the facility.

## 2018-04-19 NOTE — Progress Notes (Signed)
Speech Language Pathology Daily Session Note  Patient Details  Name: Anthony Hayes MRN: [id number] Date of Birth: 05-05-1935  Today's Date: 04/19/2018 SLP Individual Time: 0900-0930 SLP Individual Time Calculation (min): 30 min  Short Term Goals: Week 2: SLP Short Term Goal 1 (Week 2): Pt will sustain hiis attention to functional tasks for 10 minute intervals with min verbal cues for redirection.  SLP Short Term Goal 2 (Week 2): Pt will complete basic familiar tasks with min assist verbal cues for functional problem solving.  SLP Short Term Goal 3 (Week 2): Pt will recall daily information with mod assist verbal cues for use of external aids.  SLP Short Term Goal 4 (Week 2): Pt will return demonstration of at least 2 safety precautions during functional tasks with min assist verbal cues.   Skilled Therapeutic Interventions:Skilled ST services focused on cognitive skills. SLP facilitated functional problem solving and recall of card game blink, pt was able to recall rules with max A verbal cues and min A verbal during game.Pt required min A verbal cues for basic problem solving. Pt demonstrated pleasant change in demeanor today compare to notes from previous session. Pt demonstrated sustained attention for 30 minutes with supervision A verbal cues. Pt seemed to enjoy cards and coffee. Pt was left in room with call bell within reach. Reccomend to continue skilled ST services.      Function:  Eating Eating Eating activity did not occur: N/A               Cognition Comprehension Comprehension assist level: Understands basic 75 - 89% of the time/ requires cueing 10 - 24% of the time  Expression   Expression assist level: Expresses basic 90% of the time/requires cueing < 10% of the time.  Social Interaction Social Interaction assist level: Interacts appropriately 90% of the time - Needs monitoring or encouragement for participation or interaction.  Problem Solving Problem solving assist  level: Solves basic 75 - 89% of the time/requires cueing 10 - 24% of the time  Memory Memory assist level: Recognizes or recalls 25 - 49% of the time/requires cueing 50 - 75% of the time;Recognizes or recalls 90% of the time/requires cueing < 10% of the time    Pain Pain Assessment Pain Scale: 0-10 Pain Score: 0-No pain  Therapy/Group: Individual Therapy  Nasier Thumm  [facility] 04/19/2018, 3:09 PM

## 2018-04-19 NOTE — Progress Notes (Signed)
Occupational Therapy Session Note  Patient Details  Name: Anthony Hayes MRN: [id number] Date of Birth: [birth year]  Today's Date: 04/19/2018 OT Individual Time: 1045-1200 OT Individual Time Calculation (min): 75 min    Short Term Goals: Week 2:  OT Short Term Goal 1 (Week 2): STG=LTG due to LOS   Skilled Therapeutic Interventions/Progress Updates:    Pt received sitting up in w/c with no c/o pain and agreeable to therapy. Session focused on static/dynamic standing balance during ADL/IADLs and functional reaching from standing. Tactile cues for self-correction of posture provided during standing level peri-care at sink. Vc required for use of seated rest break following bathing with pt attempting to complete LB dressing in standing. Education provided re fall risk reduction and cueing throughout session to increase safety awareness. Pt attempted to complete laundry task, collecting clothes and performing 76f of w/c propulsion, however machine was in use and pt returned to room. Pt completed shaving task at sink. First, attempting to use disposable razors, however pt becoming frustrated with razor "pulling at the hairs" and slammed razor into sink, requiring emotional support and vc for deescalation, returning to baseline quickly. Pt then brought down to therapy gym and participated in 10 min of standing level Wii game, removing L UE from RW to perform arm swing with (S) provided throughout. Pt returned to room and left in w/c with QRB donned and chair alarm activated.   Therapy Documentation Precautions:  Precautions Precautions: Fall Precaution Comments: L side hemiplegia and ataxia Restrictions Weight Bearing Restrictions: No  Pain: Pain Assessment Pain Scale: 0-10 Pain Score: 0-No pain  See Function Navigator for Current Functional Status.   Therapy/Group: Individual Therapy  SCurtis Sites4/23/2019, 12:14 PM

## 2018-04-19 NOTE — NC FL2 (Signed)
[place] LEVEL OF CARE SCREENING TOOL     IDENTIFICATION  Patient Name: Anthony Hayes Birthdate: 09-16-1935 Sex: male Admission Date (Current Location): 04/06/2018  [facility] and [place] Number:  Herbalist and Address:  [facility], [place] [address], [place], [place] [address]      Provider Number: [id number]  Attending Physician Name and Address:  Charlett Blake, MD  Relative Name and Phone Number:  [name] Philpott-daughter [phone]    Current Level of Care: Other (Comment)(Rehab) Recommended Level of Care: Assisted Living Facility Prior Approval Number:    Date Approved/Denied:   PASRR Number:    Discharge Plan: Other (Comment)(ALF)    Current Diagnoses: Patient Active Problem List   Diagnosis Date Noted  . Sleep disturbance   . Stage 3 chronic kidney disease ([place])   . Benign essential HTN   . Hypokalemia   . Hyponatremia   . Infarction of right basal ganglia ([place]) 04/06/2018  . Pressure injury of skin 04/06/2018  . Vitamin B12 deficiency   . Hypothyroidism   . Crohn's disease with complication ([place])   . Benign prostatic hyperplasia   . Acute lower UTI   . Dementia without behavioral disturbance   . Acute ischemic stroke ([place]) 04/05/2018  . Leukocytosis   . Shortness of breath   . Acute encephalopathy   . CVA (cerebral vascular accident) ([place]) 04/03/2018  . Calculus, kidney 01/21/2018  . Acute blood loss anemia   . Mallory-Weiss syndrome   . Esophageal stricture   . Respiratory failure ([place])   . UGIB (upper gastrointestinal bleed) 10/16/2017  . GERD (gastroesophageal reflux disease) 10/16/2017  . Essential hypertension, benign 10/16/2017  . Chest pain 10/15/2017  . Hypothyroidism (acquired) 06/08/2017  . Chronic kidney disease, stage III (moderate) ([place]) 03/19/2016  . Dyslipidemia 03/19/2016  . Degeneration of lumbar or lumbosacral intervertebral disc 07/10/2008  . Arthropathy, lower leg 05/23/2004     Orientation RESPIRATION BLADDER Height & Weight     Self, Situation, Place  Normal Incontinent Weight: 167 lb 8.8 oz (76 kg) Height:  5' 7"  (170.2 cm)  BEHAVIORAL SYMPTOMS/MOOD NEUROLOGICAL BOWEL NUTRITION STATUS      Continent Diet(Low sodium diet)  AMBULATORY STATUS COMMUNICATION OF NEEDS Skin   Supervision Verbally Normal                       Personal Care Assistance Level of Assistance  Bathing, Dressing Bathing Assistance: Limited assistance   Dressing Assistance: Limited assistance     Functional Limitations Info             SPECIAL CARE FACTORS FREQUENCY  PT (By licensed PT), OT (By licensed OT)     PT Frequency: 3x week OT Frequency: 3x week            Contractures Contractures Info: Present    Additional Factors Info  Code Status, Allergies Code Status Info: Full Code Allergies Info: Lisinopril           Current Medications (04/19/2018):  This is the current hospital active medication list Current Facility-Administered Medications  Medication Dose Route Frequency Provider Last Rate Last Dose  . acetaminophen (TYLENOL) tablet 650 mg  650 mg Oral Q4H PRN Cathlyn Parsons, PA-C   650 mg at 04/11/18 0076   Or  . acetaminophen (TYLENOL) solution 650 mg  650 mg Per Tube Q4H PRN Angiulli, Lavon Paganini, PA-C       Or  . acetaminophen (  TYLENOL) suppository 650 mg  650 mg Rectal Q4H PRN Angiulli, Lavon Paganini, PA-C      . aspirin EC tablet 81 mg  81 mg Oral Daily Cathlyn Parsons, PA-C   81 mg at 04/19/18 0751  . cephALEXin (KEFLEX) capsule 250 mg  250 mg Oral Q8H Kirsteins, Luanna Salk, MD   250 mg at 04/19/18 1607  . clopidogrel (PLAVIX) tablet 75 mg  75 mg Oral Daily Charlett Blake, MD   75 mg at 04/19/18 0751  . guaiFENesin-dextromethorphan (ROBITUSSIN DM) 100-10 MG/5ML syrup 10 mL  10 mL Oral QID PRN Cathlyn Parsons, PA-C      . heparin injection 5,000 Units  5,000 Units Subcutaneous Q8H Cathlyn Parsons, PA-C   5,000 Units at 04/19/18 3710   . ipratropium (ATROVENT) 0.06 % nasal spray 2 spray  2 spray Each Nare QID Cathlyn Parsons, PA-C   2 spray at 04/19/18 1203  . levothyroxine (SYNTHROID, LEVOTHROID) tablet 50 mcg  50 mcg Oral QAC breakfast Charlett Blake, MD   50 mcg at 04/19/18 [phone]  . loratadine (CLARITIN) tablet 10 mg  10 mg Oral Daily Cathlyn Parsons, PA-C   10 mg at 04/19/18 0751  . Melatonin TABS 3 mg  3 mg Oral QHS Kirsteins, Luanna Salk, MD   3 mg at 04/18/18 2122  . ondansetron (ZOFRAN) tablet 4 mg  4 mg Oral Q6H PRN Angiulli, Lavon Paganini, PA-C       Or  . ondansetron [facility]) injection 4 mg  4 mg Intravenous Q6H PRN Angiulli, Lavon Paganini, PA-C      . oxybutynin (DITROPAN) tablet 5 mg  5 mg Oral Daily AngiulliLavon Paganini, PA-C   5 mg at 04/19/18 0751  . pantoprazole (PROTONIX) EC tablet 40 mg  40 mg Oral BID Cathlyn Parsons, PA-C   40 mg at 04/19/18 0751  . saccharomyces boulardii (FLORASTOR) capsule 250 mg  250 mg Oral BID Charlett Blake, MD   250 mg at 04/19/18 0751  . senna-docusate (Senokot-S) tablet 1 tablet  1 tablet Oral QHS PRN Angiulli, Lavon Paganini, PA-C      . sorbitol 70 % solution 30 mL  30 mL Oral Daily PRN Cathlyn Parsons, PA-C   30 mL at 04/09/18 0626  . tamsulosin (FLOMAX) capsule 0.4 mg  0.4 mg Oral Daily Cathlyn Parsons, PA-C   0.4 mg at 04/19/18 0751  . vitamin B-12 (CYANOCOBALAMIN) tablet 500 mcg  500 mcg Oral Daily Cathlyn Parsons, PA-C   500 mcg at 04/19/18 4854     Discharge Medications: Please see discharge summary for a list of discharge medications.  Relevant Imaging Results:  Relevant Lab Results:   Additional Information SSN: [id number]  Brenson Hartman, Gardiner Rhyme, LCSW

## 2018-04-19 NOTE — Progress Notes (Signed)
Physical Therapy Session Note  Patient Details  Name: Anthony Hayes MRN: [id number] Date of Birth: Jun 16, 1935  Today's Date: 04/19/2018 PT Individual Time: 1300-1401 PT Individual Time Calculation (min): 61 min   Short Term Goals: Week 1:  PT Short Term Goal 1 (Week 1): Pt will perform bed mobility with S PT Short Term Goal 1 - Progress (Week 1): Not met PT Short Term Goal 2 (Week 1): Pt will perform stand pivot transfer minA PT Short Term Goal 2 - Progress (Week 1): Met PT Short Term Goal 3 (Week 1): Pt will ambulate x50' modA PT Short Term Goal 3 - Progress (Week 1): Met PT Short Term Goal 4 (Week 1): Pt will perform w/c propulsion x50' with minA PT Short Term Goal 4 - Progress (Week 1): Not met Week 2:  PT Short Term Goal 1 (Week 2): STG=LTG due to ELOS  Skilled Therapeutic Interventions/Progress Updates:    Pt received in w/c, agreeable to therapy. Pt denies pain. Pt donned shoes, socks, and L Foot Up brace with assistance 2/2 time management. Pt ambulated w/ rollator and supervision to gym, requiring verbal cuing to increase bilateral step width, improve heel strike and maintain forward gaze. Pt performed toe taps on 4'' step to facilitate L NMR and improve weight shifting. Pt required mod assist for balance and had 1 LOB, requiring max assist to correct. Pt educated to regain balance after each trial and to use slow and coordinated movements. Pt demonstrated decreased dorsiflexion on L and decreased weight shifting to L. Pt engaged in dynamic standing balance activity of horseshoe toss while standing on wedge for prolonged stretch of bilateral heel cords and HS. Pt required min assist for balance and was required to use LUE to improve functional use. Pt performed marching on foam at parallel bars for balance challenge and to improve weight shifting. Pt demonstrated decreased bilateral hip flexion and dorsiflexion and required verbal cuing to clear feet from mat. Throughout session, pt  lethargic and demonstrated decreased command following and decreased attention to task as he was easily distracted by his surroundings. Pt ambulated 144f to day room with min assist as he fatigued, requiring one rest break. Pt educated to lock rollator breaks every time he sits down or stands up. Pt demonstrated one LOB 2/2 decreased function of rollator break and required mod assist to correct. Pt performed Kinetron seated at 50cm/s to improve weight shifting and L NMR. At end of session, pt transported back to room in w/c total assist 2/2 fatigue. Pt left seated in w/c, quick release belt on, chair alarm on, and all needs within reach.   Therapy Documentation Precautions:  Precautions Precautions: Fall Precaution Comments: L side hemiplegia and ataxia Restrictions Weight Bearing Restrictions: No  See Function Navigator for Current Functional Status.   Therapy/Group: Individual Therapy  LCaffie Damme4/23/2019, 3:01 PM

## 2018-04-19 NOTE — Progress Notes (Signed)
Orthopedic Tech Progress Note Patient Details:  [name] April 18, 1935 [id number]  Patient ID: Anthony Hayes, male   DOB: 25-Dec-1935, 83 y.o.   MRN: [id number]   Hildred Priest 04/19/2018, 2:35 PM Called in hanger brace order; spoke with [facility]

## 2018-04-19 NOTE — Progress Notes (Signed)
Physical Therapy Session Note  Patient Details  Name: Anthony Hayes MRN: [id number] Date of Birth: 01/27/1935  Today's Date: 04/19/2018 PT Individual Time: 1000-1025 PT Individual Time Calculation (min): 25 min   Short Term Goals: Week 2:  PT Short Term Goal 1 (Week 2): STG=LTG due to ELOS  Skilled Therapeutic Interventions/Progress Updates:   Pt in supine and agreeable to therapy, no c/o pain. Session focused on functional independence for self-care and gait tasks. Transferred to EOB w/ supervision and donned LE garments w/ set-up assist only and support on rollator. Pt ambulated to/from toilet w/ supervision to have continent void. Stood at sink to wash hands, close supervision for dynamic standing balance w/o UE support during self-care tasks. Ambulated around unit in 100-150' bouts x3 w/ supervision. Emphasis on rollator management and safety w/ turning to sit or standing. Verbal cues to lock brakes 50% of time. Returned to room and ended session in w/c, call bell within reach and all needs met.   Therapy Documentation Precautions:  Precautions Precautions: Fall Precaution Comments: L side hemiplegia and ataxia Restrictions Weight Bearing Restrictions: No Vital Signs: Therapy Vitals Temp Source: Oral Pulse Rate: (!) 53 BP: 119/70 Patient Position (if appropriate): Sitting Oxygen Therapy SpO2: 97 % O2 Device: Room Air Pain: Pain Assessment Pain Scale: 0-10 Pain Score: 0-No pain  See Function Navigator for Current Functional Status.   Therapy/Group: Individual Therapy   K Arnette 04/19/2018, 10:25 AM  

## 2018-04-20 ENCOUNTER — Inpatient Hospital Stay (HOSPITAL_COMMUNITY): Payer: Medicare Other | Admitting: Occupational Therapy

## 2018-04-20 ENCOUNTER — Inpatient Hospital Stay (HOSPITAL_COMMUNITY): Payer: Medicare Other | Admitting: Physical Therapy

## 2018-04-20 ENCOUNTER — Ambulatory Visit (HOSPITAL_COMMUNITY): Payer: Medicare Other

## 2018-04-20 MED ORDER — CYANOCOBALAMIN 500 MCG PO TABS: 500.0000 ug | ORAL_TABLET | Freq: Every day | ORAL | Status: DC

## 2018-04-20 NOTE — Progress Notes (Signed)
Occupational Therapy Session Note  Patient Details  Name: Anthony Hayes MRN: [id number] Date of Birth: 05-25-1935  Today's Date: 04/20/2018 OT Individual Time: 1300-1400 OT Individual Time Calculation (min): 60 min    Short Term Goals: Week 2:  OT Short Term Goal 1 (Week 2): STG=LTG due to LOS  Skilled Therapeutic Interventions/Progress Updates:    Pt seen for OT session focusing on functional ambulation, transfers, standing balance/tolerance and problem solcing. Pt sitting up in w/c upon arrival, alert and agreeable to tx session. He self propelled w/c using B LEs to therapy gym with supervision. Pt ambulated throughout session with rollator and CGA- close supervision. He required cuing throughout for locking rollator brakes during sit<> stand.  In ADL apartment, pt completed functional transfers sit>stand from low soft surface couch with CGA and bed mobility on standard bed at supervision level. Attempted to discuss pt's PLOF and daily routine at ALF, however, pt unable to recall or make sense of PLOF. In therapy gym, completed functional ambulation weaving through cones with CGA and min cuing for rollator management. Pt then required to pick cones up from floor, cuing to lock brakes and min A for balance to reach towards floor.  Following seated rest break, he ascended and descended 6" steps with min A and VCs for leading foot strategies. Pt then completed puzzle from standing position, standing on foam wedge for heel cord stretch. Pt tolerated ~10 minutes in standing in order to complete puzzle with mod cuing for problem solving. Pt returned to room, completed toilet transfer and toileting task with CGA and VCs for sequencing/technique. Pt left sitting up in w/c at end of session, QRB and chair alarm on. All needs in reach.   Therapy Documentation Precautions:  Precautions Precautions: Fall Precaution Comments: L side hemiplegia and ataxia Restrictions Weight Bearing Restrictions:  No Pain:   No/denies pain  See Function Navigator for Current Functional Status.   Therapy/Group: Individual Therapy  Ramie Palladino L 04/20/2018, 7:17 AM

## 2018-04-20 NOTE — Progress Notes (Signed)
Speech Language Pathology Daily Session Note  Patient Details  Name: Anthony Hayes MRN: [id number] Date of Birth: Feb 26, 1935  Today's Date: 04/20/2018 SLP Individual Time: 1100-1200 SLP Individual Time Calculation (min): 60 min  Short Term Goals: Week 2: SLP Short Term Goal 1 (Week 2): Pt will sustain hiis attention to functional tasks for 10 minute intervals with min verbal cues for redirection.  SLP Short Term Goal 2 (Week 2): Pt will complete basic familiar tasks with min assist verbal cues for functional problem solving.  SLP Short Term Goal 3 (Week 2): Pt will recall daily information with mod assist verbal cues for use of external aids.  SLP Short Term Goal 4 (Week 2): Pt will return demonstration of at least 2 safety precautions during functional tasks with min assist verbal cues.   Skilled Therapeutic Interventions: Skilled ST services focused on cognitive skills. SLP facilitated basic problem solving with 4 sequence cards requiring min A verbal cues and 5 step sequence cards requiring mod A verbal cues. SLP facilitated recall of novel information utilize visual aid in card game, call it. Pt demonstrated ability to record symbol and word associated with symbol to assist in recall. Pt required min A verbal cues to utilize aid for recall. SLP educated pt on memory strategies utilizing external aids, pt stated understanding. SLP facilitated function problem solving with lunch time tray, pt required supervision- min A verbal cues. Pt required overall min a verbal cues for sustained attention in 15 minute intervals. Pt was left in room with call bell within reach and safety belt on. Recommend to continue skilled ST services.     Function:  Eating Eating                 Cognition Comprehension Comprehension assist level: Understands basic 75 - 89% of the time/ requires cueing 10 - 24% of the time  Expression   Expression assist level: Expresses basic 90% of the time/requires cueing <  10% of the time.  Social Interaction Social Interaction assist level: Interacts appropriately 90% of the time - Needs monitoring or encouragement for participation or interaction.  Problem Solving Problem solving assist level: Solves basic 75 - 89% of the time/requires cueing 10 - 24% of the time  Memory Memory assist level: Recognizes or recalls 75 - 89% of the time/requires cueing 10 - 24% of the time;Recognizes or recalls 50 - 74% of the time/requires cueing 25 - 49% of the time    Pain Pain Assessment Pain Score: 0-No pain  Therapy/Group: Individual Therapy  Clyde Upshaw  [facility] 04/20/2018, 1:56 PM

## 2018-04-20 NOTE — Progress Notes (Signed)
Social Work   Isaic Syler, Eliezer Champagne  Social Worker  Physical Medicine and Rehabilitation  Patient Care Conference  Signed  Date of Service:  04/20/2018  2:02 PM          Signed          Show:Clear all [x] Manual[x] Template[] Copied  Added by: [x] Kymorah Korf, Gardiner Rhyme, LCSW   [] Hover for details   Inpatient RehabilitationTeam Conference and Plan of Care Update Date: 04/20/2018   Time: 10:30 AM      Patient Name: Anthony Hayes      Medical Record Number: [id number]  Date of Birth: [birth year] Sex: Male         Room/Bed: 4W19C/4W19C-01 Payor Info: Payor: MEDICARE / Plan: MEDICARE PART A AND B / Product Type: *No Product type* /     Admitting Diagnosis: CVA  Admit Date/Time:  04/06/2018  4:07 PM Admission Comments: No comment available    Primary Diagnosis:  <principal problem not specified> Principal Problem: <principal problem not specified>       Patient Active Problem List    Diagnosis Date Noted  . Sleep disturbance    . Stage 3 chronic kidney disease ([place])    . Benign essential HTN    . Hypokalemia    . Hyponatremia    . Infarction of right basal ganglia ([place]) 04/06/2018  . Pressure injury of skin 04/06/2018  . Vitamin B12 deficiency    . Hypothyroidism    . Crohn's disease with complication ([place])    . Benign prostatic hyperplasia    . Acute lower UTI    . Dementia without behavioral disturbance    . Acute ischemic stroke ([place]) 04/05/2018  . Leukocytosis    . Shortness of breath    . Acute encephalopathy    . CVA (cerebral vascular accident) ([place]) 04/03/2018  . Calculus, kidney 01/21/2018  . Acute blood loss anemia    . Mallory-Weiss syndrome    . Esophageal stricture    . Respiratory failure ([place])    . UGIB (upper gastrointestinal bleed) 10/16/2017  . GERD (gastroesophageal reflux disease) 10/16/2017  . Essential hypertension, benign 10/16/2017  . Chest pain 10/15/2017  . Hypothyroidism (acquired) 06/08/2017  . Chronic kidney disease, stage III  (moderate) ([place]) 03/19/2016  . Dyslipidemia 03/19/2016  . Degeneration of lumbar or lumbosacral intervertebral disc 07/10/2008  . Arthropathy, lower leg 05/23/2004      Expected Discharge Date: Expected Discharge Date: 04/22/18   Team Members Present: Physician leading conference: Dr. Delice Lesch Social Worker Present: Ovidio Kin, LCSW Nurse Present: Isla Pence, RN PT Present: Lavone Nian, PT OT Present: Napoleon Form, OT SLP Present: Stormy Fabian, SLP PPS Coordinator present : Daiva Nakayama, RN, CRRN       Current Status/Progress Goal Weekly Team Focus  Medical     Patient with slow cognitive progress.  UTI has been treated.  He  is eating better.  Electrolytes are improving  Stabilized medically for next venue of care  BP management, sleep/wake   Bowel/Bladder     incont of bladder at times; lbm 4/21  cont of B/B with min assist  timed toileting q2-3hr while awake   Swallow/Nutrition/ Hydration               ADL's     Close supervision-min A overall with VCs for safety awareness and task initiation  Supervision overall   ADL re-training, functional transfers, functional activity tolerance, d/c planning   Mobility     supervision overall with rollator, can  require more assistance depending on alertness/fatigue level, impaired cognition but per daughter pt is at baseline  supervision<>min assist overall with LRAD  balance, NMR, transfers, gait, endurance, strengthening, safety awareness, pt education   Communication               Safety/Cognition/ Behavioral Observations   Min-Mod A  Min A   compensatory memory strategies, basic problem solving and sustained attention   Pain     no c/o pain  <2 out of 10  assess q shift and prn   Skin     ecchymosis to abd and bilat upper extremities  free from infection/breakdown during rehab stay  assess q shift and prn     *See Care Plan and progress notes for long and short-term goals.      Barriers to Discharge   Current  Status/Progress Possible Resolutions Date Resolved   Physician     Medical stability;Behavior        Supervision      Nursing                 PT  Behavior;Incontinence  pre-morbid dementia, baseline incontinence              OT                 SLP            SW              Discharge Planning/Teaching Needs:  Daughter's want pt to return to [facility] and working with facility on this, may need to have more assistance than did prior to admission here      Team Discussion:  Still requiring supervision-min with ambulation and cueing for tasks to complete. Flucuates levels depending upon fatigue and alertness. Needs brace for his left leg for stability. Monitor PVR's for retention. Would need someone with him in his ALF room for safety or pursue NH for a short time  Revisions to Treatment Plan:  NH versus ALF with hired caregivers    Continued Need for Acute Rehabilitation Level of Care: The patient requires daily medical management by a physician with specialized training in physical medicine and rehabilitation for the following conditions: Daily direction of a multidisciplinary physical rehabilitation program to ensure safe treatment while eliciting the highest outcome that is of practical value to the patient.: Yes Daily medical management of patient stability for increased activity during participation in an intensive rehabilitation regime.: Yes Daily analysis of laboratory values and/or radiology reports with any subsequent need for medication adjustment of medical intervention for : Neurological problems   Dorea Duff, Gardiner Rhyme 04/20/2018, 2:02 PM                  Adley Castello, Gardiner Rhyme, LCSW  Social Worker  Physical Medicine and Rehabilitation  Patient Care Conference  Signed  Date of Service:  04/13/2018  1:13 PM          Signed          [] Hide copied text  [] Hover for details   Inpatient RehabilitationTeam Conference and Plan of Care Update Date: 04/13/2018    Time: 10:55 AM      Patient Name: Anthony Hayes      Medical Record Number: [id number]  Date of Birth: 1935/09/11 Sex: Male         Room/Bed: 4W19C/4W19C-01 Payor Info: Payor: MEDICARE / Plan: MEDICARE PART A AND B / Product Type: *No Product type* /  Admitting Diagnosis: CVA  Admit Date/Time:  04/06/2018  4:07 PM Admission Comments: No comment available    Primary Diagnosis:  <principal problem not specified> Principal Problem: <principal problem not specified>       Patient Active Problem List    Diagnosis Date Noted  . Sleep disturbance    . Stage 3 chronic kidney disease ([place])    . Benign essential HTN    . Hypokalemia    . Hyponatremia    . Infarction of right basal ganglia ([place]) 04/06/2018  . Pressure injury of skin 04/06/2018  . Vitamin B12 deficiency    . Hypothyroidism    . Crohn's disease with complication ([place])    . Benign prostatic hyperplasia    . Acute lower UTI    . Dementia without behavioral disturbance    . Acute ischemic stroke ([place]) 04/05/2018  . Leukocytosis    . Shortness of breath    . Acute encephalopathy    . CVA (cerebral vascular accident) ([place]) 04/03/2018  . Calculus, kidney 01/21/2018  . Acute blood loss anemia    . Mallory-Weiss syndrome    . Esophageal stricture    . Respiratory failure ([place])    . UGIB (upper gastrointestinal bleed) 10/16/2017  . GERD (gastroesophageal reflux disease) 10/16/2017  . Essential hypertension, benign 10/16/2017  . Chest pain 10/15/2017  . Hypothyroidism (acquired) 06/08/2017  . Chronic kidney disease, stage III (moderate) ([place]) 03/19/2016  . Dyslipidemia 03/19/2016  . Degeneration of lumbar or lumbosacral intervertebral disc 07/10/2008  . Arthropathy, lower leg 05/23/2004      Expected Discharge Date: Expected Discharge Date: 04/22/18   Team Members Present: Physician leading conference: Dr. Alysia Penna Social Worker Present: Ovidio Kin, LCSW Nurse Present: Dorthula Nettles, RN PT Present:  Lavone Nian, PT OT Present: Napoleon Form, OT SLP Present: Stormy Fabian, SLP PPS Coordinator present : Daiva Nakayama, RN, CRRN       Current Status/Progress Goal Weekly Team Focus  Medical     hx of dementia, incont of bowel adn bladder  upgrade function to allow return to assisted living with additional support  improve bowel continence   Bowel/Bladder     Incontinent B/B  Continue to toilet q 2 hr and PRN  No s/s of constipation/UTI   Swallow/Nutrition/ Hydration               ADL's     Min A functional transfers; Steadying assist LB dressing, bathing, and toileting, Supervision/ VCs UB bathing, UB dressing, and grooming  Supervision overall   Cognitive re-training and orientation, functional standing balance/endurance, safety awareness, ADL re-training   Mobility     min<>max assist depending on alertness, short distance gait with RW & mod assist, impaired cognition  min assist<>mod I, ambulation goals may be upgraded to supervision with LRAD  balance, NMR, transfers, gait, endurance, strengthening, cognitive remediation   Communication               Safety/Cognition/ Behavioral Observations   Mod A for basic   Min A   compensatory memory strategies, basic problem solving, sustained attention   Pain     No c/o pain  Less than 2  Access q 4hr and PRN   Skin     CDI  Remains CDI  Access skin q shift and PRN.     *See Care Plan and progress notes for long and short-term goals.      Barriers to Discharge   Current Status/Progress Possible Resolutions Date Resolved  Physician     Medical stability;Incontinence;Behavior;Other (comments)  underlying dementia  slow progress toward goals  cont rehab      Nursing                 PT  Incontinence  pre-morbid dementia              OT                 SLP Behavior pt is frequently irritable           SW              Discharge Planning/Teaching Needs:  Hopefully to go back to [facility] will need to be ambulatory and can  provide supervision level. Daughter's involved.      Team Discussion:  Goals supervision-min assist level. Needs constant cueing. Problems with attention, concentration and memory which could be from dementia diagnosis and being in a unfamiliar environment. Inconsistent depending time of day and how he is feeling. Incontinent of both bowel and bladder. Nursing on timed toileting. Need to see if can go back to ALF and they can provide the care he requires.  Revisions to Treatment Plan:  DC 4/26    Continued Need for Acute Rehabilitation Level of Care: The patient requires daily medical management by a physician with specialized training in physical medicine and rehabilitation for the following conditions: Daily direction of a multidisciplinary physical rehabilitation program to ensure safe treatment while eliciting the highest outcome that is of practical value to the patient.: Yes Daily medical management of patient stability for increased activity during participation in an intensive rehabilitation regime.: Yes Daily analysis of laboratory values and/or radiology reports with any subsequent need for medication adjustment of medical intervention for : Neurological problems;Mood/behavior problems   [name] 04/14/2018, 9:06 AM                 Patient ID: [name], male   DOB: November 05, 1935, 83 y.o.   MRN: [id number]

## 2018-04-20 NOTE — NC FL2 (Addendum)
[place] LEVEL OF CARE SCREENING TOOL     IDENTIFICATION  Patient Name: Anthony Hayes Birthdate: 06/25/1935 Sex: male Admission Date (Current Location): 04/06/2018  [facility] and [place] Number:  Herbalist and Address:  [facility], [place] [address], [place], [place] [address]      Provider Number: [id number]  Attending Physician Name and Address:  Charlett Blake, MD  Relative Name and Phone Number:  [name] Philpott-daughter [phone]    Current Level of Care: Other (Comment)(rehab) Recommended Level of Care: [place] Prior Approval Number:    Date Approved/Denied:   PASRR Number: [id number]  Discharge Plan: SNF    Current Diagnoses: Patient Active Problem List   Diagnosis Date Noted  . Sleep disturbance   . Stage 3 chronic kidney disease ([place])   . Benign essential HTN   . Hypokalemia   . Hyponatremia   . Infarction of right basal ganglia ([place]) 04/06/2018  . Pressure injury of skin 04/06/2018  . Vitamin B12 deficiency   . Hypothyroidism   . Crohn's disease with complication ([place])   . Benign prostatic hyperplasia   . Acute lower UTI   . Dementia without behavioral disturbance   . Acute ischemic stroke ([place]) 04/05/2018  . Leukocytosis   . Shortness of breath   . Acute encephalopathy   . CVA (cerebral vascular accident) ([place]) 04/03/2018  . Calculus, kidney 01/21/2018  . Acute blood loss anemia   . Mallory-Weiss syndrome   . Esophageal stricture   . Respiratory failure ([place])   . UGIB (upper gastrointestinal bleed) 10/16/2017  . GERD (gastroesophageal reflux disease) 10/16/2017  . Essential hypertension, benign 10/16/2017  . Chest pain 10/15/2017  . Hypothyroidism (acquired) 06/08/2017  . Chronic kidney disease, stage III (moderate) ([place]) 03/19/2016  . Dyslipidemia 03/19/2016  . Degeneration of lumbar or lumbosacral intervertebral disc 07/10/2008  . Arthropathy, lower leg 05/23/2004     Orientation RESPIRATION BLADDER Height & Weight     Self, Situation, Place  Normal Incontinent Weight: 167 lb 12.3 oz (76.1 kg) Height:  5' 7"  (170.2 cm)  BEHAVIORAL SYMPTOMS/MOOD NEUROLOGICAL BOWEL NUTRITION STATUS      Continent Diet(Low sodium-heart healthy diet)  AMBULATORY STATUS COMMUNICATION OF NEEDS Skin   Supervision Verbally Normal                       Personal Care Assistance Level of Assistance  Bathing, Dressing Bathing Assistance: Limited assistance   Dressing Assistance: Limited assistance     Functional Limitations Info  Speech     Speech Info: Impaired    SPECIAL CARE FACTORS FREQUENCY  PT (By licensed PT), OT (By licensed OT)  Speech ( by licensed SP)     PT Frequency: 5x week OT Frequency: 5x week  SP Frequency 5x week          Contractures Contractures Info: Not present    Additional Factors Info  Code Status, Allergies Code Status Info: Full Code Allergies Info: Lisinopril           Current Medications (04/20/2018):  This is the current hospital active medication list Current Facility-Administered Medications  Medication Dose Route Frequency Provider Last Rate Last Dose  . acetaminophen (TYLENOL) tablet 650 mg  650 mg Oral Q4H PRN Cathlyn Parsons, PA-C   650 mg at 04/11/18 2197   Or  . acetaminophen (TYLENOL) solution 650 mg  650 mg Per Tube Q4H PRN Cathlyn Parsons, PA-C  Or  . acetaminophen (TYLENOL) suppository 650 mg  650 mg Rectal Q4H PRN Angiulli, Lavon Paganini, PA-C      . aspirin EC tablet 81 mg  81 mg Oral Daily Cathlyn Parsons, PA-C   81 mg at 04/20/18 9381  . cephALEXin (KEFLEX) capsule 250 mg  250 mg Oral Q8H Kirsteins, Luanna Salk, MD   250 mg at 04/20/18 [phone]  . clopidogrel (PLAVIX) tablet 75 mg  75 mg Oral Daily Charlett Blake, MD   75 mg at 04/20/18 3716  . guaiFENesin-dextromethorphan (ROBITUSSIN DM) 100-10 MG/5ML syrup 10 mL  10 mL Oral QID PRN Cathlyn Parsons, PA-C      . heparin injection 5,000  Units  5,000 Units Subcutaneous Q8H Cathlyn Parsons, PA-C   5,000 Units at 04/20/18 [phone]  . ipratropium (ATROVENT) 0.06 % nasal spray 2 spray  2 spray Each Nare QID Cathlyn Parsons, PA-C   2 spray at 04/20/18 0900  . levothyroxine (SYNTHROID, LEVOTHROID) tablet 50 mcg  50 mcg Oral QAC breakfast Charlett Blake, MD   50 mcg at 04/20/18 [phone]  . loratadine (CLARITIN) tablet 10 mg  10 mg Oral Daily Cathlyn Parsons, PA-C   10 mg at 04/20/18 0175  . Melatonin TABS 3 mg  3 mg Oral QHS Kirsteins, Luanna Salk, MD   3 mg at 04/19/18 2138  . ondansetron (ZOFRAN) tablet 4 mg  4 mg Oral Q6H PRN Angiulli, Lavon Paganini, PA-C       Or  . ondansetron [facility]) injection 4 mg  4 mg Intravenous Q6H PRN Angiulli, Lavon Paganini, PA-C      . oxybutynin (DITROPAN) tablet 5 mg  5 mg Oral Daily AngiulliLavon Paganini, PA-C   5 mg at 04/20/18 1025  . pantoprazole (PROTONIX) EC tablet 40 mg  40 mg Oral BID Cathlyn Parsons, PA-C   40 mg at 04/20/18 8527  . saccharomyces boulardii (FLORASTOR) capsule 250 mg  250 mg Oral BID Charlett Blake, MD   250 mg at 04/20/18 7824  . senna-docusate (Senokot-S) tablet 1 tablet  1 tablet Oral QHS PRN Angiulli, Lavon Paganini, PA-C      . sorbitol 70 % solution 30 mL  30 mL Oral Daily PRN Cathlyn Parsons, PA-C   30 mL at 04/09/18 0626  . tamsulosin (FLOMAX) capsule 0.4 mg  0.4 mg Oral Daily Cathlyn Parsons, PA-C   0.4 mg at 04/20/18 2353  . vitamin B-12 (CYANOCOBALAMIN) tablet 500 mcg  500 mcg Oral Daily Cathlyn Parsons, PA-C   500 mcg at 04/20/18 0900     Discharge Medications: Please see discharge summary for a list of discharge medications.  Relevant Imaging Results:  Relevant Lab Results:   Additional Information [id number]  [name], LCSW

## 2018-04-20 NOTE — Discharge Summary (Signed)
Discharge summary job [id number]

## 2018-04-20 NOTE — Patient Care Conference (Signed)
Inpatient RehabilitationTeam Conference and Plan of Care Update Date: 04/20/2018   Time: 10:30 AM    Patient Name: Anthony Hayes      Medical Record Number: [id number]  Date of Birth: [birth year] Sex: Male         Room/Bed: 4W19C/4W19C-01 Payor Info: Payor: MEDICARE / Plan: MEDICARE PART A AND B / Product Type: *No Product type* /    Admitting Diagnosis: CVA  Admit Date/Time:  04/06/2018  4:07 PM Admission Comments: No comment available   Primary Diagnosis:  <principal problem not specified> Principal Problem: <principal problem not specified>  Patient Active Problem List   Diagnosis Date Noted  . Sleep disturbance   . Stage 3 chronic kidney disease ([place])   . Benign essential HTN   . Hypokalemia   . Hyponatremia   . Infarction of right basal ganglia ([place]) 04/06/2018  . Pressure injury of skin 04/06/2018  . Vitamin B12 deficiency   . Hypothyroidism   . Crohn's disease with complication ([place])   . Benign prostatic hyperplasia   . Acute lower UTI   . Dementia without behavioral disturbance   . Acute ischemic stroke ([place]) 04/05/2018  . Leukocytosis   . Shortness of breath   . Acute encephalopathy   . CVA (cerebral vascular accident) ([place]) 04/03/2018  . Calculus, kidney 01/21/2018  . Acute blood loss anemia   . Mallory-Weiss syndrome   . Esophageal stricture   . Respiratory failure ([place])   . UGIB (upper gastrointestinal bleed) 10/16/2017  . GERD (gastroesophageal reflux disease) 10/16/2017  . Essential hypertension, benign 10/16/2017  . Chest pain 10/15/2017  . Hypothyroidism (acquired) 06/08/2017  . Chronic kidney disease, stage III (moderate) ([place]) 03/19/2016  . Dyslipidemia 03/19/2016  . Degeneration of lumbar or lumbosacral intervertebral disc 07/10/2008  . Arthropathy, lower leg 05/23/2004    Expected Discharge Date: Expected Discharge Date: 04/22/18  Team Members Present: Physician leading conference: Dr. Delice Lesch Social Worker Present: Ovidio Kin, LCSW Nurse  Present: Isla Pence, RN PT Present: Lavone Nian, PT OT Present: Napoleon Form, OT SLP Present: Stormy Fabian, SLP PPS Coordinator present : Daiva Nakayama, RN, CRRN     Current Status/Progress Goal Weekly Team Focus  Medical   Patient with slow cognitive progress.  UTI has been treated.  He  is eating better.  Electrolytes are improving  Stabilized medically for next venue of care  BP management, sleep/wake   Bowel/Bladder   incont of bladder at times; lbm 4/21  cont of B/B with min assist  timed toileting q2-3hr while awake   Swallow/Nutrition/ Hydration             ADL's   Close supervision-min A overall with VCs for safety awareness and task initiation  Supervision overall   ADL re-training, functional transfers, functional activity tolerance, d/c planning   Mobility   supervision overall with rollator, can require more assistance depending on alertness/fatigue level, impaired cognition but per daughter pt is at baseline  supervision<>min assist overall with LRAD  balance, NMR, transfers, gait, endurance, strengthening, safety awareness, pt education   Communication             Safety/Cognition/ Behavioral Observations  Min-Mod A  Min A   compensatory memory strategies, basic problem solving and sustained attention   Pain   no c/o pain  <2 out of 10  assess q shift and prn   Skin   ecchymosis to abd and bilat upper extremities  free from infection/breakdown during rehab stay  assess q shift  and prn      *See Care Plan and progress notes for long and short-term goals.     Barriers to Discharge  Current Status/Progress Possible Resolutions Date Resolved   Physician    Medical stability;Behavior        Supervision      Nursing                  PT  Behavior;Incontinence  pre-morbid dementia, baseline incontinence              OT                  SLP                SW                Discharge Planning/Teaching Needs:  Daughter's want pt to return to [facility]  and working with facility on this, may need to have more assistance than did prior to admission here      Team Discussion:  Still requiring supervision-min with ambulation and cueing for tasks to complete. Flucuates levels depending upon fatigue and alertness. Needs brace for his left leg for stability. Monitor PVR's for retention. Would need someone with him in his ALF room for safety or pursue NH for a short time  Revisions to Treatment Plan:  NH versus ALF with hired caregivers    Continued Need for Acute Rehabilitation Level of Care: The patient requires daily medical management by a physician with specialized training in physical medicine and rehabilitation for the following conditions: Daily direction of a multidisciplinary physical rehabilitation program to ensure safe treatment while eliciting the highest outcome that is of practical value to the patient.: Yes Daily medical management of patient stability for increased activity during participation in an intensive rehabilitation regime.: Yes Daily analysis of laboratory values and/or radiology reports with any subsequent need for medication adjustment of medical intervention for : Neurological problems  Malaika Arnall, Gardiner Rhyme 04/20/2018, 2:02 PM

## 2018-04-20 NOTE — Progress Notes (Addendum)
Social Work Patient ID: Anthony Hayes, male   DOB: Nov 29, 1935, 83 y.o.   MRN: [id number]   Spoken with [name] to discuss team conference and goals. She and her sister [name] feel maybe pt should go to a NH and continue his rehab then back to [facility]. Will send out FL2 and see if can find bed at [facility] or [facility], daughter's choices. Pt reluctantly agreed to this. [facility] will have a bed on Friday, informed daughter and she will contact [facility] regarding paperwork to complete for transfer on Friday/

## 2018-04-20 NOTE — Progress Notes (Addendum)
Subjective/Complaints:  Patient sleeping soundly when I entered the room.  He denied any complaints.  Told me he slept well.  Denies pain.  Awoke easily  ROS: Patient denies fever, rash, sore throat, blurred vision, nausea, vomiting, diarrhea, cough, shortness of breath or chest pain, joint or back pain, headache, or mood change.    Objective: Vital Signs: Blood pressure (!) 109/54, pulse 64, temperature 98 F (36.7 C), temperature source Oral, resp. rate (!) 98, height 5' 7" (1.702 m), weight 76.1 kg (167 lb 12.3 oz), SpO2 98 %. No results found. Results for orders placed or performed during the hospital encounter of 04/06/18 (from the past 72 hour(s))  Basic metabolic panel     Status: Abnormal   Collection Time: 04/18/18  6:12 AM  Result Value Ref Range   Sodium 133 (L) 135 - 145 mmol/L    Comment: POST-ULTRACENTRIFUGATION   Potassium 4.1 3.5 - 5.1 mmol/L   Chloride 105 101 - 111 mmol/L   CO2 14 (L) 22 - 32 mmol/L   Glucose, Bld 100 (H) 65 - 99 mg/dL   BUN 36 (H) 6 - 20 mg/dL   Creatinine, Ser 1.37 (H) 0.61 - 1.24 mg/dL   Calcium 9.1 8.9 - 10.3 mg/dL   GFR calc non Af Amer 47 (L) >60 mL/min   GFR calc Af Amer 55 (L) >60 mL/min    Comment: (NOTE) The eGFR has been calculated using the CKD EPI equation. This calculation has not been validated in all clinical situations. eGFR's persistently <60 mL/min signify possible Chronic Kidney Disease.    Anion gap 14 5 - 15    Comment: Performed at [facility] [address], [place], [place] [address]     Constitutional: No distress . Vital signs reviewed. HEENT: EOMI, oral membranes moist Neck: supple Cardiovascular: RRR without murmur. No JVD    Respiratory: CTA Bilaterally without wheezes or rales. Normal effort    GI: BS +, non-tender, non-distended  Neuro: aroused easily.  Motor 3+/5 Left delt, Biceps , triceps and grip--no changes 3+/5 HF, KE ADF (inconsistent) 4/5 RUE  Musc/Skel:  No edema or tenderness in  extremities. Gen NAD, vital signs reviewed. Psychiatric: Withdrawn  Assessment/Plan: 1. Functional deficits secondary to RIght BG infarction which require 3+ hours per day of interdisciplinary therapy in a comprehensive inpatient rehab setting. Physiatrist is providing close team supervision and 24 hour management of active medical problems listed below. Physiatrist and rehab team continue to assess barriers to discharge/monitor patient progress toward functional and medical goals. FIM: Function - Bathing Bathing activity did not occur: Refused Position: Wheelchair/chair at sink Body parts bathed by patient: Right arm, Left arm, Chest, Abdomen, Front perineal area, Right upper leg, Left upper leg, Right lower leg, Left lower leg, Buttocks Body parts bathed by helper: Back Assist Level: Supervision or verbal cues  Function- Upper Body Dressing/Undressing What is the patient wearing?: Pull over shirt/dress Pull over shirt/dress - Perfomed by patient: Thread/unthread right sleeve, Thread/unthread left sleeve, Pull shirt over trunk, Put head through opening Pull over shirt/dress - Perfomed by helper: Put head through opening Assist Level: Supervision or verbal cues Function - Lower Body Dressing/Undressing What is the patient wearing?: Shoes, Pants Position: Wheelchair/chair at sink Pants- Performed by patient: Thread/unthread right pants leg, Thread/unthread left pants leg, Pull pants up/down Pants- Performed by helper: Thread/unthread right pants leg, Thread/unthread left pants leg, Pull pants up/down Non-skid slipper socks- Performed by patient: Don/doff right sock, Don/doff left sock Socks - Performed  by patient: Don/doff right sock, Don/doff left sock Socks - Performed by helper: Don/doff right sock, Don/doff left sock Shoes - Performed by patient: Don/doff right shoe, Don/doff left shoe, Fasten right, Fasten left Shoes - Performed by helper: Don/doff right shoe, Don/doff left shoe,  Fasten left, Fasten right Assist for footwear: Supervision/touching assist Assist for lower body dressing: Touching or steadying assistance (Pt > 75%)  Function - Toileting Toileting activity did not occur: N/A Toileting steps completed by patient: Adjust clothing prior to toileting, Performs perineal hygiene, Adjust clothing after toileting Toileting steps completed by helper: Performs perineal hygiene Toileting Assistive Devices: Grab bar or rail Assist level: Touching or steadying assistance (Pt.75%)  Function - Air cabin crew transfer activity did not occur: Safety/medical concerns Toilet transfer assistive device: Elevated toilet seat/BSC over toilet, Grab bar, Walker Assist level to toilet: Touching or steadying assistance (Pt > 75%) Assist level from toilet: Touching or steadying assistance (Pt > 75%)  Function - Chair/bed transfer Chair/bed transfer method: Ambulatory Chair/bed transfer assist level: Supervision or verbal cues Chair/bed transfer assistive device: Armrests, Walker Chair/bed transfer details: Verbal cues for sequencing, Verbal cues for precautions/safety  Function - Locomotion: Wheelchair Type: Manual Max wheelchair distance: 100 ft (BLE) Assist Level: Supervision or verbal cues Wheel 50 feet with 2 turns activity did not occur: Safety/medical concerns(fatigue) Assist Level: Supervision or verbal cues Wheel 150 feet activity did not occur: Safety/medical concerns Function - Locomotion: Ambulation Assistive device: Other (comment)(rollator) Max distance: 100 ft Assist level: Touching or steadying assistance (Pt > 75%) Assist level: Touching or steadying assistance (Pt > 75%) Walk 50 feet with 2 turns activity did not occur: Safety/medical concerns Assist level: Touching or steadying assistance (Pt > 75%) Walk 150 feet activity did not occur: Safety/medical concerns Assist level: Supervision or verbal cues Walk 10 feet on uneven surfaces activity  did not occur: Safety/medical concerns  Function - Comprehension Comprehension: Auditory Comprehension assist level: Understands basic 75 - 89% of the time/ requires cueing 10 - 24% of the time  Function - Expression Expression: Verbal Expression assist level: Expresses basic 90% of the time/requires cueing < 10% of the time.  Function - Social Interaction Social Interaction assist level: Interacts appropriately 90% of the time - Needs monitoring or encouragement for participation or interaction.  Function - Problem Solving Problem solving assist level: Solves basic 75 - 89% of the time/requires cueing 10 - 24% of the time  Function - Memory Memory assist level: Recognizes or recalls 75 - 89% of the time/requires cueing 10 - 24% of the time, Recognizes or recalls 50 - 74% of the time/requires cueing 25 - 49% of the time Patient normally able to recall (first 3 days only): That he or she is in a hospital  Medical Problem List and Plan: 1.  Altered mental status with decreased functional mobility secondary to right basal ganglia infarction.  Plan aspirin and Plavix times 3 weeks then Plavix alone  Continue CIR PT, OT, SLP, team conference today, SNF pending 2.  DVT Prophylaxis/Anticoagulation: Subcutaneous heparin. 3. Pain Management: Tylenol as needed 4. Mood/mild dementia.  Tofranil 25 mg nightly stopped due to AMS  - trial melatonin for sleep--consider other options Will try to get baseline from family 5. Neuropsych: This patient is capable of making decisions on his own behalf.- per SLP def appear chronic    6. Skin/Wound Care: Routine skin checks 7. Fluids/Electrolytes/Nutrition: Routine I/O's,    Intake improved 8.  CKD stage III.     Stable  per labs 4/22   Continue to monitor 9.  Hypertension.  HCTZ 25 mg daily.  Monitor with increased mobility    Vitals:   04/19/18 1416 04/20/18 0052  BP: 121/74 (!) 109/54  Pulse: 66 64  Resp: 16 (!) 98  Temp: 98.4 F (36.9 C) 98 F  (36.7 C)  SpO2: 96% 98%  Controlled 4/24,    Continue to hold HCTZ 10.  History of BPH.  Currently on both Flomax and Ditropan as prior to admission.     Monitor PVRs per nsg 11.  UTI/leukocytosis.   urine culture shows Klebsiella, changed to Keflex since 4/15---can d/c   Blood cultures x2 NG final  on 4/15   WBCs 12.0 on 4/11   Afebrile     12.  Hyperlipidemia.  Pravachol 13.  Multiple colon resections due to Crohn's disease.  Continue to monitor 14.  Hypothyroidism.  TSH 4.601.Synthroid,   0.41m---daily 15.  Vitamin B12 deficiency.  Continue vitamin B12 16.  Knee and hip contracture, amb with walker at home 17.  Hypokalemia supplementing with KCl 20 mEq/day   Potassium 3.8 on 4/11   Continue to monitor 18. Hyponatremia  Sodium 134 on 4/11, recheck BMET 4/22 was stable at 133  Continue to monitor 19.  Asymptomatic brady previously documented sinus brady on EKG ( also R and L BBB)    -hr in 60's  LOS (Days) 14 A FACE TO FACE EVALUATION WAS PERFORMED  ZMeredith Staggers4/24/2019, 12:09 PM

## 2018-04-20 NOTE — Progress Notes (Signed)
Physical Therapy Session Note  Patient Details  Name: Anthony Hayes MRN: [id number] Date of Birth: [birth year]  Today's Date: 04/20/2018 PT Individual Time: 0800-0915 PT Individual Time Calculation (min): 75 min   Short Term Goals: Week 1:  PT Short Term Goal 1 (Week 1): Pt will perform bed mobility with S PT Short Term Goal 1 - Progress (Week 1): Not met PT Short Term Goal 2 (Week 1): Pt will perform stand pivot transfer minA PT Short Term Goal 2 - Progress (Week 1): Met PT Short Term Goal 3 (Week 1): Pt will ambulate x50' modA PT Short Term Goal 3 - Progress (Week 1): Met PT Short Term Goal 4 (Week 1): Pt will perform w/c propulsion x50' with minA PT Short Term Goal 4 - Progress (Week 1): Not met  Skilled Therapeutic Interventions/Progress Updates:   Pt received in bed, sleeping. Pt required extended time to wake up but agreeable to therapy with encouragement. Pt with no c/o pain. Pt donned shirt and shorts with assistance. Pt reporting needing to void, ambulated to bathroom with rollator and min assist, transferred to [facility] w/ use of grab rails. Pt had continent void. Pt required assistance to don new brief and min assist for standing balance. Pt donned shoes and socks with assistance and [employer] representative delivered L Foot Up brace. Pt educated on how to don/doff L Foot Up brace. Pt ambulated w/ rollator and min assist to [facility] gym to perform Dynavision. Pt demonstrated decreased bilateral heel strike, decreased L DF, and poor rollator management despite verbal cuing to correct. Pt demonstrated decreased attention and flat affect throughout session. Pt engaged in Dynavision standing on compliant surface w/o UE support and min assist to improve attention to task, reaction time, and static standing balance. Pt instructed to attend to green lights only (not red) and average reaction time on first trial was 2.29s w/ L quadrants having least accuracy and extended reaction time compared to R. On second  trial, pt average reaction time was 2.01s and L quadrants remained slow and less accurate. Pt performed NuStep level 4 x 10 min w/ BUE and BLE for strengthening and endurance. Pt transported back to room via w/c total assist 2/2 fatigue. At end of session, pt left seated in w/c, quick release belt on, chair alarm on, and all needs within reach.    Therapy Documentation Precautions:  Precautions Precautions: Fall Precaution Comments: L side hemiplegia and ataxia Restrictions Weight Bearing Restrictions: No   See Function Navigator for Current Functional Status.   Therapy/Group: Individual Therapy  Caffie Damme 04/20/2018, 9:36 AM

## 2018-04-21 ENCOUNTER — Inpatient Hospital Stay (HOSPITAL_COMMUNITY): Payer: Medicare Other | Admitting: Physical Therapy

## 2018-04-21 ENCOUNTER — Inpatient Hospital Stay (HOSPITAL_COMMUNITY): Payer: Medicare Other | Admitting: Occupational Therapy

## 2018-04-21 ENCOUNTER — Inpatient Hospital Stay (HOSPITAL_COMMUNITY): Payer: Medicare Other | Admitting: Speech Pathology

## 2018-04-21 NOTE — Progress Notes (Signed)
Occupational Therapy Session Note  Patient Details  Name: Anthony Hayes MRN: [id number] Date of Birth: Sep 15, 1935  Today's Date: 04/21/2018 OT Individual Time: 0845-1000 OT Individual Time Calculation (min): 75 min    Short Term Goals: Week 2:  OT Short Term Goal 1 (Week 2): STG=LTG due to LOS  Skilled Therapeutic Interventions/Progress Updates:    Pt seen for OT session focusing on ADL re-training and functional ambulation/mobility and functional standing balance and endurance.. Pt self propelling w/c out of room using B LEs upon arrival, re-oriented to situation and pt agreeable to tx session. He completed stand pivot transfer to toilet with use of grab bars and completed 3/3 toileting tasks with guarding assist. He ambulated out of bathrrom with rollator. Grooming tasks completed from standing position at sink with close supervision. Pt declined shower this session, completed sponge bath from seated position on rollator, requiring VCs for intiation and sequencing of task. Pt stood with supervision at sink to complete pericare/buttock hygiene, incontinent of urine upon standing.  Pt able to recall 3/3 directions needed to find therapy gym, ambulating with CGA and rollator to gym, did not require seated rest break this session. In gym, completed standing nuts/bolts activity, able to use B UEs simulatesnouly in order to screw pieces together mod A to correctly identify matching pieces. Pt tolerated ~6 minutes of standing before requiring seated rest break.  Pt left seated in w/c at end of session, QRB and chair alarm on, all needs in reach.     Therapy Documentation Precautions:  Precautions Precautions: Fall Precaution Comments: L side hemiplegia and ataxia Restrictions Weight Bearing Restrictions: No Pain:   No/denies pain  See Function Navigator for Current Functional Status.   Therapy/Group: Individual Therapy  Jalene Lacko L 04/21/2018, 7:05 AM

## 2018-04-21 NOTE — Progress Notes (Signed)
Subjective/Complaints:  Patient without new issues.  Uneventful night.  ROS: Patient denies fever, rash, sore throat, blurred vision, nausea, vomiting, diarrhea, cough, shortness of breath or chest pain, joint or back pain, headache, or mood change.    Objective: Vital Signs: Blood pressure 131/71, pulse 67, temperature 98.1 F (36.7 C), temperature source Oral, resp. rate 18, height 5' 7"  (1.702 m), weight 76.1 kg (167 lb 12.3 oz), SpO2 97 %. No results found. No results found for this or any previous visit (from the past 72 hour(s)).   Constitutional: No distress . Vital signs reviewed. HEENT: EOMI, oral membranes moist Neck: supple Cardiovascular: RRR without murmur. No JVD    Respiratory: CTA Bilaterally without wheezes or rales. Normal effort    GI: BS +, non-tender, non-distended  Neuro: Fairly alert, limited insight and awareness Motor 3+/5 Left delt, Biceps , triceps and grip--no changes 3+/5 HF, KE ADF (inconsistent) 4/5 RUE  Musc/Skel:  No edema or tenderness in extremities. Gen NAD, vital signs reviewed. Psychiatric: Withdrawn  Assessment/Plan: 1. Functional deficits secondary to RIght BG infarction which require 3+ hours per day of interdisciplinary therapy in a comprehensive inpatient rehab setting. Physiatrist is providing close team supervision and 24 hour management of active medical problems listed below. Physiatrist and rehab team continue to assess barriers to discharge/monitor patient progress toward functional and medical goals. FIM: Function - Bathing Bathing activity did not occur: Refused Position: Wheelchair/chair at sink Body parts bathed by patient: Right arm, Left arm, Chest, Abdomen, Front perineal area, Right upper leg, Left upper leg, Right lower leg, Left lower leg, Buttocks Body parts bathed by helper: Back Assist Level: Supervision or verbal cues  Function- Upper Body Dressing/Undressing What is the patient wearing?: Pull over  shirt/dress Pull over shirt/dress - Perfomed by patient: Thread/unthread right sleeve, Thread/unthread left sleeve, Pull shirt over trunk, Put head through opening Pull over shirt/dress - Perfomed by helper: Put head through opening Assist Level: Supervision or verbal cues Function - Lower Body Dressing/Undressing What is the patient wearing?: Shoes, Pants Position: Wheelchair/chair at sink Pants- Performed by patient: Thread/unthread right pants leg, Thread/unthread left pants leg, Pull pants up/down Pants- Performed by helper: Thread/unthread right pants leg, Thread/unthread left pants leg, Pull pants up/down Non-skid slipper socks- Performed by patient: Don/doff right sock, Don/doff left sock Socks - Performed by patient: Don/doff right sock, Don/doff left sock Socks - Performed by helper: Don/doff right sock, Don/doff left sock Shoes - Performed by patient: Don/doff right shoe, Don/doff left shoe, Fasten right, Fasten left Shoes - Performed by helper: Don/doff right shoe, Don/doff left shoe, Fasten left, Fasten right Assist for footwear: Supervision/touching assist Assist for lower body dressing: Supervision or verbal cues  Function - Toileting Toileting activity did not occur: N/A Toileting steps completed by patient: Adjust clothing prior to toileting, Performs perineal hygiene, Adjust clothing after toileting Toileting steps completed by helper: Performs perineal hygiene Toileting Assistive Devices: Grab bar or rail Assist level: Supervision or verbal cues  Function Midwife transfer activity did not occur: Safety/medical concerns Toilet transfer assistive device: Elevated toilet seat/BSC over toilet, Grab bar Assist level to toilet: Supervision or verbal cues Assist level from toilet: Supervision or verbal cues  Function - Chair/bed transfer Chair/bed transfer method: Ambulatory Chair/bed transfer assist level: Supervision or verbal cues Chair/bed transfer  assistive device: Armrests, Walker Chair/bed transfer details: Verbal cues for sequencing, Verbal cues for precautions/safety  Function - Locomotion: Wheelchair Type: Manual Max wheelchair distance: 100 ft (BLE) Assist Level: Supervision  or verbal cues Wheel 50 feet with 2 turns activity did not occur: Safety/medical concerns(fatigue) Assist Level: Supervision or verbal cues Wheel 150 feet activity did not occur: Safety/medical concerns Function - Locomotion: Ambulation Assistive device: Other (comment)(rollator) Max distance: 100 ft Assist level: Touching or steadying assistance (Pt > 75%) Assist level: Touching or steadying assistance (Pt > 75%) Walk 50 feet with 2 turns activity did not occur: Safety/medical concerns Assist level: Touching or steadying assistance (Pt > 75%) Walk 150 feet activity did not occur: Safety/medical concerns Assist level: Supervision or verbal cues Walk 10 feet on uneven surfaces activity did not occur: Safety/medical concerns  Function - Comprehension Comprehension: Auditory Comprehension assist level: Understands basic 75 - 89% of the time/ requires cueing 10 - 24% of the time  Function - Expression Expression: Verbal Expression assist level: Expresses basic 90% of the time/requires cueing < 10% of the time.  Function - Social Interaction Social Interaction assist level: Interacts appropriately 75 - 89% of the time - Needs redirection for appropriate language or to initiate interaction.  Function - Problem Solving Problem solving assist level: Solves basic 75 - 89% of the time/requires cueing 10 - 24% of the time  Function - Memory Memory assist level: Recognizes or recalls less than 25% of the time/requires cueing greater than 75% of the time Patient normally able to recall (first 3 days only): That he or she is in a hospital  Medical Problem List and Plan: 1.  Altered mental status with decreased functional mobility secondary to right basal  ganglia infarction.  Plan aspirin and Plavix times 3 weeks then Plavix alone  Continue CIR PT, OT, SLP, team conference today, SNF pending 2.  DVT Prophylaxis/Anticoagulation: Subcutaneous heparin. 3. Pain Management: Tylenol as needed 4. Mood/mild dementia.  Tofranil 25 mg nightly stopped due to AMS  -Continue trial melatonin for sleep--  Will try to get baseline from family 5. Neuropsych: This patient is capable of making decisions on his own behalf.- per SLP def appear chronic    6. Skin/Wound Care: Routine skin checks 7. Fluids/Electrolytes/Nutrition: Routine I/O's,    Intake improved 8.  CKD stage III.     Stable per labs 4/22   Continue to monitor 9.  Hypertension.  HCTZ 25 mg daily.  Monitor with increased mobility    Vitals:   04/20/18 0052 04/20/18 1431  BP: (!) 109/54 131/71  Pulse: 64 67  Resp: (!) 98 18  Temp: 98 F (36.7 C) 98.1 F (36.7 C)  SpO2: 98% 97%  Controlled 4/25,    Continue to hold HCTZ 10.  History of BPH.  Currently on both Flomax and Ditropan as prior to admission.     Monitor PVRs per nsg 11.  UTI/leukocytosis.   urine culture shows Klebsiella, changed to Keflex since 4/15---can d/c   Blood cultures x2 NG final  on 4/15   WBCs 12.0 on 4/11   Afebrile     12.  Hyperlipidemia.  Pravachol 13.  Multiple colon resections due to Crohn's disease.  Continue to monitor 14.  Hypothyroidism.  TSH 4.601.Synthroid,   0.50m---daily 15.  Vitamin B12 deficiency.  Continue vitamin B12 16.  Knee and hip contracture, amb with walker at home 17.  Hypokalemia supplementing with KCl 20 mEq/day   Potassium 3.8 on 4/11   Continue to monitor 18. Hyponatremia  Sodium 134 on 4/11, recheck BMET 4/22 was stable at 133  Continue to monitor 19.  Asymptomatic brady previously documented sinus brady on EKG ( also  R and L BBB)    -hr remains in 60's  LOS (Days) 15 A FACE TO FACE EVALUATION WAS PERFORMED  Meredith Staggers 04/21/2018, 10:23 AM

## 2018-04-21 NOTE — Progress Notes (Signed)
Occupational Therapy Discharge Summary  Patient Details  Name: Anthony Hayes MRN: [id number] Date of Birth: 06-Jan-1935  Patient has met 10 of 10 long term goals due to improved activity tolerance, improved balance, postural control, functional use of  LEFT upper extremity and improved coordination.  Patient to discharge at [facility] Assist level.  Patient was living in ALF prior to admission and ambulating functionally at mod I level. He currently requires very close supervision- min A with consistent cuing for safety awareness and attention during functional tasks. ALF does not provide this level of care and therefore pt to d/c to SNF in order to continue to increase independence with ADLs and reduce caregiver burden.  Patient can be inconsistent with performance depending on arousal level/ fatigue and attention to task. He can complete basic ADLs at close supervision level, however, can occasionally require min- mod A.   Recommendation:  Patient will benefit from ongoing skilled OT services in skilled nursing facility setting to continue to advance functional skills in the area of BADL and Reduce care partner burden.  Equipment: To be determined at next venue of care  Reasons for discharge: treatment goals met and discharge from hospital  Patient/family agrees with progress made and goals achieved: Yes  OT Discharge Precautions/Restrictions  Precautions Precautions: Fall Precaution Comments: L side hemiplegia and ataxia; dementia Restrictions Weight Bearing Restrictions: No Vision Baseline Vision/History: Wears glasses Wears Glasses: At all times Patient Visual Report: No change from baseline Vision Assessment?: No apparent visual deficits Perception  Perception: Within Functional Limits Praxis Praxis: Impaired Praxis Impairment Details: Motor planning Cognition Overall Cognitive Status: History of cognitive impairments - at baseline Arousal/Alertness: Awake/alert Orientation  Level: Oriented X4 Attention: Sustained Sustained Attention: Impaired Sustained Attention Impairment: Functional basic;Verbal basic Memory: Impaired Memory Impairment: Decreased short term memory;Decreased recall of new information;Storage deficit Decreased Short Term Memory: Verbal basic;Functional basic Awareness: Impaired Awareness Impairment: Intellectual impairment Problem Solving: Impaired Problem Solving Impairment: Verbal basic;Functional basic Executive Function: (All impaired due to lower level deficits) Safety/Judgment: Impaired Comments: Decreased safety awareness; decreased awareness of deficits Sensation Sensation Light Touch: Appears Intact Proprioception: Appears Intact Coordination Gross Motor Movements are Fluid and Coordinated: No Fine Motor Movements are Fluid and Coordinated: Yes Coordination and Movement Description: Generalized weakness/deconditioning with mild L hemiplegia Heel Shin Test: impaired LLE Motor  Motor Motor: Motor apraxia;Hemiplegia;Ataxia Motor - Skilled Clinical Observations: L side weakness and apraxia; generalized weakness with flexed posture Motor - Discharge Observations: L side weakness and apraxia; generalized weakness with flexed posture Trunk/Postural Assessment  Cervical Assessment Cervical Assessment: Exceptions to WFL(Forward head) Thoracic Assessment Thoracic Assessment: Exceptions to WFL(Kyphotic) Lumbar Assessment Lumbar Assessment: Exceptions to WFL(Posterior pelvic tilt) Postural Control Postural Control: Within Functional Limits  Balance Balance Balance Assessed: Yes Static Sitting Balance Static Sitting - Balance Support: Feet supported;No upper extremity supported Static Sitting - Level of Assistance: 5: Stand by assistance;6: Modified independent (Device/Increase time) Dynamic Sitting Balance Dynamic Sitting - Balance Support: During functional activity;Feet supported Dynamic Sitting - Level of Assistance: 5:  Stand by assistance Dynamic Sitting - Balance Activities: Reaching for objects Sitting balance - Comments: Sitting to complete dressing tasks Static Standing Balance Static Standing - Balance Support: During functional activity;No upper extremity supported Static Standing - Level of Assistance: 5: Stand by assistance Dynamic Standing Balance Dynamic Standing - Balance Support: During functional activity;Right upper extremity supported;Left upper extremity supported Dynamic Standing - Level of Assistance: 5: Stand by assistance;4: Min assist Dynamic Standing - Balance Activities: Other (comment)(gait) Dynamic Standing -  Comments: Standing to complete LB dressing/toileting tasks Extremity/Trunk Assessment RUE Assessment RUE Assessment: Exceptions to [facility] RUE AROM (degrees) Overall AROM Right Upper Extremity: Deficits;Due to premorbid status RUE Overall AROM Comments: Unable to complete shoulder flexion > ~90 degrees RUE Strength RUE Overall Strength: Deficits;Due to premorbid status(3/5 shoulder flexion; 4/5 all other major muscle groups) LUE Assessment LUE Assessment: Exceptions to WFL LUE AROM (degrees) Overall AROM Left Upper Extremity: Deficits LUE Overall AROM Comments: Unable to complete shoulder flexion > ~90 degrees LUE Strength LUE Overall Strength: Deficits LUE Overall Strength Comments: 3/5 shoulder flexion; 4/5 all other major muscle groups   See Function Navigator for Current Functional Status.  Vitaly Wanat L 04/21/2018, 11:21 AM

## 2018-04-21 NOTE — Progress Notes (Signed)
Speech Language Pathology Discharge Summary  Patient Details  Name: Anthony Hayes MRN: [id number] Date of Birth: [birth year]  Today's Date: 04/21/2018 SLP Individual Time:  -      Skilled Therapeutic Interventions:  Skilled treatment session focused on cognition goals. SLP facilitated session by providing Min A cues to review compensatory memory strategies. Pt able to recall current discharge plan and is agreeable. With Min to Mod A he is able to answer intellectual awareness questions. Pt able to problem solve how to organize his stuff at bedside table. Pt was left upright in wheelchair with safety belt donned and chair alarm on.    Patient has met 4 of 4 long term goals.  Patient to discharge at [facility] level.   Clinical Impression/Discharge Summary:   Pt has made good progress in skilled ST as and a result he has met 4 of 4 LTGs. He currently requires Min A and would benefit from follow up skilled ST at next venue of care.   Care Partner:  Caregiver Able to Provide Assistance: No  Type of Caregiver Assistance: Physical;Cognitive  Recommendation:  Skilled Nursing facility      Equipment:     Reasons for discharge: Treatment goals met;Discharged from hospital   Patient/Family Agrees with Progress Made and Goals Achieved: Yes   Function:  Eating Eating   Modified Consistency Diet: No Eating Assist Level: More than reasonable amount of time           Cognition Comprehension Comprehension assist level: Understands basic 75 - 89% of the time/ requires cueing 10 - 24% of the time  Expression   Expression assist level: Expresses basic 90% of the time/requires cueing < 10% of the time.  Social Interaction Social Interaction assist level: Interacts appropriately 75 - 89% of the time - Needs redirection for appropriate language or to initiate interaction.  Problem Solving Problem solving assist level: Solves basic 75 - 89% of the time/requires cueing 10 - 24% of the time   Memory Memory assist level: Recognizes or recalls less than 25% of the time/requires cueing greater than 75% of the time   Anthony Hayes 04/21/2018, 1:21 PM

## 2018-04-21 NOTE — Progress Notes (Addendum)
Physical Therapy Discharge Summary  Patient Details  Name: Anthony Hayes MRN: [id number] Date of Birth: [birth year]  Today's Date: 04/21/2018 PT Individual Time: 2458-0998 PT Individual Time Calculation (min): 55 min    Patient has met 5 of 11 long term goals due to improved balance, improved attention and improved coordination.  Patient to discharge at an ambulatory level [place].   Patient's care partner requires assistance to provide the necessary physical and cognitive assistance at discharge.  Reasons goals not met: Pt still requires min assist with functional transfers and mobility for safety due to balance and cognitive deficits. Family is not able to provide min assist level of care for patient so he will be d/c to SNF.  Recommendation:  Patient will benefit from ongoing skilled PT services in skilled nursing facility setting to continue to advance safe functional mobility, address ongoing impairments in balance, coordination, cognition, and minimize fall risk.  Equipment: No equipment provided. Pt already owns rollator/4WW.  Reasons for discharge: lack of progress toward goals and discharge from hospital  Patient/family agrees with progress made and goals achieved: Yes  Skilled Intervention Pt seated in w/c in room, agreeable to PT session. No complaints of pain. Sit to stand with min assist to 4WW. Ambulation x 100 ft with 4WW and foot-up brace for LLE,min assist for balance, verbal cues for heel/toe gait to improve LLE clearance as well as safe use of rollator. Ascend/descend 4 stairs with 2 handrails and min assist, verbal cues for safety and for step-to gait pattern. Manual w/c propulsion x 150 ft with increased time to complete, use of BLE, Supervision. Car transfer with min assist and [id number] with verbal cues for safety. Nustep level 1 x 6 min with B UE/LE for global strengthening and endurance. Pt requests to use the bathroom. Standing balance with no UE support and min assist  while urinating. Pt is able to complete 2/3 toileting steps and needs some assist with clothing management after toileting. Pt left seated in w/c in room with needs in reach, quick release belt and chair alarm in place.  PT Discharge Precautions/Restrictions Precautions Precautions: Fall Precaution Comments: L side hemiplegia and ataxia; dementia Restrictions Weight Bearing Restrictions: No Pain Pain Assessment Pain Score: 0-No pain Vision/Perception  Perception Perception: Within Functional Limits Praxis Praxis: Impaired Praxis Impairment Details: Motor planning  Cognition Overall Cognitive Status: History of cognitive impairments - at baseline Arousal/Alertness: Awake/alert Orientation Level: Oriented X4 Attention: Sustained Sustained Attention: Impaired Sustained Attention Impairment: Functional basic;Verbal basic Memory: Impaired Memory Impairment: Decreased short term memory;Decreased recall of new information;Storage deficit Decreased Short Term Memory: Verbal basic;Functional basic Awareness: Impaired Awareness Impairment: Intellectual impairment Problem Solving: Impaired Problem Solving Impairment: Verbal basic;Functional basic Executive Function: (All impaired due to lower level deficits) Safety/Judgment: Impaired Comments: Decreased safety awareness; decreased awareness of deficits Sensation Sensation Light Touch: Appears Intact Proprioception: Appears Intact Coordination Gross Motor Movements are Fluid and Coordinated: No Fine Motor Movements are Fluid and Coordinated: Yes Coordination and Movement Description: Generalized weakness/deconditioning with mild L hemiplegia Heel Shin Test: impaired LLE Motor  Motor Motor: Motor apraxia;Hemiplegia;Ataxia Motor - Skilled Clinical Observations: L side weakness and apraxia; generalized weakness with flexed posture Motor - Discharge Observations: L side weakness and apraxia; generalized weakness with flexed posture   Trunk/Postural Assessment  Cervical Assessment Cervical Assessment: Exceptions to WFL(Forward head) Thoracic Assessment Thoracic Assessment: Exceptions to WFL(Kyphotic) Lumbar Assessment Lumbar Assessment: Exceptions to WFL(Posterior pelvic tilt) Postural Control Postural Control: Within Functional Limits  Balance Balance Balance Assessed: Yes  Static Sitting Balance Static Sitting - Balance Support: Feet supported;No upper extremity supported Static Sitting - Level of Assistance: 5: Stand by assistance Dynamic Sitting Balance Dynamic Sitting - Balance Support: During functional activity;Feet supported Dynamic Sitting - Level of Assistance: 5: Stand by assistance Dynamic Sitting - Balance Activities: Reaching for objects Static Standing Balance Static Standing - Balance Support: During functional activity;No upper extremity supported Static Standing - Level of Assistance: 5: Stand by assistance Dynamic Standing Balance Dynamic Standing - Balance Support: During functional activity;Right upper extremity supported;Left upper extremity supported Dynamic Standing - Level of Assistance: 5: Stand by assistance;4: Min assist Dynamic Standing - Balance Activities: Other (comment)(gait) Extremity Assessment  RLE Assessment RLE Assessment: Exceptions to WFL(4/5 grossly) LLE Assessment LLE Assessment: Exceptions to WFL(4-/5 grossly)   See Function Navigator for Current Functional Status.   Excell Seltzer, PT, DPT 04/21/2018, 11:28 AM

## 2018-04-21 NOTE — Progress Notes (Signed)
Social Work Patient ID: Anthony Hayes, male   DOB: Dec 10, 1935, 83 y.o.   MRN: [id number] Spoke with kenish-[place] and [name] along with pt to discuss plan for tomorrow. He will be transferred to [facility] tomorrow afternoon, plan for more rehab and then back to [facility]. Pt is agreeable and gather paperwork for tomorrow.

## 2018-04-21 NOTE — Discharge Summary (Signed)
Anthony Hayes, Anthony Hayes NO.:  [id number]  MEDICAL RECORD NO.:  [id number]  LOCATION:  4W19C                        FACILITY:  [facility]  PHYSICIAN:  Charlett Blake, M.D.DATE OF BIRTH:  02-05-1935  DATE OF ADMISSION:  04/06/2018 DATE OF DISCHARGE:  04/22/2018                              DISCHARGE SUMMARY   DISCHARGE DIAGNOSES: 1. Right basal ganglia infarction. 2. Subcutaneous heparin for deep vein thrombosis prophylaxis. 3. Pain management. 4. Dementia. 5. Chronic kidney disease, stage III. 6. Hypertension. 7. History of benign prostatic hyperplasia. 8. Klebsiella urinary tract infection. 9. Hyperlipidemia. 10.Multiple colon resections due to Crohn's disease. 11.Hypothyroidism. 12.Vitamin B12 deficiency. 13.Hypokalemia, resolved.  HISTORY OF PRESENT ILLNESS:  This is an 83 year old right-handed male, history of mild dementia; previous CVA; BPH; CKD stage III; multiple colon resections due to Crohn's disease.  He is a resident of [facility].  He was able to complete his own bathing and dressing, walking to the dining hall for his meals using a walker. He does not drive.  Presented, April 03, 2018, with altered mental status.  There was some dizziness.  Cranial CT scan unremarkable for acute process.  MRI showed small areas of acute infarction affecting the posterior right basal ganglia and radiating white matter tracts on the right.  No swelling or hemorrhage.  Extensive chronic small-vessel disease.  He did not receive tPA.  Carotid Dopplers with no ICA stenosis.  Echocardiogram with ejection fraction of 60%.  No wall motion abnormalities.  No PFO.  Maintained on aspirin and Plavix for CVA prophylaxis.  Subcutaneous heparin for DVT prophylaxis.  Tolerating a regular diet.  On April 05, 2018, some increased lethargy.  Followup cranial CT scan as well as EEG negative for seizure as well as expected evolution of acute, subacute  infarctions.  Mild elevated white blood cell count 20,600, creatinine elevated 1.33.  Followup urine study; positive nitrite, leukocytes.  Culture was pending, placed on empiric Rocephin.  White count improved to 13,700, creatinine 1.20.  Physical and occupational therapy ongoing.  The patient was admitted for a comprehensive rehab program.  PAST MEDICAL HISTORY:  See discharge diagnoses.  SOCIAL HISTORY:  He is a resident of [place].  FUNCTIONAL STATUS:  Upon admission to [facility] with moderate assist, 3 feet 4-wheeled walker; minimal assist, sit to stand; mod-to- max assist, activities of daily living.  PHYSICAL EXAMINATION:  VITAL SIGNS:  Blood pressure 147/76, pulse 64, temperature 97, respirations 18. GENERAL:  This was an alert male, in no acute distress. HEENT:  EOMs intact. NECK:  Supple.  Nontender.  No JVD. CARDIAC:  Rate controlled. ABDOMEN:  Soft, nontender.  Good bowel sounds. LUNGS:  Clear to auscultation without wheeze.  He had a mild left facial droop, followed full commands.  REHABILITATION HOSPITAL COURSE:  The patient was admitted to Inpatient Rehab Services.  Therapies initiated on a 3-hour daily basis, consisting of physical therapy, occupational therapy, speech therapy and rehabilitation nursing.  The following issues were addressed during the patient's rehabilitation stay.  Pertaining to Mr. Speaker's right basal ganglia infarction, he remained on aspirin and Plavix therapy.  He would follow up  with Neurology Services.  Subcutaneous heparin for DVT prophylaxis, no bleeding episodes.  Noted mild dementia, appeared to be close to his baseline.  He had been on Tofranil, discontinued due to some altered mental status.  Placed on a trial of melatonin to aid in his sleep.  CKD, stage III, creatinine stable.  Blood pressure was controlled with no present antihypertensive medication.  He did have history of BPH.  He remained on  Ditropan, completed a course of Keflex for Klebsiella UTI.  Flomax ongoing daily.  The patient received weekly collaborative interdisciplinary team conferences to discuss estimated length of stay, family teaching, any barriers to his discharge.  Working with energy conservation techniques.  He can put on his shirt and shorts with assistance, he ambulates to the bathroom with a rolling walker and minimal assistance.  He can don his shoes and socks.  He ambulates with a rolling walker and minimal assistance.  Close monitoring of his safety.  There was suggestion of returning back to assisted living facility versus skilled nursing facility.  All issues discussed with family.  DISCHARGE MEDICATIONS:  Included: 1. Aspirin 81 mg p.o. daily. 2. Plavix 75 mg p.o. daily. 3. Atrovent 0.06% nasal spray, two sprays each nostril four times     daily. 4. Synthroid 50 mcg p.o. breakfast. 5. Claritin 10 mg p.o. daily. 6. Melatonin 3 mg p.o. at bedtime. 7. Ditropan 5 mg p.o. daily. 8. Protonix 40 mg p.o. b.i.d. 9. Florastor 250 mg p.o. b.i.d. 10.Flomax 0.4 mg p.o. daily. 11.Vitamin B12 500 mcg p.o. daily. 12.Tylenol as needed.  DIET:  His diet was a regular consistency.  FOLLOWUP:  He would follow up with Dr. Alysia Penna at the [facility] as directed; Dr. Erlinda Hong, Neurology Services, call for appointment.     Lauraine Rinne, P.A.   ______________________________ Charlett Blake, M.D.    DA/MEDQ  D:  04/20/2018  T:  04/21/2018  Job:  [id number]  cc:   [facility] Charlett Blake, M.D. Rosalin Hawking, M.D.

## 2018-04-21 NOTE — Progress Notes (Addendum)
Social Work  Discharge Note  The overall goal for the admission was met for:   Discharge location: No-[facility]  Length of Stay: Yes-16 DAYS  Discharge activity level: Yes-SUPERVISION-MIN ASSIST LEVEL  Home/community participation: Yes  Services provided included: MD, RD, PT, OT, SLP, RN, CM, TR, Pharmacy and SW  Financial Services: Medicare and Private Insurance: [place]  Follow-up services arranged: Other: SHORT TERM NHP  Comments (or additional information):NEEDS MORE REHAB UNTIL CAN RETURN BACK TO ALF-[facility].   Patient/Family verbalized understanding of follow-up arrangements: Yes  Individual responsible for coordination of the follow-up plan: [name]  Confirmed correct DME delivered: [name] 04/21/2018    [name]

## 2018-04-22 ENCOUNTER — Inpatient Hospital Stay (HOSPITAL_COMMUNITY): Payer: Medicare Other | Admitting: Speech Pathology

## 2018-04-22 ENCOUNTER — Inpatient Hospital Stay (HOSPITAL_COMMUNITY): Payer: Medicare Other | Admitting: Physical Therapy

## 2018-04-22 NOTE — Progress Notes (Signed)
Called facility twice, nurse not avl to take report, tried again and was told that may doing pt care, transferred me again...rang until call was disconnected. Will try again later.

## 2018-04-22 NOTE — Progress Notes (Signed)
Subjective/Complaints:  Pt up at EOB. Eating breakfast. No new complaints  ROS: Patient denies fever, rash, sore throat, blurred vision, nausea, vomiting, diarrhea, cough, shortness of breath or chest pain, joint or back pain, headache, or mood change.   Objective: Vital Signs: Blood pressure 113/81, pulse 63, temperature 97.8 F (36.6 C), temperature source Oral, resp. rate 17, height 5' 7"  (1.702 m), weight 76.1 kg (167 lb 12.3 oz), SpO2 94 %. No results found. No results found for this or any previous visit (from the past 72 hour(s)).   Constitutional: No distress . Vital signs reviewed. HEENT: EOMI, oral membranes moist Neck: supple Cardiovascular: RRR without murmur. No JVD    Respiratory: CTA Bilaterally without wheezes or rales. Normal effort    GI: BS +, non-tender, non-distended   Neuro: Fairly alert, limited insight and awareness Motor 3+/5 Left delt, Biceps , triceps and grip--no changes 3+/5 HF, KE ADF (inconsistent) 4/5 RUE  Musc/Skel:  No edema or tenderness in extremities. Gen NAD, vital signs reviewed. Psychiatric: Withdrawn  Assessment/Plan: 1. Functional deficits secondary to RIght BG infarction which require 3+ hours per day of interdisciplinary therapy in a comprehensive inpatient rehab setting. Physiatrist is providing close team supervision and 24 hour management of active medical problems listed below. Physiatrist and rehab team continue to assess barriers to discharge/monitor patient progress toward functional and medical goals. FIM: Function - Bathing Bathing activity did not occur: Refused Position: Wheelchair/chair at sink Body parts bathed by patient: Right arm, Left arm, Chest, Abdomen, Front perineal area, Right upper leg, Left upper leg, Right lower leg, Left lower leg, Buttocks Body parts bathed by helper: Back Assist Level: Supervision or verbal cues  Function- Upper Body Dressing/Undressing What is the patient wearing?: Pull over  shirt/dress Pull over shirt/dress - Perfomed by patient: Thread/unthread right sleeve, Thread/unthread left sleeve, Pull shirt over trunk, Put head through opening Pull over shirt/dress - Perfomed by helper: Put head through opening Assist Level: Supervision or verbal cues Function - Lower Body Dressing/Undressing What is the patient wearing?: Shoes, Pants Position: Wheelchair/chair at sink Pants- Performed by patient: Thread/unthread right pants leg, Thread/unthread left pants leg, Pull pants up/down Pants- Performed by helper: Thread/unthread right pants leg, Thread/unthread left pants leg, Pull pants up/down Non-skid slipper socks- Performed by patient: Don/doff right sock, Don/doff left sock Socks - Performed by patient: Don/doff right sock, Don/doff left sock Socks - Performed by helper: Don/doff right sock, Don/doff left sock Shoes - Performed by patient: Don/doff right shoe, Don/doff left shoe, Fasten right, Fasten left Shoes - Performed by helper: Don/doff right shoe, Don/doff left shoe, Fasten left, Fasten right Assist for footwear: Supervision/touching assist Assist for lower body dressing: Supervision or verbal cues  Function - Toileting Toileting activity did not occur: N/A Toileting steps completed by patient: Adjust clothing prior to toileting, Performs perineal hygiene, Adjust clothing after toileting Toileting steps completed by helper: Performs perineal hygiene Toileting Assistive Devices: Grab bar or rail Assist level: Supervision or verbal cues  Function Midwife transfer activity did not occur: Safety/medical concerns Toilet transfer assistive device: Elevated toilet seat/BSC over toilet, Grab bar Assist level to toilet: Supervision or verbal cues Assist level from toilet: Supervision or verbal cues  Function - Chair/bed transfer Chair/bed transfer method: Ambulatory Chair/bed transfer assist level: Touching or steadying assistance (Pt >  75%) Chair/bed transfer assistive device: Armrests, Walker Chair/bed transfer details: Verbal cues for sequencing, Verbal cues for precautions/safety, Verbal cues for safe use of DME/AE  Function - Locomotion:  Wheelchair Will patient use wheelchair at discharge?: No Type: Manual Max wheelchair distance: 150' Assist Level: Supervision or verbal cues Wheel 50 feet with 2 turns activity did not occur: Safety/medical concerns(fatigue) Assist Level: Supervision or verbal cues Wheel 150 feet activity did not occur: Safety/medical concerns Assist Level: Supervision or verbal cues Turns around,maneuvers to table,bed, and toilet,negotiates 3% grade,maneuvers on rugs and over doorsills: No Function - Locomotion: Ambulation Assistive device: Other (comment)(4WW) Max distance: 100' Assist level: Touching or steadying assistance (Pt > 75%) Assist level: Touching or steadying assistance (Pt > 75%) Walk 50 feet with 2 turns activity did not occur: Safety/medical concerns Assist level: Touching or steadying assistance (Pt > 75%) Walk 150 feet activity did not occur: Safety/medical concerns Assist level: Supervision or verbal cues Walk 10 feet on uneven surfaces activity did not occur: Safety/medical concerns  Function - Comprehension Comprehension: Auditory Comprehension assist level: Understands basic 75 - 89% of the time/ requires cueing 10 - 24% of the time  Function - Expression Expression: Verbal Expression assist level: Expresses basic 90% of the time/requires cueing < 10% of the time.  Function - Social Interaction Social Interaction assist level: Interacts appropriately 75 - 89% of the time - Needs redirection for appropriate language or to initiate interaction.  Function - Problem Solving Problem solving assist level: Solves basic 75 - 89% of the time/requires cueing 10 - 24% of the time  Function - Memory Memory assist level: Recognizes or recalls less than 25% of the time/requires  cueing greater than 75% of the time Patient normally able to recall (first 3 days only): That he or she is in a hospital  Medical Problem List and Plan: 1.  Altered mental status with decreased functional mobility secondary to right basal ganglia infarction.  Plan aspirin and Plavix times 3 weeks then Plavix alone  Continue CIR PT, OT, SLP, team conference today, SNF today 2.  DVT Prophylaxis/Anticoagulation: Subcutaneous heparin. 3. Pain Management: Tylenol as needed 4. Mood/mild dementia.  Tofranil 25 mg nightly stopped due to AMS  -Continue trial melatonin for sleep--  Will try to get baseline from family 5. Neuropsych: This patient is capable of making decisions on his own behalf.- per SLP def appear chronic    6. Skin/Wound Care: Routine skin checks 7. Fluids/Electrolytes/Nutrition: Routine I/O's,    Intake improved 8.  CKD stage III.     Stable per labs 4/22   Continue to monitor 9.  Hypertension.  HCTZ 25 mg daily.  Monitor with increased mobility    Vitals:   04/21/18 1448 04/22/18 0509  BP: 119/82 113/81  Pulse: (!) 58 63  Resp: 18 17  Temp: 98.2 F (36.8 C) 97.8 F (36.6 C)  SpO2: 100% 94%  Controlled 4/26,    Continue to hold HCTZ 10.  History of BPH.  Currently on both Flomax and Ditropan as prior to admission.     Monitor PVRs per nsg 11.  UTI/leukocytosis.   urine culture shows Klebsiella, changed to Keflex since 4/15---can d/c   Blood cultures x2 NG final  on 4/15   WBCs 12.0 on 4/11   Afebrile     12.  Hyperlipidemia.  Pravachol 13.  Multiple colon resections due to Crohn's disease.  Continue to monitor 14.  Hypothyroidism.  TSH 4.601.Synthroid,   0.47m---daily 15.  Vitamin B12 deficiency.  Continue vitamin B12 16.  Knee and hip contracture, amb with walker at home 17.  Hypokalemia supplementing with KCl 20 mEq/day   Potassium 3.8 on 4/11  Continue to monitor 18. Hyponatremia  Sodium 134 on 4/11, recheck BMET 4/22 was stable at 133  Continue to  monitor 19.  Asymptomatic brady previously documented sinus brady on EKG ( also R and L BBB)    -hr remains in 60's  LOS (Days) 16 A FACE TO FACE EVALUATION WAS PERFORMED  Meredith Staggers 04/22/2018, 9:05 AM

## 2018-04-22 NOTE — Progress Notes (Signed)
Pt d/c to [facility] with personal belongings including [facility].  Wilburn Cornelia friend of family is taking pt. D/C packet provided to her by SW.

## 2018-04-22 NOTE — Progress Notes (Signed)
[place] back was told to contact supv Amanda at [phone], was transferred to her vm.  VM id Apolonio Schneiders, left message to call me back.  Called Estill Bamberg on cell ph [phone], left message for her to rtn my call. Need to call report.

## 2018-04-22 NOTE — Plan of Care (Signed)
Pt d/c to [facility]. Pt met goals

## 2018-04-22 NOTE — Progress Notes (Signed)
Social Work Patient ID: Anthony Hayes, male   DOB: October 30, 1935, 83 y.o.   MRN: [id number] Spoke with [name] via telephone she has arranged a friend to take Anthony Hayes to [facility] around 3:00 pm today. Pt is aware and will work on getting him over to facility.

## 2018-04-23 ENCOUNTER — Inpatient Hospital Stay (HOSPITAL_COMMUNITY): Payer: Medicare Other | Admitting: Physical Therapy

## 2018-04-27 ENCOUNTER — Telehealth: Payer: Self-pay | Admitting: Registered Nurse

## 2018-04-27 NOTE — Telephone Encounter (Signed)
This provider placed a call to Ms. Anthony Hayes daughter of ( [name]), hospital follow up scheduled. [name] is living at the [facility].

## 2018-05-03 ENCOUNTER — Encounter: Payer: No Typology Code available for payment source | Attending: Registered Nurse | Admitting: Registered Nurse

## 2018-05-03 ENCOUNTER — Encounter: Payer: Self-pay | Admitting: Registered Nurse

## 2018-05-03 VITALS — BP 148/85 | HR 67 | Ht 65.0 in | Wt 162.0 lb

## 2018-05-03 DIAGNOSIS — I10 Essential (primary) hypertension: Secondary | ICD-10-CM | POA: Diagnosis not present

## 2018-05-03 DIAGNOSIS — F0390 Unspecified dementia without behavioral disturbance: Secondary | ICD-10-CM | POA: Insufficient documentation

## 2018-05-03 DIAGNOSIS — E785 Hyperlipidemia, unspecified: Secondary | ICD-10-CM | POA: Insufficient documentation

## 2018-05-03 DIAGNOSIS — Z87891 Personal history of nicotine dependence: Secondary | ICD-10-CM | POA: Insufficient documentation

## 2018-05-03 DIAGNOSIS — Z961 Presence of intraocular lens: Secondary | ICD-10-CM | POA: Diagnosis not present

## 2018-05-03 DIAGNOSIS — E7849 Other hyperlipidemia: Secondary | ICD-10-CM | POA: Diagnosis not present

## 2018-05-03 DIAGNOSIS — E039 Hypothyroidism, unspecified: Secondary | ICD-10-CM | POA: Diagnosis not present

## 2018-05-03 DIAGNOSIS — Z9841 Cataract extraction status, right eye: Secondary | ICD-10-CM | POA: Insufficient documentation

## 2018-05-03 DIAGNOSIS — Z79899 Other long term (current) drug therapy: Secondary | ICD-10-CM | POA: Insufficient documentation

## 2018-05-03 DIAGNOSIS — I63311 Cerebral infarction due to thrombosis of right middle cerebral artery: Secondary | ICD-10-CM | POA: Diagnosis not present

## 2018-05-03 DIAGNOSIS — I6930 Unspecified sequelae of cerebral infarction: Secondary | ICD-10-CM | POA: Diagnosis not present

## 2018-05-03 DIAGNOSIS — Z981 Arthrodesis status: Secondary | ICD-10-CM | POA: Diagnosis not present

## 2018-05-03 DIAGNOSIS — K5090 Crohn's disease, unspecified, without complications: Secondary | ICD-10-CM | POA: Insufficient documentation

## 2018-05-03 DIAGNOSIS — I639 Cerebral infarction, unspecified: Secondary | ICD-10-CM | POA: Diagnosis not present

## 2018-05-03 DIAGNOSIS — Z9842 Cataract extraction status, left eye: Secondary | ICD-10-CM | POA: Diagnosis not present

## 2018-05-03 NOTE — Progress Notes (Signed)
Subjective:    Patient ID: Anthony Hayes, male    DOB: 07-01-1935, 83 y.o.   MRN: [id number]  HPI: Anthony Hayes is a 83 year old male who returns for hospital follow up appointment of his infarction of right basal ganglia, hypertension, hypothyroidism and hyperlipidemia. He was discharged to [facility] receiving therapies.  He denies any pain. Appetite fair he reports due to SNF food. He's currently living at [facility]. His daughter states hopefully he will be discharged to his home he is a resident of [place].   Anthony Hayes daughter is a Electrical engineer ( was on the phone) and voiced concerns about her father's B12, she believes he would benefit from injection verses po, due to his multiple colon resections due to Crohn's Disease. She was instructed to follow up with his PCP and to discuss with neurology, she verbalizes understanding.    Pain Inventory Average Pain 0 Pain Right Now 0 My pain is n/a  In the last 24 hours, has pain interfered with the following? General activity 0 Relation with others 0 Enjoyment of life 0 What TIME of day is your pain at its worst? n/a Sleep (in general) Fair  Pain is worse with: n/a Pain improves with: n/a Relief from Meds: 0  Mobility walk with assistance use a walker do you drive?  no transfers alone Do you have any goals in this area?  yes  Function retired I need assistance with the following:  toileting, meal prep and household duties  Neuro/Psych bladder control problems bowel control problems trouble walking confusion  Prior Studies Any changes since last visit?  no  Physicians involved in your care Primary care Dr. Cristy Folks   No family history on file. Social History   Socioeconomic History  . Marital status: Widowed    Spouse name: Not on file  . Number of children: Not on file  . Years of education: Not on file  . Highest education level: Not on file  Occupational History    . Not on file  Social Needs  . Financial resource strain: Not on file  . Food insecurity:    Worry: Not on file    Inability: Not on file  . Transportation needs:    Medical: Not on file    Non-medical: Not on file  Tobacco Use  . Smoking status: Former Smoker    Years: 7.00    Types: Cigarettes    Last attempt to quit: 1963    Years since quitting: 56.3  . Smokeless tobacco: Never Used  Substance and Sexual Activity  . Alcohol use: Yes    Comment: 10/15/2017 "a few drinks/year"  . Drug use: No  . Sexual activity: Not on file  Lifestyle  . Physical activity:    Days per week: Not on file    Minutes per session: Not on file  . Stress: Not on file  Relationships  . Social connections:    Talks on phone: Not on file    Gets together: Not on file    Attends religious service: Not on file    Active member of club or organization: Not on file    Attends meetings of clubs or organizations: Not on file    Relationship status: Not on file  Other Topics Concern  . Not on file  Social History Narrative  . Not on file   Past Surgical History:  Procedure Laterality Date  . ABDOMINAL EXPLORATION SURGERY  7/  . APPENDECTOMY    . CATARACT EXTRACTION W/ INTRAOCULAR LENS  IMPLANT, BILATERAL Bilateral   . COLECTOMY  1950s   "thought he had iteilis"  . ESOPHAGOGASTRODUODENOSCOPY N/A 10/22/2017   Procedure: ESOPHAGOGASTRODUODENOSCOPY (EGD);  Surgeon: Gatha Mayer, MD;  Location: [facility] ENDOSCOPY;  Service: Endoscopy;  Laterality: N/A;  . ESOPHAGOGASTRODUODENOSCOPY (EGD) WITH PROPOFOL N/A 10/16/2017   Procedure: ESOPHAGOGASTRODUODENOSCOPY (EGD) WITH PROPOFOL;  Surgeon: Milus Banister, MD;  Location[facility] ENDOSCOPY;  Service: Endoscopy;  Laterality: N/A;  . FEMUR FRACTURE SURGERY Right 1950s  . FRACTURE SURGERY    . KNEE ARTHROSCOPY Left   . POSTERIOR LUMBAR FUSION    . ROTATOR CUFF REPAIR Left   . TONSILLECTOMY     Past Medical History:  Diagnosis Date  . Arthritis    "joints"  (10/15/2017)  . Benign prostatic hyperplasia   . Complication of anesthesia    "last OR in 06/2017 affected him cognitively; hasn't got back to baseline yet" (10/15/2017)  . Dementia   . Depression   . GERD (gastroesophageal reflux disease)   . High cholesterol   . Hypertension   . Hypothyroidism   . Renal disease   . Renal disorder   . TIA (transient ischemic attack) early 2000s   BP (!) 148/85   Pulse 67   Ht 5' 5"  (1.651 m) Comment: pt reported  Wt 162 lb (73.5 kg) Comment: pt reported, in wheelchair, trouble standing  SpO2 96%   BMI 26.96 kg/m   Opioid Risk Score:   Fall Risk Score:  `1  Depression screen PHQ 2/9  No flowsheet data found. Review of Systems  Constitutional: Negative.   HENT: Negative.   Eyes: Negative.   Respiratory: Negative.   Cardiovascular: Negative.   Gastrointestinal: Negative.   Endocrine: Negative.   Genitourinary: Negative.   Musculoskeletal: Negative.   Skin: Negative.   Allergic/Immunologic: Negative.   Neurological: Negative.   Hematological: Negative.   Psychiatric/Behavioral: Negative.   All other systems reviewed and are negative.      Objective:   Physical Exam  Constitutional: He is oriented to person, place, and time. He appears well-developed and well-nourished.  HENT:  Head: Normocephalic and atraumatic.  Neck: Normal range of motion. Neck supple.  Cardiovascular: Normal rate and regular rhythm.  Pulmonary/Chest: Effort normal and breath sounds normal.  Musculoskeletal:  Normal Muscle Bulk and Muscle Testing Reveals: Upper Extremities: Decreased ROM 90 Degrees and Muscle Strength 4/5 Lower Extremities: Full ROM and Muscle Strength 5/5 Arises from cadillac walker with ease Gait is steady with Normal Steps  Neurological: He is alert and oriented to person, place, and time.  Skin: Skin is warm and dry.  Psychiatric: He has a normal mood and affect.  Nursing note and vitals reviewed.         Assessment & Plan:    1. Infarction of Right Basal Ganglia: Continue with Therapies. Continue to Monitor. 2. Hypertension: Continue current medication regimen. PCP Following 3. Hypothyroidism: Continue current medication regimen: PCP Following 4. Hyperlipidemia: Continue current medication regimen. PCP Following.   30 minutes of face to face patient care time was spent during this visit. All questions was encouraged and answered.   F/U in 4- 6 weeks with Dr. Letta Pate.

## 2018-05-18 ENCOUNTER — Ambulatory Visit: Payer: Medicare Other | Admitting: Adult Health

## 2018-05-31 ENCOUNTER — Ambulatory Visit: Payer: Medicare Other | Admitting: Physical Medicine & Rehabilitation

## 2018-05-31 ENCOUNTER — Encounter: Payer: Medicare Other | Attending: Registered Nurse

## 2018-05-31 DIAGNOSIS — I6930 Unspecified sequelae of cerebral infarction: Secondary | ICD-10-CM | POA: Insufficient documentation

## 2018-05-31 DIAGNOSIS — E039 Hypothyroidism, unspecified: Secondary | ICD-10-CM | POA: Insufficient documentation

## 2018-05-31 DIAGNOSIS — K5090 Crohn's disease, unspecified, without complications: Secondary | ICD-10-CM | POA: Insufficient documentation

## 2018-05-31 DIAGNOSIS — Z9841 Cataract extraction status, right eye: Secondary | ICD-10-CM | POA: Insufficient documentation

## 2018-05-31 DIAGNOSIS — Z981 Arthrodesis status: Secondary | ICD-10-CM | POA: Insufficient documentation

## 2018-05-31 DIAGNOSIS — E785 Hyperlipidemia, unspecified: Secondary | ICD-10-CM | POA: Insufficient documentation

## 2018-05-31 DIAGNOSIS — Z961 Presence of intraocular lens: Secondary | ICD-10-CM | POA: Insufficient documentation

## 2018-05-31 DIAGNOSIS — Z87891 Personal history of nicotine dependence: Secondary | ICD-10-CM | POA: Insufficient documentation

## 2018-05-31 DIAGNOSIS — I10 Essential (primary) hypertension: Secondary | ICD-10-CM | POA: Insufficient documentation

## 2018-05-31 DIAGNOSIS — Z79899 Other long term (current) drug therapy: Secondary | ICD-10-CM | POA: Insufficient documentation

## 2018-05-31 DIAGNOSIS — F0390 Unspecified dementia without behavioral disturbance: Secondary | ICD-10-CM | POA: Insufficient documentation

## 2018-05-31 DIAGNOSIS — Z9842 Cataract extraction status, left eye: Secondary | ICD-10-CM | POA: Insufficient documentation

## 2018-06-06 ENCOUNTER — Ambulatory Visit (INDEPENDENT_AMBULATORY_CARE_PROVIDER_SITE_OTHER): Payer: Medicare Other | Admitting: Adult Health

## 2018-06-06 ENCOUNTER — Encounter: Payer: Self-pay | Admitting: Adult Health

## 2018-06-06 VITALS — BP 147/69 | HR 55 | Ht 66.0 in | Wt 172.6 lb

## 2018-06-06 DIAGNOSIS — E785 Hyperlipidemia, unspecified: Secondary | ICD-10-CM

## 2018-06-06 DIAGNOSIS — I10 Essential (primary) hypertension: Secondary | ICD-10-CM

## 2018-06-06 DIAGNOSIS — F0390 Unspecified dementia without behavioral disturbance: Secondary | ICD-10-CM | POA: Diagnosis not present

## 2018-06-06 DIAGNOSIS — I639 Cerebral infarction, unspecified: Secondary | ICD-10-CM | POA: Diagnosis not present

## 2018-06-06 MED ORDER — PRAVASTATIN SODIUM 10 MG PO TABS: 10.0000 mg | ORAL_TABLET | Freq: Every day | ORAL | 4 refills | Status: DC

## 2018-06-06 NOTE — Patient Instructions (Addendum)
Continue clopidogrel 75 mg daily  and restart Pravachol 34m  for secondary stroke prevention   Stop aspirin at this time as you have completed more than 3 weeks of dual antiplatelet therapy  Continue to follow up with PCP regarding cholesterol and blood pressure management   [place]  Do mind exercises such as card games, cross word puzzles and word searches  Maintain strict control of hypertension with blood pressure goal below 130/90, diabetes with hemoglobin A1c goal below 6.5% and cholesterol with LDL cholesterol (bad cholesterol) goal below 70 mg/dL. I also advised the patient to eat a healthy diet with plenty of whole grains, cereals, fruits and vegetables, exercise regularly and maintain ideal body weight.  Followup in the future with me in 3 months or call earlier if needed       [facility]. I hope we have been able to provide you high quality care today.  You may receive a patient satisfaction survey over the next few weeks. We would appreciate your feedback and comments so that we may continue to improve ourselves and the health of our patients.

## 2018-06-06 NOTE — Progress Notes (Signed)
[facility] [address] [place] [address] [phone]       OFFICE FOLLOW UP NOTE  [name] Date of Birth:  [birth year] Medical Record Number:  [id number]   Reason for Referral:  hospital stroke follow up  CHIEF COMPLAINT:  Chief Complaint  Patient presents with  . New Patient (Initial Visit)    hospital follow up. Patient here with daughter. Pt reports he is doing well.     HPI: Anthony Hayes is being seen today for initial visit in the office for right basal ganglia infarct on 04/03/18. History obtained from patient and chart review. Reviewed all radiology images and labs personally.  [name] is a 83 y.o. L handed white male with history of hypertension, hyperlipidemia, TIA, BPH, GERD, chronic kidney disease stage III, hypothyroidism, Crohn's disease status post colectomy as a teenager, suspected peripheral neuropathy, and former smoker who presented after waking with left hemiparesis and difficulty walking.  CT head reviewed and showed small vessel disease but no acute abnormality.  MRI head reviewed and showed right basal ganglia white matter infarct along with extensive small vessel disease.  Repeat CT head on 04/05/2018 showed expected evolution of right BG infarct.  Right carotid Doppler showed bilateral ICA stenosis of 1 to 39% with VAs antegrade.  TCD did show diffuse intracranial arthrosclerosis giving globally elevated pulsatility.  2D echo showed an EF of 55 to 60% without cardiac source of embolus.  EEG showed mild to moderate diffuse background slowing.  LDL 70 and as patient was not on statin PTA was recommended to start Pravachol 10 mg daily.  Patient was on aspirin 81 mg daily PTA and recommended DAPT for 3 weeks with aspirin and Plavix and then continue Plavix alone.  PT/OT recommended CIR.  Patient is being seen today for hospital follow-up and is accompanied by his daughter Anthony Hayes.  He currently is living at [facility] assisted living  facility.  Prior to returning to [facility], he was discharged to [facility] after [facility]  He continues PT/OT at [facility] where he continues to make progress.  He has mild left hemiparesis along with mild left hand dexterity.  Has complaints of difficulty writing with left hand but this is improving.  Patient did have history of dementia prior to his stroke and per daughter, his memory has worsened since his stroke.  He was continued on both aspirin and Plavix but denies bleeding or bruising.  Prep the child was not listed as a medication on [facility] and daughter is unsure why.  Blood pressure mildly elevated at 147/67.  Patient remains active at assisted living facility with attending weekly field trips and playing trivia night nightly.  Daughter states in the past patient was being supplemented for B12 deficiency and was wondering if this is something that our office could treat him for.  Daughter was advised that she should speak to PCP or provider at [facility] for them to manage this.  Patient denies new or worsening stroke/TIA symptoms.  ROS:   14 system review of systems performed and negative with exception of swelling in legs, ringing in ears, incontinence, diarrhea, easy bruising, memory loss  PMH:  Past Medical History:  Diagnosis Date  . Arthritis    "joints" (10/15/2017)  . Benign prostatic hyperplasia   . Complication of anesthesia    "last OR in 06/2017 affected him cognitively; hasn't got back to baseline yet" (10/15/2017)  . Dementia   .  Depression   . GERD (gastroesophageal reflux disease)   . High cholesterol   . Hypertension   . Hypothyroidism   . Renal disease   . Renal disorder   . TIA (transient ischemic attack) early 2000s    PSH:  Past Surgical History:  Procedure Laterality Date  . ABDOMINAL EXPLORATION SURGERY  7/  . APPENDECTOMY    . CATARACT EXTRACTION W/ INTRAOCULAR LENS  IMPLANT, BILATERAL Bilateral   . COLECTOMY  1950s   "thought he  had iteilis"  . ESOPHAGOGASTRODUODENOSCOPY N/A 10/22/2017   Procedure: ESOPHAGOGASTRODUODENOSCOPY (EGD);  Surgeon: Gatha Mayer, MD;  Location: [facility] ENDOSCOPY;  Service: Endoscopy;  Laterality: N/A;  . ESOPHAGOGASTRODUODENOSCOPY (EGD) WITH PROPOFOL N/A 10/16/2017   Procedure: ESOPHAGOGASTRODUODENOSCOPY (EGD) WITH PROPOFOL;  Surgeon: Milus Banister, MD;  Location: [facility] ENDOSCOPY;  Service: Endoscopy;  Laterality: N/A;  . FEMUR FRACTURE SURGERY Right 1950s  . FRACTURE SURGERY    . KNEE ARTHROSCOPY Left   . POSTERIOR LUMBAR FUSION    . ROTATOR CUFF REPAIR Left   . TONSILLECTOMY      Social History:  Social History   Socioeconomic History  . Marital status: Widowed    Spouse name: Not on file  . Number of children: Not on file  . Years of education: Not on file  . Highest education level: Not on file  Occupational History  . Not on file  Social Needs  . Financial resource strain: Not on file  . Food insecurity:    Worry: Not on file    Inability: Not on file  . Transportation needs:    Medical: Not on file    Non-medical: Not on file  Tobacco Use  . Smoking status: Former Smoker    Years: 7.00    Types: Cigarettes    Last attempt to quit: 1963    Years since quitting: 56.4  . Smokeless tobacco: Never Used  Substance and Sexual Activity  . Alcohol use: Yes    Comment: 10/15/2017 "a few drinks/year"  . Drug use: No  . Sexual activity: Not on file  Lifestyle  . Physical activity:    Days per week: Not on file    Minutes per session: Not on file  . Stress: Not on file  Relationships  . Social connections:    Talks on phone: Not on file    Gets together: Not on file    Attends religious service: Not on file    Active member of club or organization: Not on file    Attends meetings of clubs or organizations: Not on file    Relationship status: Not on file  . Intimate partner violence:    Fear of current or ex partner: Not on file    Emotionally abused: Not on file     Physically abused: Not on file    Forced sexual activity: Not on file  Other Topics Concern  . Not on file  Social History Narrative  . Not on file    Family History: No family history on file.  Medications:   Current Outpatient Medications on File Prior to Visit  Medication Sig Dispense Refill  . cetirizine (ZYRTEC) 10 MG tablet Take 10 mg by mouth daily.    . clopidogrel (PLAVIX) 75 MG tablet Take 1 tablet (75 mg total) by mouth daily. 30 tablet 0  . COMBIVENT RESPIMAT 20-100 MCG/ACT AERS respimat     . ipratropium (ATROVENT) 0.06 % nasal spray Place 2 sprays into both nostrils 4 (four)  times daily.    Marland Kitchen levothyroxine (SYNTHROID, LEVOTHROID) 50 MCG tablet Take 1 tablet (50 mcg total) by mouth every morning. 50 tablet 0  . oxybutynin (DITROPAN) 5 MG tablet Take 5 mg by mouth every morning.    . pantoprazole (PROTONIX) 40 MG tablet Take 1 tablet (40 mg total) by mouth daily. 30 tablet 0  . sertraline (ZOLOFT) 50 MG tablet     . tamsulosin (FLOMAX) 0.4 MG CAPS capsule Take 1 capsule (0.4 mg total) by mouth daily. 14 capsule 0  . vitamin B-12 (CYANOCOBALAMIN) 500 MCG tablet Take 1 tablet (500 mcg total) by mouth daily.     No current facility-administered medications on file prior to visit.     Allergies:   Allergies  Allergen Reactions  . Lisinopril Swelling    Swelling of lips      Physical Exam  Vitals:   06/06/18 0907  BP: (!) 147/69  Pulse: (!) 55  Weight: 172 lb 9.6 oz (78.3 kg)  Height: 5' 6"  (1.676 m)   Body mass index is 27.86 kg/m. No exam data present  General: well developed, pleasant elderly Caucasian male, well nourished, seated, in no evident distress Head: head normocephalic and atraumatic.   Neck: supple with no carotid or supraclavicular bruits Cardiovascular: regular rate and rhythm, no murmurs Musculoskeletal: no deformity Skin:  no rash/petichiae Vascular:  Normal pulses all extremities  Neurologic Exam Mental Status: Awake and fully alert.  Oriented to place and time. Remote memory intact. Attention span, concentration and fund of knowledge appropriate. Mood and affect appropriate.  AFT 9.  Clock drawing 4/4.  Recall 0/3.  Able to do serial additions. Cranial Nerves: Fundoscopic exam reveals sharp disc margins. Pupils equal, briskly reactive to light. Extraocular movements full without nystagmus. Visual fields full to confrontation. Hearing intact. Facial sensation intact. Face, tongue, palate moves normally and symmetrically.  Motor: Normal bulk and tone. Normal strength in all tested extremity muscles.  Mild left grip weakness. Sensory.: intact to touch , pinprick , position and vibratory sensation.  Coordination: Rapid alternating movements normal in all extremities. Finger-to-nose and heel-to-shin performed accurately bilaterally.  Decreased left hand dexterity. Gait and Station: Arises from chair with mild difficulty but able to do independently.  Stance is hunched.  Patient does use rolling walker to assist with ambulation. Reflexes: 1+ and symmetric. Toes downgoing.    NIHSS  0 Modified Rankin  2   Diagnostic Data (Labs, Imaging, Testing)  Ct Head Wo Contrast 04/03/2018 1. No acute intracranial abnormalities. 2. Atrophy and chronic microvascular ischemic change. Stable appearance from the prior study.  04/05/2018 1. Expected evolution acute/subacute infarcts involving the right corona radiata and basal ganglia. 2. No hemorrhage or other acute abnormality. 3. Stable diffuse white matter disease.  Mr Brain Wo Contrast (neuro Protocol) 04/03/2018 Small areas of acute infarction affecting the posterior right basal ganglia and radiating white matter tracts on the right. No swelling or hemorrhage. Extensive chronic small-vessel ischemic changes elsewhere throughout the brain as outlined above.   CT head Wo contrast 04/05/2018 IMPRESSION: 1. Expected evolution acute/subacute infarcts involving the right corona radiata and basal  ganglia. 2. No hemorrhage or other acute abnormality. 3. Stable diffuse white matter disease.  2D Echocardiogram  EF 55-60% with no source of embolus.   Carotid Doppler   There is 1-39% bilateral ICA stenosis. Vertebral artery flow is antegrade.    Transcranial Doppler Poor L temporal window.  Diffuse intracranial atherosclerosis.   EEG This awake andasleepEEG is abnormal due  to mild to moderatediffuse backgroundslowing.    ASSESSMENT: Matson Welch is a 83 y.o. year old male here with right basal ganglia infarct on 04/03/18 secondary to SVD. Vascular risk factors include HLD and HTN.   PLAN: -Continue clopidogrel 75 mg daily  and restart pravachol 4m for secondary stroke prevention -stop aspirin 881mas greater then 3 weeks of DAPT completed -F/u with PCP regarding your HLD and HTN management -continue to monitor BP at home -[facility] such as card games, crossword puzzles and word searches to help with memory -Consider MMSE at follow-up visit if continued worsening dementia -Maintain strict control of hypertension with blood pressure goal below 130/90, diabetes with hemoglobin A1c goal below 6.5% and cholesterol with LDL cholesterol (bad cholesterol) goal below 70 mg/dL. I also advised the patient to eat a healthy diet with plenty of whole grains, cereals, fruits and vegetables, exercise regularly and maintain ideal body weight.  Follow up in 3 months or call earlier if needed   Greater than 50% of time during this 25 minute visit was spent on counseling,explanation of diagnosis of R BG infarct, reviewing risk factor management of HLD and HTN, planning of further management, discussion with patient and family and coordination of care    JeVenancio PoissonAGBeltway Surgery Centers LLCGuFirst [facility] L[facility] [address]uMooresbororRondaNC [address]Phone [phone]

## 2018-06-06 NOTE — Progress Notes (Signed)
I agree with the above plan 

## 2018-07-18 ENCOUNTER — Telehealth: Payer: Self-pay | Admitting: Physician Assistant

## 2018-07-18 NOTE — Telephone Encounter (Signed)
Received records from [facility] [place] on 07/15/18, Appt 08/18/18 @ 1:30PM. NV

## 2018-08-04 ENCOUNTER — Emergency Department (HOSPITAL_COMMUNITY)
Admission: EM | Admit: 2018-08-04 | Discharge: 2018-08-04 | Disposition: A | Discharge status: Home / Self Care | Payer: Medicare Other | Attending: Emergency Medicine | Admitting: Emergency Medicine

## 2018-08-04 ENCOUNTER — Encounter (HOSPITAL_COMMUNITY): Payer: Self-pay

## 2018-08-04 ENCOUNTER — Emergency Department (HOSPITAL_COMMUNITY): Payer: Medicare Other

## 2018-08-04 ENCOUNTER — Other Ambulatory Visit: Payer: Self-pay

## 2018-08-04 DIAGNOSIS — F0390 Unspecified dementia without behavioral disturbance: Secondary | ICD-10-CM | POA: Diagnosis not present

## 2018-08-04 DIAGNOSIS — W01198A Fall on same level from slipping, tripping and stumbling with subsequent striking against other object, initial encounter: Secondary | ICD-10-CM | POA: Insufficient documentation

## 2018-08-04 DIAGNOSIS — Z87891 Personal history of nicotine dependence: Secondary | ICD-10-CM | POA: Insufficient documentation

## 2018-08-04 DIAGNOSIS — Y9389 Activity, other specified: Secondary | ICD-10-CM | POA: Insufficient documentation

## 2018-08-04 DIAGNOSIS — S0081XA Abrasion of other part of head, initial encounter: Secondary | ICD-10-CM | POA: Diagnosis not present

## 2018-08-04 DIAGNOSIS — Z8673 Personal history of transient ischemic attack (TIA), and cerebral infarction without residual deficits: Secondary | ICD-10-CM | POA: Diagnosis not present

## 2018-08-04 DIAGNOSIS — Y998 Other external cause status: Secondary | ICD-10-CM | POA: Insufficient documentation

## 2018-08-04 DIAGNOSIS — Y92129 Unspecified place in nursing home as the place of occurrence of the external cause: Secondary | ICD-10-CM | POA: Diagnosis not present

## 2018-08-04 DIAGNOSIS — E039 Hypothyroidism, unspecified: Secondary | ICD-10-CM | POA: Diagnosis not present

## 2018-08-04 DIAGNOSIS — S0990XA Unspecified injury of head, initial encounter: Secondary | ICD-10-CM | POA: Diagnosis present

## 2018-08-04 DIAGNOSIS — N183 Chronic kidney disease, stage 3 (moderate): Secondary | ICD-10-CM | POA: Diagnosis not present

## 2018-08-04 DIAGNOSIS — F329 Major depressive disorder, single episode, unspecified: Secondary | ICD-10-CM | POA: Insufficient documentation

## 2018-08-04 DIAGNOSIS — I129 Hypertensive chronic kidney disease with stage 1 through stage 4 chronic kidney disease, or unspecified chronic kidney disease: Secondary | ICD-10-CM | POA: Diagnosis not present

## 2018-08-04 DIAGNOSIS — Z79899 Other long term (current) drug therapy: Secondary | ICD-10-CM | POA: Diagnosis not present

## 2018-08-04 DIAGNOSIS — W19XXXA Unspecified fall, initial encounter: Secondary | ICD-10-CM

## 2018-08-04 DIAGNOSIS — Z7902 Long term (current) use of antithrombotics/antiplatelets: Secondary | ICD-10-CM | POA: Diagnosis not present

## 2018-08-04 DIAGNOSIS — Z23 Encounter for immunization: Secondary | ICD-10-CM | POA: Diagnosis not present

## 2018-08-04 MED ORDER — TETANUS-DIPHTH-ACELL PERTUSSIS 5-2.5-18.5 LF-MCG/0.5 IM SUSP
0.5000 mL | Freq: Once | INTRAMUSCULAR | Status: AC
  Administered 2018-08-04: 0.5 mL via INTRAMUSCULAR
  Filled 2018-08-04: qty 0.5

## 2018-08-04 NOTE — ED Triage Notes (Signed)
Pt arrived from assisted living at [facility]. Pt had unwitnessed fall today. Denies hitting head but has abrasion to right forehead. Denies LOC. Per daughter, pt uses [facility] but was not using it at time of fall

## 2018-08-04 NOTE — ED Provider Notes (Signed)
[facility] EMERGENCY DEPARTMENT Provider Note   CSN: [id number] Arrival date & time: 08/04/18  1950     History   Chief Complaint Chief Complaint  Patient presents with  . Fall    HPI Anthony Hayes is a 83 y.o. male.  83 yo M with a cc of a fall. Patient lost his balance and then had a controlled collapse to the floor. Struck his head, but denies other injury.  Denies neck pain back pain chest pain abdominal pain extremity pain.  Was ambulatory afterwards.  The history is provided by the patient.  Fall  This is a new problem. The current episode started 3 to 5 hours ago. The problem occurs constantly. The problem has not changed since onset.Associated symptoms include headaches. Pertinent negatives include no chest pain, no abdominal pain and no shortness of breath. Nothing aggravates the symptoms. Nothing relieves the symptoms. He has tried nothing for the symptoms. The treatment provided no relief.    Past Medical History:  Diagnosis Date  . Arthritis    "joints" (10/15/2017)  . Benign prostatic hyperplasia   . Complication of anesthesia    "last OR in 06/2017 affected him cognitively; hasn't got back to baseline yet" (10/15/2017)  . Dementia   . Depression   . GERD (gastroesophageal reflux disease)   . High cholesterol   . Hypertension   . Hypothyroidism   . Renal disease   . Renal disorder   . TIA (transient ischemic attack) early 2000s    Patient Active Problem List   Diagnosis Date Noted  . Sleep disturbance   . Stage 3 chronic kidney disease ([place])   . Benign essential HTN   . Hypokalemia   . Hyponatremia   . Infarction of right basal ganglia ([place]) 04/06/2018  . Pressure injury of skin 04/06/2018  . Vitamin B12 deficiency   . Hypothyroidism   . Crohn's disease with complication ([place])   . Benign prostatic hyperplasia   . Acute lower UTI   . Dementia without behavioral disturbance   . Acute ischemic stroke ([place]) 04/05/2018  . Leukocytosis    . Shortness of breath   . Acute encephalopathy   . CVA (cerebral vascular accident) ([place]) 04/03/2018  . Calculus, kidney 01/21/2018  . Acute blood loss anemia   . Mallory-Weiss syndrome   . Esophageal stricture   . Respiratory failure ([place])   . UGIB (upper gastrointestinal bleed) 10/16/2017  . GERD (gastroesophageal reflux disease) 10/16/2017  . Essential hypertension, benign 10/16/2017  . Chest pain 10/15/2017  . Hypothyroidism (acquired) 06/08/2017  . Chronic kidney disease, stage III (moderate) ([place]) 03/19/2016  . Dyslipidemia 03/19/2016  . Degeneration of lumbar or lumbosacral intervertebral disc 07/10/2008  . Arthropathy, lower leg 05/23/2004    Past Surgical History:  Procedure Laterality Date  . ABDOMINAL EXPLORATION SURGERY  7/  . APPENDECTOMY    . CATARACT EXTRACTION W/ INTRAOCULAR LENS  IMPLANT, BILATERAL Bilateral   . COLECTOMY  1950s   "thought he had iteilis"  . ESOPHAGOGASTRODUODENOSCOPY N/A 10/22/2017   Procedure: ESOPHAGOGASTRODUODENOSCOPY (EGD);  Surgeon: Gatha Mayer, MD;  Location: [facility] ENDOSCOPY;  Service: Endoscopy;  Laterality: N/A;  . ESOPHAGOGASTRODUODENOSCOPY (EGD) WITH PROPOFOL N/A 10/16/2017   Procedure: ESOPHAGOGASTRODUODENOSCOPY (EGD) WITH PROPOFOL;  Surgeon: Milus Banister, MD;  [facility] END[facility]COPY;  Service: Endoscopy;  Laterality: N/A;  . FEMUR FRACTURE SURGERY Right 1950s  . FRACTURE SURGERY    . KNEE ARTHROSCOPY Left   . POSTERIOR LUMBAR FUSION    .  ROTATOR CUFF REPAIR Left   . TONSILLECTOMY          Home Medications    Prior to Admission medications   Medication Sig Start Date End Date Taking? Authorizing Provider  cetirizine (ZYRTEC) 10 MG tablet Take 10 mg by mouth daily.    [provider]  clopidogrel (PLAVIX) 75 MG tablet Take 1 tablet (75 mg total) by mouth daily. 04/06/18   Dhungel, Flonnie Overman, MD  COMBIVENT RESPIMAT 20-100 MCG/ACT AERS respimat  06/01/18   [provider]  ipratropium (ATROVENT) 0.06 %  nasal spray Place 2 sprays into both nostrils 4 (four) times daily.    [provider]  levothyroxine (SYNTHROID, LEVOTHROID) 50 MCG tablet Take 1 tablet (50 mcg total) by mouth every morning. 04/05/18   Dhungel, Nishant, MD  oxybutynin (DITROPAN) 5 MG tablet Take 5 mg by mouth every morning. 10/05/17   [provider]  pantoprazole (PROTONIX) 40 MG tablet Take 1 tablet (40 mg total) by mouth daily. 04/05/18   Dhungel, Flonnie Overman, MD  pravastatin (PRAVACHOL) 10 MG tablet Take 1 tablet (10 mg total) by mouth daily. 06/06/18   Venancio Poisson, NP  sertraline (ZOLOFT) 50 MG tablet  05/25/18   [provider]  tamsulosin (FLOMAX) 0.4 MG CAPS capsule Take 1 capsule (0.4 mg total) by mouth daily. 01/21/18   Barnet Glasgow, NP  vitamin B-12 (CYANOCOBALAMIN) 500 MCG tablet Take 1 tablet (500 mcg total) by mouth daily. 04/21/18   Bary Leriche, PA-C    Family History History reviewed. No pertinent family history.  Social History Social History   Tobacco Use  . Smoking status: Former Smoker    Years: 7.00    Types: Cigarettes    Last attempt to quit: 1963    Years since quitting: 56.6  . Smokeless tobacco: Never Used  Substance Use Topics  . Alcohol use: Yes    Comment: 10/15/2017 "a few drinks/year"  . Drug use: No     Allergies   Lisinopril   Review of Systems Review of Systems  Constitutional: Negative for chills and fever.  HENT: Negative for congestion and facial swelling.   Eyes: Negative for discharge and visual disturbance.  Respiratory: Negative for shortness of breath.   Cardiovascular: Negative for chest pain and palpitations.  Gastrointestinal: Negative for abdominal pain, diarrhea and vomiting.  Musculoskeletal: Negative for arthralgias and myalgias.  Skin: Negative for color change and rash.  Neurological: Positive for headaches. Negative for tremors and syncope.  Psychiatric/Behavioral: Negative for confusion and dysphoric mood.      Physical Exam Updated Vital Signs BP (!) 162/70   Pulse (!) 106   Temp 98.7 F (37.1 C) (Oral)   Resp 16   Ht 5' 6"  (1.676 m)   Wt 77.1 kg   SpO2 94%   BMI 27.44 kg/m   Physical Exam  Constitutional: He is oriented to person, place, and time. He appears well-developed and well-nourished.  HENT:  Head: Normocephalic.  Abrasion to the right frontal region.  Extraocular motor intact.  No diplopia.  Eyes: Pupils are equal, round, and reactive to light. EOM are normal.  Neck: Normal range of motion. Neck supple. No JVD present.  Cardiovascular: Normal rate and regular rhythm. Exam reveals no gallop and no friction rub.  No murmur heard. Pulmonary/Chest: No respiratory distress. He has no wheezes.  Abdominal: He exhibits no distension. There is no rebound and no guarding.  Musculoskeletal: Normal range of motion.  Neurological: He is alert and oriented to  person, place, and time.  Skin: No rash noted. No pallor.  Psychiatric: He has a normal mood and affect. His behavior is normal.  Nursing note and vitals reviewed.    ED Treatments / Results  Labs (all labs ordered are listed, but only abnormal results are displayed) Labs Reviewed - No data to display  EKG None  Radiology Ct Head Wo Contrast  Result Date: 08/04/2018 CLINICAL DATA:  Status post unwitnessed fall, with abrasion at the right forehead. Initial encounter. EXAM: CT HEAD WITHOUT CONTRAST TECHNIQUE: Contiguous axial images were obtained from the base of the skull through the vertex without intravenous contrast. COMPARISON:  CT of the head performed 04/05/2018 FINDINGS: Brain: No evidence of acute infarction, hemorrhage, hydrocephalus, extra-axial collection or mass lesion / mass effect. Prominence of the ventricles and sulci reflects moderate cortical volume loss. Mild cerebellar atrophy is noted. Diffuse periventricular subcortical white matter change likely reflects small vessel ischemic microangiopathy. Chronic  ischemic change is noted at the basal ganglia and thalami bilaterally. The brainstem and fourth ventricle are within normal limits. The cerebral hemispheres demonstrate grossly normal gray-white differentiation. No mass effect or midline shift is seen. Vascular: No hyperdense vessel or unexpected calcification. Skull: There is no evidence of fracture; visualized osseous structures are unremarkable in appearance. Sinuses/Orbits: The orbits are within normal limits. The paranasal sinuses and mastoid air cells are well-aerated. Other: Mild soft tissue swelling is noted overlying the right frontal calvarium. IMPRESSION: 1. No evidence of traumatic intracranial injury or fracture. 2. Mild soft tissue swelling overlying the right frontal calvarium. 3. Moderate cortical volume loss and diffuse small vessel ischemic microangiopathy. 4. Chronic ischemic change at the basal ganglia and thalami bilaterally. Electronically Signed   By: Garald Balding M.D.   On: 08/04/2018 21:51    Procedures Procedures (including critical care time)  Medications Ordered in ED Medications  Tdap (BOOSTRIX) injection 0.5 mL (0.5 mLs Intramuscular Given 08/04/18 2136)     Initial Impression / Assessment and Plan / ED Course  I have reviewed the triage vital signs and the nursing notes.  Pertinent labs & imaging results that were available during my care of the patient were reviewed by me and considered in my medical decision making (see chart for details).     83 yo M with a chief complaint of fall.  Mechanical in nature.  His immunizations were looked up and his last one was done in July 2009 we will update today.  CT of the head. Ct negative. D/c home.   10:19 PM:  I have discussed the diagnosis/risks/treatment options with the patient and family and believe the pt to be eligible for discharge home to follow-up with PCP. We also discussed returning to the ED immediately if new or worsening sx occur. We discussed the sx which  are most concerning (e.g., sudden worsening headache, vomiting, unilateral weakness. ) that necessitate immediate return. Medications administered to the patient during their visit and any new prescriptions provided to the patient are listed below.  Medications given during this visit Medications  Tdap (BOOSTRIX) injection 0.5 mL (0.5 mLs Intramuscular Given 08/04/18 2136)      The patient appears reasonably screen and/or stabilized for discharge and I doubt any other medical condition or other [facility] requiring further screening, evaluation, or treatment in the ED at this time prior to discharge.    Final Clinical Impressions(s) / ED Diagnoses   Final diagnoses:  Fall, initial encounter  Abrasion, face w/o infection    ED Discharge  Orders    None       Deno Etienne, DO 08/04/18 2219

## 2018-08-04 NOTE — Discharge Instructions (Signed)
Follow up with your PCP. Return to the ED for confusion, sudden worsening headache.

## 2018-08-18 ENCOUNTER — Telehealth: Payer: Self-pay

## 2018-08-18 ENCOUNTER — Ambulatory Visit: Payer: Medicare Other | Admitting: Physician Assistant

## 2018-08-18 NOTE — Telephone Encounter (Signed)
Requested speak to the charge nurse for Anthony Hayes, receptionist took a message and nurse was passing morning meds. Left my name and number advised the nurse to contact me once she has a free.

## 2018-08-18 NOTE — Telephone Encounter (Signed)
Spoke with nurse at [facility] and she stated she would send the ekg and last ov notes with patient to appointment. Advised for patient to arrive early for Angie to review EKG and last office notes and to get patient ready to see provider. Nurse voiced understanding.

## 2018-09-06 ENCOUNTER — Ambulatory Visit: Payer: Medicare Other | Admitting: Adult Health

## 2018-09-07 NOTE — Progress Notes (Signed)
Cardiology Office Note:    Date:  09/08/2018   ID:  Anthony Hayes, DOB 1935-03-04, MRN [id number]  PCP:  Etter Sjogren, DO  Cardiologist:  Minus Breeding, MD   Referring MD: Leontine Locket, MD   Chief Complaint  Patient presents with  . Irregular Heart Beat    History of Present Illness:    Anthony Hayes is a 83 y.o. male with a hx of hypertension, CVA, CKD stage III, hyperlipidemia, Crohn's disease status post multiple colon resections.  Patient has mild dementia and resides at [facility] assisted [facility] facility.  He was noted to have an acute ischemic CVA in April 2019. He was seen in follow-up on 06/06/2018 with outpatient neurology, and continued on Plavix.  He completed 3 weeks of dual antiplatelet therapy following his stroke and therefore aspirin 81 mg was stopped.  He recently presented to the ER on 08/04/2018 following a fall.  He struck his head but denied other injuries.  Head CT was negative for acute abnormality.  He was discharged back to the facility without further intervention.    He presents today as a new patient evaluation for bradycardia. EKG review in Epic reveals baseline sinus bradycardia with RBBB. TSH on 04/04/18 was 4.601, slightly above normal. On my interview, he presents with his daughter. After his stroke in April, he has had three falls - June, July, and August. No injuries. When paramedics arrived after the fall in July, EKG strips showed abnormal heart rhythm. No EKG in Epic from July. He denies palpitations and chest pain. Daughter states that since his stroke, he has had a decline in health, including decreased stamina and increased DOE. He wheezes frequently and is told he has COPD. He states breathing treatments at the SNF do not improve his respiratory status. His daughter questions heart failure. He also complains of left lower extremity swelling that has improved over the past few weeks.   He had a stress test in 2017 that showed a fixed inferior defect  without ischemia. Echo in 03/2018 with normal EF and grade 1 DD, no wall motion abnormality.    Past Medical History:  Diagnosis Date  . Arthritis    "joints" (10/15/2017)  . Benign prostatic hyperplasia   . Complication of anesthesia    "last OR in 06/2017 affected him cognitively; hasn't got back to baseline yet" (10/15/2017)  . Dementia   . Depression   . GERD (gastroesophageal reflux disease)   . High cholesterol   . Hypertension   . Hypothyroidism   . Renal disease   . Renal disorder   . TIA (transient ischemic attack) early 2000s    Past Surgical History:  Procedure Laterality Date  . ABDOMINAL EXPLORATION SURGERY  7/  . APPENDECTOMY    . CATARACT EXTRACTION W/ INTRAOCULAR LENS  IMPLANT, BILATERAL Bilateral   . COLECTOMY  1950s   "thought he had iteilis"  . ESOPHAGOGASTRODUODENOSCOPY N/A 10/22/2017   Procedure: ESOPHAGOGASTRODUODENOSCOPY (EGD);  Surgeon: Gatha Mayer, MD;  Location: [facility] ENDOSCOPY;  Service: Endoscopy;  Laterality: N/A;  . ESOPHAGOGASTRODUODENOSCOPY (EGD) WITH PROPOFOL N/A 10/16/2017   Procedure: ESOPHAGOGASTRODUODENOSCOPY (EGD) WITH PROPOFOL;  Surgeon: Milus Banister, MD;  Location: [facility] ENDOSCOPY;  Service: Endoscopy;  Laterality: N/A;  . FEMUR FRACTURE SURGERY Right 1950s  . FRACTURE SURGERY    . KNEE ARTHROSCOPY Left   . POSTERIOR LUMBAR FUSION    . ROTATOR CUFF REPAIR Left   . TONSILLECTOMY      Current Medications: Current Meds  Medication Sig  . acetaminophen (TYLENOL) 325 MG tablet Take 650 mg by mouth every 6 (six) hours as needed for mild pain.  . cetirizine (ZYRTEC) 10 MG tablet Take 10 mg by mouth daily.  . clopidogrel (PLAVIX) 75 MG tablet Take 1 tablet (75 mg total) by mouth daily.  . COMBIVENT RESPIMAT 20-100 MCG/ACT AERS respimat Inhale 1 puff into the lungs 4 (four) times daily.   Marland Kitchen escitalopram (LEXAPRO) 5 MG tablet Take 1 tablet by mouth daily.  Marland Kitchen levothyroxine (SYNTHROID, LEVOTHROID) 50 MCG tablet Take 1 tablet (50 mcg total)  by mouth every morning.  . Melatonin 3 MG TABS Take 1 tablet by mouth at bedtime.  . pantoprazole (PROTONIX) 40 MG tablet Take 1 tablet (40 mg total) by mouth daily.  . pravastatin (PRAVACHOL) 10 MG tablet Take 1 tablet (10 mg total) by mouth daily.  . sertraline (ZOLOFT) 100 MG tablet Take 1 tablet by mouth daily.  . tamsulosin (FLOMAX) 0.4 MG CAPS capsule Take 1 capsule (0.4 mg total) by mouth daily.  . vitamin B-12 (CYANOCOBALAMIN) 500 MCG tablet Take 1 tablet (500 mcg total) by mouth daily.  . [DISCONTINUED] hydrochlorothiazide (HYDRODIURIL) 25 MG tablet Take 1 tablet by mouth daily.     Allergies:   Lisinopril   Social History   Socioeconomic History  . Marital status: Widowed    Spouse name: Not on file  . Number of children: Not on file  . Years of education: Not on file  . Highest education level: Not on file  Occupational History  . Not on file  Social Needs  . Financial resource strain: Not on file  . Food insecurity:    Worry: Not on file    Inability: Not on file  . Transportation needs:    Medical: Not on file    Non-medical: Not on file  Tobacco Use  . Smoking status: Former Smoker    Years: 7.00    Types: Cigarettes    Last attempt to quit: 1963    Years since quitting: 56.7  . Smokeless tobacco: Never Used  Substance and Sexual Activity  . Alcohol use: Yes    Comment: 10/15/2017 "a few drinks/year"  . Drug use: No  . Sexual activity: Not on file  Lifestyle  . Physical activity:    Days per week: Not on file    Minutes per session: Not on file  . Stress: Not on file  Relationships  . Social connections:    Talks on phone: Not on file    Gets together: Not on file    Attends religious service: Not on file    Active member of club or organization: Not on file    Attends meetings of clubs or organizations: Not on file    Relationship status: Not on file  Other Topics Concern  . Not on file  Social History Narrative  . Not on file     Family  History: The patient's family history includes Hypertension in his father.  ROS:   Please see the history of present illness.    All other systems reviewed and are negative.  EKGs/Labs/Other Studies Reviewed:    The following studies were reviewed today:  Echo 04/04/18 Study Conclusions - Left ventricle: The cavity size was normal. There was mild   concentric hypertrophy. Systolic function was normal. The   estimated ejection fraction was in the range of 55% to 60%. Wall   motion was normal; there were no regional wall motion  abnormalities. Doppler parameters are consistent with abnormal   left ventricular relaxation (grade 1 diastolic dysfunction). - Aortic valve: There was trivial regurgitation. Valve area (VTI):   2.5 cm^2. Valve area (Vmax): 2.49 cm^2. Valve area (Vmean): 2.42   cm^2. - Atrial septum: No defect or patent foramen ovale was identified.   EKG:  EKG is ordered today.  The ekg ordered today demonstrates sinus bradycardia with RBBB (not new)  Recent Labs: 10/17/2017: Magnesium 1.9 04/04/2018: TSH 4.601 04/07/2018: ALT 15 04/16/2018: Hemoglobin 14.1; Platelets 498 04/18/2018: BUN 36; Creatinine, Ser 1.37; Potassium 4.1; Sodium 133  Recent Lipid Panel    Component Value Date/Time   CHOL 155 04/04/2018 0608   TRIG 276 (H) 04/04/2018 0608   HDL 30 (L) 04/04/2018 0608   CHOLHDL 5.2 04/04/2018 0608   VLDL 55 (H) 04/04/2018 0608   LDLCALC 70 04/04/2018 0608    Physical Exam:    VS:  BP (!) 144/70   Pulse (!) 50   Ht 5' 6"  (1.676 m)   Wt 172 lb 6.4 oz (78.2 kg)   SpO2 95%   BMI 27.83 kg/m     Wt Readings from Last 3 Encounters:  09/08/18 172 lb 6.4 oz (78.2 kg)  08/04/18 170 lb (77.1 kg)  06/06/18 172 lb 9.6 oz (78.3 kg)     GEN: Well nourished, well developed in no acute distress HEENT: Normal NECK: No JVD; No carotid bruits LYMPHATICS: No lymphadenopathy CARDIAC: RRR, no murmurs, rubs, gallops RESPIRATORY:  Clear to auscultation without rales,  wheezing or rhonchi  ABDOMEN: Soft, non-tender, non-distended MUSCULOSKELETAL:  No edema; No deformity  SKIN: Warm and dry NEUROLOGIC:  Alert and oriented x 3 PSYCHIATRIC:  Normal affect   ASSESSMENT:    1. Irregular heart beat   2. Leg edema, left   3. Essential hypertension, benign   4. Cerebrovascular accident (CVA) due to thrombosis of right middle cerebral artery (HCC)    PLAN:    In order of problems listed above:  Irregular heart beat - Plan: EKG 12-Lead, Pro b natriuretic peptide (BNP), LONG TERM MONITOR (3-14 DAYS), VAS [place] LOWER EXTREMITY VENOUS (DVT) I was able to obtain as faxed copy of the EMS EKG strips.  They are very difficult to read.  Strips reviewed with Dr. Percival Spanish, DOD.  It appears to be sinus rhythm with PACs and PVCs.  Given his history of stroke and current symptoms, will place a 14-day ZIO patch to rule out atrial fibrillation.  I had a long discussion with the patient and his daughter concerning anticoagulation if A. fib is noted.  She is reluctant to start anticoagulation.  I suggested we gather data and he will follow-up with Dr. Percival Spanish following the ZIO Patch results.  Leg edema, left - Plan: EKG 12-Lead, Pro b natriuretic peptide (BNP), LONG TERM MONITOR (3-14 DAYS), VAS [place] LOWER EXTREMITY VENOUS (DVT) He is describing unilateral lower extremity swelling.  I will obtain a lower extremity venous Doppler to rule out DVT.  Essential hypertension, benign He is complaining about incontinence on the HCTZ.  He would like to stop this medication.  We discussed how his lower extremity swelling may be exacerbated after stopping this diuretic.  We will start 25 mg of hydralazine twice daily.  Titrate up as needed.  He has an allergy to ACE inhibitors and cannot take beta-blockers or calcium channel blockers due to his sinus bradycardia.  Cerebrovascular accident (CVA) due to thrombosis of right middle cerebral artery ([place]) He remains on Plavix.  He has had 3 falls  over the past 3 months.  He reports that his feet get tangled.  He denies residual weakness following the stroke.  It sounds as though he has had a steady decline in function since his stroke.  Dyspnea on exertion, fatigue, low stamina Case discussed with Dr. Percival Spanish, DOD.  We will plan to draw a BNP today.  He recently had an echocardiogram in April with normal LV function and grade 1 diastolic dysfunction.  Differential includes paroxysmal A. fib versus status post stroke grade 1 diastolic dysfunction.  Follow-up with Dr. Percival Spanish in 1 month.   Medication Adjustments/Labs and Tests Ordered: Current medicines are reviewed at length with the patient today.  Concerns regarding medicines are outlined above.  Orders Placed This Encounter  Procedures  . Pro b natriuretic peptide (BNP)  . LONG TERM MONITOR (3-14 DAYS)  . EKG 12-Lead   Meds ordered this encounter  Medications  . hydrALAZINE (APRESOLINE) 25 MG tablet    Sig: Take 1 tablet (25 mg total) by mouth 2 (two) times daily.    Dispense:  60 tablet    Refill:  3    PLEASE DISCONTINUE HYDROCHLOROTHIAZIDE    Signed, Ledora Bottcher, PA  09/08/2018 4:00 PM    [facility]

## 2018-09-08 ENCOUNTER — Encounter: Payer: Self-pay | Admitting: Physician Assistant

## 2018-09-08 ENCOUNTER — Ambulatory Visit (INDEPENDENT_AMBULATORY_CARE_PROVIDER_SITE_OTHER): Payer: Medicare Other | Admitting: Physician Assistant

## 2018-09-08 VITALS — BP 144/70 | HR 50 | Ht 66.0 in | Wt 172.4 lb

## 2018-09-08 DIAGNOSIS — I499 Cardiac arrhythmia, unspecified: Secondary | ICD-10-CM

## 2018-09-08 DIAGNOSIS — I10 Essential (primary) hypertension: Secondary | ICD-10-CM | POA: Diagnosis not present

## 2018-09-08 DIAGNOSIS — I63311 Cerebral infarction due to thrombosis of right middle cerebral artery: Secondary | ICD-10-CM | POA: Diagnosis not present

## 2018-09-08 DIAGNOSIS — R600 Localized edema: Secondary | ICD-10-CM | POA: Diagnosis not present

## 2018-09-08 MED ORDER — HYDRALAZINE HCL 25 MG PO TABS: 25.0000 mg | ORAL_TABLET | Freq: Two times a day (BID) | ORAL | 3 refills | Status: DC

## 2018-09-08 NOTE — Patient Instructions (Signed)
Medication Instructions:  DISCONTINUE Hydrochlorothiazide START Hydralazine 56m Take 1 tablet twice a day  If you need a refill on your cardiac medications before your next appointment, please call your pharmacy.  Labwork: Your physician recommends that you return for lab work in: T[employer]in our office  Testing/Procedures: Your physician has recommended that you wear an 14 DAY ZIO monitor. Event monitors are medical devices that record the heart's electrical activity. Doctors most often u[place]athese monitors to diagnose arrhythmias. Arrhythmias are problems with the speed or rhythm of the heartbeat. The monitor is a small, portable device. You can wear one while you do your normal daily activities. This is usually used to diagnose what is causing palpitations/syncope (passing out).  Your physician has requested that you have a lower or upper extremity venous duplex. This test is an ultrasound of the veins in the legs or arms. It looks at venous blood flow that carries blood from the heart to the legs or arms. Allow one hour for a Lower Venous exam. Allow thirty minutes for an Upper Venous exam. There are no restrictions or special instructions. COMPLETE AT CG[facility] GardnerSTE 300 IF POSSIBLE SCHEDULE ON THE SAME DAY AS MONITOR  Follow-Up: Your physician recommends that you schedule a follow-up appointment in: [place] Any Other Special Instructions Will Be Listed Below (If Applicable).

## 2018-09-09 ENCOUNTER — Other Ambulatory Visit: Payer: Self-pay | Admitting: Physician Assistant

## 2018-09-09 DIAGNOSIS — R600 Localized edema: Secondary | ICD-10-CM

## 2018-09-09 LAB — PRO B NATRIURETIC PEPTIDE: NT-Pro BNP: 116 pg/mL (ref 0–486)

## 2018-09-14 ENCOUNTER — Telehealth: Payer: Self-pay | Admitting: Physician Assistant

## 2018-09-14 NOTE — Telephone Encounter (Signed)
Daughter updated with lab results. Verbalized understanding.

## 2018-09-14 NOTE — Telephone Encounter (Signed)
New Message   Patients daughter [name] called to obtain lab results. Please call to discuss.

## 2018-09-17 ENCOUNTER — Emergency Department (HOSPITAL_COMMUNITY)
Admission: EM | Admit: 2018-09-17 | Discharge: 2018-09-17 | Disposition: A | Discharge status: Home / Self Care | Payer: Medicare Other | Attending: Emergency Medicine | Admitting: Emergency Medicine

## 2018-09-17 ENCOUNTER — Emergency Department (HOSPITAL_COMMUNITY): Payer: Medicare Other

## 2018-09-17 ENCOUNTER — Other Ambulatory Visit: Payer: Self-pay

## 2018-09-17 DIAGNOSIS — W19XXXA Unspecified fall, initial encounter: Secondary | ICD-10-CM

## 2018-09-17 DIAGNOSIS — I129 Hypertensive chronic kidney disease with stage 1 through stage 4 chronic kidney disease, or unspecified chronic kidney disease: Secondary | ICD-10-CM | POA: Diagnosis not present

## 2018-09-17 DIAGNOSIS — N183 Chronic kidney disease, stage 3 (moderate): Secondary | ICD-10-CM | POA: Diagnosis not present

## 2018-09-17 DIAGNOSIS — Y92121 Bathroom in nursing home as the place of occurrence of the external cause: Secondary | ICD-10-CM | POA: Insufficient documentation

## 2018-09-17 DIAGNOSIS — E039 Hypothyroidism, unspecified: Secondary | ICD-10-CM | POA: Insufficient documentation

## 2018-09-17 DIAGNOSIS — H61122 Hematoma of pinna, left ear: Secondary | ICD-10-CM | POA: Diagnosis not present

## 2018-09-17 DIAGNOSIS — Y999 Unspecified external cause status: Secondary | ICD-10-CM | POA: Diagnosis not present

## 2018-09-17 DIAGNOSIS — Z87891 Personal history of nicotine dependence: Secondary | ICD-10-CM | POA: Insufficient documentation

## 2018-09-17 DIAGNOSIS — S7002XA Contusion of left hip, initial encounter: Secondary | ICD-10-CM | POA: Diagnosis not present

## 2018-09-17 DIAGNOSIS — Z7902 Long term (current) use of antithrombotics/antiplatelets: Secondary | ICD-10-CM | POA: Insufficient documentation

## 2018-09-17 DIAGNOSIS — F0390 Unspecified dementia without behavioral disturbance: Secondary | ICD-10-CM | POA: Insufficient documentation

## 2018-09-17 DIAGNOSIS — S79912A Unspecified injury of left hip, initial encounter: Secondary | ICD-10-CM | POA: Diagnosis present

## 2018-09-17 DIAGNOSIS — S0990XA Unspecified injury of head, initial encounter: Secondary | ICD-10-CM | POA: Diagnosis not present

## 2018-09-17 DIAGNOSIS — Z79899 Other long term (current) drug therapy: Secondary | ICD-10-CM | POA: Insufficient documentation

## 2018-09-17 DIAGNOSIS — Y9389 Activity, other specified: Secondary | ICD-10-CM | POA: Insufficient documentation

## 2018-09-17 DIAGNOSIS — S00432A Contusion of left ear, initial encounter: Secondary | ICD-10-CM

## 2018-09-17 DIAGNOSIS — W01198A Fall on same level from slipping, tripping and stumbling with subsequent striking against other object, initial encounter: Secondary | ICD-10-CM | POA: Diagnosis not present

## 2018-09-17 MED ORDER — IOPAMIDOL (ISOVUE-300) INJECTION 61%
INTRAVENOUS | Status: AC
  Filled 2018-09-17: qty 100

## 2018-09-17 NOTE — ED Provider Notes (Signed)
[facility] DEPT Provider Note   CSN: [id number] Arrival date & time: 09/17/18  0818     History   Chief Complaint Chief Complaint  Patient presents with  . Fall    HPI Anthony Hayes is a 83 y.o. male.  HPI Level 5 caveat due to dementia. Patient presents reportedly after fall.  Reportedly found on the floor in the bathroom.  Patient is without complaints but does have a history of dementia.  On Plavix.  Hematoma to left ear and reportedly had had nosebleed. Past Medical History:  Diagnosis Date  . Arthritis    "joints" (10/15/2017)  . Benign prostatic hyperplasia   . Complication of anesthesia    "last OR in 06/2017 affected him cognitively; hasn't got back to baseline yet" (10/15/2017)  . Dementia   . Depression   . GERD (gastroesophageal reflux disease)   . High cholesterol   . Hypertension   . Hypothyroidism   . Renal disease   . Renal disorder   . TIA (transient ischemic attack) early 2000s    Patient Active Problem List   Diagnosis Date Noted  . Irregular heart beat 09/08/2018  . Sleep disturbance   . Stage 3 chronic kidney disease ([place])   . Benign essential HTN   . Hypokalemia   . Hyponatremia   . Infarction of right basal ganglia ([place]) 04/06/2018  . Pressure injury of skin 04/06/2018  . Vitamin B12 deficiency   . Hypothyroidism   . Crohn's disease with complication ([employer])   . Benign prostatic hyperplasia   . Acute lower UTI   . Dementia without behavioral disturbance   . Acute ischemic stroke ([place]) 04/05/2018  . Leukocytosis   . Shortness of breath   . Acute encephalopathy   . CVA (cerebral vascular accident) ([place]) 04/03/2018  . Calculus, kidney 01/21/2018  . Acute blood loss anemia   . Mallory-Weiss syndrome   . Esophageal stricture   . Respiratory failure ([place])   . UGIB (upper gastrointestinal bleed) 10/16/2017  . GERD (gastroesophageal reflux disease) 10/16/2017  . Essential hypertension, benign 10/16/2017  .  Chest pain 10/15/2017  . Hypothyroidism (acquired) 06/08/2017  . Chronic kidney disease, stage III (moderate) ([place]) 03/19/2016  . Dyslipidemia 03/19/2016  . Degeneration of lumbar or lumbosacral intervertebral disc 07/10/2008  . Arthropathy, lower leg 05/23/2004    Past Surgical History:  Procedure Laterality Date  . ABDOMINAL EXPLORATION SURGERY  7/  . APPENDECTOMY    . CATARACT EXTRACTION W/ INTRAOCULAR LENS  IMPLANT, BILATERAL Bilateral   . COLECTOMY  1950s   "thought he had iteilis"  . ESOPHAGOGASTRODUODENOSCOPY N/A 10/22/2017   Procedure: ESOPHAGOGASTRODUODENOSCOPY (EGD);  Surgeon: Gatha Mayer, MD;  Location: [facility] ENDOSCOPY;  Service: Endoscopy;  Laterality: N/A;  . ESOPHAGOGASTRODUODENOSCOPY (EGD) WITH PROPOFOL N/A 10/16/2017   Procedure: ESOPHAGOGASTRODUODENOSCOPY (EGD) WITH PROPOFOL;  Surgeon: Milus Banister, MD;  Location: [facility] ENDOSCOPY;  Service: Endoscopy;  Laterality: N/A;  . FEMUR FRACTURE SURGERY Right 1950s  . FRACTURE SURGERY    . KNEE ARTHROSCOPY Left   . POSTERIOR LUMBAR FUSION    . ROTATOR CUFF REPAIR Left   . TONSILLECTOMY          Home Medications    Prior to Admission medications   Medication Sig Start Date End Date Taking? Authorizing Provider  clopidogrel (PLAVIX) 75 MG tablet Take 1 tablet (75 mg total) by mouth daily. 04/06/18  Yes Dhungel, Nishant, MD  COMBIVENT RESPIMAT 20-100 MCG/ACT AERS respimat Inhale 1 puff into the  lungs 4 (four) times daily.  06/01/18  Yes [provider]  cyanocobalamin (,VITAMIN B-12,) 1000 MCG/ML injection Inject 1,000 mcg into the muscle every 30 (thirty) days.   Yes [provider]  escitalopram (LEXAPRO) 5 MG tablet Take 1 tablet by mouth daily. 08/24/18  Yes [provider]  hydrALAZINE (APRESOLINE) 25 MG tablet Take 1 tablet (25 mg total) by mouth 2 (two) times daily. 09/08/18 01/06/19 Yes Duke, Tami Lin, PA  levothyroxine (SYNTHROID, LEVOTHROID) 50 MCG tablet Take 1 tablet (50 mcg total)  by mouth every morning. 04/05/18  Yes Dhungel, Nishant, MD  loratadine (CLARITIN) 10 MG tablet Take 10 mg by mouth daily.   Yes [provider]  Melatonin 3 MG TABS Take 1 tablet by mouth at bedtime.   Yes [provider]  pantoprazole (PROTONIX) 40 MG tablet Take 1 tablet (40 mg total) by mouth daily. 04/05/18  Yes Dhungel, Nishant, MD  pravastatin (PRAVACHOL) 10 MG tablet Take 1 tablet (10 mg total) by mouth daily. 06/06/18  Yes Venancio Poisson, NP  sertraline (ZOLOFT) 100 MG tablet Take 1 tablet by mouth daily. 08/17/18  Yes [provider]  tamsulosin (FLOMAX) 0.4 MG CAPS capsule Take 1 capsule (0.4 mg total) by mouth daily. 01/21/18  Yes Barnet Glasgow, NP    Family History Family History  Problem Relation Age of Onset  . Hypertension Father     Social History Social History   Tobacco Use  . Smoking status: Former Smoker    Years: 7.00    Types: Cigarettes    Last attempt to quit: 1963    Years since quitting: 56.7  . Smokeless tobacco: Never Used  Substance Use Topics  . Alcohol use: Yes    Comment: 10/15/2017 "a few drinks/year"  . Drug use: No     Allergies   Lisinopril   Review of Systems Review of Systems  Unable to perform ROS: Dementia     Physical Exam Updated Vital Signs BP (!) 183/74 (BP Location: Left Arm)   Pulse (!) 43   Temp 98.1 F (36.7 C) (Oral)   Resp 17   SpO2 100%   Physical Exam  Constitutional: He appears well-developed.  HENT:  Hematoma to left upper ear.  Also hematoma somewhat deeper back in the ear folds.  Bilateral TMs normal.  No active bleeding from nose.  No nasal tenderness.  Eyes: Pupils are equal, round, and reactive to light.  Neck: Neck supple.  Cardiovascular: Normal rate.  Pulmonary/Chest: Effort normal.  Abdominal: Soft.  Musculoskeletal: He exhibits no edema.  Neurological: He is alert.  Awake and pleasant, but demented.  Skin: Skin is warm. Capillary refill takes less than 2 seconds.      ED Treatments / Results  Labs (all labs ordered are listed, but only abnormal results are displayed) Labs Reviewed - No data to display  EKG EKG Interpretation  Date/Time:  Saturday September 17 2018 12:16:27 EDT Ventricular Rate:  45 PR Interval:    QRS Duration: 153 QT Interval:  577 QTC Calculation: 500 R Axis:   -78 Text Interpretation:  Sinus or ectopic atrial bradycardia Right bundle branch block LVH with IVCD and secondary repol abnrm Borderline prolonged QT interval Baseline wander in lead(s) V5 Confirmed by Davonna Belling [phone]) on 09/17/2018 12:20:24 PM   Radiology Ct Head Wo Contrast  Result Date: 09/17/2018 CLINICAL DATA:  Fall, hit left side of head, hematoma on left ear. Taking blood thinners. EXAM: CT HEAD WITHOUT CONTRAST TECHNIQUE: Contiguous axial  images were obtained from the base of the skull through the vertex without intravenous contrast. COMPARISON:  08/04/2018 FINDINGS: Brain: No evidence of acute infarction, hemorrhage, hydrocephalus, extra-axial collection or mass lesion/mass effect. There is ventricular sulcal enlargement reflecting mild to moderate diffuse atrophy, stable from the prior study. Patchy white matter hypoattenuation is present consistent with moderate chronic microvascular ischemic change. Old lacune infarcts noted in the thalami. Vascular: No hyperdense vessel or unexpected calcification. Skull: Normal. Negative for fracture or focal lesion. Sinuses/Orbits: Globes and orbits are unremarkable. Visualized sinuses are clear. Mastoid air cells and middle ear cavities are clear. Other: None. IMPRESSION: 1. No acute intracranial abnormalities. 2. Atrophy, chronic microvascular ischemic change and old thalamic lacune infarcts, stable from the prior CT. Electronically Signed   By: Lajean Manes M.D.   On: 09/17/2018 09:51   Dg Hip Unilat W Or Wo Pelvis 2-3 Views Left  Result Date: 09/17/2018 CLINICAL DATA:  Hip pain status post fall. EXAM: DG HIP  (WITH OR WITHOUT PELVIS) 2-3V LEFT COMPARISON:  None. FINDINGS: The bones appear adequately mineralized. There is no evidence of acute fracture or dislocation. There is spurring of the left greater trochanter. The hip joint spaces are maintained. Prominent lower lumbar spondylosis noted. Previous right femoral rod fixation, incompletely visualized. IMPRESSION: No evidence of acute left hip fracture or dislocation. Electronically Signed   By: Richardean Sale M.D.   On: 09/17/2018 11:38    Procedures Procedures (including critical care time)  Medications Ordered in ED Medications - No data to display   Initial Impression / Assessment and Plan / ED Course  I have reviewed the triage vital signs and the nursing notes.  Pertinent labs & imaging results that were available during my care of the patient were reviewed by me and considered in my medical decision making (see chart for details).     Patient with fall.  History of same.  Unknown events behind the fall.  Hematoma to left ear but negative head CT.  No nasal bleeding.  Face nontender.  Discussed with patient's son-in-law.  At this point will not drain the hematoma in the ear due to the patient's dementia.  Can follow-up with ENT as needed.  Also later developed contusion to left hip with some swelling.  It was not initially present but on reevaluation had swollen some.  X-ray reassuring on this.  Has a bradycardia but is been evaluated for this before.  Will discharge home.  Final Clinical Impressions(s) / ED Diagnoses   Final diagnoses:  Fall, initial encounter  Contusion of left hip, initial encounter  Ear hematoma, left, initial encounter    ED Discharge Orders    None       Davonna Belling, MD 09/17/18 [phone]

## 2018-09-17 NOTE — ED Notes (Signed)
Pt to xray

## 2018-09-17 NOTE — ED Triage Notes (Signed)
Pt arrived via [facility] from [place]. Unwitnessed fall going to the bathroom. [place] face forward hit his nose and hti the edge of the shower and floor. Nose bleed prior to arrival. Pt is on plavix.  On left ear there is blood, but no wound is seen, but it is swollen. Not complaining of pain, no neck or back pain. Towel roll for precaution.

## 2018-09-17 NOTE — ED Notes (Signed)
Bed: WA10 Expected date: 09/17/18 Expected time: 8:14 AM Means of arrival: Ambulance Comments: Fall from Nursing home

## 2018-09-17 NOTE — ED Notes (Signed)
ED Provider at bedside. 

## 2018-09-19 ENCOUNTER — Ambulatory Visit (INDEPENDENT_AMBULATORY_CARE_PROVIDER_SITE_OTHER): Payer: Medicare Other

## 2018-09-19 ENCOUNTER — Ambulatory Visit (HOSPITAL_COMMUNITY)
Admission: RE | Admit: 2018-09-19 | Discharge: 2018-09-19 | Disposition: A | Discharge status: Home / Self Care | Payer: Medicare Other | Source: Ambulatory Visit | Attending: Cardiovascular Disease | Admitting: Cardiovascular Disease

## 2018-09-19 ENCOUNTER — Other Ambulatory Visit: Payer: Self-pay | Admitting: Physician Assistant

## 2018-09-19 DIAGNOSIS — I493 Ventricular premature depolarization: Secondary | ICD-10-CM

## 2018-09-19 DIAGNOSIS — R600 Localized edema: Secondary | ICD-10-CM

## 2018-09-19 DIAGNOSIS — R0609 Other forms of dyspnea: Secondary | ICD-10-CM

## 2018-09-19 DIAGNOSIS — I639 Cerebral infarction, unspecified: Secondary | ICD-10-CM | POA: Diagnosis not present

## 2018-09-19 DIAGNOSIS — R001 Bradycardia, unspecified: Secondary | ICD-10-CM | POA: Diagnosis not present

## 2018-09-19 DIAGNOSIS — I491 Atrial premature depolarization: Secondary | ICD-10-CM | POA: Diagnosis not present

## 2018-09-19 DIAGNOSIS — I4891 Unspecified atrial fibrillation: Secondary | ICD-10-CM

## 2018-09-20 ENCOUNTER — Encounter: Payer: Self-pay | Admitting: Adult Health

## 2018-09-20 ENCOUNTER — Ambulatory Visit (INDEPENDENT_AMBULATORY_CARE_PROVIDER_SITE_OTHER): Payer: Medicare Other | Admitting: Adult Health

## 2018-09-20 VITALS — BP 147/68 | HR 47 | Wt 174.8 lb

## 2018-09-20 DIAGNOSIS — E785 Hyperlipidemia, unspecified: Secondary | ICD-10-CM

## 2018-09-20 DIAGNOSIS — I639 Cerebral infarction, unspecified: Secondary | ICD-10-CM

## 2018-09-20 DIAGNOSIS — I10 Essential (primary) hypertension: Secondary | ICD-10-CM

## 2018-09-20 NOTE — Patient Instructions (Signed)
Continue clopidogrel 75 mg daily  and pravastatin  for secondary stroke prevention  Continue to follow up with PCP regarding cholesterol and blood pressure management   Continue to stay active and maintain a healthy diet  Continue to monitor blood pressure at home  Maintain strict control of hypertension with blood pressure goal below 130/90, diabetes with hemoglobin A1c goal below 6.5% and cholesterol with LDL cholesterol (bad cholesterol) goal below 70 mg/dL. I also advised the patient to eat a healthy diet with plenty of whole grains, cereals, fruits and vegetables, exercise regularly and maintain ideal body weight.  Followup in the future with me in 6 months or call earlier if needed       Thank you for coming to see [place] at [facility]. I hope we have been able to provide you high quality care today.  You may receive a patient satisfaction survey over the next few weeks. We would appreciate your feedback and comments so that we may continue to improve ourselves and the health of our patients.

## 2018-09-20 NOTE — Progress Notes (Addendum)
[facility] [address] [place]. [place] [address] [phone]       OFFICE FOLLOW UP NOTE  [name] Date of Birth:  May 16, 1935 Medical Record Number:  [id number]   Reason for Referral:  hospital stroke follow up  CHIEF COMPLAINT:  Chief Complaint  Patient presents with  . Follow-up    Follow up from 05/2018 pt with Anthony Hayes his daughter, pt is at [facility] independent     HPI: Anthony Hayes is being seen today for initial visit in the office for right basal ganglia infarct on 04/03/18. History obtained from patient and chart review. Reviewed all radiology images and labs personally.  [name] is a 83 y.o. L handed white male with history of hypertension, hyperlipidemia, TIA, BPH, GERD, chronic kidney disease stage III, hypothyroidism, Crohn's disease status post colectomy as a teenager, suspected peripheral neuropathy, and former smoker who presented after waking with left hemiparesis and difficulty walking.  CT head reviewed and showed small vessel disease but no acute abnormality.  MRI head reviewed and showed right basal ganglia white matter infarct along with extensive small vessel disease.  Repeat CT head on 04/05/2018 showed expected evolution of right BG infarct.  Right carotid Doppler showed bilateral ICA stenosis of 1 to 39% with VAs antegrade.  TCD did show diffuse intracranial arthrosclerosis giving globally elevated pulsatility.  2D echo showed an EF of 55 to 60% without cardiac source of embolus.  EEG showed mild to moderate diffuse background slowing.  LDL 70 and as patient was not on statin PTA was recommended to start Pravachol 10 mg daily.  Patient was on aspirin 81 mg daily PTA and recommended DAPT for 3 weeks with aspirin and Plavix and then continue Plavix alone.  PT/OT recommended CIR.  06/06/18 visit: Patient is being seen today for hospital follow-up and is accompanied by his daughter Anthony Hayes.  He currently is living at [facility] assisted  living facility.  Prior to returning to [facility], he was discharged to [facility] after [employer]  He continues PT/OT at [facility] where he continues to make progress.  He has mild left hemiparesis along with mild left hand dexterity.  Has complaints of difficulty writing with left hand but this is improving.  Patient did have history of dementia prior to his stroke and per daughter, his memory has worsened since his stroke.  He was continued on both aspirin and Plavix but denies bleeding or bruising.  Prep the child was not listed as a medication on [facility] and daughter is unsure why.  Blood pressure mildly elevated at 147/67.  Patient remains active at assisted living facility with attending weekly field trips and playing trivia night nightly.  Daughter states in the past patient was being supplemented for B12 deficiency and was wondering if this is something that our office could treat him for.  Daughter was advised that she should speak to PCP or provider at [facility] for them to manage this.  Patient denies new or worsening stroke/TIA symptoms.  Interval history 09/20/2018: Patient returns today for scheduled follow-up appointment and is accompanied by his daughter.  Since prior visit, patient was seen in the ED on 08/04/2017 and 09/17/2018 due to falls.  Patient did not sustain any substantial injury with either fall and was discharged back to [facility] in stable condition.  He states overall he has been doing well.  He does continue to have some mild left hand weakness and  decreased memory but both have been stable without any worsening.  Therapies are on hold for right now as facility is currently hiring new company for therapy treatment sessions but this will be restarting up soon.  He continues to use his rolling walker for ambulation and daughter states when he does have a fall, it is when he forgets to take his walker with him.  Denies any additional falls since most  recent hospitalization.  He currently is wearing a 14-day cardiac monitor which was placed yesterday by cardiology due to episode of bradycardia during hospital admission.  Patient denies any concerns at this time and denies new or worsening stroke/TIA symptoms.     ROS:   14 system review of systems performed and negative with exception of incontinence of bowels, incontinence of bladder, walking difficulty and memory loss  PMH:  Past Medical History:  Diagnosis Date  . Arthritis    "joints" (10/15/2017)  . Benign prostatic hyperplasia   . Complication of anesthesia    "last OR in 06/2017 affected him cognitively; hasn't got back to baseline yet" (10/15/2017)  . Dementia   . Depression   . GERD (gastroesophageal reflux disease)   . High cholesterol   . Hypertension   . Hypothyroidism   . Renal disease   . Renal disorder   . TIA (transient ischemic attack) early 2000s    PSH:  Past Surgical History:  Procedure Laterality Date  . ABDOMINAL EXPLORATION SURGERY  7/  . APPENDECTOMY    . CATARACT EXTRACTION W/ INTRAOCULAR LENS  IMPLANT, BILATERAL Bilateral   . COLECTOMY  1950s   "thought he had iteilis"  . ESOPHAGOGASTRODUODENOSCOPY N/A 10/22/2017   Procedure: ESOPHAGOGASTRODUODENOSCOPY (EGD);  Surgeon: Gatha Mayer, MD;  Location: [facility] ENDOSCOPY;  Service: Endoscopy;  Laterality: N/A;  . ESOPHAGOGASTRODUODENOSCOPY (EGD) WITH PROPOFOL N/A 10/16/2017   Procedure: ESOPHAGOGASTRODUODENOSCOPY (EGD) WITH PROPOFOL;  Surgeon: Milus Banister, MD;  Location: [facility] ENDOSCOPY;  Service: Endoscopy;  Laterality: N/A;  . FEMUR FRACTURE SURGERY Right 1950s  . FRACTURE SURGERY    . KNEE ARTHROSCOPY Left   . POSTERIOR LUMBAR FUSION    . ROTATOR CUFF REPAIR Left   . TONSILLECTOMY      Social History:  Social History   Socioeconomic History  . Marital status: Widowed    Spouse name: Not on file  . Number of children: Not on file  . Years of education: Not on file  . Highest education  level: Not on file  Occupational History  . Not on file  Social Needs  . Financial resource strain: Not on file  . Food insecurity:    Worry: Not on file    Inability: Not on file  . Transportation needs:    Medical: Not on file    Non-medical: Not on file  Tobacco Use  . Smoking status: Former Smoker    Years: 7.00    Types: Cigarettes    Last attempt to quit: 1963    Years since quitting: 56.7  . Smokeless tobacco: Never Used  Substance and Sexual Activity  . Alcohol use: Yes    Comment: 10/15/2017 "a few drinks/year"  . Drug use: No  . Sexual activity: Not on file  Lifestyle  . Physical activity:    Days per week: Not on file    Minutes per session: Not on file  . Stress: Not on file  Relationships  . Social connections:    Talks on phone: Not on file    Gets  together: Not on file    Attends religious service: Not on file    Active member of club or organization: Not on file    Attends meetings of clubs or organizations: Not on file    Relationship status: Not on file  . Intimate partner violence:    Fear of current or ex partner: Not on file    Emotionally abused: Not on file    Physically abused: Not on file    Forced sexual activity: Not on file  Other Topics Concern  . Not on file  Social History Narrative  . Not on file    Family History:  Family History  Problem Relation Age of Onset  . Hypertension Father     Medications:   Current Outpatient Medications on File Prior to Visit  Medication Sig Dispense Refill  . clopidogrel (PLAVIX) 75 MG tablet Take 1 tablet (75 mg total) by mouth daily. 30 tablet 0  . COMBIVENT RESPIMAT 20-100 MCG/ACT AERS respimat Inhale 1 puff into the lungs 4 (four) times daily.     . cyanocobalamin (,VITAMIN B-12,) 1000 MCG/ML injection Inject 1,000 mcg into the muscle every 30 (thirty) days.    Marland Kitchen escitalopram (LEXAPRO) 5 MG tablet Take 1 tablet by mouth daily.    . hydrALAZINE (APRESOLINE) 25 MG tablet Take 1 tablet (25 mg  total) by mouth 2 (two) times daily. 60 tablet 3  . levothyroxine (SYNTHROID, LEVOTHROID) 50 MCG tablet Take 1 tablet (50 mcg total) by mouth every morning. 50 tablet 0  . loratadine (CLARITIN) 10 MG tablet Take 10 mg by mouth daily.    . Melatonin 3 MG TABS Take 1 tablet by mouth at bedtime.    . pantoprazole (PROTONIX) 40 MG tablet Take 1 tablet (40 mg total) by mouth daily. 30 tablet 0  . pravastatin (PRAVACHOL) 10 MG tablet Take 1 tablet (10 mg total) by mouth daily. 90 tablet 4  . sertraline (ZOLOFT) 100 MG tablet Take 1 tablet by mouth daily.    . tamsulosin (FLOMAX) 0.4 MG CAPS capsule Take 1 capsule (0.4 mg total) by mouth daily. 14 capsule 0   No current facility-administered medications on file prior to visit.     Allergies:   Allergies  Allergen Reactions  . Lisinopril Swelling    Swelling of lips      Physical Exam  Vitals:   09/20/18 1122  BP: (!) 147/68  Pulse: (!) 47  Weight: 174 lb 12.8 oz (79.3 kg)   Body mass index is 28.21 kg/m. No exam data present  General: well developed, pleasant elderly Caucasian male, well nourished, seated, in no evident distress Head: head normocephalic and atraumatic.   Neck: supple with no carotid or supraclavicular bruits Cardiovascular: regular rate and rhythm, no murmurs Musculoskeletal: no deformity Skin:  no rash/petichiae Vascular:  Normal pulses all extremities  Neurologic Exam Mental Status: Awake and fully alert. Oriented to place and time. Remote memory intact. Attention span, concentration and fund of knowledge appropriate. Mood and affect appropriate.  Cranial Nerves: Fundoscopic exam deferred. Pupils equal, briskly reactive to light. Extraocular movements full without nystagmus. Visual fields full to confrontation. Hearing intact. Facial sensation intact. Face, tongue, palate moves normally and symmetrically.  Motor: Normal bulk and tone. Normal strength in all tested extremity muscles Except for mild left grip  weakness. Sensory.: intact to touch , pinprick , position and vibratory sensation.  Coordination: Rapid alternating movements normal in all extremities. Finger-to-nose and heel-to-shin performed accurately bilaterally.  Decreased left  hand dexterity. Gait and Station: Arises from chair with mild difficulty but able to do independently.  Stance is hunched.  Patient does use rolling walker to assist with ambulation.  Reflexes: 1+ and symmetric. Toes downgoing.     Diagnostic Data (Labs, Imaging, Testing)  Ct Head Wo Contrast 04/03/2018 1. No acute intracranial abnormalities. 2. Atrophy and chronic microvascular ischemic change. Stable appearance from the prior study.  04/05/2018 1. Expected evolution acute/subacute infarcts involving the right corona radiata and basal ganglia. 2. No hemorrhage or other acute abnormality. 3. Stable diffuse white matter disease.  Mr Brain Wo Contrast (neuro Protocol) 04/03/2018 Small areas of acute infarction affecting the posterior right basal ganglia and radiating white matter tracts on the right. No swelling or hemorrhage. Extensive chronic small-vessel ischemic changes elsewhere throughout the brain as outlined above.   CT head Wo contrast 04/05/2018 IMPRESSION: 1. Expected evolution acute/subacute infarcts involving the right corona radiata and basal ganglia. 2. No hemorrhage or other acute abnormality. 3. Stable diffuse white matter disease.  2D Echocardiogram  EF 55-60% with no source of embolus.   Carotid Doppler   There is 1-39% bilateral ICA stenosis. Vertebral artery flow is antegrade.    Transcranial Doppler Poor L temporal window.  Diffuse intracranial atherosclerosis.   EEG This awake andasleepEEG is abnormal due to mild to moderatediffuse backgroundslowing.    ASSESSMENT: Kruze Atchley is a 83 y.o. year old male here with right basal ganglia infarct on 04/03/18 secondary to SVD. Vascular risk factors include HLD and HTN.  Patient  is being seen today for a follow-up visit and does continue to have mild left hand decreased dexterity and memory loss but overall has been stable.  He has had 2 additional ED visits since prior visit due to falls.  PLAN: -Continue clopidogrel 75 mg daily  and pravachol 59m for secondary stroke prevention. -F/u with PCP regarding your HLD and HTN management -continue to monitor BP at home -[facility] -Maintain strict control of hypertension with blood pressure goal below 130/90, diabetes with hemoglobin A1c goal below 6.5% and cholesterol with LDL cholesterol (bad cholesterol) goal below 70 mg/dL. I also advised the patient to eat a healthy diet with plenty of whole grains, cereals, fruits and vegetables, exercise regularly and maintain ideal body weight.  Follow up in 6 months or call earlier if needed   Greater than 50% of time during this 25 minute visit was spent on counseling,explanation of diagnosis of R BG infarct, reviewing risk factor management of HLD and HTN, planning of further management, discussion with patient and family and coordination of care    JVenancio Poisson [facility]St [facility] [address]SCabo RojoGAnniston [place] [id number] Phone [phone] [phone]

## 2018-09-23 NOTE — Progress Notes (Signed)
I agree with the above plan 

## 2018-10-05 ENCOUNTER — Emergency Department (HOSPITAL_COMMUNITY): Payer: Medicare Other

## 2018-10-05 ENCOUNTER — Other Ambulatory Visit: Payer: Self-pay

## 2018-10-05 ENCOUNTER — Encounter (HOSPITAL_COMMUNITY): Payer: Self-pay | Admitting: *Deleted

## 2018-10-05 ENCOUNTER — Emergency Department (HOSPITAL_COMMUNITY)
Admission: EM | Admit: 2018-10-05 | Discharge: 2018-10-05 | Disposition: A | Discharge status: Home / Self Care | Payer: Medicare Other | Attending: Emergency Medicine | Admitting: Emergency Medicine

## 2018-10-05 DIAGNOSIS — R001 Bradycardia, unspecified: Secondary | ICD-10-CM | POA: Diagnosis not present

## 2018-10-05 DIAGNOSIS — Y999 Unspecified external cause status: Secondary | ICD-10-CM | POA: Insufficient documentation

## 2018-10-05 DIAGNOSIS — Y939 Activity, unspecified: Secondary | ICD-10-CM | POA: Insufficient documentation

## 2018-10-05 DIAGNOSIS — F0390 Unspecified dementia without behavioral disturbance: Secondary | ICD-10-CM | POA: Insufficient documentation

## 2018-10-05 DIAGNOSIS — W19XXXA Unspecified fall, initial encounter: Secondary | ICD-10-CM

## 2018-10-05 DIAGNOSIS — I129 Hypertensive chronic kidney disease with stage 1 through stage 4 chronic kidney disease, or unspecified chronic kidney disease: Secondary | ICD-10-CM | POA: Diagnosis not present

## 2018-10-05 DIAGNOSIS — Y92121 Bathroom in nursing home as the place of occurrence of the external cause: Secondary | ICD-10-CM | POA: Insufficient documentation

## 2018-10-05 DIAGNOSIS — Z87891 Personal history of nicotine dependence: Secondary | ICD-10-CM | POA: Insufficient documentation

## 2018-10-05 DIAGNOSIS — Z79899 Other long term (current) drug therapy: Secondary | ICD-10-CM | POA: Insufficient documentation

## 2018-10-05 DIAGNOSIS — E039 Hypothyroidism, unspecified: Secondary | ICD-10-CM | POA: Insufficient documentation

## 2018-10-05 DIAGNOSIS — S0990XA Unspecified injury of head, initial encounter: Secondary | ICD-10-CM | POA: Diagnosis not present

## 2018-10-05 DIAGNOSIS — N183 Chronic kidney disease, stage 3 (moderate): Secondary | ICD-10-CM | POA: Insufficient documentation

## 2018-10-05 DIAGNOSIS — D691 Qualitative platelet defects: Secondary | ICD-10-CM | POA: Insufficient documentation

## 2018-10-05 DIAGNOSIS — Z7902 Long term (current) use of antithrombotics/antiplatelets: Secondary | ICD-10-CM

## 2018-10-05 LAB — CBC WITH DIFFERENTIAL/PLATELET
Abs Immature Granulocytes: 0.02 10*3/uL (ref 0.00–0.07)
Basophils Absolute: 0 10*3/uL (ref 0.0–0.1)
Basophils Relative: 0 %
Eosinophils Absolute: 0.1 10*3/uL (ref 0.0–0.5)
Eosinophils Relative: 2 %
HCT: 34.4 % — ABNORMAL LOW (ref 39.0–52.0)
HEMOGLOBIN: 11 g/dL — AB (ref 13.0–17.0)
Immature Granulocytes: 0 %
LYMPHS PCT: 14 %
Lymphs Abs: 0.9 10*3/uL (ref 0.7–4.0)
MCH: 28.3 pg (ref 26.0–34.0)
MCHC: 32 g/dL (ref 30.0–36.0)
MCV: 88.4 fL (ref 80.0–100.0)
MONOS PCT: 9 %
Monocytes Absolute: 0.6 10*3/uL (ref 0.1–1.0)
Neutro Abs: 5 10*3/uL (ref 1.7–7.7)
Neutrophils Relative %: 75 %
Platelets: 392 10*3/uL (ref 150–400)
RBC: 3.89 MIL/uL — ABNORMAL LOW (ref 4.22–5.81)
RDW: 15.4 % (ref 11.5–15.5)
WBC: 6.8 10*3/uL (ref 4.0–10.5)
nRBC: 0 % (ref 0.0–0.2)

## 2018-10-05 LAB — BASIC METABOLIC PANEL
ANION GAP: 9 (ref 5–15)
BUN: 12 mg/dL (ref 8–23)
CHLORIDE: 108 mmol/L (ref 98–111)
CO2: 23 mmol/L (ref 22–32)
Calcium: 8.8 mg/dL — ABNORMAL LOW (ref 8.9–10.3)
Creatinine, Ser: 0.96 mg/dL (ref 0.61–1.24)
GFR calc non Af Amer: >60 mL/min (ref >60)
GLUCOSE: 100 mg/dL — AB (ref 70–99)
POTASSIUM: 3.5 mmol/L (ref 3.5–5.1)
Sodium: 140 mmol/L (ref 135–145)

## 2018-10-05 NOTE — ED Provider Notes (Signed)
[facility] EMERGENCY DEPARTMENT Provider Note   CSN: [id number] Arrival date & time: 10/05/18  0900     History   Chief Complaint Chief Complaint  Patient presents with  . Bradycardia  . Fall    HPI Anthony Hayes is a 83 y.o. male.  HPI Patient lives at assisted living.  He had been seen by staff at 6 AM.  They rechecked at 7 AM and patient was found on the bathroom floor.  He reports he had tried to go to the bathroom and fallen.  He has a history of imbalance and frequent falls due to unsteady gait.  Patient cannot remember the exact details of how or why he fell but he believes he lost his balance.  He has obvious hematoma to the forehead.  He does not think he had loss of consciousness.  He was awake and with normal GCS upon being found.  He is denying neck pain.  He is denying pain to the chest or the back.  He denies extremity pain.  He has had a slightly swollen and red foot on the left for over a week.  He was seen at his cardiologist's office and a DVT ultrasound done that ruled out DVT.  He cannot recall any specific injury.  He does not note that his significantly painful for him.  He has no other complaints of extremity injury or pain.  Patient is daughter reports that at baseline patient has very loose stool and has problems with stool incontinence.  She reports that when he has to go he has to really worry and rush to the bathroom.  She reports this is 1 of the problems contributing to the falls. Past Medical History:  Diagnosis Date  . Arthritis    "joints" (10/15/2017)  . Benign prostatic hyperplasia   . Complication of anesthesia    "last OR in 06/2017 affected him cognitively; hasn't got back to baseline yet" (10/15/2017)  . Dementia ([place])   . Depression   . GERD (gastroesophageal reflux disease)   . High cholesterol   . Hypertension   . Hypothyroidism   . Renal disease   . Renal disorder   . TIA (transient ischemic attack) early 2000s     Patient Active Problem List   Diagnosis Date Noted  . Irregular heart beat 09/08/2018  . Sleep disturbance   . Stage 3 chronic kidney disease ([place])   . Benign essential HTN   . Hypokalemia   . Hyponatremia   . Infarction of right basal ganglia ([place]) 04/06/2018  . Pressure injury of skin 04/06/2018  . Vitamin B12 deficiency   . Hypothyroidism   . Crohn's disease with complication ([place])   . Benign prostatic hyperplasia   . Acute lower UTI   . Dementia without behavioral disturbance ([place])   . Acute ischemic stroke ([place]) 04/05/2018  . Leukocytosis   . Shortness of breath   . Acute encephalopathy   . CVA (cerebral vascular accident) ([place]) 04/03/2018  . Calculus, kidney 01/21/2018  . Acute blood loss anemia   . Mallory-Weiss syndrome   . Esophageal stricture   . Respiratory failure ([place])   . UGIB (upper gastrointestinal bleed) 10/16/2017  . GERD (gastroesophageal reflux disease) 10/16/2017  . Essential hypertension, benign 10/16/2017  . Chest pain 10/15/2017  . Hypothyroidism (acquired) 06/08/2017  . Chronic kidney disease, stage III (moderate) ([place]) 03/19/2016  . Dyslipidemia 03/19/2016  . Degeneration of lumbar or lumbosacral intervertebral disc 07/10/2008  . Arthropathy, lower  leg 05/23/2004    Past Surgical History:  Procedure Laterality Date  . ABDOMINAL EXPLORATION SURGERY  7/  . APPENDECTOMY    . CATARACT EXTRACTION W/ INTRAOCULAR LENS  IMPLANT, BILATERAL Bilateral   . COLECTOMY  1950s   "thought he had iteilis"  . ESOPHAGOGASTRODUODENOSCOPY N/A 10/22/2017   Procedure: ESOPHAGOGASTRODUODENOSCOPY (EGD);  Surgeon: Gatha Mayer, MD;  Location: [facility] ENDOSCOPY;  Service: Endoscopy;  Laterality: N/A;  . ESOPHAGOGASTRODUODENOSCOPY (EGD) WITH PROPOFOL N/A 10/16/2017   Procedure: ESOPHAGOGASTRODUODENOSCOPY (EGD) WITH PROPOFOL;  Surgeon: Milus Banister, MD;  Location[facility] ENDOSCOPY;  Service: Endoscopy;  Laterality: N/A;  . FEMUR FRACTURE SURGERY Right 1950s  .  FRACTURE SURGERY    . KNEE ARTHROSCOPY Left   . POSTERIOR LUMBAR FUSION    . ROTATOR CUFF REPAIR Left   . TONSILLECTOMY          Home Medications    Prior to Admission medications   Medication Sig Start Date End Date Taking? Authorizing Provider  acetaminophen (TYLENOL) 325 MG tablet Take 650 mg by mouth every 6 (six) hours as needed for mild pain.   Yes [provider]  clopidogrel (PLAVIX) 75 MG tablet Take 1 tablet (75 mg total) by mouth daily. 04/06/18  Yes Dhungel, Nishant, MD  COMBIVENT RESPIMAT 20-100 MCG/ACT AERS respimat Inhale 1 puff into the lungs 4 (four) times daily.  06/01/18  Yes [provider]  cyanocobalamin (,VITAMIN B-12,) 1000 MCG/ML injection Inject 1,000 mcg into the muscle every 30 (thirty) days.   Yes [provider]  escitalopram (LEXAPRO) 5 MG tablet Take 1 tablet by mouth daily. 08/24/18  Yes [provider]  hydrALAZINE (APRESOLINE) 25 MG tablet Take 1 tablet (25 mg total) by mouth 2 (two) times daily. 09/08/18 01/06/19 Yes Duke, Tami Lin, PA  levothyroxine (SYNTHROID, LEVOTHROID) 50 MCG tablet Take 1 tablet (50 mcg total) by mouth every morning. 04/05/18  Yes Dhungel, Nishant, MD  loratadine (CLARITIN) 10 MG tablet Take 10 mg by mouth daily.   Yes [provider]  Melatonin 3 MG TABS Take 1 tablet by mouth at bedtime.   Yes [provider]  pantoprazole (PROTONIX) 40 MG tablet Take 1 tablet (40 mg total) by mouth daily. 04/05/18  Yes Dhungel, Nishant, MD  pravastatin (PRAVACHOL) 10 MG tablet Take 1 tablet (10 mg total) by mouth daily. 06/06/18  Yes Venancio Poisson, NP  sertraline (ZOLOFT) 100 MG tablet Take 1 tablet by mouth daily. 08/17/18  Yes [provider]  tamsulosin (FLOMAX) 0.4 MG CAPS capsule Take 1 capsule (0.4 mg total) by mouth daily. 01/21/18  Yes Barnet Glasgow, NP    Family History Family History  Problem Relation Age of Onset  . Hypertension Father     Social History Social  History   Tobacco Use  . Smoking status: Former Smoker    Years: 7.00    Types: Cigarettes    Last attempt to quit: 1963    Years since quitting: 56.8  . Smokeless tobacco: Never Used  Substance Use Topics  . Alcohol use: Yes    Comment: 10/15/2017 "a few drinks/year"  . Drug use: No     Allergies   Lisinopril   Review of Systems Review of Systems 10 Systems reviewed and are negative for acute change except as noted in the HPI.   Physical Exam Updated Vital Signs BP (!) 173/85   Pulse (!) 43   Temp 98 F (36.7 C) (Oral)   Resp 19   SpO2 96%  Physical Exam  Constitutional: He is oriented to person, place, and time. He appears well-developed and well-nourished. No distress.  HENT:  Nose: Nose normal.  Mouth/Throat: Oropharynx is clear and moist.  6 cm diffuse hematoma to the center of the forehead.  Superficial overlying abrasion with no active bleeding.  Eyes: Pupils are equal, round, and reactive to light. EOM are normal.  Neck: Neck supple.  No C-spine tenderness to palpation.  Cardiovascular: Regular rhythm, normal heart sounds and intact distal pulses.  Bradycardia  Pulmonary/Chest: Effort normal and breath sounds normal. He exhibits no tenderness.  Abdominal: Soft. Bowel sounds are normal. He exhibits no distension. There is no tenderness. There is no guarding.  Musculoskeletal: Normal range of motion. He exhibits edema.  Upper extremities can be put through range of motion with snout pain or finding of deformities.  Both lower extremities can be put into flexion at the hip and the knee and patient can push against resistance without pain.  No deformities.  Patient does have mild swelling of the foot on the left with slight erythema over the dorsum.  Calf is soft and nontender.  Apparently, this is been present for over a week.  No known etiology at this time.  DVT has been ruled out.  Neurological: He is alert and oriented to person, place, and time. No cranial  nerve deficit. He exhibits normal muscle tone. Coordination normal.  Skin: Skin is warm and dry.  Psychiatric: He has a normal mood and affect.     ED Treatments / Results  Labs (all labs ordered are listed, but only abnormal results are displayed) Labs Reviewed  BASIC METABOLIC PANEL - Abnormal; Notable for the following components:      Result Value   Glucose, Bld 100 (*)    Calcium 8.8 (*)    All other components within normal limits  CBC WITH DIFFERENTIAL/PLATELET - Abnormal; Notable for the following components:   RBC 3.89 (*)    Hemoglobin 11.0 (*)    HCT 34.4 (*)    All other components within normal limits    EKG EKG Interpretation  Date/Time:  Wednesday October 05 2018 09:02:17 EDT Ventricular Rate:  46 PR Interval:    QRS Duration: 153 QT Interval:  596 QTC Calculation: 522 R Axis:   -65 Text Interpretation:  Sinus bradycardia Right bundle branch block LVH with IVCD and secondary repol abnrm Prolonged QT interval no change from previous Confirmed by Charlesetta Shanks [phone]) on 10/05/2018 9:44:14 AM   Radiology Ct Head Wo Contrast  Result Date: 10/05/2018 CLINICAL DATA:  Trauma.  Fall. EXAM: CT HEAD WITHOUT CONTRAST CT CERVICAL SPINE WITHOUT CONTRAST TECHNIQUE: Multidetector CT imaging of the head and cervical spine was performed following the standard protocol without intravenous contrast. Multiplanar CT image reconstructions of the cervical spine were also generated. COMPARISON:  09/17/2018 FINDINGS: CT HEAD FINDINGS Brain: No evidence of acute infarction, hemorrhage, hydrocephalus, extra-axial collection or mass lesion/mass effect. There is ventricular sulcal enlargement reflecting mild diffuse atrophy, stable from the prior exam. Old lacunar infarct is noted in the right thalamus. Patchy white matter hypoattenuation is also noted consistent with moderate chronic microvascular ischemic change. Vascular: No hyperdense vessel or unexpected calcification. Skull: Normal.  Negative for fracture or focal lesion. Sinuses/Orbits: Globes and orbits are unremarkable. Sinuses and mastoid air cells are clear. Other: None. CT CERVICAL SPINE FINDINGS Alignment: Normal. Skull base and vertebrae: No acute fracture. No primary bone lesion or focal pathologic process. Soft tissues and spinal canal: No  prevertebral fluid or swelling. No visible canal hematoma. Disc levels: Mild to moderate loss of disc height at C3-C4, mild loss of disc height at C4-C5, moderate loss of disc height at C5-C6 and C6-C7. There is spondylotic disc bulging with endplate spurring at these levels. Facet degenerative changes noted bilaterally throughout most of the cervical spine. No convincing disc herniation. Skeletal structures are demineralized. Upper chest: No mass or adenopathy.  No acute findings. Other: None. IMPRESSION: HEAD CT 1. No acute intracranial abnormalities.  No skull fracture. 2. Atrophy and chronic microvascular ischemic change, stable from prior study. CERVICAL CT 1. No fracture or acute finding. Electronically Signed   By: Lajean Manes M.D.   On: 10/05/2018 11:00   Ct Cervical Spine Wo Contrast  Result Date: 10/05/2018 CLINICAL DATA:  Trauma.  Fall. EXAM: CT HEAD WITHOUT CONTRAST CT CERVICAL SPINE WITHOUT CONTRAST TECHNIQUE: Multidetector CT imaging of the head and cervical spine was performed following the standard protocol without intravenous contrast. Multiplanar CT image reconstructions of the cervical spine were also generated. COMPARISON:  09/17/2018 FINDINGS: CT HEAD FINDINGS Brain: No evidence of acute infarction, hemorrhage, hydrocephalus, extra-axial collection or mass lesion/mass effect. There is ventricular sulcal enlargement reflecting mild diffuse atrophy, stable from the prior exam. Old lacunar infarct is noted in the right thalamus. Patchy white matter hypoattenuation is also noted consistent with moderate chronic microvascular ischemic change. Vascular: No hyperdense vessel or  unexpected calcification. Skull: Normal. Negative for fracture or focal lesion. Sinuses/Orbits: Globes and orbits are unremarkable. Sinuses and mastoid air cells are clear. Other: None. CT CERVICAL SPINE FINDINGS Alignment: Normal. Skull base and vertebrae: No acute fracture. No primary bone lesion or focal pathologic process. Soft tissues and spinal canal: No prevertebral fluid or swelling. No visible canal hematoma. Disc levels: Mild to moderate loss of disc height at C3-C4, mild loss of disc height at C4-C5, moderate loss of disc height at C5-C6 and C6-C7. There is spondylotic disc bulging with endplate spurring at these levels. Facet degenerative changes noted bilaterally throughout most of the cervical spine. No convincing disc herniation. Skeletal structures are demineralized. Upper chest: No mass or adenopathy.  No acute findings. Other: None. IMPRESSION: HEAD CT 1. No acute intracranial abnormalities.  No skull fracture. 2. Atrophy and chronic microvascular ischemic change, stable from prior study. CERVICAL CT 1. No fracture or acute finding. Electronically Signed   By: Lajean Manes M.D.   On: 10/05/2018 11:00   Dg Foot Complete Left  Result Date: 10/05/2018 CLINICAL DATA:  Fall today left foot swelling. EXAM: LEFT FOOT - COMPLETE 3+ VIEW COMPARISON:  No comparison studies available. FINDINGS: Bones are diffusely demineralized. No evidence for an acute fracture. No subluxation or dislocation. Degenerative changes are noted in scattered MTP joints. Mineralization at the Achilles tendon insertion is compatible with prior trauma or repetitive injury. No worrisome lytic or sclerotic osseous abnormality. IMPRESSION: No acute bony abnormality. Electronically Signed   By: Misty Stanley M.D.   On: 10/05/2018 10:29    Procedures Procedures (including critical care time)  Medications Ordered in ED Medications - No data to display   Initial Impression / Assessment and Plan / ED Course  I have reviewed  the triage vital signs and the nursing notes.  Pertinent labs & imaging results that were available during my care of the patient were reviewed by me and considered in my medical decision making (see chart for details).    CT head and cervical spine no acute findings.  Patient's mental status  is normal.  No neurologic deficits.  Stable for return to observe care at nursing facility and with family members.  Patient has no pain complaints.  He does incidentally have a slightly swollen left foot with some erythema.  This is already had DVT rule out last week.  She does not have significant pain associated.  No leukocytosis, fever or immediate signs of infectious cause.  They are counseled on close follow-up with her PCP for monitoring this and return precautions.  Patient has had chronic bradycardia and worn an event monitor.  He and his daughter do not feel that syncope was the etiology of patient's fall today.  He has had some chronic problems with gait instability and easy fall.  At this time, I do not feel that further cardiac\syncope work-up is indicated.  Patient and his daughter are in agreement and will continue their outpatient management. Final Clinical Impressions(s) / ED Diagnoses   Final diagnoses:  Fall, initial encounter  Injury of head, initial encounter  Bradycardia  Platelet inhibition due to Plavix    ED Discharge Orders    None       Charlesetta Shanks, MD 10/05/18 1209

## 2018-10-05 NOTE — ED Triage Notes (Signed)
Pt daughter at bed side now. Daughter reports pt has a slow heart rate in the 50's. Daughter reports she recently sent off a Heart monitor that he had on for a week

## 2018-10-05 NOTE — ED Triage Notes (Signed)
Per ems [facility]. Staff found Pt on floor . Staff last saw Pt at 0600 this AM when taking Pt to [facility]. Pt reported he remembered the fall . Pt reported he felt dizzy before the fall.

## 2018-10-05 NOTE — ED Notes (Signed)
Patient transported to X-ray 

## 2018-10-05 NOTE — Discharge Instructions (Addendum)
1. follow-up with your doctor within the next 2 to 3 days for recheck. 2.  Return to the emergency department if you have any signs of confusion, unusual incoordination, worsening headache or other concerning symptoms. 3.  Continue to follow-up with your family doctor regarding the redness and swelling that you have been experiencing in your left foot.  Return to the emergency department if redness is quickly getting worse, quickly worsening pain or other concerning symptoms.

## 2018-10-11 ENCOUNTER — Encounter: Payer: Self-pay | Admitting: Cardiology

## 2018-10-14 ENCOUNTER — Emergency Department (HOSPITAL_COMMUNITY): Payer: Medicare Other

## 2018-10-14 ENCOUNTER — Other Ambulatory Visit: Payer: Self-pay

## 2018-10-14 ENCOUNTER — Emergency Department (HOSPITAL_COMMUNITY)
Admission: EM | Admit: 2018-10-14 | Discharge: 2018-10-14 | Disposition: A | Discharge status: Home / Self Care | Payer: Medicare Other | Attending: Emergency Medicine | Admitting: Emergency Medicine

## 2018-10-14 ENCOUNTER — Encounter (HOSPITAL_COMMUNITY): Payer: Self-pay | Admitting: Emergency Medicine

## 2018-10-14 DIAGNOSIS — Z79899 Other long term (current) drug therapy: Secondary | ICD-10-CM | POA: Insufficient documentation

## 2018-10-14 DIAGNOSIS — Y939 Activity, unspecified: Secondary | ICD-10-CM | POA: Insufficient documentation

## 2018-10-14 DIAGNOSIS — Z87891 Personal history of nicotine dependence: Secondary | ICD-10-CM | POA: Diagnosis not present

## 2018-10-14 DIAGNOSIS — M25512 Pain in left shoulder: Secondary | ICD-10-CM | POA: Insufficient documentation

## 2018-10-14 DIAGNOSIS — Z8673 Personal history of transient ischemic attack (TIA), and cerebral infarction without residual deficits: Secondary | ICD-10-CM | POA: Insufficient documentation

## 2018-10-14 DIAGNOSIS — Y929 Unspecified place or not applicable: Secondary | ICD-10-CM | POA: Diagnosis not present

## 2018-10-14 DIAGNOSIS — Z9181 History of falling: Secondary | ICD-10-CM | POA: Diagnosis not present

## 2018-10-14 DIAGNOSIS — R001 Bradycardia, unspecified: Secondary | ICD-10-CM | POA: Insufficient documentation

## 2018-10-14 DIAGNOSIS — I10 Essential (primary) hypertension: Secondary | ICD-10-CM | POA: Insufficient documentation

## 2018-10-14 DIAGNOSIS — W19XXXA Unspecified fall, initial encounter: Secondary | ICD-10-CM | POA: Insufficient documentation

## 2018-10-14 DIAGNOSIS — E039 Hypothyroidism, unspecified: Secondary | ICD-10-CM | POA: Insufficient documentation

## 2018-10-14 DIAGNOSIS — Y999 Unspecified external cause status: Secondary | ICD-10-CM | POA: Diagnosis not present

## 2018-10-14 DIAGNOSIS — Z7901 Long term (current) use of anticoagulants: Secondary | ICD-10-CM | POA: Insufficient documentation

## 2018-10-14 DIAGNOSIS — S0990XA Unspecified injury of head, initial encounter: Secondary | ICD-10-CM | POA: Insufficient documentation

## 2018-10-14 DIAGNOSIS — W010XXA Fall on same level from slipping, tripping and stumbling without subsequent striking against object, initial encounter: Secondary | ICD-10-CM

## 2018-10-14 NOTE — ED Provider Notes (Signed)
[facility] EMERGENCY DEPARTMENT Provider Note   CSN: [id number] Arrival date & time: 10/14/18  0444     History   Chief Complaint Chief Complaint  Patient presents with  . Fall    HPI Anthony Hayes is a 83 y.o. male.  Patient with h/o dementia, frequent falls, TIA on Plavix -- presents after falls. Patient reportedly had 3 falls yesterday.  He is from [facility].  On initial exam he denies any complaint, however has left shoulder pain upon movement of the left upper extremity.  He states that he hit his head.  He denies headache or neck pain.  No vomiting reported.  Patient reportedly fell 3 times yesterday.  He is unable to give further details about the fall.  Level 5 caveat due to dementia.     Past Medical History:  Diagnosis Date  . Arthritis    "joints" (10/15/2017)  . Benign prostatic hyperplasia   . Complication of anesthesia    "last OR in 06/2017 affected him cognitively; hasn't got back to baseline yet" (10/15/2017)  . Dementia ([place])   . Depression   . GERD (gastroesophageal reflux disease)   . High cholesterol   . Hypertension   . Hypothyroidism   . Renal disease   . Renal disorder   . TIA (transient ischemic attack) early 2000s    Patient Active Problem List   Diagnosis Date Noted  . Irregular heart beat 09/08/2018  . Sleep disturbance   . Stage 3 chronic kidney disease ([place])   . Benign essential HTN   . Hypokalemia   . Hyponatremia   . Infarction of right basal ganglia ([place]) 04/06/2018  . Pressure injury of skin 04/06/2018  . Vitamin B12 deficiency   . Hypothyroidism   . Crohn's disease with complication ([place])   . Benign prostatic hyperplasia   . Acute lower UTI   . Dementia without behavioral disturbance ([place])   . Acute ischemic stroke ([place]) 04/05/2018  . Leukocytosis   . Shortness of breath   . Acute encephalopathy   . CVA (cerebral vascular accident) ([place]) 04/03/2018  . Calculus, kidney 01/21/2018  . Acute blood loss  anemia   . Mallory-Weiss syndrome   . Esophageal stricture   . Respiratory failure ([place])   . UGIB (upper gastrointestinal bleed) 10/16/2017  . GERD (gastroesophageal reflux disease) 10/16/2017  . Essential hypertension, benign 10/16/2017  . Chest pain 10/15/2017  . Hypothyroidism (acquired) 06/08/2017  . Chronic kidney disease, stage III (moderate) ([place]) 03/19/2016  . Dyslipidemia 03/19/2016  . Degeneration of lumbar or lumbosacral intervertebral disc 07/10/2008  . Arthropathy, lower leg 05/23/2004    Past Surgical History:  Procedure Laterality Date  . ABDOMINAL EXPLORATION SURGERY  7/  . APPENDECTOMY    . CATARACT EXTRACTION W/ INTRAOCULAR LENS  IMPLANT, BILATERAL Bilateral   . COLECTOMY  1950s   "thought he had iteilis"  . ESOPHAGOGASTRODUODENOSCOPY N/A 10/22/2017   Procedure: ESOPHAGOGASTRODUODENOSCOPY (EGD);  Surgeon: Gatha Mayer, MD;  Location: [facility] ENDOSCOPY;  Service: Endoscopy;  Laterality: N/A;  . ESOPHAGOGASTRODUODENOSCOPY (EGD) WITH PROPOFOL N/A 10/16/2017   Procedure: ESOPHAGOGASTRODUODENOSCOPY (EGD) WITH PROPOFOL;  Surgeon: Milus Banister, MD;  Location: [facility] ENDOSCOPY;  Service: Endoscopy;  Laterality: N/A;  . FEMUR FRACTURE SURGERY Right 1950s  . FRACTURE SURGERY    . KNEE ARTHROSCOPY Left   . POSTERIOR LUMBAR FUSION    . ROTATOR CUFF REPAIR Left   . TONSILLECTOMY          Home Medications  Prior to Admission medications   Medication Sig Start Date End Date Taking? Authorizing Provider  acetaminophen (TYLENOL) 325 MG tablet Take 650 mg by mouth every 6 (six) hours as needed for mild pain.   Yes [provider]  clopidogrel (PLAVIX) 75 MG tablet Take 1 tablet (75 mg total) by mouth daily. 04/06/18  Yes Dhungel, Nishant, MD  COMBIVENT RESPIMAT 20-100 MCG/ACT AERS respimat Inhale 1 puff into the lungs 4 (four) times daily.  06/01/18  Yes [provider]  cyanocobalamin (,VITAMIN B-12,) 1000 MCG/ML injection Inject 1,000 mcg into the muscle  every 30 (thirty) days.   Yes [provider]  escitalopram (LEXAPRO) 5 MG tablet Take 1 tablet by mouth daily. 08/24/18  Yes [provider]  hydrALAZINE (APRESOLINE) 25 MG tablet Take 1 tablet (25 mg total) by mouth 2 (two) times daily. 09/08/18 01/06/19 Yes Duke, Tami Lin, PA  levothyroxine (SYNTHROID, LEVOTHROID) 50 MCG tablet Take 1 tablet (50 mcg total) by mouth every morning. Patient taking differently: Take 50 mcg by mouth daily before breakfast.  04/05/18  Yes Dhungel, Nishant, MD  loratadine (CLARITIN) 10 MG tablet Take 10 mg by mouth daily.   Yes [provider]  Melatonin 3 MG TABS Take 3 mg by mouth at bedtime.    Yes [provider]  pantoprazole (PROTONIX) 40 MG tablet Take 1 tablet (40 mg total) by mouth daily. 04/05/18  Yes Dhungel, Nishant, MD  pravastatin (PRAVACHOL) 10 MG tablet Take 1 tablet (10 mg total) by mouth daily. 06/06/18  Yes Venancio Poisson, NP  sertraline (ZOLOFT) 100 MG tablet Take 1 tablet by mouth daily. 08/17/18  Yes [provider]  tamsulosin (FLOMAX) 0.4 MG CAPS capsule Take 1 capsule (0.4 mg total) by mouth daily. 01/21/18  Yes Barnet Glasgow, NP    Family History Family History  Problem Relation Age of Onset  . Hypertension Father     Social History Social History   Tobacco Use  . Smoking status: Former Smoker    Years: 7.00    Types: Cigarettes    Last attempt to quit: 1963    Years since quitting: 56.8  . Smokeless tobacco: Never Used  Substance Use Topics  . Alcohol use: Yes    Comment: 10/15/2017 "a few drinks/year"  . Drug use: No     Allergies   Lisinopril   Review of Systems Review of Systems  All other systems reviewed and are negative.    Physical Exam Updated Vital Signs BP (!) 166/63   Pulse (!) 49   Resp 17   SpO2 94%   Physical Exam  Constitutional: He is oriented to person, place, and time. He appears well-developed and well-nourished.  HENT:  Head:  Normocephalic and atraumatic. Head is without raccoon's eyes and without Battle's sign.  Right Ear: Tympanic membrane, external ear and ear canal normal. No hemotympanum.  Left Ear: Tympanic membrane, external ear and ear canal normal. No hemotympanum.  Nose: Nose normal. No nasal septal hematoma.  Mouth/Throat: Oropharynx is clear and moist.  Eyes: Pupils are equal, round, and reactive to light. Conjunctivae, EOM and lids are normal. Right eye exhibits no discharge. Left eye exhibits no discharge.  No visible hyphema  Neck: Normal range of motion. Neck supple.  Cardiovascular: Regular rhythm and normal heart sounds. Bradycardia present.  Pulmonary/Chest: Effort normal and breath sounds normal.  Abdominal: Soft. There is no tenderness.  Musculoskeletal: Normal range of motion.       Right shoulder: He exhibits normal  range of motion, no tenderness and no bony tenderness.       Left shoulder: He exhibits tenderness and bony tenderness. He exhibits normal range of motion.       Right hip: He exhibits normal range of motion, no tenderness and no bony tenderness.       Left hip: He exhibits normal range of motion, no tenderness and no bony tenderness.       Cervical back: He exhibits normal range of motion, no tenderness and no bony tenderness.       Thoracic back: He exhibits no tenderness and no bony tenderness.       Lumbar back: He exhibits no tenderness and no bony tenderness.       Arms: Neurological: He is alert and oriented to person, place, and time. He has normal strength and normal reflexes. No cranial nerve deficit or sensory deficit. Coordination normal. GCS eye subscore is 4. GCS verbal subscore is 5. GCS motor subscore is 6.  Skin: Skin is warm and dry.  Psychiatric: He has a normal mood and affect.  Nursing note and vitals reviewed.    ED Treatments / Results  Labs (all labs ordered are listed, but only abnormal results are displayed) Labs Reviewed - No data to  display  EKG None  Radiology No results found.  Procedures Procedures (including critical care time)  Medications Ordered in ED Medications - No data to display   Initial Impression / Assessment and Plan / ED Course  I have reviewed the triage vital signs and the nursing notes.  Pertinent labs & imaging results that were available during my care of the patient were reviewed by me and considered in my medical decision making (see chart for details).     Patient seen and examined. Work-up initiated. It is 6am and patient is very sleepy. He is difficult to keep awake and he is unable to give much of a history. As far as I can discern, his main injury is in his left shoulder. He can range hips with assistance. No obvious head trauma, but given possible head injury and antiplatelet, will need head CT.   Vital signs reviewed and are as follows: BP (!) 166/63   Pulse (!) 49   Resp 17   SpO2 94%   Reviewed previous EKGs which also show bradycardia.   9:23 AM patient discussed with and seen by Dr. Dayna Barker who has also spoken with the patient's daughter who is now at bedside.  Patient will be ambulated and given something to drink.  He was complaining of some new leg pain.  If he ambulates okay, will defer x-ray.  Patient has ambulated and has had coffee to drink.  He is at his baseline per his daughter.  They are ready for discharge.  Encourage PCP follow-up as needed.   Final Clinical Impressions(s) / ED Diagnoses   Final diagnoses:  Acute pain of left shoulder  Fall on same level from slipping, tripping or stumbling, initial encounter  Bradycardia   Patient with reported trip and fall onto his left shoulder.  Patient has had multiple recent falls and several emergency department visits for the same.  Head CT was negative.  Shoulder x-ray was negative.  Patient has ambulated at his baseline and is drinking.  He is at his mental baseline per daughter who is at bedside.  At this  time comfortable with discharge back to his facility.  ED Discharge Orders    None  Carlisle Cater, PA-C 10/14/18 [id number]    Veryl Speak, MD 10/14/18 2303

## 2018-10-14 NOTE — ED Notes (Signed)
Pt is back from CT

## 2018-10-14 NOTE — Discharge Instructions (Signed)
Please read and follow all provided instructions.  Your diagnoses today include:  1. Acute pain of left shoulder   2. Fall on same level from slipping, tripping or stumbling, initial encounter     Tests performed today include:  CT scan of your head that did not show any serious injury.  X-ray of the left shoulder -shows some arthritis, no fractures  Vital signs. See below for your results today.   Medications prescribed:   None  Take any prescribed medications only as directed.  Home care instructions:  Follow any educational materials contained in this packet.  Follow-up instructions: Please follow-up with your primary care provider in the next 7 days for further evaluation of your symptoms.   Return instructions:  SEEK IMMEDIATE MEDICAL ATTENTION IF:  There is confusion or drowsiness (although children frequently become drowsy after injury).   You cannot awaken the injured person.   You have more than one episode of vomiting.   You notice dizziness or unsteadiness which is getting worse, or inability to walk.   You have convulsions or unconsciousness.   You experience severe, persistent headaches not relieved by Tylenol.  You cannot use arms or legs normally.   There are changes in pupil sizes. (This is the black center in the colored part of the eye)   There is clear or bloody discharge from the nose or ears.   You have change in speech, vision, swallowing, or understanding.   Localized weakness, numbness, tingling, or change in bowel or bladder control.  You have any other emergent concerns.  Additional Information: You have had a head injury which does not appear to require admission at this time.  Your vital signs today were: BP (!) 174/81    Pulse (!) 48    Temp 97.7 F (36.5 C) (Oral)    Resp 12    SpO2 98%  If your blood pressure (BP) was elevated above 135/85 this visit, please have this repeated by your doctor within one  month. --------------

## 2018-10-14 NOTE — ED Notes (Addendum)
Patient ambulated with a walker without incident, patient also given coffee as requested. His daughter is at the bedside.

## 2018-10-14 NOTE — ED Notes (Signed)
Pt. To CT via stretcher. 

## 2018-10-14 NOTE — ED Triage Notes (Signed)
Pt arrives to ED from [facility] with complaints of a fall. EMS reports Pt has had 3 falls today. Pt is alert and oriented. Pt was not hurt from first two falls but the third fall he fell in the shower and injured his left shoulder. Initial pain in shoulder was 4/10. Current pain is 1/10. Pt is on Plavix. Pt placed in position of comfort with bed locked and lowered, call bell in reach.

## 2018-10-14 NOTE — ED Notes (Signed)
Daughter numberRodena Piety [phone]

## 2018-10-14 NOTE — ED Notes (Signed)
D/c reviewed with patient and daughter. She transported him back to the assisted living.

## 2018-10-14 NOTE — ED Notes (Signed)
Pt states he lost his balance and fell. He states he uses a walker but cant remember if he was using one the first two times he fell today.

## 2018-10-18 ENCOUNTER — Telehealth: Payer: Self-pay | Admitting: Adult Health

## 2018-10-18 NOTE — Telephone Encounter (Signed)
Please advise NP at facility that it would be in patients best interest to continue plavix at this time for secondary stroke prevention as he has failed aspirin in the past for stroke prevention. Being on plavix versus Asprin does not put him at a significantly increased risk for complications due to falls. Please let me know if they have any further questions. Thank you.

## 2018-10-18 NOTE — Telephone Encounter (Signed)
LEft vm for patients daughter [name] to call back about plavix vs aspirin.

## 2018-10-18 NOTE — Telephone Encounter (Signed)
Pt daughter has called(on DPR-[place],Anita [phone] ) has called to speak about the NP where pt lives wants to take pt off of clopidogrel (PLAVIX) 75 MG tablet and put pt on Aspirin due to multiple falls.  Please call to discuss

## 2018-10-19 NOTE — Telephone Encounter (Signed)
Rn spoke with daughter about her father stopping plavix and getting back on aspirin. The daughter stated its due to multiple falls at [facility] assisted living.Pt is in the wheelchair and forgets to ask for help at times. Rn stated per Janett Billow NP when pt was aspirin it still cause a stroke. Rn stated she wants him to continue plavix ongoing for stroke prevention. [name] stated the NP at [facility] was wondering if he can take a high dosage of aspirin. Rn recommend the daughter have the NP at [facility] gives [place] a call to discuss the high dose aspirin vs plavix. [name] verbalized understanding, and will let her know.

## 2018-10-22 ENCOUNTER — Emergency Department (HOSPITAL_COMMUNITY): Payer: Medicare Other

## 2018-10-22 ENCOUNTER — Emergency Department (HOSPITAL_COMMUNITY)
Admission: EM | Admit: 2018-10-22 | Discharge: 2018-10-22 | Disposition: A | Discharge status: Home / Self Care | Payer: Medicare Other | Attending: Emergency Medicine | Admitting: Emergency Medicine

## 2018-10-22 ENCOUNTER — Encounter (HOSPITAL_COMMUNITY): Payer: Self-pay | Admitting: Emergency Medicine

## 2018-10-22 ENCOUNTER — Other Ambulatory Visit: Payer: Self-pay

## 2018-10-22 DIAGNOSIS — Z79899 Other long term (current) drug therapy: Secondary | ICD-10-CM | POA: Diagnosis not present

## 2018-10-22 DIAGNOSIS — Y9389 Activity, other specified: Secondary | ICD-10-CM | POA: Insufficient documentation

## 2018-10-22 DIAGNOSIS — Y998 Other external cause status: Secondary | ICD-10-CM | POA: Insufficient documentation

## 2018-10-22 DIAGNOSIS — Y92129 Unspecified place in nursing home as the place of occurrence of the external cause: Secondary | ICD-10-CM | POA: Insufficient documentation

## 2018-10-22 DIAGNOSIS — I129 Hypertensive chronic kidney disease with stage 1 through stage 4 chronic kidney disease, or unspecified chronic kidney disease: Secondary | ICD-10-CM | POA: Diagnosis not present

## 2018-10-22 DIAGNOSIS — E039 Hypothyroidism, unspecified: Secondary | ICD-10-CM | POA: Diagnosis not present

## 2018-10-22 DIAGNOSIS — Z87891 Personal history of nicotine dependence: Secondary | ICD-10-CM | POA: Diagnosis not present

## 2018-10-22 DIAGNOSIS — F0390 Unspecified dementia without behavioral disturbance: Secondary | ICD-10-CM | POA: Diagnosis not present

## 2018-10-22 DIAGNOSIS — E7800 Pure hypercholesterolemia, unspecified: Secondary | ICD-10-CM | POA: Diagnosis not present

## 2018-10-22 DIAGNOSIS — S0990XA Unspecified injury of head, initial encounter: Secondary | ICD-10-CM | POA: Insufficient documentation

## 2018-10-22 DIAGNOSIS — W050XXA Fall from non-moving wheelchair, initial encounter: Secondary | ICD-10-CM | POA: Diagnosis not present

## 2018-10-22 DIAGNOSIS — Z7902 Long term (current) use of antithrombotics/antiplatelets: Secondary | ICD-10-CM | POA: Diagnosis not present

## 2018-10-22 DIAGNOSIS — N183 Chronic kidney disease, stage 3 (moderate): Secondary | ICD-10-CM | POA: Insufficient documentation

## 2018-10-22 DIAGNOSIS — Z8673 Personal history of transient ischemic attack (TIA), and cerebral infarction without residual deficits: Secondary | ICD-10-CM | POA: Insufficient documentation

## 2018-10-22 DIAGNOSIS — R001 Bradycardia, unspecified: Secondary | ICD-10-CM | POA: Insufficient documentation

## 2018-10-22 DIAGNOSIS — W19XXXA Unspecified fall, initial encounter: Secondary | ICD-10-CM

## 2018-10-22 NOTE — ED Provider Notes (Addendum)
[facility] EMERGENCY DEPARTMENT Provider Note   CSN: [id number] Arrival date & time: 10/22/18  1658     History   Chief Complaint Chief Complaint  Patient presents with  . Fall    HPI Anthony Hayes is a 83 y.o. male presenting for evaluation after fall.  All 5 caveat, patient with dementia.  Per staff members at [facility], patient was leaning forward in his wheelchair when the wheelchair lost balance, patient fell on his back, hitting the back of his head.  There is no loss of consciousness.  Patient was on Plavix.  No obvious injury elsewhere.  Patient has been acting his baseline since the fall.   Additional history obtained from chart review. Pt had a stroke in April 2019.  Since then, he has had gait instability.  He has been seen 4 times in the past month for a fall, was recently put in a wheelchair this past week due to his imbalance.  Patient with a history of bradycardia and hypertension. Other PMH of HTN, CKD, hypothyroidism.   HPI  Past Medical History:  Diagnosis Date  . Arthritis    "joints" (10/15/2017)  . Benign prostatic hyperplasia   . Complication of anesthesia    "last OR in 06/2017 affected him cognitively; hasn't got back to baseline yet" (10/15/2017)  . Dementia ([place])   . Depression   . GERD (gastroesophageal reflux disease)   . High cholesterol   . Hypertension   . Hypothyroidism   . Renal disease   . Renal disorder   . TIA (transient ischemic attack) early 2000s    Patient Active Problem List   Diagnosis Date Noted  . Irregular heart beat 09/08/2018  . Sleep disturbance   . Stage 3 chronic kidney disease ([place])   . Benign essential HTN   . Hypokalemia   . Hyponatremia   . Infarction of right basal ganglia ([place]) 04/06/2018  . Pressure injury of skin 04/06/2018  . Vitamin B12 deficiency   . Hypothyroidism   . Crohn's disease with complication ([place])   . Benign prostatic hyperplasia   . Acute lower UTI   . Dementia  without behavioral disturbance ([place])   . Acute ischemic stroke ([place]) 04/05/2018  . Leukocytosis   . Shortness of breath   . Acute encephalopathy   . CVA (cerebral vascular accident) ([place]) 04/03/2018  . Calculus, kidney 01/21/2018  . Acute blood loss anemia   . Mallory-Weiss syndrome   . Esophageal stricture   . Respiratory failure ([place])   . UGIB (upper gastrointestinal bleed) 10/16/2017  . GERD (gastroesophageal reflux disease) 10/16/2017  . Essential hypertension, benign 10/16/2017  . Chest pain 10/15/2017  . Hypothyroidism (acquired) 06/08/2017  . Chronic kidney disease, stage III (moderate) ([place]) 03/19/2016  . Dyslipidemia 03/19/2016  . Degeneration of lumbar or lumbosacral intervertebral disc 07/10/2008  . Arthropathy, lower leg 05/23/2004    Past Surgical History:  Procedure Laterality Date  . ABDOMINAL EXPLORATION SURGERY  7/  . APPENDECTOMY    . CATARACT EXTRACTION W/ INTRAOCULAR LENS  IMPLANT, BILATERAL Bilateral   . COLECTOMY  1950s   "thought he had iteilis"  . ESOPHAGOGASTRODUODENOSCOPY N/A 10/22/2017   Procedure: ESOPHAGOGASTRODUODENOSCOPY (EGD);  Surgeon: Gatha Mayer, MD;  Location: [facility] ENDOSCOPY;  Service: Endoscopy;  Laterality: N/A;  . ESOPHAGOGASTRODUODENOSCOPY (EGD) WITH PROPOFOL N/A 10/16/2017   Procedure: ESOPHAGOGASTRODUODENOSCOPY (EGD) WITH PROPOFOL;  Surgeon: Milus Banister, MD;  Location: [facility] ENDOSCOPY;  Service: Endoscopy;  Laterality: N/A;  . FEMUR FRACTURE  SURGERY Right 1950s  . FRACTURE SURGERY    . KNEE ARTHROSCOPY Left   . POSTERIOR LUMBAR FUSION    . ROTATOR CUFF REPAIR Left   . TONSILLECTOMY          Home Medications    Prior to Admission medications   Medication Sig Start Date End Date Taking? Authorizing Provider  acetaminophen (TYLENOL) 325 MG tablet Take 650 mg by mouth every 6 (six) hours as needed for mild pain.    [provider]  clopidogrel (PLAVIX) 75 MG tablet Take 1 tablet (75 mg total) by mouth daily. 04/06/18    Dhungel, Nishant, MD  COMBIVENT RESPIMAT 20-100 MCG/ACT AERS respimat Inhale 1 puff into the lungs 4 (four) times daily.  06/01/18   [provider]  cyanocobalamin (,VITAMIN B-12,) 1000 MCG/ML injection Inject 1,000 mcg into the muscle every 30 (thirty) days.    [provider]  escitalopram (LEXAPRO) 5 MG tablet Take 1 tablet by mouth daily. 08/24/18   [provider]  hydrALAZINE (APRESOLINE) 25 MG tablet Take 1 tablet (25 mg total) by mouth 2 (two) times daily. 09/08/18 01/06/19  Duke, Tami Lin, PA  levothyroxine (SYNTHROID, LEVOTHROID) 50 MCG tablet Take 1 tablet (50 mcg total) by mouth every morning. Patient taking differently: Take 50 mcg by mouth daily before breakfast.  04/05/18   Dhungel, Flonnie Overman, MD  loratadine (CLARITIN) 10 MG tablet Take 10 mg by mouth daily.    [provider]  Melatonin 3 MG TABS Take 3 mg by mouth at bedtime.     [provider]  pantoprazole (PROTONIX) 40 MG tablet Take 1 tablet (40 mg total) by mouth daily. 04/05/18   Dhungel, Flonnie Overman, MD  pravastatin (PRAVACHOL) 10 MG tablet Take 1 tablet (10 mg total) by mouth daily. 06/06/18   Venancio Poisson, NP  sertraline (ZOLOFT) 100 MG tablet Take 1 tablet by mouth daily. 08/17/18   [provider]  tamsulosin (FLOMAX) 0.4 MG CAPS capsule Take 1 capsule (0.4 mg total) by mouth daily. 01/21/18   Barnet Glasgow, NP    Family History Family History  Problem Relation Age of Onset  . Hypertension Father     Social History Social History   Tobacco Use  . Smoking status: Former Smoker    Years: 7.00    Types: Cigarettes    Last attempt to quit: 1963    Years since quitting: 56.8  . Smokeless tobacco: Never Used  Substance Use Topics  . Alcohol use: Yes    Comment: 10/15/2017 "a few drinks/year"  . Drug use: No     Allergies   Lisinopril   Review of Systems Review of Systems  Unable to perform ROS: Dementia     Physical Exam Updated Vital  Signs BP (!) 173/92   Pulse (!) 51   Temp 97.9 F (36.6 C) (Oral)   Resp (!) 21   SpO2 97%   Physical Exam  Constitutional: He is oriented to person, place, and time. He appears well-developed and well-nourished. No distress.  Elderly male resting comfortably in no acute distress  HENT:  Head: Normocephalic and atraumatic.  Mouth/Throat: Uvula is midline, oropharynx is clear and moist and mucous membranes are normal.  No obvious head injury.  No hematoma or laceration.  OP clear without tonsillar swelling or exudate.  Uvula midline with equal palate rise.  No malocclusion.  Eyes: Pupils are equal, round, and reactive to light. Conjunctivae and EOM are normal.  EOMI and PERRLA  Neck: Normal  range of motion. Neck supple.  Moving head/neck easily during exam  Cardiovascular: Regular rhythm and intact distal pulses.  Bradycardic, baseline  Pulmonary/Chest: Effort normal and breath sounds normal. No respiratory distress. He has no wheezes.  Abdominal: Soft. He exhibits no distension and no mass. There is no tenderness. There is no rebound and no guarding.  Musculoskeletal: Normal range of motion. He exhibits edema.  1+ pitting edema of the left lower leg with what appears to be chronic skin changes and erythema.  Pedal pulses intact bilaterally.  This is baseline per chart review. Strength equal bilaterally of upper and lower extremities.  Moves all extremities on command.  Neurological: He is alert and oriented to person, place, and time. No sensory deficit.  A$Ox3, but pt is confused. Mental status baseline per family.   Skin: Skin is warm and dry. Capillary refill takes less than 2 seconds.  Psychiatric: He has a normal mood and affect.  Nursing note and vitals reviewed.    ED Treatments / Results  Labs (all labs ordered are listed, but only abnormal results are displayed) Labs Reviewed - No data to display  EKG None  Radiology Ct Head Wo Contrast  Result Date:  10/22/2018 CLINICAL DATA:  [place] out of wheelchair today. No LOC. Denies any pain EXAM: CT HEAD WITHOUT CONTRAST CT CERVICAL SPINE WITHOUT CONTRAST TECHNIQUE: Multidetector CT imaging of the head and cervical spine was performed following the standard protocol without intravenous contrast. Multiplanar CT image reconstructions of the cervical spine were also generated. COMPARISON:  CT of the head on 10/14/2018 FINDINGS: CT HEAD FINDINGS Brain: There is central and cortical atrophy. Periventricular white matter changes are consistent with small vessel disease. Small remote lacunar infarcts are identified in the basal ganglia and RIGHT thalamus. There is no intra or extra-axial fluid collection or mass lesion. The basilar cisterns and ventricles have a normal appearance. There is no CT evidence for acute infarction or hemorrhage. Vascular: No hyperdense vessel or unexpected calcification. Skull: Normal. Negative for fracture or focal lesion. Sinuses/Orbits: No acute finding. Other: None. CT CERVICAL SPINE FINDINGS Alignment: Vertebral bodies are normally aligned with preservation of the normal cervical lordosis. Skull base and vertebrae: No acute fracture. No primary bone lesion or focal pathologic process. Soft tissues and spinal canal: No prevertebral fluid or swelling. No visible canal hematoma. Disc levels: There is moderate midcervical disc height loss, particularly at C5-6 and C6-7. Upper chest: Negative. Other: None IMPRESSION: 1. Atrophy and small vessel disease. 2. Remote small lacunar infarcts in the basal ganglia and RIGHT thalamus. 3.  No evidence for acute intracranial abnormality. 4. No evidence for acute cervical spine abnormality. Cervical spondylosis. Electronically Signed   By: Nolon Nations M.D.   On: 10/22/2018 18:48   Ct Cervical Spine Wo Contrast  Result Date: 10/22/2018 CLINICAL DATA:  [place] out of wheelchair today. No LOC. Denies any pain EXAM: CT HEAD WITHOUT CONTRAST CT CERVICAL SPINE  WITHOUT CONTRAST TECHNIQUE: Multidetector CT imaging of the head and cervical spine was performed following the standard protocol without intravenous contrast. Multiplanar CT image reconstructions of the cervical spine were also generated. COMPARISON:  CT of the head on 10/14/2018 FINDINGS: CT HEAD FINDINGS Brain: There is central and cortical atrophy. Periventricular white matter changes are consistent with small vessel disease. Small remote lacunar infarcts are identified in the basal ganglia and RIGHT thalamus. There is no intra or extra-axial fluid collection or mass lesion. The basilar cisterns and ventricles have a normal appearance. There is  no CT evidence for acute infarction or hemorrhage. Vascular: No hyperdense vessel or unexpected calcification. Skull: Normal. Negative for fracture or focal lesion. Sinuses/Orbits: No acute finding. Other: None. CT CERVICAL SPINE FINDINGS Alignment: Vertebral bodies are normally aligned with preservation of the normal cervical lordosis. Skull base and vertebrae: No acute fracture. No primary bone lesion or focal pathologic process. Soft tissues and spinal canal: No prevertebral fluid or swelling. No visible canal hematoma. Disc levels: There is moderate midcervical disc height loss, particularly at C5-6 and C6-7. Upper chest: Negative. Other: None IMPRESSION: 1. Atrophy and small vessel disease. 2. Remote small lacunar infarcts in the basal ganglia and RIGHT thalamus. 3.  No evidence for acute intracranial abnormality. 4. No evidence for acute cervical spine abnormality. Cervical spondylosis. Electronically Signed   By: Nolon Nations M.D.   On: 10/22/2018 18:48    Procedures Procedures (including critical care time)  Medications Ordered in ED Medications - No data to display   Initial Impression / Assessment and Plan / ED Course  I have reviewed the triage vital signs and the nursing notes.  Pertinent labs & imaging results that were available during my  care of the patient were reviewed by me and considered in my medical decision making (see chart for details).     Presenting for evaluation after a fall.  Physical exam reassuring, no obvious new neuro deficits.  Patient is acting normally per family.  CT head and neck negative for bleed, fracture, or step-offs.  Per chart review, patient's vitals are baseline.  After discussion with facility staff, this appears to have been a mechanical fall.  I do not believe patient needs further labs or imaging at this time.  Had labs October 9 which were unchanged from baseline. Case discussed with attending, Dr. [name] agrees to plan.  At this time, patient appears safe for discharge.  As patient is nonambulatory, and wheelchair bound, will call for PTAR.  Return precautions given.  Patient and family state they understand and agree to plan.  Final Clinical Impressions(s) / ED Diagnoses   Final diagnoses:  Fall, initial encounter  Bradycardia    ED Discharge Orders    None         Franchot Heidelberg, PA-C 10/22/18 2237    Dorie Rank, MD 10/25/18 [phone]

## 2018-10-22 NOTE — ED Triage Notes (Signed)
Pt BIB [facility] from [facility] after a fall from wheelchair hitting his head. No LOC, pt does take plavix, A&O x 3 which is his baseline, no obvious injury. Hypertensive with EMS 196/88, hx of the same.

## 2018-10-22 NOTE — Discharge Instructions (Addendum)
Use Tylenol as needed for pain. Follow-up with the primary care doctor as needed. Return to the emergency room with any new, worsening, or concerning symptoms.

## 2018-10-26 NOTE — Progress Notes (Signed)
Cardiology Office Note   Date:  10/28/2018   ID:  [name], DOB 01/11/1935, MRN [id number]  PCP:  Etter Sjogren, DO  Cardiologist:   Minus Breeding, MD Referring:  Etter Sjogren, DO  Chief Complaint  Patient presents with  . Bradycardia      History of Present Illness: Anthony Hayes is a 83 y.o. male who presents for follow up of arrhythmia.   Patient has mild dementia and resides at [facility] assisted [facility] facility.  He was noted to have an acute ischemic CVA in April 2019. He was seen in follow-up on 06/06/2018 with outpatient neurology, and continued on Plavix.  He completed 3 weeks of dual antiplatelet therapy following his stroke and therefore aspirin 81 mg was stopped.  He recently presented to the ER on 08/04/2018 following a fall.  He struck his head but denied other injuries.  Head CT was negative for acute abnormality.  He was discharged back to the facility without further intervention.  He was seen in our office in Sept for bradycardia.  He wore a Zio patch and there was no evidence of atrial fib.  There were no significant arrhythmias.  He returns for follow up.  Since I last saw him he was in the ED with a fall.  I reviewed these records for this visit.   He had a mechanical fall and there was no suggestion of syncope or arrhythmia.    Of note I did review records from October of last year.  He was seen by Dr. Burt Knack.  He was in the hospital had some chest discomfort.  He had a perfusion study which showed a moderate intensity fixed inferior defect consistent with scar.  He was managed conservatively.  At his nursing home because of his falls and fall risk he is required to use a wheelchair.  He supposed to get up with assistance only.  He has significant memory problems.  His daughter who is with him and his daughter is on the phone does not describe symptoms such as presyncope or syncope.  He is not describing any palpitations.  He has had no chest pressure, neck  or arm discomfort.  He has had no weight gain or edema.  They do think he has snoring and may be apnea.   Past Medical History:  Diagnosis Date  . Arthritis    "joints" (10/15/2017)  . Benign prostatic hyperplasia   . Complication of anesthesia    "last OR in 06/2017 affected him cognitively; hasn't got back to baseline yet" (10/15/2017)  . Dementia ([place])   . Depression   . GERD (gastroesophageal reflux disease)   . High cholesterol   . Hypertension   . Hypothyroidism   . Renal disease   . Renal disorder   . TIA (transient ischemic attack) early 2000s    Past Surgical History:  Procedure Laterality Date  . ABDOMINAL EXPLORATION SURGERY  7/  . APPENDECTOMY    . CATARACT EXTRACTION W/ INTRAOCULAR LENS  IMPLANT, BILATERAL Bilateral   . COLECTOMY  1950s   "thought he had iteilis"  . ESOPHAGOGASTRODUODENOSCOPY N/A 10/22/2017   Procedure: ESOPHAGOGASTRODUODENOSCOPY (EGD);  Surgeon: Gatha Mayer, MD;  Location: [facility] ENDOSCOPY;  Service: Endoscopy;  Laterality: N/A;  . ESOPHAGOGASTRODUODENOSCOPY (EGD) WITH PROPOFOL N/A 10/16/2017   Procedure: ESOPHAGOGASTRODUODENOSCOPY (EGD) WITH PROPOFOL;  Surgeon: Milus Banister, MD;  Location: [facility] ENDOSCOPY;  Service: Endoscopy;  Laterality: N/A;  . FEMUR FRACTURE SURGERY Right 1950s  . FRACTURE  SURGERY    . KNEE ARTHROSCOPY Left   . POSTERIOR LUMBAR FUSION    . ROTATOR CUFF REPAIR Left   . TONSILLECTOMY       Current Outpatient Medications  Medication Sig Dispense Refill  . acetaminophen (TYLENOL) 325 MG tablet Take 650 mg by mouth every 6 (six) hours as needed for mild pain.    Marland Kitchen clopidogrel (PLAVIX) 75 MG tablet Take 1 tablet (75 mg total) by mouth daily. 30 tablet 0  . COMBIVENT RESPIMAT 20-100 MCG/ACT AERS respimat Inhale 1 puff into the lungs 4 (four) times daily.     . cyanocobalamin (,VITAMIN B-12,) 1000 MCG/ML injection Inject 1,000 mcg into the muscle every 30 (thirty) days.    Marland Kitchen escitalopram (LEXAPRO) 5 MG tablet Take 1 tablet by  mouth daily.    . hydrALAZINE (APRESOLINE) 25 MG tablet Take 1 tablet (25 mg total) by mouth 2 (two) times daily. 60 tablet 3  . levothyroxine (SYNTHROID, LEVOTHROID) 50 MCG tablet Take 1 tablet (50 mcg total) by mouth every morning. (Patient taking differently: Take 50 mcg by mouth daily before breakfast. ) 50 tablet 0  . loratadine (CLARITIN) 10 MG tablet Take 10 mg by mouth daily.    . Melatonin 3 MG TABS Take 3 mg by mouth at bedtime.     . pantoprazole (PROTONIX) 40 MG tablet Take 1 tablet (40 mg total) by mouth daily. 30 tablet 0  . pravastatin (PRAVACHOL) 10 MG tablet Take 1 tablet (10 mg total) by mouth daily. 90 tablet 4  . sertraline (ZOLOFT) 100 MG tablet Take 1 tablet by mouth daily.     No current facility-administered medications for this visit.     Allergies:   Lisinopril    ROS:  Please see the history of present illness.   Otherwise, review of systems are positive for none.   All other systems are reviewed and negative.    PHYSICAL EXAM: VS:  BP (!) 187/81   Pulse (!) 48   Ht 5' 6"  (1.676 m)   Wt 174 lb 9.6 oz (79.2 kg)   BMI 28.18 kg/m  , BMI Body mass index is 28.18 kg/m. PHYSICAL EXAM GEN: Somewhat frail appearing but in no distress NECK:  No jugular venous distention at 90 degrees, waveform within normal limits, carotid upstroke brisk and symmetric, no bruits, no thyromegaly LYMPHATICS:  No cervical adenopathy LUNGS:  Clear to auscultation bilaterally BACK:  No CVA tenderness CHEST:  Unremarkable HEART:  S1 and S2 within normal limits, no S3, no S4, no clicks, no rubs, no murmurs ABD:  Positive bowel sounds normal in frequency in pitch, no bruits, no rebound, no guarding, unable to assess midline mass or bruit with the patient seated. EXT:  2 plus pulses throughout, moderate edema left leg, no cyanosis no clubbing SKIN:  No rashes no nodules NEURO:  Cranial nerves II through XII grossly intact, motor grossly intact throughout PSYCH:  Cognitively with  short-term memory deficits.   EKG:  EKG is not ordered today.   Recent Labs: 04/04/2018: TSH 4.601 04/07/2018: ALT 15 09/08/2018: NT-Pro BNP 116 10/05/2018: BUN 12; Creatinine, Ser 0.96; Hemoglobin 11.0; Platelets 392; Potassium 3.5; Sodium 140    Lipid Panel    Component Value Date/Time   CHOL 155 04/04/2018 0608   TRIG 276 (H) 04/04/2018 0608   HDL 30 (L) 04/04/2018 0608   CHOLHDL 5.2 04/04/2018 0608   VLDL 55 (H) 04/04/2018 0608   LDLCALC 70 04/04/2018 9675  Wt Readings from Last 3 Encounters:  10/28/18 174 lb 9.6 oz (79.2 kg)  09/20/18 174 lb 12.8 oz (79.3 kg)  09/08/18 172 lb 6.4 oz (78.2 kg)      Other studies Reviewed: Additional studies/ records that were reviewed today include: Hospital records, labs. Review of the above records demonstrates:  Please see elsewhere in the note.     ASSESSMENT AND PLAN:   Irregular heart beat -  He has no symptoms of presyncope or syncope.  He did have some bradycardia arrhythmia no evidence of atrial fibrillation.  At this point conservative therapy and no change in medications as planned.  His family will let me know if he ever complains of palpitations or if he has any presyncope or syncope.   Leg edema, left -  He had a Doppler and there was no evidence of DVT.  No change in therapy.  Essential hypertension, benign His blood pressure is elevated today.  I given written instructions for him to have twice daily blood pressure readings sent to me.  He might need adjustment.  He has been taken off of HCTZ in the past because of incontinence.  He has been taken off of Norvasc because of leg swelling.  I might restart this however depending on his blood pressure diary.  They say his systolics in the 242A at the nursing home but I need more frequent data.   Cerebrovascular accident (CVA) due to thrombosis of right middle cerebral artery ([place]) He remains on Plavix.  There is no evidence of atrial fibrillation.  He would be at high  risk for anticoagulation so I will not pursue an aggressive diagnosis of this.   Dyspnea on exertion, fatigue, low stamina BNP was not elevated.   I do not hear any left-sided heart failure.  No change in therapy.   Current medicines are reviewed at length with the patient today.  The patient does not have concerns regarding medicines.  The following changes have been made:  no change  Labs/ tests ordered today include: None No orders of the defined types were placed in this encounter.    Disposition:   FU with me in 12 months.     Signed, Minus Breeding, MD  10/28/2018 12:35 PM    [facility]

## 2018-10-28 ENCOUNTER — Ambulatory Visit (INDEPENDENT_AMBULATORY_CARE_PROVIDER_SITE_OTHER): Payer: Medicare Other | Admitting: Cardiology

## 2018-10-28 ENCOUNTER — Telehealth: Payer: Self-pay | Admitting: Cardiology

## 2018-10-28 ENCOUNTER — Encounter: Payer: Self-pay | Admitting: Cardiology

## 2018-10-28 VITALS — BP 187/81 | HR 48 | Ht 66.0 in | Wt 174.6 lb

## 2018-10-28 DIAGNOSIS — I639 Cerebral infarction, unspecified: Secondary | ICD-10-CM | POA: Diagnosis not present

## 2018-10-28 DIAGNOSIS — R0602 Shortness of breath: Secondary | ICD-10-CM

## 2018-10-28 DIAGNOSIS — I10 Essential (primary) hypertension: Secondary | ICD-10-CM

## 2018-10-28 DIAGNOSIS — M7989 Other specified soft tissue disorders: Secondary | ICD-10-CM

## 2018-10-28 DIAGNOSIS — R001 Bradycardia, unspecified: Secondary | ICD-10-CM | POA: Diagnosis not present

## 2018-10-28 NOTE — Telephone Encounter (Signed)
Patient was seen today by Dr. Percival Spanish. Katlyn from [facility] needs order clarified. She needs to know the times of day to check the patient's BP and the parameter of BP when they should call the office. The order only stated to check BP 2 times a day for 2 weeks.

## 2018-10-28 NOTE — Telephone Encounter (Signed)
Returned call to [facility] [place]. She states that she needs a written order faxed to [phone]. She needs when we want her to have the BP taken (like take BP at 8am and after medication, or take BP after dinner)

## 2018-10-28 NOTE — Patient Instructions (Signed)
Medication Instructions:  Continue current medications  If you need a refill on your cardiac medications before your next appointment, please call your pharmacy.  Labwork: None Ordered   If you have labs (blood work) drawn today and your tests are completely normal, you will receive your results only by: Marland Kitchen MyChart Message (if you have MyChart) OR . A paper copy in the mail If you have any lab test that is abnormal or we need to change your treatment, we will call you to review the results.  Testing/Procedures: None Ordered  Special Instructions: Please have patient take his blood pressure twice a day for 2 weeks.   Follow-Up: You will need a follow up appointment in 1 Year.  Please call our office 2 months in [phone]) to schedule the (1 Year) appointment.  You may see  DR Percival Spanish or one of the following Advanced Practice Providers on your designated Care Team:   . Jory Sims, DNP, ANP . Rhonda Barrett, PA-C .  Marland Kitchen Kerin Ransom, PA-C . Daleen Snook Kroeger, PA-C . Sande Rives, PA-C .  Marland Kitchen Almyra Deforest, PA-C . Fabian Sharp, PA-C  At [facility], you and your health needs are our priority.  As part of our continuing mission to provide you with exceptional heart care, we have created designated Provider Care Teams.  These Care Teams include your primary Cardiologist (physician) and Advanced Practice Providers (APPs -  Physician Assistants and Nurse Practitioners) who all work together to provide you with the care you need, when you need it.   Thank you for choosing [facility] at [facility]!!

## 2018-10-30 NOTE — Telephone Encounter (Signed)
Take BP bid and record readings.  Send two weeks of readings to me.  Record the date and time.  Time of the recordings can vary.

## 2018-11-01 NOTE — Telephone Encounter (Signed)
Dr Percival Spanish recommendation faxed to [facility]

## 2018-11-02 ENCOUNTER — Emergency Department (HOSPITAL_COMMUNITY)
Admission: EM | Admit: 2018-11-02 | Discharge: 2018-11-02 | Disposition: A | Discharge status: Home / Self Care | Payer: Medicare Other | Attending: Emergency Medicine | Admitting: Emergency Medicine

## 2018-11-02 ENCOUNTER — Emergency Department (HOSPITAL_COMMUNITY): Payer: Medicare Other

## 2018-11-02 ENCOUNTER — Encounter (HOSPITAL_COMMUNITY): Payer: Self-pay | Admitting: Emergency Medicine

## 2018-11-02 DIAGNOSIS — W19XXXA Unspecified fall, initial encounter: Secondary | ICD-10-CM | POA: Insufficient documentation

## 2018-11-02 DIAGNOSIS — I129 Hypertensive chronic kidney disease with stage 1 through stage 4 chronic kidney disease, or unspecified chronic kidney disease: Secondary | ICD-10-CM | POA: Insufficient documentation

## 2018-11-02 DIAGNOSIS — Z7901 Long term (current) use of anticoagulants: Secondary | ICD-10-CM | POA: Insufficient documentation

## 2018-11-02 DIAGNOSIS — F0390 Unspecified dementia without behavioral disturbance: Secondary | ICD-10-CM | POA: Insufficient documentation

## 2018-11-02 DIAGNOSIS — Y999 Unspecified external cause status: Secondary | ICD-10-CM | POA: Diagnosis not present

## 2018-11-02 DIAGNOSIS — Z79899 Other long term (current) drug therapy: Secondary | ICD-10-CM | POA: Diagnosis not present

## 2018-11-02 DIAGNOSIS — S0181XA Laceration without foreign body of other part of head, initial encounter: Secondary | ICD-10-CM

## 2018-11-02 DIAGNOSIS — Y92129 Unspecified place in nursing home as the place of occurrence of the external cause: Secondary | ICD-10-CM | POA: Insufficient documentation

## 2018-11-02 DIAGNOSIS — Z87891 Personal history of nicotine dependence: Secondary | ICD-10-CM | POA: Diagnosis not present

## 2018-11-02 DIAGNOSIS — Y939 Activity, unspecified: Secondary | ICD-10-CM | POA: Diagnosis not present

## 2018-11-02 DIAGNOSIS — S0990XA Unspecified injury of head, initial encounter: Secondary | ICD-10-CM | POA: Diagnosis present

## 2018-11-02 DIAGNOSIS — N183 Chronic kidney disease, stage 3 (moderate): Secondary | ICD-10-CM | POA: Insufficient documentation

## 2018-11-02 DIAGNOSIS — E039 Hypothyroidism, unspecified: Secondary | ICD-10-CM | POA: Diagnosis not present

## 2018-11-02 DIAGNOSIS — S01112A Laceration without foreign body of left eyelid and periocular area, initial encounter: Secondary | ICD-10-CM | POA: Insufficient documentation

## 2018-11-02 LAB — COMPREHENSIVE METABOLIC PANEL
ALBUMIN: 4 g/dL (ref 3.5–5.0)
ALT: 25 U/L (ref 0–44)
AST: 30 U/L (ref 15–41)
Alkaline Phosphatase: 107 U/L (ref 38–126)
Anion gap: 8 (ref 5–15)
BUN: 19 mg/dL (ref 8–23)
CHLORIDE: 106 mmol/L (ref 98–111)
CO2: 24 mmol/L (ref 22–32)
Calcium: 9.4 mg/dL (ref 8.9–10.3)
Creatinine, Ser: 1.06 mg/dL (ref 0.61–1.24)
GFR calc Af Amer: >60 mL/min (ref >60)
GFR calc non Af Amer: >60 mL/min (ref >60)
GLUCOSE: 100 mg/dL — AB (ref 70–99)
POTASSIUM: 3.7 mmol/L (ref 3.5–5.1)
Sodium: 138 mmol/L (ref 135–145)
Total Bilirubin: 1 mg/dL (ref 0.3–1.2)
Total Protein: 7.1 g/dL (ref 6.5–8.1)

## 2018-11-02 LAB — CBC WITH DIFFERENTIAL/PLATELET
Abs Immature Granulocytes: 0.02 10*3/uL (ref 0.00–0.07)
BASOS ABS: 0 10*3/uL (ref 0.0–0.1)
BASOS PCT: 0 %
EOS ABS: 0.2 10*3/uL (ref 0.0–0.5)
Eosinophils Relative: 2 %
HCT: 41.5 % (ref 39.0–52.0)
Hemoglobin: 13.3 g/dL (ref 13.0–17.0)
IMMATURE GRANULOCYTES: 0 %
LYMPHS ABS: 0.9 10*3/uL (ref 0.7–4.0)
Lymphocytes Relative: 10 %
MCH: 28.5 pg (ref 26.0–34.0)
MCHC: 32 g/dL (ref 30.0–36.0)
MCV: 88.9 fL (ref 80.0–100.0)
Monocytes Absolute: 0.8 10*3/uL (ref 0.1–1.0)
Monocytes Relative: 9 %
NEUTROS ABS: 7.3 10*3/uL (ref 1.7–7.7)
NEUTROS PCT: 79 %
NRBC: 0 % (ref 0.0–0.2)
PLATELETS: 341 10*3/uL (ref 150–400)
RBC: 4.67 MIL/uL (ref 4.22–5.81)
RDW: 14.5 % (ref 11.5–15.5)
WBC: 9.2 10*3/uL (ref 4.0–10.5)

## 2018-11-02 LAB — URINALYSIS, ROUTINE W REFLEX MICROSCOPIC
BACTERIA UA: NONE SEEN
Bilirubin Urine: NEGATIVE
GLUCOSE, UA: NEGATIVE mg/dL
Ketones, ur: NEGATIVE mg/dL
Leukocytes, UA: NEGATIVE
Nitrite: NEGATIVE
PH: 5 (ref 5.0–8.0)
Protein, ur: 100 mg/dL — AB
SPECIFIC GRAVITY, URINE: 1.013 (ref 1.005–1.030)

## 2018-11-02 LAB — I-STAT TROPONIN, ED: Troponin i, poc: 0.02 ng/mL (ref 0.00–0.08)

## 2018-11-02 LAB — I-STAT CG4 LACTIC ACID, ED: Lactic Acid, Venous: 0.91 mmol/L (ref 0.5–1.9)

## 2018-11-02 LAB — CK: Total CK: 148 U/L (ref 49–397)

## 2018-11-02 MED ORDER — TETANUS-DIPHTH-ACELL PERTUSSIS 5-2.5-18.5 LF-MCG/0.5 IM SUSP: 0.5000 mL | Freq: Once | INTRAMUSCULAR | Status: DC

## 2018-11-02 NOTE — ED Notes (Signed)
Condom catheter placed on pt. Pt informed of need for urine sample. Laceration tray at bedside.

## 2018-11-02 NOTE — ED Notes (Signed)
Returns from CT

## 2018-11-02 NOTE — ED Notes (Signed)
Patient transported to CT on monitor

## 2018-11-02 NOTE — ED Triage Notes (Signed)
PT arrives via [facility] from [facility]. PT was found in the bathroom post fall. PT has a laceration above left eyebrow and surrounding swelling. PT does not remember the fall per EMS. Last memory is of yesterday afternoon. PT is oriented to self, place, and situation. Disoriented to time. PT takes plavix.

## 2018-11-02 NOTE — ED Notes (Signed)
Patient transported to CT 

## 2018-11-02 NOTE — ED Provider Notes (Signed)
[facility] EMERGENCY DEPARTMENT Provider Note   CSN: [id number] Arrival date & time: 11/02/18  4166     History   Chief Complaint Chief Complaint  Patient presents with  . Fall    HPI Anthony Hayes is a 83 y.o. male.  HPI   83 year old male with a history of hypertension, hypercholesterolemia, hypothyroidism, dementia, presents with concern for fall from the [facility] assisted living facility.  Patient does not remember the fall, how it happened or when. Reports headache. Denies other symptoms, including no numbness, weakness, chest pain, dyspnea, abdominal pain, fever, urinary symptoms.  EMS reports patient is normally alert and oriented, walks with walker.  Facility reports to me they are trying to keep him in wheelchair because of frequent falls.  Doesn't remember to call for help. Doesn't remember to push button to get help to get up. Frequently does not remember falls and is not typically oriented to time. No other concerns.   Family reports patient at baseline.  Past Medical History:  Diagnosis Date  . Arthritis    "joints" (10/15/2017)  . Benign prostatic hyperplasia   . Complication of anesthesia    "last OR in 06/2017 affected him cognitively; hasn't got back to baseline yet" (10/15/2017)  . Dementia ([place])   . Depression   . GERD (gastroesophageal reflux disease)   . High cholesterol   . Hypertension   . Hypothyroidism   . Renal disease   . Renal disorder   . TIA (transient ischemic attack) early 2000s    Patient Active Problem List   Diagnosis Date Noted  . Bradycardia 10/28/2018  . Leg swelling 10/28/2018  . Irregular heart beat 09/08/2018  . Sleep disturbance   . Stage 3 chronic kidney disease ([place])   . Benign essential HTN   . Hypokalemia   . Hyponatremia   . Infarction of right basal ganglia ([place]) 04/06/2018  . Pressure injury of skin 04/06/2018  . Vitamin B12 deficiency   . Hypothyroidism   . Crohn's disease with complication  ([place])   . Benign prostatic hyperplasia   . Acute lower UTI   . Dementia without behavioral disturbance ([place])   . Acute ischemic stroke ([place]) 04/05/2018  . Leukocytosis   . Shortness of breath   . Acute encephalopathy   . CVA (cerebral vascular accident) ([place]) 04/03/2018  . Calculus, kidney 01/21/2018  . Acute blood loss anemia   . Mallory-Weiss syndrome   . Esophageal stricture   . Respiratory failure ([place])   . UGIB (upper gastrointestinal bleed) 10/16/2017  . GERD (gastroesophageal reflux disease) 10/16/2017  . Essential hypertension, benign 10/16/2017  . Chest pain 10/15/2017  . Hypothyroidism (acquired) 06/08/2017  . Chronic kidney disease, stage III (moderate) ([place]) 03/19/2016  . Dyslipidemia 03/19/2016  . Degeneration of lumbar or lumbosacral intervertebral disc 07/10/2008  . Arthropathy, lower leg 05/23/2004    Past Surgical History:  Procedure Laterality Date  . ABDOMINAL EXPLORATION SURGERY  7/  . APPENDECTOMY    . CATARACT EXTRACTION W/ INTRAOCULAR LENS  IMPLANT, BILATERAL Bilateral   . COLECTOMY  1950s   "thought he had iteilis"  . ESOPHAGOGASTRODUODENOSCOPY N/A 10/22/2017   Procedure: ESOPHAGOGASTRODUODENOSCOPY (EGD);  Surgeon: Gatha Mayer, MD;  Location: [facility] ENDOSCOPY;  Service: Endoscopy;  Laterality: N/A;  . ESOPHAGOGASTRODUODENOSCOPY (EGD) WITH PROPOFOL N/A 10/16/2017   Procedure: ESOPHAGOGASTRODUODENOSCOPY (EGD) WITH PROPOFOL;  Surgeon: Milus Banister, MD;  Location: [facility] ENDOSCOPY;  Service: Endoscopy;  Laterality: N/A;  . FEMUR FRACTURE SURGERY Right 1950s  .  FRACTURE SURGERY    . KNEE ARTHROSCOPY Left   . POSTERIOR LUMBAR FUSION    . ROTATOR CUFF REPAIR Left   . TONSILLECTOMY          Home Medications    Prior to Admission medications   Medication Sig Start Date End Date Taking? Authorizing Provider  acetaminophen (TYLENOL) 325 MG tablet Take 650 mg by mouth every 6 (six) hours as needed for mild pain.    [provider]  clopidogrel  (PLAVIX) 75 MG tablet Take 1 tablet (75 mg total) by mouth daily. 04/06/18   Dhungel, Nishant, MD  COMBIVENT RESPIMAT 20-100 MCG/ACT AERS respimat Inhale 1 puff into the lungs 4 (four) times daily.  06/01/18   [provider]  cyanocobalamin (,VITAMIN B-12,) 1000 MCG/ML injection Inject 1,000 mcg into the muscle every 30 (thirty) days.    [provider]  escitalopram (LEXAPRO) 5 MG tablet Take 1 tablet by mouth daily. 08/24/18   [provider]  hydrALAZINE (APRESOLINE) 25 MG tablet Take 1 tablet (25 mg total) by mouth 2 (two) times daily. 09/08/18 01/06/19  Duke, Tami Lin, PA  levothyroxine (SYNTHROID, LEVOTHROID) 50 MCG tablet Take 1 tablet (50 mcg total) by mouth every morning. Patient taking differently: Take 50 mcg by mouth daily before breakfast.  04/05/18   Dhungel, Flonnie Overman, MD  loratadine (CLARITIN) 10 MG tablet Take 10 mg by mouth daily.    [provider]  Melatonin 3 MG TABS Take 3 mg by mouth at bedtime.     [provider]  pantoprazole (PROTONIX) 40 MG tablet Take 1 tablet (40 mg total) by mouth daily. 04/05/18   Dhungel, Flonnie Overman, MD  pravastatin (PRAVACHOL) 10 MG tablet Take 1 tablet (10 mg total) by mouth daily. 06/06/18   Venancio Poisson, NP  sertraline (ZOLOFT) 100 MG tablet Take 1 tablet by mouth daily. 08/17/18   [provider]    Family History Family History  Problem Relation Age of Onset  . Hypertension Father     Social History Social History   Tobacco Use  . Smoking status: Former Smoker    Years: 7.00    Types: Cigarettes    Last attempt to quit: 1963    Years since quitting: 56.8  . Smokeless tobacco: Never Used  Substance Use Topics  . Alcohol use: Yes    Comment: 10/15/2017 "a few drinks/year"  . Drug use: No     Allergies   Lisinopril   Review of Systems Review of Systems  Constitutional: Negative for fever.  HENT: Negative for sore throat.   Eyes: Negative for visual disturbance.    Respiratory: Negative for cough and shortness of breath.   Cardiovascular: Negative for chest pain.  Gastrointestinal: Negative for abdominal pain, nausea and vomiting.  Genitourinary: Negative for dysuria.  Musculoskeletal: Negative for arthralgias, back pain and neck pain.  Skin: Positive for wound. Negative for rash.  Neurological: Positive for headaches. Negative for syncope.     Physical Exam Updated Vital Signs BP (!) 176/86   Pulse (!) 50   Temp 98.7 F (37.1 C) (Oral)   Resp 16   SpO2 98%   Physical Exam  Constitutional: He appears well-developed and well-nourished. No distress.  HENT:  Head: Normocephalic.  Left eye with periorbital hematoma, superficial laceration left eyebrow 2cm  Eyes: Conjunctivae and EOM are normal.  Neck: Normal range of motion.  Cardiovascular: Regular rhythm, normal heart sounds and intact distal pulses. Bradycardia present. Exam reveals no gallop and no  friction rub.  No murmur heard. Pulmonary/Chest: Effort normal and breath sounds normal. No respiratory distress. He has no wheezes. He has no rales.  Abdominal: Soft. He exhibits no distension. There is no tenderness. There is no guarding.  Musculoskeletal: He exhibits no edema.  Neurological: He is alert. GCS eye subscore is 4. GCS verbal subscore is 5. GCS motor subscore is 6.        Skin: Skin is warm and dry. He is not diaphoretic.  Nursing note and vitals reviewed.    ED Treatments / Results  Labs (all labs ordered are listed, but only abnormal results are displayed) Labs Reviewed  COMPREHENSIVE METABOLIC PANEL - Abnormal; Notable for the following components:      Result Value   Glucose, Bld 100 (*)    All other components within normal limits  URINALYSIS, ROUTINE W REFLEX MICROSCOPIC - Abnormal; Notable for the following components:   Color, Urine STRAW (*)    Hgb urine dipstick SMALL (*)    Protein, ur 100 (*)    All other components within normal limits  URINE  CULTURE  CBC WITH DIFFERENTIAL/PLATELET  CK  I-STAT TROPONIN, ED  I-STAT CG4 LACTIC ACID, ED    EKG EKG Interpretation  Date/Time:  Wednesday November 02 2018 07:38:27 EST Ventricular Rate:  46 PR Interval:    QRS Duration: 154 QT Interval:  603 QTC Calculation: 528 R Axis:   -78 Text Interpretation:  Sinus bradycardia Right bundle branch block LVH with IVCD and secondary repol abnrm Prolonged QT interval No significant change since last tracing Confirmed by Gareth Morgan [phone]) on 11/02/2018 8:08:46 AM   Radiology Ct Head Wo Contrast  Result Date: 11/02/2018 CLINICAL DATA:  Fall in bathroom. Laceration and soft tissue swelling above the left orbit. Patient is amnestic to the event. Patient is on Plavix. EXAM: CT HEAD WITHOUT CONTRAST CT CERVICAL SPINE WITHOUT CONTRAST TECHNIQUE: Multidetector CT imaging of the head and cervical spine was performed following the standard protocol without intravenous contrast. Multiplanar CT image reconstructions of the cervical spine were also generated. COMPARISON:  None. FINDINGS: CT HEAD FINDINGS Brain: Advanced atrophy and diffuse white matter disease are again noted. No acute infarct, hemorrhage, or mass lesion is present. The ventricles are stable and proportionate to the degree of atrophy. Remote lacunar infarcts of the basal ganglia are stable. Brainstem and cerebellum are unchanged. No significant extra-axial collection or hemorrhage is present. Vascular: The cavernous mild atherosclerotic calcifications internal carotid are again seen within arteries. There is no hyperdense vessel. Skull: Calvarium is intact. Left supraorbital scalp soft tissue swelling and hematoma is present. There is no radiopaque foreign body. Laceration is noted. There is no underlying fracture. Sinuses/Orbits: The paranasal sinuses and mastoid air cells are clear. Bilateral lens replacements are present. Globes and orbits are otherwise within normal limits. CT CERVICAL SPINE  FINDINGS Alignment: AP alignment is anatomic. There is no significant interval change. Skull base and vertebrae: Craniocervical junction is normal. Vertebral body heights are normal. No acute or healing fracture is present. Soft tissues and spinal canal: The paraspinous soft tissues are within normal limits. Atherosclerotic calcifications are present at the aortic arch. There is no significant calcification at the carotid bifurcations. Disc levels:  Multilevel facet degenerative changes are stable. Upper chest: Lung apices are clear. IMPRESSION: 1. Left supraorbital scalp laceration and hematoma without underlying fracture. 2. No globe injury is evident. 3. Stable advanced atrophy and white matter disease. 4. No acute intracranial abnormality. 5. Stable multilevel degenerative changes  in the cervical spine without acute fracture or traumatic subluxation. Electronically Signed   By: [name] M.D.   On: 11/02/2018 08:40   Ct Cervical Spine Wo Contrast  Result Date: 11/02/2018 CLINICAL DATA:  Fall in bathroom. Laceration and soft tissue swelling above the left orbit. Patient is amnestic to the event. Patient is on Plavix. EXAM: CT HEAD WITHOUT CONTRAST CT CERVICAL SPINE WITHOUT CONTRAST TECHNIQUE: Multidetector CT imaging of the head and cervical spine was performed following the standard protocol without intravenous contrast. Multiplanar CT image reconstructions of the cervical spine were also generated. COMPARISON:  None. FINDINGS: CT HEAD FINDINGS Brain: Advanced atrophy and diffuse white matter disease are again noted. No acute infarct, hemorrhage, or mass lesion is present. The ventricles are stable and proportionate to the degree of atrophy. Remote lacunar infarcts of the basal ganglia are stable. Brainstem and cerebellum are unchanged. No significant extra-axial collection or hemorrhage is present. Vascular: The cavernous mild atherosclerotic calcifications internal carotid are again seen within  arteries. There is no hyperdense vessel. Skull: Calvarium is intact. Left supraorbital scalp soft tissue swelling and hematoma is present. There is no radiopaque foreign body. Laceration is noted. There is no underlying fracture. Sinuses/Orbits: The paranasal sinuses and mastoid air cells are clear. Bilateral lens replacements are present. Globes and orbits are otherwise within normal limits. CT CERVICAL SPINE FINDINGS Alignment: AP alignment is anatomic. There is no significant interval change. Skull base and vertebrae: Craniocervical junction is normal. Vertebral body heights are normal. No acute or healing fracture is present. Soft tissues and spinal canal: The paraspinous soft tissues are within normal limits. Atherosclerotic calcifications are present at the aortic arch. There is no significant calcification at the carotid bifurcations. Disc levels:  Multilevel facet degenerative changes are stable. Upper chest: Lung apices are clear. IMPRESSION: 1. Left supraorbital scalp laceration and hematoma without underlying fracture. 2. No globe injury is evident. 3. Stable advanced atrophy and white matter disease. 4. No acute intracranial abnormality. 5. Stable multilevel degenerative changes in the cervical spine without acute fracture or traumatic subluxation. Electronically Signed   By: [name] M.D.   On: 11/02/2018 08:40    Procedures Procedures (including critical care time)  Medications Ordered in ED Medications - No data to display   Initial Impression / Assessment and Plan / ED Course  I have reviewed the triage vital signs and the nursing notes.  Pertinent labs & imaging results that were available during my care of the patient were reviewed by me and considered in my medical decision making (see chart for details).     83 year old male with a history of hypertension, hypercholesterolemia, hypothyroidism, dementia, presents with concern for fall from the [facility] facility.    CT head and cervical spine without injuries.  EKG shows sinus bradycardia, unchanged from previous.  Initially unclear from EMS report if patient was at baseline, ordered labs which show no significant abnormalities. Normal lactic acid.  Urinalysis shows no infection.  Family arrives and discussed with facility and feel he is at baseline. Wound cleaned, steri strips placed, very superficial eyebrow laceration appropriate for healing by secondary intention. Patient stable for discharge back to facility.  Final Clinical Impressions(s) / ED Diagnoses   Final diagnoses:  Fall, initial encounter  Facial laceration, initial encounter    ED Discharge Orders    None       Gareth Morgan, MD 11/02/18 2049

## 2018-11-03 LAB — URINE CULTURE: Culture: <10000 — AB

## 2018-11-11 ENCOUNTER — Telehealth: Payer: Self-pay | Admitting: Cardiology

## 2018-11-11 NOTE — Telephone Encounter (Signed)
Per call parameters if bp is 190/90 now 200/106  Katie on the line

## 2018-11-11 NOTE — Telephone Encounter (Signed)
Returned call to [employer] with [facility].Raquel's advice given.Order faxed to her at fax # [phone].

## 2018-11-11 NOTE — Telephone Encounter (Signed)
Please give an additional hydralazine 55m NOW, then increase dose to 242mTID.  Monitor BP twice daily for the next week. Call back if BP remains above 17136ystolic after 1 week

## 2018-11-11 NOTE — Telephone Encounter (Signed)
Returned call to [employer] with [facility].She was calling to report patient's B/P at present 200/106 pulse 57.B/P yesterday 180/80.Advised I will send message to our pharmacist for advice.

## 2018-11-21 ENCOUNTER — Other Ambulatory Visit: Payer: Self-pay

## 2018-11-21 ENCOUNTER — Inpatient Hospital Stay (HOSPITAL_COMMUNITY)
Admission: EM | Admit: 2018-11-21 | Discharge: 2018-11-25 | DRG: 871 | Disposition: A | Discharge status: Skilled Nursing Facility | Payer: Medicare Other | Attending: Internal Medicine | Admitting: Internal Medicine

## 2018-11-21 ENCOUNTER — Encounter (HOSPITAL_COMMUNITY): Payer: Self-pay | Admitting: *Deleted

## 2018-11-21 ENCOUNTER — Emergency Department (HOSPITAL_COMMUNITY): Payer: Medicare Other

## 2018-11-21 DIAGNOSIS — A419 Sepsis, unspecified organism: Secondary | ICD-10-CM | POA: Diagnosis not present

## 2018-11-21 DIAGNOSIS — I10 Essential (primary) hypertension: Secondary | ICD-10-CM | POA: Diagnosis present

## 2018-11-21 DIAGNOSIS — I5032 Chronic diastolic (congestive) heart failure: Secondary | ICD-10-CM | POA: Diagnosis present

## 2018-11-21 DIAGNOSIS — J189 Pneumonia, unspecified organism: Secondary | ICD-10-CM | POA: Diagnosis not present

## 2018-11-21 DIAGNOSIS — W19XXXA Unspecified fall, initial encounter: Secondary | ICD-10-CM

## 2018-11-21 DIAGNOSIS — E7800 Pure hypercholesterolemia, unspecified: Secondary | ICD-10-CM | POA: Diagnosis present

## 2018-11-21 DIAGNOSIS — R0902 Hypoxemia: Secondary | ICD-10-CM

## 2018-11-21 DIAGNOSIS — N183 Chronic kidney disease, stage 3 unspecified: Secondary | ICD-10-CM | POA: Diagnosis present

## 2018-11-21 DIAGNOSIS — R52 Pain, unspecified: Secondary | ICD-10-CM

## 2018-11-21 DIAGNOSIS — Z87891 Personal history of nicotine dependence: Secondary | ICD-10-CM

## 2018-11-21 DIAGNOSIS — R062 Wheezing: Secondary | ICD-10-CM

## 2018-11-21 DIAGNOSIS — E785 Hyperlipidemia, unspecified: Secondary | ICD-10-CM | POA: Diagnosis present

## 2018-11-21 DIAGNOSIS — E872 Acidosis: Secondary | ICD-10-CM | POA: Diagnosis present

## 2018-11-21 DIAGNOSIS — Z7902 Long term (current) use of antithrombotics/antiplatelets: Secondary | ICD-10-CM

## 2018-11-21 DIAGNOSIS — R509 Fever, unspecified: Secondary | ICD-10-CM

## 2018-11-21 DIAGNOSIS — E876 Hypokalemia: Secondary | ICD-10-CM | POA: Diagnosis present

## 2018-11-21 DIAGNOSIS — F0390 Unspecified dementia without behavioral disturbance: Secondary | ICD-10-CM | POA: Diagnosis present

## 2018-11-21 DIAGNOSIS — F329 Major depressive disorder, single episode, unspecified: Secondary | ICD-10-CM | POA: Diagnosis present

## 2018-11-21 DIAGNOSIS — I639 Cerebral infarction, unspecified: Secondary | ICD-10-CM | POA: Diagnosis present

## 2018-11-21 DIAGNOSIS — Z888 Allergy status to other drugs, medicaments and biological substances status: Secondary | ICD-10-CM

## 2018-11-21 DIAGNOSIS — I130 Hypertensive heart and chronic kidney disease with heart failure and stage 1 through stage 4 chronic kidney disease, or unspecified chronic kidney disease: Secondary | ICD-10-CM | POA: Diagnosis present

## 2018-11-21 DIAGNOSIS — Z79899 Other long term (current) drug therapy: Secondary | ICD-10-CM

## 2018-11-21 DIAGNOSIS — Z7951 Long term (current) use of inhaled steroids: Secondary | ICD-10-CM

## 2018-11-21 DIAGNOSIS — Z8673 Personal history of transient ischemic attack (TIA), and cerebral infarction without residual deficits: Secondary | ICD-10-CM

## 2018-11-21 DIAGNOSIS — J988 Other specified respiratory disorders: Secondary | ICD-10-CM

## 2018-11-21 DIAGNOSIS — K219 Gastro-esophageal reflux disease without esophagitis: Secondary | ICD-10-CM | POA: Diagnosis present

## 2018-11-21 DIAGNOSIS — N400 Enlarged prostate without lower urinary tract symptoms: Secondary | ICD-10-CM | POA: Diagnosis present

## 2018-11-21 DIAGNOSIS — Z981 Arthrodesis status: Secondary | ICD-10-CM

## 2018-11-21 DIAGNOSIS — E039 Hypothyroidism, unspecified: Secondary | ICD-10-CM | POA: Diagnosis present

## 2018-11-21 DIAGNOSIS — Y95 Nosocomial condition: Secondary | ICD-10-CM | POA: Diagnosis present

## 2018-11-21 LAB — I-STAT TROPONIN, ED: Troponin i, poc: 0.01 ng/mL (ref 0.00–0.08)

## 2018-11-21 LAB — BASIC METABOLIC PANEL
Anion gap: 12 (ref 5–15)
BUN: 18 mg/dL (ref 8–23)
CHLORIDE: 103 mmol/L (ref 98–111)
CO2: 22 mmol/L (ref 22–32)
CREATININE: 1.17 mg/dL (ref 0.61–1.24)
Calcium: 9.3 mg/dL (ref 8.9–10.3)
GFR calc Af Amer: >60 mL/min (ref >60)
GFR calc non Af Amer: 57 mL/min — ABNORMAL LOW (ref >60)
Glucose, Bld: 99 mg/dL (ref 70–99)
POTASSIUM: 3.3 mmol/L — AB (ref 3.5–5.1)
Sodium: 137 mmol/L (ref 135–145)

## 2018-11-21 LAB — CBC
HEMATOCRIT: 38.5 % — AB (ref 39.0–52.0)
HEMOGLOBIN: 12.4 g/dL — AB (ref 13.0–17.0)
MCH: 28.5 pg (ref 26.0–34.0)
MCHC: 32.2 g/dL (ref 30.0–36.0)
MCV: 88.5 fL (ref 80.0–100.0)
NRBC: 0 % (ref 0.0–0.2)
Platelets: 326 10*3/uL (ref 150–400)
RBC: 4.35 MIL/uL (ref 4.22–5.81)
RDW: 14.3 % (ref 11.5–15.5)
WBC: 13.6 10*3/uL — ABNORMAL HIGH (ref 4.0–10.5)

## 2018-11-21 LAB — BRAIN NATRIURETIC PEPTIDE: B NATRIURETIC PEPTIDE 5: 152.7 pg/mL — AB (ref 0.0–100.0)

## 2018-11-21 LAB — I-STAT CG4 LACTIC ACID, ED: Lactic Acid, Venous: 0.76 mmol/L (ref 0.5–1.9)

## 2018-11-21 MED ORDER — ALBUTEROL SULFATE (2.5 MG/3ML) 0.083% IN NEBU
5.0000 mg | INHALATION_SOLUTION | Freq: Once | RESPIRATORY_TRACT | Status: AC
  Administered 2018-11-21: 5 mg via RESPIRATORY_TRACT
  Filled 2018-11-21: qty 6

## 2018-11-21 MED ORDER — SODIUM CHLORIDE 0.9 % IV SOLN
2.0000 g | INTRAVENOUS | Status: DC
  Administered 2018-11-21: 2 g via INTRAVENOUS
  Filled 2018-11-21: qty 20

## 2018-11-21 MED ORDER — IPRATROPIUM BROMIDE 0.02 % IN SOLN
0.5000 mg | Freq: Once | RESPIRATORY_TRACT | Status: AC
  Administered 2018-11-21: 0.5 mg via RESPIRATORY_TRACT
  Filled 2018-11-21: qty 2.5

## 2018-11-21 MED ORDER — ACETAMINOPHEN 325 MG PO TABS
650.0000 mg | ORAL_TABLET | Freq: Once | ORAL | Status: AC
  Administered 2018-11-21: 650 mg via ORAL
  Filled 2018-11-21: qty 2

## 2018-11-21 MED ORDER — SODIUM CHLORIDE 0.9 % IV SOLN
500.0000 mg | INTRAVENOUS | Status: DC
  Administered 2018-11-22 – 2018-11-25 (×4): 500 mg via INTRAVENOUS
  Filled 2018-11-21 (×5): qty 500

## 2018-11-21 MED ORDER — SODIUM CHLORIDE 0.9 % IV SOLN: INTRAVENOUS | Status: DC | PRN

## 2018-11-21 NOTE — ED Notes (Signed)
Patient transported to X-ray 

## 2018-11-21 NOTE — H&P (Signed)
History and Physical    Anthony Hayes ZOX:[id number] DOB: [birth year] DOA: 11/21/2018  Referring MD/NP/PA:   PCP: Etter Sjogren, DO   Patient coming from:  The patient is coming from ALF  At baseline, pt is dependent for most of ADL.        Chief Complaint: Cough, fever, chest pain  HPI: Anthony Hayes is a 83 y.o. male with medical history significant of hypertension, hyperlipidemia, stroke, GERD, hypothyroidism, depression, dementia, CKD 3, who presents with cough, fever, chest pain.  Pt has dementia, and cannot provide accurate medical history.  I called her daughter at home, who provided most of the medical history.  Per her daughter, patient has been having cough, fever, chills in the past 5 days, which has worsened today.  Patient coughs up yellow-colored sputum.  Patient has some chest pain on coughing per her daughter.  Patient fell in this morning, but no significant injury noted.  Did not pass out.  Patient has chronic loose stool bowel movement, which is normal to patient.  Currently no nausea, vomiting or abdominal pain.  No symptoms of UTI.  He moves all extremities.  Per his daughter, patient's mental status is at baseline.  ED Course: pt was found to have WBC 13.0, BNP 152.7, lactic acid 0.76, electrolytes renal function okay, temperature 101.1, no tachycardia, has tachypnea, oxygen saturation 91 to 92% on room air.  CXR showed cardiomegaly with mild diffuse pulmonary interstitial change, possible left-sided infiltration? Patient is admitted to telemetry bed as inpatient.  Review of Systems: Could not be reviewed accurately due to dementia   Allergy:  Allergies  Allergen Reactions  . Lisinopril Swelling    Swelling of lips     Past Medical History:  Diagnosis Date  . Arthritis    "joints" (10/15/2017)  . Benign prostatic hyperplasia   . Complication of anesthesia    "last OR in 06/2017 affected him cognitively; hasn't got back to baseline yet" (10/15/2017)  . Dementia  ([place])   . Depression   . GERD (gastroesophageal reflux disease)   . High cholesterol   . Hypertension   . Hypothyroidism   . Renal disease   . Renal disorder   . TIA (transient ischemic attack) early 2000s    Past Surgical History:  Procedure Laterality Date  . ABDOMINAL EXPLORATION SURGERY  7/  . APPENDECTOMY    . CATARACT EXTRACTION W/ INTRAOCULAR LENS  IMPLANT, BILATERAL Bilateral   . COLECTOMY  1950s   "thought he had iteilis"  . ESOPHAGOGASTRODUODENOSCOPY N/A 10/22/2017   Procedure: ESOPHAGOGASTRODUODENOSCOPY (EGD);  Surgeon: Gatha Mayer, MD;  Location: [facility] ENDOSCOPY;  Service: Endoscopy;  Laterality: N/A;  . ESOPHAGOGASTRODUODENOSCOPY (EGD) WITH PROPOFOL N/A 10/16/2017   Procedure: ESOPHAGOGASTRODUODENOSCOPY (EGD) WITH PROPOFOL;  Surgeon: Milus Banister, MD;  Location[facility] ENDOSCOPY;  Service: Endoscopy;  Laterality: N/A;  . FEMUR FRACTURE SURGERY Right 1950s  . FRACTURE SURGERY    . KNEE ARTHROSCOPY Left   . POSTERIOR LUMBAR FUSION    . ROTATOR CUFF REPAIR Left   . TONSILLECTOMY      Social History:  reports that he quit smoking about 56 years ago. His smoking use included cigarettes. He quit after 7.00 years of use. He has never used smokeless tobacco. He reports that he drinks alcohol. He reports that he does not use drugs.  Family History:  Family History  Problem Relation Age of Onset  . Hypertension Father      Prior to Admission medications   Medication Sig  Start Date End Date Taking? Authorizing Provider  acetaminophen (TYLENOL) 325 MG tablet Take 650 mg by mouth every 6 (six) hours as needed for mild pain.    [provider]  clopidogrel (PLAVIX) 75 MG tablet Take 1 tablet (75 mg total) by mouth daily. 04/06/18   Dhungel, Nishant, MD  COMBIVENT RESPIMAT 20-100 MCG/ACT AERS respimat Inhale 1 puff into the lungs 4 (four) times daily.  06/01/18   [provider]  cyanocobalamin (,VITAMIN B-12,) 1000 MCG/ML injection Inject 1,000 mcg into the  muscle every 30 (thirty) days.    [provider]  escitalopram (LEXAPRO) 5 MG tablet Take 1 tablet by mouth daily. 08/24/18   [provider]  hydrALAZINE (APRESOLINE) 25 MG tablet Take 1 tablet (25 mg total) by mouth 3 (three) times daily. 11/11/18   Minus Breeding, MD  levothyroxine (SYNTHROID, LEVOTHROID) 50 MCG tablet Take 1 tablet (50 mcg total) by mouth every morning. Patient taking differently: Take 50 mcg by mouth daily before breakfast.  04/05/18   Dhungel, Flonnie Overman, MD  loratadine (CLARITIN) 10 MG tablet Take 10 mg by mouth daily.    [provider]  Melatonin 3 MG TABS Take 3 mg by mouth at bedtime.     [provider]  pantoprazole (PROTONIX) 40 MG tablet Take 1 tablet (40 mg total) by mouth daily. 04/05/18   Dhungel, Flonnie Overman, MD  pravastatin (PRAVACHOL) 10 MG tablet Take 1 tablet (10 mg total) by mouth daily. 06/06/18   Venancio Poisson, NP  sertraline (ZOLOFT) 100 MG tablet Take 1 tablet by mouth daily. 08/17/18   [provider]    Physical Exam: Vitals:   11/22/18 0330 11/22/18 0400 11/22/18 0430 11/22/18 0500  BP: 135/67 127/61 124/63 131/66  Pulse: 75 69 76 69  Resp: (!) 22 15 (!) 24 (!) 22  Temp:      TempSrc:      SpO2: 93% 93% 92% 95%   General: Not in acute distress HEENT:       Eyes: PERRL, EOMI, no scleral icterus.       ENT: No discharge from the ears and nose, no pharynx injection, no tonsillar enlargement.        Neck: No JVD, no bruit, no mass felt. Heme: No neck lymph node enlargement. Cardiac: S1/S2, RRR, No murmurs, No gallops or rubs. Respiratory: Has rhonchi and wheezing bilaterally. GI: Soft, nondistended, nontender, no rebound pain, no organomegaly, BS present. GU: No hematuria Ext: No pitting leg edema bilaterally. 2+DP/PT pulse bilaterally. Musculoskeletal: No joint deformities, No joint redness or warmth, no limitation of ROM in spin. Skin: No rashes.  Neuro: has dementia, knows his own name, not  oriented X3, cranial nerves II-XII grossly intact, moves all extremities normally. Psych: Patient is not psychotic, no suicidal or hemocidal ideation.  Labs on Admission: I have personally reviewed following labs and imaging studies  CBC: Recent Labs  Lab 11/21/18 2059  WBC 13.6*  HGB 12.4*  HCT 38.5*  MCV 88.5  PLT 539   Basic Metabolic Panel: Recent Labs  Lab 11/21/18 2059  NA 137  K 3.3*  CL 103  CO2 22  GLUCOSE 99  BUN 18  CREATININE 1.17  CALCIUM 9.3   GFR: CrCl cannot be calculated (Unknown ideal weight.). Liver Function Tests: No results for input(s): AST, ALT, ALKPHOS, BILITOT, PROT, ALBUMIN in the last 168 hours. No results for input(s): LIPASE, AMYLASE in the last 168 hours. No results for input(s): AMMONIA in the last 168 hours. Coagulation  Profile: No results for input(s): INR, PROTIME in the last 168 hours. Cardiac Enzymes: No results for input(s): CKTOTAL, CKMB, CKMBINDEX, TROPONINI in the last 168 hours. BNP (last 3 results) Recent Labs    09/08/18 1446  PROBNP 116   HbA1C: No results for input(s): HGBA1C in the last 72 hours. CBG: No results for input(s): GLUCAP in the last 168 hours. Lipid Profile: No results for input(s): CHOL, HDL, LDLCALC, TRIG, CHOLHDL, LDLDIRECT in the last 72 hours. Thyroid Function Tests: No results for input(s): TSH, T4TOTAL, FREET4, T3FREE, THYROIDAB in the last 72 hours. Anemia Panel: No results for input(s): VITAMINB12, FOLATE, FERRITIN, TIBC, IRON, RETICCTPCT in the last 72 hours. Urine analysis:    Component Value Date/Time   COLORURINE STRAW (A) 11/02/2018 0849   APPEARANCEUR CLEAR 11/02/2018 0849   LABSPEC 1.013 11/02/2018 0849   PHURINE 5.0 11/02/2018 0849   GLUCOSEU NEGATIVE 11/02/2018 0849   HGBUR SMALL (A) 11/02/2018 0849   BILIRUBINUR NEGATIVE 11/02/2018 0849   BILIRUBINUR NEG 01/21/2018 2022   KETONESUR NEGATIVE 11/02/2018 0849   PROTEINUR 100 (A) 11/02/2018 0849   UROBILINOGEN 1.0 01/21/2018  2022   NITRITE NEGATIVE 11/02/2018 0849   LEUKOCYTESUR NEGATIVE 11/02/2018 0849   Sepsis Labs: @LABRCNTIP (procalcitonin:4,lacticidven:4) )No results found for this or any previous visit (from the past 240 hour(s)).   Radiological Exams on Admission: Dg Chest 2 View  Result Date: 11/21/2018 CLINICAL DATA:  Initial evaluation for acute chest pain. EXAM: CHEST - 2 VIEW COMPARISON:  Prior radiograph from earlier the same day. FINDINGS: Moderate cardiomegaly.  Mediastinal silhouette within normal limits. Lungs are hypoinflated. Curvilinear left basilar opacity most consistent with atelectasis. Additional atelectatic changes at the right lung base. Diffuse pulmonary vascular and interstitial congestion, consistent with mild diffuse pulmonary interstitial edema. No appreciable pleural effusion. No other focal infiltrates. No pneumothorax. No acute osseous abnormality. IMPRESSION: 1. Cardiomegaly with mild diffuse pulmonary interstitial edema, suggesting CHF. 2. Low lung volumes with superimposed bibasilar atelectasis. Electronically Signed   By: Jeannine Boga M.D.   On: 11/21/2018 21:59   Ct Head Wo Contrast  Result Date: 11/22/2018 CLINICAL DATA:  Head trauma EXAM: CT HEAD WITHOUT CONTRAST TECHNIQUE: Contiguous axial images were obtained from the base of the skull through the vertex without intravenous contrast. COMPARISON:  11/02/2018 FINDINGS: Brain: No acute findings. There is generalized atrophy without lobar predilection. There is hypoattenuation of the periventricular white matter, most commonly indicating chronic ischemic microangiopathy. Vascular: No abnormal hyperdensity of the major intracranial arteries or dural venous sinuses. No intracranial atherosclerosis. Skull: The visualized skull base, calvarium and extracranial soft tissues are normal. Sinuses/Orbits: No fluid levels or advanced mucosal thickening of the visualized paranasal sinuses. No mastoid or middle ear effusion. The orbits  are normal. IMPRESSION: A chronic small vessel ischemia and generalized atrophy without acute intracranial abnormality. Electronically Signed   By: Ulyses Jarred M.D.   On: 11/22/2018 03:23     EKG: Independently reviewed.  Sinus rhythm, QTC 547, bifascicular block, early R wave progression, nonspecific T wave change.   Assessment/Plan Principal Problem:   HCAP (healthcare-associated pneumonia) Active Problems:   GERD (gastroesophageal reflux disease)   Chronic kidney disease, stage III (moderate) (HCC)   Dyslipidemia   CVA (cerebral vascular accident) ([place])   Hypothyroidism   Benign essential HTN   Hypokalemia   Fall   Chronic diastolic CHF (congestive heart failure) ([place])   HCAP (healthcare-associated pneumonia and sepsis: Chest x-ray did not show typical infiltration, but clinically patient seems to have pneumonia (HCAP)  due to productive cough, leukocytosis, chest pain.  Patient admitted critical for sepsis with leukocytosis, fever and tachypnea.  Currently hemodynamically stable.  Lactic acid 0.7--> 2.0.  - will admit to tele bed as inpt - IV Vancomycin and cefepime, and azithromycin (pt received 1 dose of Rocephin in ED) - Mucinex for cough  - prn Albuterol Nebs, DuoNeb for SOB - Urine legionella and S. pneumococcal antigen - Follow up blood culture x2, sputum culture and plus Flu pcr - will get Procalcitonin and trend lactic acid level per sepsis protocol - IVF: 1L of NS bolus in ED, followed by 75 mL per hour of NS (patient has history of grade 1 diastolic CHF, chest x-ray showed possible pulmonary edema, limiting aggressive IV fluid treatment.  GERD: -Protonix  Chronic kidney disease, stage III (moderate) ([place]): stable. Cre 1.17 -on IVF as above -f/u By BMP  Dyslipidemia: -Pravastatin  CVA (cerebral vascular accident) ([place]): -Continue Plavix and pravastatin  Hypothyroidism: -Continue Synthroid  HTN:  -Continue home medications: Hydralazine -IV hydralazine  prn  Hypokalemia: K= 3.3 on admission. - Repleted  Fall: no significant injury. -Follow-up CT head  Chronic diastolic CHF: 2D echo on 03/01/3613 showed EF 55-60% with grade 1 diastolic dysfunction.  Chest x-ray showed possible pulmonary edema, but patient does not have JVD or leg edema,  BNP is mildly elevated 152.7, does not seem to have CHF exacerbation. - Will not start diuretics due to sepsis. -Reports status closely   Inpatient status:  # Patient requires inpatient status due to high intensity of service, high risk for further deterioration and high frequency of surveillance required.  I certify that at the point of admission it is my clinical judgment that the patient will require inpatient hospital care spanning beyond 2 midnights from the point of admission.  . This patient has multiple chronic comorbidities including hypertension, hyperlipidemia, stroke, GERD, hypothyroidism, depression, dementia, CKD 3 . Now patient has presenting symptoms include who presents with cough, fever, chest pain, clinically consistent with HCAP . The worrisome physical exam findings include rhonchi and wheezing on auscultation . The initial radiographic and laboratory data are worrisome because of possible left-sided infiltration on chest x-ray, leukocytosis, elevated lactic acid, meets criteria for sepsis. . Current medical needs: please see my assessment and plan . Predictability of an adverse outcome (risk): Patient has multiple comorbidities, now presents with sepsis secondary to possible HCAP, due to history of grade 1 diastolic dysfunction, cannot treat patient with aggressive IV fluid.  Patient is a high risk of deteriorating.        DVT ppx: SQ Lovenox Code Status: Full code Family Communication:Yes, patient's daughter by phone Disposition Plan:  Anticipate discharge back to previous ALF Consults called:  none Admission status: Inpatient/tele     Date of Service 11/22/2018    Ivor Costa [facility] Pager [phone]  If 7PM-7AM, please contact night-coverage [email or web address] Password [facility] 11/22/2018, 5:39 AM

## 2018-11-21 NOTE — ED Triage Notes (Signed)
Pt arrives via EMS from [place] facility. Per their report pt with c/o chest pain today. Pt did have a fall this morning, not evaluated for the same, has been having left arm discomfort since then. Pt has had a productive cough since Wednesday, pt had cxr by facility no results sent with patient. Hx of dementia. En route, cbg 110, 140/64, 70hr afib bbb, 94% RA.

## 2018-11-21 NOTE — ED Provider Notes (Signed)
[facility] EMERGENCY DEPARTMENT Provider Note   CSN: [id number] Arrival date & time: 11/21/18  2041     History   Chief Complaint Chief Complaint  Patient presents with  . Chest Pain    HPI Anthony Hayes is a 83 y.o. male.  Patient c/o non productive cough, and ?chest pain earlier today. Pt poor historian - level 5 caveat, ?hx dementia. Pt denies chest pain or discomfort. Does note coughing, congestion in chest, wheezing.  Pt reportedly with fever today. Pt denies sore throat or runny nose. No leg pain or swelling.   The history is provided by the patient and the EMS personnel. The history is limited by the condition of the patient.  Chest Pain   Associated symptoms include cough. Pertinent negatives include no abdominal pain, no back pain, no fever, no headaches and no shortness of breath.    Past Medical History:  Diagnosis Date  . Arthritis    "joints" (10/15/2017)  . Benign prostatic hyperplasia   . Complication of anesthesia    "last OR in 06/2017 affected him cognitively; hasn't got back to baseline yet" (10/15/2017)  . Dementia ([place])   . Depression   . GERD (gastroesophageal reflux disease)   . High cholesterol   . Hypertension   . Hypothyroidism   . Renal disease   . Renal disorder   . TIA (transient ischemic attack) early 2000s    Patient Active Problem List   Diagnosis Date Noted  . Bradycardia 10/28/2018  . Leg swelling 10/28/2018  . Irregular heart beat 09/08/2018  . Sleep disturbance   . Stage 3 chronic kidney disease ([place])   . Benign essential HTN   . Hypokalemia   . Hyponatremia   . Infarction of right basal ganglia ([place]) 04/06/2018  . Pressure injury of skin 04/06/2018  . Vitamin B12 deficiency   . Hypothyroidism   . Crohn's disease with complication ([place])   . Benign prostatic hyperplasia   . Acute lower UTI   . Dementia without behavioral disturbance ([place])   . Acute ischemic stroke ([place]) 04/05/2018  . Leukocytosis   .  Shortness of breath   . Acute encephalopathy   . CVA (cerebral vascular accident) ([place]) 04/03/2018  . Calculus, kidney 01/21/2018  . Acute blood loss anemia   . Mallory-Weiss syndrome   . Esophageal stricture   . Respiratory failure ([place])   . UGIB (upper gastrointestinal bleed) 10/16/2017  . GERD (gastroesophageal reflux disease) 10/16/2017  . Essential hypertension, benign 10/16/2017  . Chest pain 10/15/2017  . Hypothyroidism (acquired) 06/08/2017  . Chronic kidney disease, stage III (moderate) ([place]) 03/19/2016  . Dyslipidemia 03/19/2016  . Degeneration of lumbar or lumbosacral intervertebral disc 07/10/2008  . Arthropathy, lower leg 05/23/2004    Past Surgical History:  Procedure Laterality Date  . ABDOMINAL EXPLORATION SURGERY  7/  . APPENDECTOMY    . CATARACT EXTRACTION W/ INTRAOCULAR LENS  IMPLANT, BILATERAL Bilateral   . COLECTOMY  1950s   "thought he had iteilis"  . ESOPHAGOGASTRODUODENOSCOPY N/A 10/22/2017   Procedure: ESOPHAGOGASTRODUODENOSCOPY (EGD);  Surgeon: Gatha Mayer, MD;  Location: [facility] ENDOSCOPY;  Service: Endoscopy;  Laterality: N/A;  . ESOPHAGOGASTRODUODENOSCOPY (EGD) WITH PROPOFOL N/A 10/16/2017   Procedure: ESOPHAGOGASTRODUODENOSCOPY (EGD) WITH PROPOFOL;  Surgeon: Milus Banister, MD;  Location: [facility] ENDOSCOPY;  Service: Endoscopy;  Laterality: N/A;  . FEMUR FRACTURE SURGERY Right 1950s  . FRACTURE SURGERY    . KNEE ARTHROSCOPY Left   . POSTERIOR LUMBAR FUSION    .  ROTATOR CUFF REPAIR Left   . TONSILLECTOMY          Home Medications    Prior to Admission medications   Medication Sig Start Date End Date Taking? Authorizing Provider  acetaminophen (TYLENOL) 325 MG tablet Take 650 mg by mouth every 6 (six) hours as needed for mild pain.    [provider]  clopidogrel (PLAVIX) 75 MG tablet Take 1 tablet (75 mg total) by mouth daily. 04/06/18   Dhungel, Nishant, MD  COMBIVENT RESPIMAT 20-100 MCG/ACT AERS respimat Inhale 1 puff into the lungs 4  (four) times daily.  06/01/18   [provider]  cyanocobalamin (,VITAMIN B-12,) 1000 MCG/ML injection Inject 1,000 mcg into the muscle every 30 (thirty) days.    [provider]  escitalopram (LEXAPRO) 5 MG tablet Take 1 tablet by mouth daily. 08/24/18   [provider]  hydrALAZINE (APRESOLINE) 25 MG tablet Take 1 tablet (25 mg total) by mouth 3 (three) times daily. 11/11/18   Minus Breeding, MD  levothyroxine (SYNTHROID, LEVOTHROID) 50 MCG tablet Take 1 tablet (50 mcg total) by mouth every morning. Patient taking differently: Take 50 mcg by mouth daily before breakfast.  04/05/18   Dhungel, Flonnie Overman, MD  loratadine (CLARITIN) 10 MG tablet Take 10 mg by mouth daily.    [provider]  Melatonin 3 MG TABS Take 3 mg by mouth at bedtime.     [provider]  pantoprazole (PROTONIX) 40 MG tablet Take 1 tablet (40 mg total) by mouth daily. 04/05/18   Dhungel, Flonnie Overman, MD  pravastatin (PRAVACHOL) 10 MG tablet Take 1 tablet (10 mg total) by mouth daily. 06/06/18   Venancio Poisson, NP  sertraline (ZOLOFT) 100 MG tablet Take 1 tablet by mouth daily. 08/17/18   [provider]    Family History Family History  Problem Relation Age of Onset  . Hypertension Father     Social History Social History   Tobacco Use  . Smoking status: Former Smoker    Years: 7.00    Types: Cigarettes    Last attempt to quit: 1963    Years since quitting: 56.9  . Smokeless tobacco: Never Used  Substance Use Topics  . Alcohol use: Yes    Comment: 10/15/2017 "a few drinks/year"  . Drug use: No     Allergies   Lisinopril   Review of Systems Review of Systems  Constitutional: Negative for fever.  HENT: Negative for sore throat.   Eyes: Negative for redness.  Respiratory: Positive for cough and wheezing. Negative for shortness of breath.   Cardiovascular: Positive for chest pain. Negative for leg swelling.  Gastrointestinal: Negative for abdominal pain.    Genitourinary: Negative for flank pain.  Musculoskeletal: Negative for back pain and neck pain.  Skin: Negative for rash.  Neurological: Negative for headaches.  Hematological: Does not bruise/bleed easily.  Psychiatric/Behavioral: Negative for confusion.     Physical Exam Updated Vital Signs BP (!) 165/74 (BP Location: Left Arm)   Pulse 69   Temp 99.6 F (37.6 C) (Oral)   Resp 19   SpO2 98%   Physical Exam  Constitutional: He appears well-developed and well-nourished.  HENT:  Mouth/Throat: Oropharynx is clear and moist.  Eyes: Conjunctivae are normal.  Neck: Neck supple. No tracheal deviation present.  Cardiovascular: Normal rate, regular rhythm, normal heart sounds and intact distal pulses. Exam reveals no gallop and no friction rub.  No murmur heard. Pulmonary/Chest: Effort normal. No accessory muscle usage. No respiratory distress. He has  wheezes.  Rales/rhonchi left.   Abdominal: Soft. Bowel sounds are normal. He exhibits no distension. There is no tenderness.  Musculoskeletal: He exhibits no edema or tenderness.  Neurological: He is alert.  Speech clear/fluent. Steady gait.   Skin: Skin is warm and dry. No rash noted.  Psychiatric: He has a normal mood and affect.  Nursing note and vitals reviewed.    ED Treatments / Results  Labs (all labs ordered are listed, but only abnormal results are displayed) Results for orders placed or performed during the hospital encounter of 57/26/20  Basic metabolic panel  Result Value Ref Range   Sodium 137 135 - 145 mmol/L   Potassium 3.3 (L) 3.5 - 5.1 mmol/L   Chloride 103 98 - 111 mmol/L   CO2 22 22 - 32 mmol/L   Glucose, Bld 99 70 - 99 mg/dL   BUN 18 8 - 23 mg/dL   Creatinine, Ser 1.17 0.61 - 1.24 mg/dL   Calcium 9.3 8.9 - 10.3 mg/dL   GFR calc non Af Amer 57 (L) >60 mL/min   GFR calc Af Amer >60 >60 mL/min   Anion gap 12 5 - 15  CBC  Result Value Ref Range   WBC 13.6 (H) 4.0 - 10.5 K/uL   RBC 4.35 4.22 - 5.81  MIL/uL   Hemoglobin 12.4 (L) 13.0 - 17.0 g/dL   HCT 38.5 (L) 39.0 - 52.0 %   MCV 88.5 80.0 - 100.0 fL   MCH 28.5 26.0 - 34.0 pg   MCHC 32.2 30.0 - 36.0 g/dL   RDW 14.3 11.5 - 15.5 %   Platelets 326 150 - 400 K/uL   nRBC 0.0 0.0 - 0.2 %  Brain natriuretic peptide  Result Value Ref Range   B Natriuretic Peptide 152.7 (H) 0.0 - 100.0 pg/mL  I-stat troponin, ED  Result Value Ref Range   Troponin i, poc 0.01 0.00 - 0.08 ng/mL   Comment 3          I-Stat CG4 Lactic Acid, ED  Result Value Ref Range   Lactic Acid, Venous 0.76 0.5 - 1.9 mmol/L   Dg Chest 2 View  Result Date: 11/21/2018 CLINICAL DATA:  Initial evaluation for acute chest pain. EXAM: CHEST - 2 VIEW COMPARISON:  Prior radiograph from earlier the same day. FINDINGS: Moderate cardiomegaly.  Mediastinal silhouette within normal limits. Lungs are hypoinflated. Curvilinear left basilar opacity most consistent with atelectasis. Additional atelectatic changes at the right lung base. Diffuse pulmonary vascular and interstitial congestion, consistent with mild diffuse pulmonary interstitial edema. No appreciable pleural effusion. No other focal infiltrates. No pneumothorax. No acute osseous abnormality. IMPRESSION: 1. Cardiomegaly with mild diffuse pulmonary interstitial edema, suggesting CHF. 2. Low lung volumes with superimposed bibasilar atelectasis. Electronically Signed   By: Jeannine Boga M.D.   On: 11/21/2018 21:59   Ct Head Wo Contrast  Result Date: 11/02/2018 CLINICAL DATA:  Fall in bathroom. Laceration and soft tissue swelling above the left orbit. Patient is amnestic to the event. Patient is on Plavix. EXAM: CT HEAD WITHOUT CONTRAST CT CERVICAL SPINE WITHOUT CONTRAST TECHNIQUE: Multidetector CT imaging of the head and cervical spine was performed following the standard protocol without intravenous contrast. Multiplanar CT image reconstructions of the cervical spine were also generated. COMPARISON:  None. FINDINGS: CT HEAD  FINDINGS Brain: Advanced atrophy and diffuse white matter disease are again noted. No acute infarct, hemorrhage, or mass lesion is present. The ventricles are stable and proportionate to the degree of atrophy. Remote lacunar  infarcts of the basal ganglia are stable. Brainstem and cerebellum are unchanged. No significant extra-axial collection or hemorrhage is present. Vascular: The cavernous mild atherosclerotic calcifications internal carotid are again seen within arteries. There is no hyperdense vessel. Skull: Calvarium is intact. Left supraorbital scalp soft tissue swelling and hematoma is present. There is no radiopaque foreign body. Laceration is noted. There is no underlying fracture. Sinuses/Orbits: The paranasal sinuses and mastoid air cells are clear. Bilateral lens replacements are present. Globes and orbits are otherwise within normal limits. CT CERVICAL SPINE FINDINGS Alignment: AP alignment is anatomic. There is no significant interval change. Skull base and vertebrae: Craniocervical junction is normal. Vertebral body heights are normal. No acute or healing fracture is present. Soft tissues and spinal canal: The paraspinous soft tissues are within normal limits. Atherosclerotic calcifications are present at the aortic arch. There is no significant calcification at the carotid bifurcations. Disc levels:  Multilevel facet degenerative changes are stable. Upper chest: Lung apices are clear. IMPRESSION: 1. Left supraorbital scalp laceration and hematoma without underlying fracture. 2. No globe injury is evident. 3. Stable advanced atrophy and white matter disease. 4. No acute intracranial abnormality. 5. Stable multilevel degenerative changes in the cervical spine without acute fracture or traumatic subluxation. Electronically Signed   By: [name] M.D.   On: 11/02/2018 08:40   Ct Cervical Spine Wo Contrast  Result Date: 11/02/2018 CLINICAL DATA:  Fall in bathroom. Laceration and soft tissue  swelling above the left orbit. Patient is amnestic to the event. Patient is on Plavix. EXAM: CT HEAD WITHOUT CONTRAST CT CERVICAL SPINE WITHOUT CONTRAST TECHNIQUE: Multidetector CT imaging of the head and cervical spine was performed following the standard protocol without intravenous contrast. Multiplanar CT image reconstructions of the cervical spine were also generated. COMPARISON:  None. FINDINGS: CT HEAD FINDINGS Brain: Advanced atrophy and diffuse white matter disease are again noted. No acute infarct, hemorrhage, or mass lesion is present. The ventricles are stable and proportionate to the degree of atrophy. Remote lacunar infarcts of the basal ganglia are stable. Brainstem and cerebellum are unchanged. No significant extra-axial collection or hemorrhage is present. Vascular: The cavernous mild atherosclerotic calcifications internal carotid are again seen within arteries. There is no hyperdense vessel. Skull: Calvarium is intact. Left supraorbital scalp soft tissue swelling and hematoma is present. There is no radiopaque foreign body. Laceration is noted. There is no underlying fracture. Sinuses/Orbits: The paranasal sinuses and mastoid air cells are clear. Bilateral lens replacements are present. Globes and orbits are otherwise within normal limits. CT CERVICAL SPINE FINDINGS Alignment: AP alignment is anatomic. There is no significant interval change. Skull base and vertebrae: Craniocervical junction is normal. Vertebral body heights are normal. No acute or healing fracture is present. Soft tissues and spinal canal: The paraspinous soft tissues are within normal limits. Atherosclerotic calcifications are present at the aortic arch. There is no significant calcification at the carotid bifurcations. Disc levels:  Multilevel facet degenerative changes are stable. Upper chest: Lung apices are clear. IMPRESSION: 1. Left supraorbital scalp laceration and hematoma without underlying fracture. 2. No globe injury  is evident. 3. Stable advanced atrophy and white matter disease. 4. No acute intracranial abnormality. 5. Stable multilevel degenerative changes in the cervical spine without acute fracture or traumatic subluxation. Electronically Signed   By: [name] M.D.   On: 11/02/2018 08:40    EKG EKG Interpretation  Date/Time:  Monday November 21 2018 20:44:00 EST Ventricular Rate:  76 PR Interval:    QRS Duration:  151 QT Interval:  486 QTC Calculation: 547 R Axis:   -87 Text Interpretation:  Sinus rhythm RBBB and LAFB LVH by voltage Confirmed by Lajean Saver [phone]) on 11/21/2018 9:08:56 PM   Radiology Dg Chest 2 View  Result Date: 11/21/2018 CLINICAL DATA:  Initial evaluation for acute chest pain. EXAM: CHEST - 2 VIEW COMPARISON:  Prior radiograph from earlier the same day. FINDINGS: Moderate cardiomegaly.  Mediastinal silhouette within normal limits. Lungs are hypoinflated. Curvilinear left basilar opacity most consistent with atelectasis. Additional atelectatic changes at the right lung base. Diffuse pulmonary vascular and interstitial congestion, consistent with mild diffuse pulmonary interstitial edema. No appreciable pleural effusion. No other focal infiltrates. No pneumothorax. No acute osseous abnormality. IMPRESSION: 1. Cardiomegaly with mild diffuse pulmonary interstitial edema, suggesting CHF. 2. Low lung volumes with superimposed bibasilar atelectasis. Electronically Signed   By: Jeannine Boga M.D.   On: 11/21/2018 21:59    Procedures Procedures (including critical care time)  Medications Ordered in ED Medications  albuterol (PROVENTIL) (2.5 MG/3ML) 0.083% nebulizer solution 5 mg (has no administration in time range)  ipratropium (ATROVENT) nebulizer solution 0.5 mg (has no administration in time range)     Initial Impression / Assessment and Plan / ED Course  I have reviewed the triage vital signs and the nursing notes.  Pertinent labs & imaging results  that were available during my care of the patient were reviewed by me and considered in my medical decision making (see chart for details).  Iv ns. Cxr. Labs.  Room air pulse ox is 88%. o2 2 liters [place].   Reviewed nursing notes and prior charts for additional history.   Albuterol and atrovent neb.   Repeat temp - fever 101.   Lactate and cultures added to workup.  Based on chest exam, suspect left pna. Iv antibiotics given.  Persistent wheezing.   Additional albuterol and atrovent neb.   Hospitalists consulted for admission.      Final Clinical Impressions(s) / ED Diagnoses   Final diagnoses:  None    ED Discharge Orders    None       Lajean Saver, MD 11/21/18 2332

## 2018-11-22 ENCOUNTER — Inpatient Hospital Stay (HOSPITAL_COMMUNITY): Payer: Medicare Other

## 2018-11-22 DIAGNOSIS — K219 Gastro-esophageal reflux disease without esophagitis: Secondary | ICD-10-CM | POA: Diagnosis present

## 2018-11-22 DIAGNOSIS — I10 Essential (primary) hypertension: Secondary | ICD-10-CM | POA: Diagnosis not present

## 2018-11-22 DIAGNOSIS — E7800 Pure hypercholesterolemia, unspecified: Secondary | ICD-10-CM | POA: Diagnosis present

## 2018-11-22 DIAGNOSIS — Z7902 Long term (current) use of antithrombotics/antiplatelets: Secondary | ICD-10-CM | POA: Diagnosis not present

## 2018-11-22 DIAGNOSIS — Z888 Allergy status to other drugs, medicaments and biological substances status: Secondary | ICD-10-CM | POA: Diagnosis not present

## 2018-11-22 DIAGNOSIS — Z7951 Long term (current) use of inhaled steroids: Secondary | ICD-10-CM | POA: Diagnosis not present

## 2018-11-22 DIAGNOSIS — A419 Sepsis, unspecified organism: Secondary | ICD-10-CM | POA: Diagnosis present

## 2018-11-22 DIAGNOSIS — Z981 Arthrodesis status: Secondary | ICD-10-CM | POA: Diagnosis not present

## 2018-11-22 DIAGNOSIS — J189 Pneumonia, unspecified organism: Secondary | ICD-10-CM | POA: Diagnosis present

## 2018-11-22 DIAGNOSIS — E039 Hypothyroidism, unspecified: Secondary | ICD-10-CM | POA: Diagnosis present

## 2018-11-22 DIAGNOSIS — Y95 Nosocomial condition: Secondary | ICD-10-CM | POA: Diagnosis present

## 2018-11-22 DIAGNOSIS — Z79899 Other long term (current) drug therapy: Secondary | ICD-10-CM | POA: Diagnosis not present

## 2018-11-22 DIAGNOSIS — R0902 Hypoxemia: Secondary | ICD-10-CM | POA: Insufficient documentation

## 2018-11-22 DIAGNOSIS — N183 Chronic kidney disease, stage 3 (moderate): Secondary | ICD-10-CM | POA: Diagnosis present

## 2018-11-22 DIAGNOSIS — F329 Major depressive disorder, single episode, unspecified: Secondary | ICD-10-CM | POA: Diagnosis present

## 2018-11-22 DIAGNOSIS — E872 Acidosis: Secondary | ICD-10-CM | POA: Diagnosis present

## 2018-11-22 DIAGNOSIS — N400 Enlarged prostate without lower urinary tract symptoms: Secondary | ICD-10-CM | POA: Diagnosis present

## 2018-11-22 DIAGNOSIS — I5032 Chronic diastolic (congestive) heart failure: Secondary | ICD-10-CM | POA: Diagnosis present

## 2018-11-22 DIAGNOSIS — W19XXXA Unspecified fall, initial encounter: Secondary | ICD-10-CM | POA: Diagnosis present

## 2018-11-22 DIAGNOSIS — E876 Hypokalemia: Secondary | ICD-10-CM | POA: Diagnosis present

## 2018-11-22 DIAGNOSIS — F0390 Unspecified dementia without behavioral disturbance: Secondary | ICD-10-CM | POA: Diagnosis present

## 2018-11-22 DIAGNOSIS — I130 Hypertensive heart and chronic kidney disease with heart failure and stage 1 through stage 4 chronic kidney disease, or unspecified chronic kidney disease: Secondary | ICD-10-CM | POA: Diagnosis present

## 2018-11-22 DIAGNOSIS — Z87891 Personal history of nicotine dependence: Secondary | ICD-10-CM | POA: Diagnosis not present

## 2018-11-22 DIAGNOSIS — Z8673 Personal history of transient ischemic attack (TIA), and cerebral infarction without residual deficits: Secondary | ICD-10-CM | POA: Diagnosis not present

## 2018-11-22 LAB — INFLUENZA PANEL BY PCR (TYPE A & B)
Influenza A By PCR: NEGATIVE
Influenza B By PCR: NEGATIVE

## 2018-11-22 LAB — PROCALCITONIN: Procalcitonin: <0.1 ng/mL

## 2018-11-22 LAB — EXPECTORATED SPUTUM ASSESSMENT W GRAM STAIN, RFLX TO RESP C

## 2018-11-22 LAB — MRSA PCR SCREENING: MRSA by PCR: POSITIVE — AB

## 2018-11-22 LAB — HIV ANTIBODY (ROUTINE TESTING W REFLEX): HIV SCREEN 4TH GENERATION: NONREACTIVE

## 2018-11-22 LAB — LACTIC ACID, PLASMA: LACTIC ACID, VENOUS: 2 mmol/L — AB (ref 0.5–1.9)

## 2018-11-22 LAB — EXPECTORATED SPUTUM ASSESSMENT W REFEX TO RESP CULTURE

## 2018-11-22 MED ORDER — LORATADINE 10 MG PO TABS: 10.0000 mg | ORAL_TABLET | Freq: Every day | ORAL | Status: DC | PRN

## 2018-11-22 MED ORDER — SODIUM CHLORIDE 0.9 % IV BOLUS
1000.0000 mL | Freq: Once | INTRAVENOUS | Status: AC
  Administered 2018-11-22: 1000 mL via INTRAVENOUS

## 2018-11-22 MED ORDER — PANTOPRAZOLE SODIUM 40 MG PO TBEC
40.0000 mg | DELAYED_RELEASE_TABLET | Freq: Two times a day (BID) | ORAL | Status: DC
  Administered 2018-11-22 – 2018-11-25 (×7): 40 mg via ORAL
  Filled 2018-11-22 (×7): qty 1

## 2018-11-22 MED ORDER — ENOXAPARIN SODIUM 40 MG/0.4ML ~~LOC~~ SOLN
40.0000 mg | Freq: Every day | SUBCUTANEOUS | Status: DC
  Administered 2018-11-22 – 2018-11-25 (×4): 40 mg via SUBCUTANEOUS
  Filled 2018-11-22 (×5): qty 0.4

## 2018-11-22 MED ORDER — LEVOTHYROXINE SODIUM 50 MCG PO TABS
50.0000 ug | ORAL_TABLET | Freq: Every day | ORAL | Status: DC
  Administered 2018-11-23 – 2018-11-25 (×3): 50 ug via ORAL
  Filled 2018-11-22 (×4): qty 1

## 2018-11-22 MED ORDER — DM-GUAIFENESIN ER 30-600 MG PO TB12: 1.0000 | ORAL_TABLET | Freq: Two times a day (BID) | ORAL | Status: DC | PRN

## 2018-11-22 MED ORDER — MUPIROCIN 2 % EX OINT
1.0000 "application " | TOPICAL_OINTMENT | Freq: Two times a day (BID) | CUTANEOUS | Status: DC
  Administered 2018-11-23 – 2018-11-25 (×6): 1 via NASAL
  Filled 2018-11-22 (×3): qty 22

## 2018-11-22 MED ORDER — ZOLPIDEM TARTRATE 5 MG PO TABS
5.0000 mg | ORAL_TABLET | Freq: Every evening | ORAL | Status: DC | PRN
  Administered 2018-11-23: 5 mg via ORAL
  Filled 2018-11-22: qty 1

## 2018-11-22 MED ORDER — SERTRALINE HCL 100 MG PO TABS
100.0000 mg | ORAL_TABLET | Freq: Every day | ORAL | Status: DC
  Administered 2018-11-22 – 2018-11-25 (×4): 100 mg via ORAL
  Filled 2018-11-22 (×5): qty 1

## 2018-11-22 MED ORDER — HYDRALAZINE HCL 20 MG/ML IJ SOLN: 5.0000 mg | INTRAMUSCULAR | Status: DC | PRN

## 2018-11-22 MED ORDER — ESCITALOPRAM OXALATE 10 MG PO TABS
5.0000 mg | ORAL_TABLET | Freq: Every day | ORAL | Status: DC
  Administered 2018-11-22 – 2018-11-25 (×4): 5 mg via ORAL
  Filled 2018-11-22 (×4): qty 1

## 2018-11-22 MED ORDER — SODIUM CHLORIDE 0.9 % IV SOLN
1.0000 g | Freq: Two times a day (BID) | INTRAVENOUS | Status: DC
  Administered 2018-11-22 – 2018-11-25 (×7): 1 g via INTRAVENOUS
  Filled 2018-11-22 (×8): qty 1

## 2018-11-22 MED ORDER — IPRATROPIUM-ALBUTEROL 0.5-2.5 (3) MG/3ML IN SOLN
3.0000 mL | RESPIRATORY_TRACT | Status: DC
  Administered 2018-11-22 – 2018-11-23 (×9): 3 mL via RESPIRATORY_TRACT
  Filled 2018-11-22 (×10): qty 3

## 2018-11-22 MED ORDER — HYDRALAZINE HCL 25 MG PO TABS
25.0000 mg | ORAL_TABLET | Freq: Three times a day (TID) | ORAL | Status: DC
  Administered 2018-11-22 – 2018-11-25 (×9): 25 mg via ORAL
  Filled 2018-11-22 (×10): qty 1

## 2018-11-22 MED ORDER — CHLORHEXIDINE GLUCONATE CLOTH 2 % EX PADS
6.0000 | MEDICATED_PAD | Freq: Every day | CUTANEOUS | Status: DC
  Administered 2018-11-23 – 2018-11-25 (×3): 6 via TOPICAL

## 2018-11-22 MED ORDER — PRAVASTATIN SODIUM 10 MG PO TABS
10.0000 mg | ORAL_TABLET | Freq: Every day | ORAL | Status: DC
  Administered 2018-11-22 – 2018-11-24 (×3): 10 mg via ORAL
  Filled 2018-11-22 (×4): qty 1

## 2018-11-22 MED ORDER — VANCOMYCIN HCL IN DEXTROSE 1-5 GM/200ML-% IV SOLN
1000.0000 mg | INTRAVENOUS | Status: DC
  Administered 2018-11-23 – 2018-11-24 (×2): 1000 mg via INTRAVENOUS
  Filled 2018-11-22 (×3): qty 200

## 2018-11-22 MED ORDER — HYDROXYZINE HCL 10 MG PO TABS
10.0000 mg | ORAL_TABLET | Freq: Three times a day (TID) | ORAL | Status: DC | PRN
  Filled 2018-11-22: qty 1

## 2018-11-22 MED ORDER — ALBUTEROL SULFATE (2.5 MG/3ML) 0.083% IN NEBU: 2.5000 mg | INHALATION_SOLUTION | RESPIRATORY_TRACT | Status: DC | PRN

## 2018-11-22 MED ORDER — CLOPIDOGREL BISULFATE 75 MG PO TABS
75.0000 mg | ORAL_TABLET | Freq: Every day | ORAL | Status: DC
  Administered 2018-11-22 – 2018-11-25 (×4): 75 mg via ORAL
  Filled 2018-11-22 (×4): qty 1

## 2018-11-22 MED ORDER — POTASSIUM CHLORIDE 20 MEQ/15ML (10%) PO SOLN
20.0000 meq | Freq: Once | ORAL | Status: AC
  Administered 2018-11-22: 20 meq via ORAL
  Filled 2018-11-22: qty 15

## 2018-11-22 MED ORDER — SODIUM CHLORIDE 0.9 % IV SOLN
INTRAVENOUS | Status: DC
  Administered 2018-11-22 (×2): via INTRAVENOUS

## 2018-11-22 MED ORDER — CYANOCOBALAMIN 1000 MCG/ML IJ SOLN: 1000.0000 ug | INTRAMUSCULAR | Status: DC

## 2018-11-22 MED ORDER — ACETAMINOPHEN 325 MG PO TABS
650.0000 mg | ORAL_TABLET | Freq: Four times a day (QID) | ORAL | Status: DC | PRN
  Administered 2018-11-22 – 2018-11-24 (×4): 650 mg via ORAL
  Filled 2018-11-22 (×5): qty 2

## 2018-11-22 MED ORDER — VANCOMYCIN HCL 10 G IV SOLR
1500.0000 mg | Freq: Once | INTRAVENOUS | Status: AC
  Administered 2018-11-22: 1500 mg via INTRAVENOUS
  Filled 2018-11-22: qty 1500

## 2018-11-22 NOTE — Progress Notes (Signed)
[facility]                                                                              Patient Demographics  Anthony Hayes, is a 83 y.o. male, DOB - 18-Apr-1935, GXQ:[id number]  Admit date - 11/21/2018   Admitting Physician Ivor Costa, MD  Outpatient Primary MD for the patient is Etter Sjogren, DO  Outpatient specialists:   LOS - 0  days   Medical records reviewed and are as summarized below:    Chief Complaint  Patient presents with  . Chest Pain       Brief summary   Patient is a 83 year old male with hypertension, hyperlipidemia, stroke, GERD, hypothyroidism, depression, CKD, dementia presented with fevers cough, chest pain.  Patient was having symptoms for last 5 days with yellowish colored sputum.  WBC is 13 chest x-ray showed cardiomegaly with mild diffuse pulmonary interstitial changes, left-sided infiltration.  Patient was admitted for further work-up.  Assessment & Plan    Principal Problem: Sepsis with healthcare associated pneumonia/H CAP -Patient met criteria for sepsis with leukocytosis, fever, tachypnea, lactic acidosis, chest x-ray with possible infiltration. -Continue broad-spectrum IV antibiotics, gentle hydration, follow blood cultures, sputum cultures, urine Legionella antigen, urine strep antigen -Continue duo nebs, Mucinex -Influenza panel negative  Active problems Chronic kidney disease stage III -Follow BMET, creatinine currently at baseline, 1.1  GERD Continue PPI  History of CVA Currently stable, continue Plavix, pravastatin  History of hyperlipidemia Continue statin  Hypothyroidism Continue Synthroid  Chronic diastolic CHF 2D echo 1/44 showed EF of 55 to 60% with grade 1 diastolic dysfunction -Mild BNP elevation, follow closely, I's and O's  Essential hypertension Continue hydralazine  Mechanical fall CT head showed chronic small vessel ischemia and generalized atrophy otherwise no intracranial  abnormality  Code Status: Full CODE STATUS DVT Prophylaxis: Lovenox Family Communication: Discussed in detail with the patient, all imaging results, lab results explained to the patient or    Disposition Plan: *Pending clinical improvement  Time Spent in minutes   25 minutes  Procedures:  CT head  Consultants:   None  Antimicrobials:      Medications  Scheduled Meds: . clopidogrel  75 mg Oral Daily  . [START ON 12/06/2018] cyanocobalamin  1,000 mcg Intramuscular Q30 days  . enoxaparin (LOVENOX) injection  40 mg Subcutaneous Daily  . escitalopram  5 mg Oral Daily  . hydrALAZINE  25 mg Oral TID  . ipratropium-albuterol  3 mL Nebulization Q4H  . levothyroxine  50 mcg Oral QAC breakfast  . pantoprazole  40 mg Oral BID  . pravastatin  10 mg Oral q1800  . sertraline  100 mg Oral Daily   Continuous Infusions: . sodium chloride    . sodium chloride 75 mL/hr at 11/22/18 0544  . azithromycin Stopped (11/22/18 0130)  . ceFEPime (MAXIPIME) IV Stopped (11/22/18 1000)  . [START ON 11/23/2018] vancomycin     PRN Meds:.sodium chloride, acetaminophen, albuterol, dextromethorphan-guaiFENesin, hydrALAZINE, hydrOXYzine, loratadine, zolpidem   Antibiotics   Anti-infectives (From admission, onward)   Start     Dose/Rate Route Frequency Ordered Stop   11/23/18 0600  vancomycin (VANCOCIN) IVPB 1000  mg/200 mL premix     1,000 mg 200 mL/hr over 60 Minutes Intravenous Every 24 hours 11/22/18 0243     11/22/18 0800  ceFEPIme (MAXIPIME) 1 g in sodium chloride 0.9 % 100 mL IVPB    Note to Pharmacy:  Cefepime 1 g IV q12h for CrCl < 60 mL/min   1 g 200 mL/hr over 30 Minutes Intravenous Every 12 hours 11/22/18 0158 11/30/18 0759   11/22/18 0300  vancomycin (VANCOCIN) 1,500 mg in sodium chloride 0.9 % 500 mL IVPB     1,500 mg 250 mL/hr over 120 Minutes Intravenous  Once 11/22/18 0243 11/22/18 0607   11/21/18 2330  cefTRIAXone (ROCEPHIN) 2 g in sodium chloride 0.9 % 100 mL IVPB  Status:   Discontinued     2 g 200 mL/hr over 30 Minutes Intravenous Every 24 hours 11/21/18 2326 11/22/18 0153   11/21/18 2330  azithromycin (ZITHROMAX) 500 mg in sodium chloride 0.9 % 250 mL IVPB     500 mg 250 mL/hr over 60 Minutes Intravenous Every 24 hours 11/21/18 2326          Subjective:   Jenny Reichmann Klaiber was seen and examined today.  Appears to be comfortable, denies any specific complaints.  No pain.  Patient denies dizziness, chest pain, abdominal pain, N/V/D/C.   Objective:   Vitals:   11/22/18 1030 11/22/18 1130 11/22/18 1145 11/22/18 1200  BP: (!) 143/65 (!) 164/70  (!) 155/67  Pulse: 65 64 63 (!) 58  Resp: 17 19 (!) 22 (!) 22  Temp:      TempSrc:      SpO2: 94% 97% 96% 92%    Intake/Output Summary (Last 24 hours) at 11/22/2018 1240 Last data filed at 11/22/2018 1000 Gross per 24 hour  Intake 100 ml  Output -  Net 100 ml     Wt Readings from Last 3 Encounters:  10/28/18 79.2 kg  09/20/18 79.3 kg  09/08/18 78.2 kg     Exam  General: Alert and awake, NAD  Eyes:   HEENT:  Atraumatic, normocephalic  Cardiovascular: S1 S2 auscultated, Regular rate and rhythm.  Respiratory: Bilateral rhonchi  Gastrointestinal: Soft, nontender, nondistended, + bowel sounds  Ext: no pedal edema bilaterally  Neuro: no new deficit  Musculoskeletal: No digital cyanosis, clubbing  Skin: No rashes  Psych: dementia   Data Reviewed:  I have personally reviewed following labs and imaging studies  Micro Results Recent Results (from the past 240 hour(s))  Blood Culture (routine x 2)     Status: None (Preliminary result)   Collection Time: 11/21/18  9:02 PM  Result Value Ref Range Status   Specimen Description BLOOD LEFT ANTECUBITAL  Final   Special Requests   Final    BOTTLES DRAWN AEROBIC AND ANAEROBIC Blood Culture adequate volume   Culture   Final    NO GROWTH < 12 HOURS Performed at [facility], [address] [place], [place] [address]    Report Status  PENDING  Incomplete  Blood Culture (routine x 2)     Status: None (Preliminary result)   Collection Time: 11/21/18 11:20 PM  Result Value Ref Range Status   Specimen Description BLOOD RIGHT ARM  Final   Special Requests   Final    BOTTLES DRAWN AEROBIC AND ANAEROBIC Blood Culture results may not be optimal due to an excessive volume of blood received in culture bottles   Culture   Final    NO GROWTH < 12 HOURS Performed at  [facility], [place] [address] [place], [place] [address]    Report Status PENDING  Incomplete    Radiology Reports Dg Chest 2 View  Result Date: 11/21/2018 CLINICAL DATA:  Initial evaluation for acute chest pain. EXAM: CHEST - 2 VIEW COMPARISON:  Prior radiograph from earlier the same day. FINDINGS: Moderate cardiomegaly.  Mediastinal silhouette within normal limits. Lungs are hypoinflated. Curvilinear left basilar opacity most consistent with atelectasis. Additional atelectatic changes at the right lung base. Diffuse pulmonary vascular and interstitial congestion, consistent with mild diffuse pulmonary interstitial edema. No appreciable pleural effusion. No other focal infiltrates. No pneumothorax. No acute osseous abnormality. IMPRESSION: 1. Cardiomegaly with mild diffuse pulmonary interstitial edema, suggesting CHF. 2. Low lung volumes with superimposed bibasilar atelectasis. Electronically Signed   By: Jeannine Boga M.D.   On: 11/21/2018 21:59   Ct Head Wo Contrast  Result Date: 11/22/2018 CLINICAL DATA:  Head trauma EXAM: CT HEAD WITHOUT CONTRAST TECHNIQUE: Contiguous axial images were obtained from the base of the skull through the vertex without intravenous contrast. COMPARISON:  11/02/2018 FINDINGS: Brain: No acute findings. There is generalized atrophy without lobar predilection. There is hypoattenuation of the periventricular white matter, most commonly indicating chronic ischemic microangiopathy. Vascular: No abnormal hyperdensity of the major  intracranial arteries or dural venous sinuses. No intracranial atherosclerosis. Skull: The visualized skull base, calvarium and extracranial soft tissues are normal. Sinuses/Orbits: No fluid levels or advanced mucosal thickening of the visualized paranasal sinuses. No mastoid or middle ear effusion. The orbits are normal. IMPRESSION: A chronic small vessel ischemia and generalized atrophy without acute intracranial abnormality. Electronically Signed   By: Ulyses Jarred M.D.   On: 11/22/2018 03:23   Ct Head Wo Contrast  Result Date: 11/02/2018 CLINICAL DATA:  Fall in bathroom. Laceration and soft tissue swelling above the left orbit. Patient is amnestic to the event. Patient is on Plavix. EXAM: CT HEAD WITHOUT CONTRAST CT CERVICAL SPINE WITHOUT CONTRAST TECHNIQUE: Multidetector CT imaging of the head and cervical spine was performed following the standard protocol without intravenous contrast. Multiplanar CT image reconstructions of the cervical spine were also generated. COMPARISON:  None. FINDINGS: CT HEAD FINDINGS Brain: Advanced atrophy and diffuse white matter disease are again noted. No acute infarct, hemorrhage, or mass lesion is present. The ventricles are stable and proportionate to the degree of atrophy. Remote lacunar infarcts of the basal ganglia are stable. Brainstem and cerebellum are unchanged. No significant extra-axial collection or hemorrhage is present. Vascular: The cavernous mild atherosclerotic calcifications internal carotid are again seen within arteries. There is no hyperdense vessel. Skull: Calvarium is intact. Left supraorbital scalp soft tissue swelling and hematoma is present. There is no radiopaque foreign body. Laceration is noted. There is no underlying fracture. Sinuses/Orbits: The paranasal sinuses and mastoid air cells are clear. Bilateral lens replacements are present. Globes and orbits are otherwise within normal limits. CT CERVICAL SPINE FINDINGS Alignment: AP alignment is  anatomic. There is no significant interval change. Skull base and vertebrae: Craniocervical junction is normal. Vertebral body heights are normal. No acute or healing fracture is present. Soft tissues and spinal canal: The paraspinous soft tissues are within normal limits. Atherosclerotic calcifications are present at the aortic arch. There is no significant calcification at the carotid bifurcations. Disc levels:  Multilevel facet degenerative changes are stable. Upper chest: Lung apices are clear. IMPRESSION: 1. Left supraorbital scalp laceration and hematoma without underlying fracture. 2. No globe injury is evident. 3. Stable advanced atrophy and white matter disease. 4.  No acute intracranial abnormality. 5. Stable multilevel degenerative changes in the cervical spine without acute fracture or traumatic subluxation. Electronically Signed   By: [name] M.D.   On: 11/02/2018 08:40   Ct Cervical Spine Wo Contrast  Result Date: 11/02/2018 CLINICAL DATA:  Fall in bathroom. Laceration and soft tissue swelling above the left orbit. Patient is amnestic to the event. Patient is on Plavix. EXAM: CT HEAD WITHOUT CONTRAST CT CERVICAL SPINE WITHOUT CONTRAST TECHNIQUE: Multidetector CT imaging of the head and cervical spine was performed following the standard protocol without intravenous contrast. Multiplanar CT image reconstructions of the cervical spine were also generated. COMPARISON:  None. FINDINGS: CT HEAD FINDINGS Brain: Advanced atrophy and diffuse white matter disease are again noted. No acute infarct, hemorrhage, or mass lesion is present. The ventricles are stable and proportionate to the degree of atrophy. Remote lacunar infarcts of the basal ganglia are stable. Brainstem and cerebellum are unchanged. No significant extra-axial collection or hemorrhage is present. Vascular: The cavernous mild atherosclerotic calcifications internal carotid are again seen within arteries. There is no hyperdense  vessel. Skull: Calvarium is intact. Left supraorbital scalp soft tissue swelling and hematoma is present. There is no radiopaque foreign body. Laceration is noted. There is no underlying fracture. Sinuses/Orbits: The paranasal sinuses and mastoid air cells are clear. Bilateral lens replacements are present. Globes and orbits are otherwise within normal limits. CT CERVICAL SPINE FINDINGS Alignment: AP alignment is anatomic. There is no significant interval change. Skull base and vertebrae: Craniocervical junction is normal. Vertebral body heights are normal. No acute or healing fracture is present. Soft tissues and spinal canal: The paraspinous soft tissues are within normal limits. Atherosclerotic calcifications are present at the aortic arch. There is no significant calcification at the carotid bifurcations. Disc levels:  Multilevel facet degenerative changes are stable. Upper chest: Lung apices are clear. IMPRESSION: 1. Left supraorbital scalp laceration and hematoma without underlying fracture. 2. No globe injury is evident. 3. Stable advanced atrophy and white matter disease. 4. No acute intracranial abnormality. 5. Stable multilevel degenerative changes in the cervical spine without acute fracture or traumatic subluxation. Electronically Signed   By: [name] M.D.   On: 11/02/2018 08:40    Lab Data:  CBC: Recent Labs  Lab 11/21/18 2059  WBC 13.6*  HGB 12.4*  HCT 38.5*  MCV 88.5  PLT 945   Basic Metabolic Panel: Recent Labs  Lab 11/21/18 2059  NA 137  K 3.3*  CL 103  CO2 22  GLUCOSE 99  BUN 18  CREATININE 1.17  CALCIUM 9.3   GFR: CrCl cannot be calculated (Unknown ideal weight.). Liver Function Tests: No results for input(s): AST, ALT, ALKPHOS, BILITOT, PROT, ALBUMIN in the last 168 hours. No results for input(s): LIPASE, AMYLASE in the last 168 hours. No results for input(s): AMMONIA in the last 168 hours. Coagulation Profile: No results for input(s): INR, PROTIME  in the last 168 hours. Cardiac Enzymes: No results for input(s): CKTOTAL, CKMB, CKMBINDEX, TROPONINI in the last 168 hours. BNP (last 3 results) Recent Labs    09/08/18 1446  PROBNP 116   HbA1C: No results for input(s): HGBA1C in the last 72 hours. CBG: No results for input(s): GLUCAP in the last 168 hours. Lipid Profile: No results for input(s): CHOL, HDL, LDLCALC, TRIG, CHOLHDL, LDLDIRECT in the last 72 hours. Thyroid Function Tests: No results for input(s): TSH, T4TOTAL, FREET4, T3FREE, THYROIDAB in the last 72 hours. Anemia Panel: No results for input(s): VITAMINB12, FOLATE, FERRITIN,  TIBC, IRON, RETICCTPCT in the last 72 hours. Urine analysis:    Component Value Date/Time   COLORURINE STRAW (A) 11/02/2018 0849   APPEARANCEUR CLEAR 11/02/2018 0849   LABSPEC 1.013 11/02/2018 0849   PHURINE 5.0 11/02/2018 0849   GLUCOSEU NEGATIVE 11/02/2018 0849   HGBUR SMALL (A) 11/02/2018 0849   BILIRUBINUR NEGATIVE 11/02/2018 0849   BILIRUBINUR NEG 01/21/2018 2022   KETONESUR NEGATIVE 11/02/2018 0849   PROTEINUR 100 (A) 11/02/2018 0849   UROBILINOGEN 1.0 01/21/2018 2022   NITRITE NEGATIVE 11/02/2018 0849   LEUKOCYTESUR NEGATIVE 11/02/2018 0849     Izzy Courville M.D. [facility] 11/22/2018, 12:40 PM  Pager: [phone] Between 7am to 7pm - call Pager - [phone]  After 7pm go to [email or web address] - password [email or web address]  Call night coverage person covering after 7pm

## 2018-11-22 NOTE — ED Notes (Signed)
No current urine output in condom cath

## 2018-11-22 NOTE — Progress Notes (Signed)
Pharmacy Antibiotic Note  Anthony Hayes is a 83 y.o. male admitted on 11/21/2018 with cough/chest pain.  Pharmacy has been consulted for Vancomycin  dosing.  Plan: Vancomycin 1500 mg IV now, then 1 g IV q24h    Temp (24hrs), Avg:99.6 F (37.6 C), Min:98 F (36.7 C), Max:101.1 F (38.4 C)  Recent Labs  Lab 11/21/18 2059 11/21/18 2325  WBC 13.6*  --   CREATININE 1.17  --   LATICACIDVEN  --  0.76    CrCl cannot be calculated (Unknown ideal weight.).    Allergies  Allergen Reactions  . Lisinopril Swelling    Swelling of lips     Anthony Hayes, Anthony Hayes 11/22/2018 2:37 AM

## 2018-11-22 NOTE — Care Management Note (Signed)
Case Management Note  Patient Details  Name: Anthony Hayes MRN: [id number] Date of Birth: Jun 27, 1935  Subjective/Objective:  From [facility],  Presents with sepsis with HAP, ckd3, gerd, hx of cva, hx of hld, hypothyroidism, chronic diastolic chf, thn, falls. CSW aware from ALF.                 Action/Plan: NCM will follow for transition of care needs.   Expected Discharge Date:                  Expected Discharge Plan:  Assisted Living / Rest Home  In-House Referral:  Clinical Social Work  Discharge planning Services  CM Consult  Post Acute Care Choice:    Choice offered to:     DME Arranged:    DME Agency:     HH Arranged:    HH Agency:     Status of Service:  In process, will continue to follow  If discussed at Long Length of Stay Meetings, dates discussed:    Additional Comments:  Zenon Mayo, RN 11/22/2018, 4:15 PM

## 2018-11-22 NOTE — ED Notes (Signed)
On call floor coverage (k. Schorr) paged to notify of lactic 2.0

## 2018-11-23 DIAGNOSIS — R509 Fever, unspecified: Secondary | ICD-10-CM

## 2018-11-23 LAB — STREP PNEUMONIAE URINARY ANTIGEN: STREP PNEUMO URINARY ANTIGEN: NEGATIVE

## 2018-11-23 MED ORDER — IPRATROPIUM-ALBUTEROL 0.5-2.5 (3) MG/3ML IN SOLN
3.0000 mL | Freq: Three times a day (TID) | RESPIRATORY_TRACT | Status: DC
  Administered 2018-11-24 – 2018-11-25 (×4): 3 mL via RESPIRATORY_TRACT
  Filled 2018-11-23 (×2): qty 3
  Filled 2018-11-23: qty 39
  Filled 2018-11-23: qty 3

## 2018-11-23 NOTE — Evaluation (Signed)
Occupational Therapy Evaluation Patient Details Name: Anthony Hayes MRN: [id number] DOB: June 04, 1935 Today's Date: 11/23/2018    History of Present Illness Patient is a 83 year old male with hypertension, hyperlipidemia, stroke, GERD, hypothyroidism, depression, CKD, dementia presented with fevers cough, chest pain.  Patient was having symptoms for last 5 days with yellowish colored sputum.  WBC is 13 chest x-ray showed cardiomegaly with mild diffuse pulmonary interstitial changes, left-sided infiltration.     Clinical Impression   Pt admitted with above problem list. Extensive discussion with pt's daughter re d/c planning- pt unable to return to ALF with CLOF (mod A transfers). Pt would benefit from SNF placement to maximize return to PLOF. Pt's daughter would prefer SNF in [place].     Follow Up Recommendations  SNF    Equipment Recommendations  Other (comment)(defer to next venue)    Recommendations for Other Services PT consult;Speech consult;Rehab consult     Precautions / Restrictions Precautions Precautions: Fall;Other (comment) Precaution Comments: Pt with L humeral fx, no restrictions Restrictions Weight Bearing Restrictions: No      Mobility Bed Mobility Overal bed mobility: Needs Assistance Bed Mobility: Supine to Sit     Supine to sit: Max assist     General bed mobility comments: use of bed rails and HOB raised  Transfers Overall transfer level: Needs assistance Equipment used: Rolling walker (2 wheeled) Transfers: Sit to/from [facility] Sit to Stand: Mod assist;From elevated surface Stand pivot transfers: Mod assist;From elevated surface       General transfer comment: use of RW and mod cueing for technique    Balance Overall balance assessment: Needs assistance Sitting-balance support: Feet supported Sitting balance-[name] Scale: Poor   Postural control: Posterior lean Standing balance support: During functional activity;Bilateral  upper extremity supported Standing balance-[name] Scale: Poor                             ADL either performed or assessed with clinical judgement   ADL Overall ADL's : Needs assistance/impaired Eating/Feeding: Minimal assistance;Sitting   Grooming: Minimal assistance;Sitting   Upper Body Bathing: Minimal assistance;Sitting   Lower Body Bathing: Moderate assistance;Sit to/from stand   Upper Body Dressing : Moderate assistance;Sitting   Lower Body Dressing: Moderate assistance;Sit to/from stand   Toilet Transfer: Moderate assistance;RW   Toileting- Clothing Manipulation and Hygiene: Minimal assistance;Sit to/from stand       Functional mobility during ADLs: Moderate assistance;Rolling walker       Vision Baseline Vision/History: No visual deficits Patient Visual Report: No change from baseline              Pertinent Vitals/Pain Pain Assessment: No/denies pain     Hand Dominance Left   Extremity/Trunk Assessment Upper Extremity Assessment Upper Extremity Assessment: LUE deficits/detail;Generalized weakness LUE Deficits / Details: Pain from fx limiting mobility at [facility] joint   Lower Extremity Assessment Lower Extremity Assessment: Defer to PT evaluation   Cervical / Trunk Assessment Cervical / Trunk Assessment: Kyphotic   Communication Communication Communication: Expressive difficulties   Cognition Arousal/Alertness: Awake/alert Behavior During Therapy: WFL for tasks assessed/performed Overall Cognitive Status: History of cognitive impairments - at baseline                                     General Comments  Pt's daughter present throughout            Home Living  Family/patient expects to be discharged to:: Other (Comment)                                 Additional Comments: Resident at [facility]. All PLOF and home set up confirmed by daughter, who is a [facility] SLP per chart review      Prior  Functioning/Environment Level of Independence: Independent with assistive device(s)        Comments: uses rollator and completes own dressing and bathing. Walks to meals        OT Problem List: Decreased strength;Decreased range of motion;Decreased activity tolerance;Impaired balance (sitting and/or standing);Pain;Impaired UE functional use;Decreased cognition;Decreased safety awareness;Decreased knowledge of use of DME or AE;Decreased knowledge of precautions;Cardiopulmonary status limiting activity      OT Treatment/Interventions: Therapeutic exercise;Self-care/ADL training;Neuromuscular education;Energy conservation;DME and/or AE instruction;Manual therapy;Modalities;Therapeutic activities;Cognitive remediation/compensation;Visual/perceptual remediation/compensation;Patient/family education;Balance training    OT Goals(Current goals can be found in the care plan section) Acute Rehab OT Goals Patient Stated Goal: none stated OT Goal Formulation: With patient/family Time For Goal Achievement: 12/04/18 Potential to Achieve Goals: Fair  OT Frequency: Min 2X/week    AM-PAC OT "6 Clicks" Daily Activity     Outcome Measure Help from another person eating meals?: A Little Help from another person taking care of personal grooming?: A Little Help from another person toileting, which includes using toliet, bedpan, or urinal?: A Lot Help from another person bathing (including washing, rinsing, drying)?: A Lot Help from another person to put on and taking off regular upper body clothing?: A Lot Help from another person to put on and taking off regular lower body clothing?: A Lot 6 Click Score: 14   End of Session Equipment Utilized During Treatment: Surveyor, mining Communication: Mobility status  Activity Tolerance: Patient tolerated treatment well Patient left: in chair;with call bell/phone within reach;with family/visitor present;with chair alarm set  OT Visit Diagnosis: Muscle  weakness (generalized) (M62.81);Unsteadiness on feet (R26.81)                Time: 7473-4037 OT Time Calculation (min): 58 min Charges:  OT General Charges $OT Visit: 1 Visit OT Evaluation $OT Eval Moderate Complexity: 1 Mod OT Treatments $Self Care/Home Management : 38-52 mins   Curtis Sites OTR/L  11/23/2018, 4:11 PM

## 2018-11-23 NOTE — Plan of Care (Signed)
Patient admitted for HCAP, IVF and Iv antibiotics, Mrsa +, contact precautions, productive cough, tele monitoring. Problem: Education: Goal: Knowledge of General Education information will improve Description Including pain rating scale, medication(s)/side effects and non-pharmacologic comfort measures Outcome: Progressing   Problem: Health Behavior/Discharge Planning: Goal: Ability to manage health-related needs will improve Outcome: Progressing   Problem: Clinical Measurements: Goal: Ability to maintain clinical measurements within normal limits will improve Outcome: Progressing Goal: Will remain free from infection Outcome: Progressing Goal: Diagnostic test results will improve Outcome: Progressing Goal: Respiratory complications will improve Outcome: Progressing Goal: Cardiovascular complication will be avoided Outcome: Progressing   Problem: Activity: Goal: Risk for activity intolerance will decrease Outcome: Progressing   Problem: Nutrition: Goal: Adequate nutrition will be maintained Outcome: Progressing   Problem: Coping: Goal: Level of anxiety will decrease Outcome: Progressing   Problem: Elimination: Goal: Will not experience complications related to bowel motility Outcome: Progressing Goal: Will not experience complications related to urinary retention Outcome: Progressing   Problem: Pain Managment: Goal: General experience of comfort will improve Outcome: Progressing   Problem: Safety: Goal: Ability to remain free from injury will improve Outcome: Progressing   Problem: Skin Integrity: Goal: Risk for impaired skin integrity will decrease Outcome: Progressing

## 2018-11-23 NOTE — Evaluation (Signed)
Physical Therapy Evaluation Patient Details Name: Anthony Hayes MRN: [id number] DOB: February 05, 1935 Today's Date: 11/23/2018   History of Present Illness  Pt is an 83 y.o. male admitted from ALF on 11/21/18 with productive cough, fever and chest pain. CXR showed cardiomegaly with L-sdied infiltration. Worked up for HCAP. PMH includes HTN, stroke, depression, CKD, dementia.     Clinical Impression  Pt presents with an overall decrease in functional mobility secondary to above. PTA, pt resides at ALF; ambulatory with RW. Pt required min-modA for transfers and ambulation with RW; pt disoriented with slowed processing and decreased safety awareness. Pt would benefit from continued acute PT services to maximize functional mobility and independence prior to d/c with SNF-level therapies.     Follow Up Recommendations SNF;Supervision/Assistance - 24 hour    Equipment Recommendations  None recommended by PT    Recommendations for Other Services       Precautions / Restrictions Precautions Precautions: Fall;Other (comment) Precaution Comments: Pt with possible L humeral fx, no restrictions Restrictions Weight Bearing Restrictions: No      Mobility  Bed Mobility Overal bed mobility: Needs Assistance Bed Mobility: Supine to Sit     Supine to sit: Max assist     General bed mobility comments: Received sitting in recliner  Transfers Overall transfer level: Needs assistance Equipment used: Rolling walker (2 wheeled) Transfers: Sit to/from Stand Sit to Stand: Mod assist Stand pivot transfers: Mod assist;From elevated surface       General transfer comment: ModA to assist trunk elevation and maintain balance; cues for hand placement  Ambulation/Gait Ambulation/Gait assistance: Min assist;Mod assist;Max assist Gait Distance (Feet): 30 Feet Assistive device: Rolling walker (2 wheeled) Gait Pattern/deviations: Step-through pattern;Decreased stride length;Leaning posteriorly;Staggering  right;Trunk flexed Gait velocity: Decreased Gait velocity interpretation: <1.31 ft/sec, indicative of household ambulator General Gait Details: Amb with min-modA to maintain balance; pt with R-lateral and posterior lean requiring intermittent cues and assist to correct. Frequent cues to maintain proximity to RW. Pt at one point lifting up R foot to point to something, requiring maxA to correct LOB when putting foot back on ground  Stairs            Wheelchair Mobility    Modified Rankin (Stroke Patients Only)       Balance Overall balance assessment: Needs assistance Sitting-balance support: Feet supported Sitting balance-[name] Scale: Fair   Postural control: Posterior lean Standing balance support: During functional activity;Bilateral upper extremity supported Standing balance-[name] Scale: Poor Standing balance comment: Reliant on UE support and external assist                             Pertinent Vitals/Pain Pain Assessment: No/denies pain    Home Living Family/patient expects to be discharged to:: Assisted living                 Additional Comments: Resident at [facility]. All PLOF and home set up confirmed by daughter, who is a [facility] SLP per chart review    Prior Function Level of Independence: Independent with assistive device(s)         Comments: uses rollator and completes own dressing and bathing. Walks to meals. Has had increasing falls recently     Hand Dominance   Dominant Hand: Left    Extremity/Trunk Assessment   Upper Extremity Assessment Upper Extremity Assessment: LUE deficits/detail;Generalized weakness LUE Deficits / Details: Pain from fx limiting mobility at [facility] joint    Lower  Extremity Assessment Lower Extremity Assessment: Generalized weakness    Cervical / Trunk Assessment Cervical / Trunk Assessment: Kyphotic  Communication   Communication: Expressive difficulties  Cognition Arousal/Alertness:  Awake/alert Behavior During Therapy: WFL for tasks assessed/performed Overall Cognitive Status: History of cognitive impairments - at baseline Area of Impairment: Orientation;Attention;Memory;Following commands;Safety/judgement;Awareness;Problem solving                 Orientation Level: Disoriented to;Place;Time;Situation Current Attention Level: Selective Memory: Decreased short-term memory Following Commands: Follows one step commands with increased time;Follows one step commands inconsistently Safety/Judgement: Decreased awareness of safety;Decreased awareness of deficits Awareness: Intellectual Problem Solving: Slow processing;Requires verbal cues;Difficulty sequencing        General Comments General comments (skin integrity, edema, etc.): Pt's daughter present throughout    Exercises     Assessment/Plan    PT Assessment Patient needs continued PT services  PT Problem List Decreased strength;Decreased activity tolerance;Decreased balance;Decreased mobility;Decreased cognition;Decreased knowledge of use of DME;Decreased safety awareness       PT Treatment Interventions DME instruction;Gait training;Stair training;Functional mobility training;Therapeutic activities;Therapeutic exercise;Balance training;Patient/family education    PT Goals (Current goals can be found in the Care Plan section)  Acute Rehab PT Goals Patient Stated Goal: none stated PT Goal Formulation: With patient Time For Goal Achievement: 12/07/18    Frequency Min 2X/week   Barriers to discharge        Co-evaluation               AM-PAC PT "6 Clicks" Mobility  Outcome Measure Help needed turning from your back to your side while in a flat bed without using bedrails?: A Little Help needed moving from lying on your back to sitting on the side of a flat bed without using bedrails?: A Little Help needed moving to and from a bed to a chair (including a wheelchair)?: A Lot Help needed  standing up from a chair using your arms (e.g., wheelchair or bedside chair)?: A Lot Help needed to walk in hospital room?: A Lot Help needed climbing 3-5 steps with a railing? : A Lot 6 Click Score: 14    End of Session Equipment Utilized During Treatment: Gait belt Activity Tolerance: Patient tolerated treatment well Patient left: in chair;with call bell/phone within reach;with chair alarm set Nurse Communication: Mobility status PT Visit Diagnosis: Other abnormalities of gait and mobility (R26.89);Muscle weakness (generalized) (M62.81);Repeated falls (R29.6)    Time: 1550-1611 PT Time Calculation (min) (ACUTE ONLY): 21 min   Charges:   PT Evaluation $PT Eval Moderate Complexity: [place], PT, DPT Acute Rehabilitation Services  Pager [phone] Office [place] 11/23/2018, 4:27 PM

## 2018-11-23 NOTE — Progress Notes (Signed)
PROGRESS NOTE  Anthony Hayes UKG:[id number] DOB: 1935/07/12 DOA: 11/21/2018 PCP: Etter Sjogren, DO   LOS: 1 day   Brief Narrative / Interim history: Patient is a 83 year old male with hypertension, hyperlipidemia, stroke, GERD, hypothyroidism, depression, CKD, dementia presented with fevers cough, chest pain.  Patient was having symptoms for last 5 days with yellowish colored sputum.  WBC is 13 chest x-ray showed cardiomegaly with mild diffuse pulmonary interstitial changes, left-sided infiltration.  Patient was admitted for further work-up.  Subjective: -Pleasantly confused, denies any cough or chest congestion, no chest pain, no abdominal pain, no nausea or vomiting  Assessment & Plan: Principal Problem:   HCAP (healthcare-associated pneumonia) Active Problems:   GERD (gastroesophageal reflux disease)   Chronic kidney disease, stage III (moderate) (HCC)   Dyslipidemia   CVA (cerebral vascular accident) ([place])   Hypothyroidism   Benign essential HTN   Hypokalemia   Fall   Chronic diastolic CHF (congestive heart failure) ([place])   Sepsis with healthcare associated pneumonia/H CAP -Patient met criteria for sepsis with leukocytosis, fever, tachypnea, lactic acidosis, chest x-ray with possible infiltration. -Continue broad-spectrum IV antibiotics, gentle hydration, follow blood cultures, sputum cultures, urine Legionella antigen, urine strep antigen -Continue duo nebs, Mucinex -Influenza panel negative -Sputum cultures with gram-positive cocci and gram-positive rods, speciation pending meanwhile maintain on broad-spectrum IV antibiotics with Vanco cefepime and azithromycin -Still febrile overnight last night  Active problems Chronic kidney disease stage III -Follow BMET, creatinine currently at baseline, 1.1, recheck tomorrow morning  GERD -Continue PPI  History of CVA -Continue Plavix and statin  History of hyperlipidemia -Continue statin  Hypothyroidism -Continue  Synthroid  Chronic diastolic CHF -2D echo 7/62 showed EF of 55 to 60% with grade 1 diastolic dysfunction -Stop IV fluids today  Essential hypertension -Continue hydralazine, blood pressure slightly on the high side we will continue to monitor  Mechanical fall -CT head showed chronic small vessel ischemia and generalized atrophy otherwise no intracranial abnormality  Scheduled Meds: . Chlorhexidine Gluconate Cloth  6 each Topical Q0600  . clopidogrel  75 mg Oral Daily  . [START ON 12/06/2018] cyanocobalamin  1,000 mcg Intramuscular Q30 days  . enoxaparin (LOVENOX) injection  40 mg Subcutaneous Daily  . escitalopram  5 mg Oral Daily  . hydrALAZINE  25 mg Oral TID  . ipratropium-albuterol  3 mL Nebulization Q4H  . levothyroxine  50 mcg Oral QAC breakfast  . mupirocin ointment  1 application Nasal BID  . pantoprazole  40 mg Oral BID  . pravastatin  10 mg Oral q1800  . sertraline  100 mg Oral Daily   Continuous Infusions: . sodium chloride    . azithromycin Stopped (11/23/18 0047)  . ceFEPime (MAXIPIME) IV 1 g (11/23/18 0846)  . vancomycin Stopped (11/23/18 0556)   PRN Meds:.sodium chloride, acetaminophen, albuterol, dextromethorphan-guaiFENesin, hydrALAZINE, hydrOXYzine, loratadine, zolpidem  DVT prophylaxis: Lovenox Code Status: Full code Family Communication: Daughter at bedside Disposition Plan: Back to ALF versus SNF, PT pending  Consultants:   None  Procedures:   None   Antimicrobials:  Vancomycin / Cefepime / Azithro 11/25 >   Objective: Vitals:   11/22/18 2343 11/23/18 0326 11/23/18 0737 11/23/18 0938  BP:   (!) 166/77   Pulse:   61   Resp:   18   Temp:   97.9 F (36.6 C)   TempSrc:   Oral   SpO2: 95% 95% 93% 95%  Weight:      Height:        Intake/Output Summary (Last  24 hours) at 11/23/2018 1518 Last data filed at 11/23/2018 0556 Gross per 24 hour  Intake 2360.71 ml  Output 1050 ml  Net 1310.71 ml   Filed Weights   11/22/18 1451    Weight: 76.9 kg    Examination:  Constitutional: NAD Eyes:  lids and conjunctivae normal ENMT: Mucous membranes are moist. Neck: normal, supple Respiratory: No wheezing or crackles heard, increased respiratory effort, diminished sounds at the bases Cardiovascular: Regular rate and rhythm, no murmurs / rubs / gallops.  Trace LE edema. 2+ pedal pulses.  Abdomen: no tenderness. Bowel sounds positive.  Musculoskeletal: no clubbing / cyanosis. Skin: no rashes Neurologic: Nonfocal   Data Reviewed: I have independently reviewed following labs and imaging studies   CBC: Recent Labs  Lab 11/21/18 2059  WBC 13.6*  HGB 12.4*  HCT 38.5*  MCV 88.5  PLT 272   Basic Metabolic Panel: Recent Labs  Lab 11/21/18 2059  NA 137  K 3.3*  CL 103  CO2 22  GLUCOSE 99  BUN 18  CREATININE 1.17  CALCIUM 9.3   GFR: Estimated Creatinine Clearance: 46.4 mL/min (by C-G formula based on SCr of 1.17 mg/dL). Liver Function Tests: No results for input(s): AST, ALT, ALKPHOS, BILITOT, PROT, ALBUMIN in the last 168 hours. No results for input(s): LIPASE, AMYLASE in the last 168 hours. No results for input(s): AMMONIA in the last 168 hours. Coagulation Profile: No results for input(s): INR, PROTIME in the last 168 hours. Cardiac Enzymes: No results for input(s): CKTOTAL, CKMB, CKMBINDEX, TROPONINI in the last 168 hours. BNP (last 3 results) Recent Labs    09/08/18 1446  PROBNP 116   HbA1C: No results for input(s): HGBA1C in the last 72 hours. CBG: No results for input(s): GLUCAP in the last 168 hours. Lipid Profile: No results for input(s): CHOL, HDL, LDLCALC, TRIG, CHOLHDL, LDLDIRECT in the last 72 hours. Thyroid Function Tests: No results for input(s): TSH, T4TOTAL, FREET4, T3FREE, THYROIDAB in the last 72 hours. Anemia Panel: No results for input(s): VITAMINB12, FOLATE, FERRITIN, TIBC, IRON, RETICCTPCT in the last 72 hours. Urine analysis:    Component Value Date/Time   COLORURINE  STRAW (A) 11/02/2018 0849   APPEARANCEUR CLEAR 11/02/2018 0849   LABSPEC 1.013 11/02/2018 0849   PHURINE 5.0 11/02/2018 0849   GLUCOSEU NEGATIVE 11/02/2018 0849   HGBUR SMALL (A) 11/02/2018 0849   BILIRUBINUR NEGATIVE 11/02/2018 0849   BILIRUBINUR NEG 01/21/2018 2022   KETONESUR NEGATIVE 11/02/2018 0849   PROTEINUR 100 (A) 11/02/2018 0849   UROBILINOGEN 1.0 01/21/2018 2022   NITRITE NEGATIVE 11/02/2018 0849   LEUKOCYTESUR NEGATIVE 11/02/2018 0849   Sepsis Labs: Invalid input(s): PROCALCITONIN, LACTICIDVEN  Recent Results (from the past 240 hour(s))  Blood Culture (routine x 2)     Status: None (Preliminary result)   Collection Time: 11/21/18  9:02 PM  Result Value Ref Range Status   Specimen Description BLOOD LEFT ANTECUBITAL  Final   Special Requests   Final    BOTTLES DRAWN AEROBIC AND ANAEROBIC Blood Culture adequate volume   Culture   Final    NO GROWTH 2 DAYS Performed at [facility], [address], [place], [place] [address]    Report Status PENDING  Incomplete  Blood Culture (routine x 2)     Status: None (Preliminary result)   Collection Time: 11/21/18 11:20 PM  Result Value Ref Range Status   Specimen Description BLOOD RIGHT ARM  Final   Special Requests   Final    BOTTLES  DRAWN AEROBIC AND ANAEROBIC Blood Culture results may not be optimal due to an excessive volume of blood received in culture bottles   Culture   Final    NO GROWTH 2 DAYS Performed at [facility] [address], [place], [place] [address]    Report Status PENDING  Incomplete  MRSA PCR Screening     Status: Abnormal   Collection Time: 11/22/18  6:52 PM  Result Value Ref Range Status   MRSA by PCR POSITIVE (A) NEGATIVE Final    Comment:        The GeneXpert MRSA Assay (FDA approved for NASAL specimens only), is one component of a comprehensive MRSA colonization surveillance program. It is not intended to diagnose MRSA infection nor to guide or monitor treatment for MRSA  infections. RESULT CALLED TO, READ BACK BY AND VERIFIED WITHLind Guest RN 11/22/18 85027 JDW Performed at [facility], [place] [address], [place], [place] [address]   Culture, sputum-assessment     Status: None   Collection Time: 11/22/18  7:00 PM  Result Value Ref Range Status   Specimen Description EXPECTORATED SPUTUM  Final   Special Requests NONE  Final   Sputum evaluation   Final    THIS SPECIMEN IS ACCEPTABLE FOR SPUTUM CULTURE Performed at [facility], [place] [address], [place], [place] [address]    Report Status 11/22/2018 FINAL  Final  Culture, respiratory     Status: None (Preliminary result)   Collection Time: 11/22/18  7:00 PM  Result Value Ref Range Status   Specimen Description EXPECTORATED SPUTUM  Final   Special Requests NONE Reflexed from [phone]  Final   Gram Stain   Final    FEW WBC PRESENT, PREDOMINANTLY PMN RARE GRAM POSITIVE COCCI RARE GRAM POSITIVE RODS    Culture   Final    NO GROWTH < 12 HOURS Performed at [facility], [place] [address], [place], [place] [address]    Report Status PENDING  Incomplete      Radiology Studies: Dg Chest 2 View  Result Date: 11/21/2018 CLINICAL DATA:  Initial evaluation for acute chest pain. EXAM: CHEST - 2 VIEW COMPARISON:  Prior radiograph from earlier the same day. FINDINGS: Moderate cardiomegaly.  Mediastinal silhouette within normal limits. Lungs are hypoinflated. Curvilinear left basilar opacity most consistent with atelectasis. Additional atelectatic changes at the right lung base. Diffuse pulmonary vascular and interstitial congestion, consistent with mild diffuse pulmonary interstitial edema. No appreciable pleural effusion. No other focal infiltrates. No pneumothorax. No acute osseous abnormality. IMPRESSION: 1. Cardiomegaly with mild diffuse pulmonary interstitial edema, suggesting CHF. 2. Low lung volumes with superimposed bibasilar atelectasis. Electronically Signed   By: Jeannine Boga M.D.   On:  11/21/2018 21:59   Ct Head Wo Contrast  Result Date: 11/22/2018 CLINICAL DATA:  Head trauma EXAM: CT HEAD WITHOUT CONTRAST TECHNIQUE: Contiguous axial images were obtained from the base of the skull through the vertex without intravenous contrast. COMPARISON:  11/02/2018 FINDINGS: Brain: No acute findings. There is generalized atrophy without lobar predilection. There is hypoattenuation of the periventricular white matter, most commonly indicating chronic ischemic microangiopathy. Vascular: No abnormal hyperdensity of the major intracranial arteries or dural venous sinuses. No intracranial atherosclerosis. Skull: The visualized skull base, calvarium and extracranial soft tissues are normal. Sinuses/Orbits: No fluid levels or advanced mucosal thickening of the visualized paranasal sinuses. No mastoid or middle ear effusion. The orbits are normal. IMPRESSION: A chronic small vessel ischemia and generalized atrophy  without acute intracranial abnormality. Electronically Signed   By: Ulyses Jarred M.D.   On: 11/22/2018 03:23   Dg Humerus Left  Result Date: 11/22/2018 CLINICAL DATA:  83 y/o  M; fall with pain. EXAM: LEFT HUMERUS - 2+ VIEW COMPARISON:  10/14/2018 left upper extremity radiographs. FINDINGS: Slight angulation at the surgical neck of the humerus may represent a minimally impacted fracture. Diminished acromial humeral distance compatible with underlying rotator cuff injury is stable. No joint dislocation. Osteoarthrosis of the acromioclavicular joint periarticular osteophytosis. IMPRESSION: Slight angulation at the surgical neck of the humerus may represent a minimally impacted fracture. No joint dislocation. Electronically Signed   By: Kristine Garbe M.D.   On: 11/22/2018 21:54     Marzetta Board, MD, PhD [facility] Pager [phone]  If 7PM-7AM, please contact night-coverage [email or web address] Password [facility] 11/23/2018, 3:18 PM

## 2018-11-24 LAB — BASIC METABOLIC PANEL
ANION GAP: 9 (ref 5–15)
BUN: 11 mg/dL (ref 8–23)
CALCIUM: 8.9 mg/dL (ref 8.9–10.3)
CO2: 22 mmol/L (ref 22–32)
Chloride: 107 mmol/L (ref 98–111)
Creatinine, Ser: 1.11 mg/dL (ref 0.61–1.24)
Glucose, Bld: 95 mg/dL (ref 70–99)
Potassium: 2.9 mmol/L — ABNORMAL LOW (ref 3.5–5.1)
SODIUM: 138 mmol/L (ref 135–145)

## 2018-11-24 LAB — CBC
HCT: 31.4 % — ABNORMAL LOW (ref 39.0–52.0)
Hemoglobin: 10.2 g/dL — ABNORMAL LOW (ref 13.0–17.0)
MCH: 28.3 pg (ref 26.0–34.0)
MCHC: 32.5 g/dL (ref 30.0–36.0)
MCV: 87 fL (ref 80.0–100.0)
NRBC: 0 % (ref 0.0–0.2)
PLATELETS: 293 10*3/uL (ref 150–400)
RBC: 3.61 MIL/uL — AB (ref 4.22–5.81)
RDW: 13.9 % (ref 11.5–15.5)
WBC: 7.7 10*3/uL (ref 4.0–10.5)

## 2018-11-24 LAB — LEGIONELLA PNEUMOPHILA SEROGP 1 UR AG: L. PNEUMOPHILA SEROGP 1 UR AG: NEGATIVE

## 2018-11-24 MED ORDER — AMLODIPINE BESYLATE 5 MG PO TABS
5.0000 mg | ORAL_TABLET | Freq: Every day | ORAL | Status: DC
  Administered 2018-11-24 – 2018-11-25 (×2): 5 mg via ORAL
  Filled 2018-11-24 (×2): qty 1

## 2018-11-24 MED ORDER — VANCOMYCIN HCL IN DEXTROSE 750-5 MG/150ML-% IV SOLN
750.0000 mg | Freq: Two times a day (BID) | INTRAVENOUS | Status: DC
  Administered 2018-11-25: 750 mg via INTRAVENOUS
  Filled 2018-11-24 (×2): qty 150

## 2018-11-24 MED ORDER — POTASSIUM CHLORIDE CRYS ER 20 MEQ PO TBCR
40.0000 meq | EXTENDED_RELEASE_TABLET | ORAL | Status: AC
  Administered 2018-11-24 (×2): 40 meq via ORAL
  Filled 2018-11-24 (×2): qty 2

## 2018-11-24 NOTE — NC FL2 (Signed)
[place] LEVEL OF CARE SCREENING TOOL     IDENTIFICATION  Patient Name: Anthony Hayes Birthdate: [birth year] Sex: male Admission Date (Current Location): 11/21/2018  [facility] and [place] Number:  Herbalist and Address:  [facility], [place] [address], [place], [place] [address]      Provider Number: [id number]  Attending Physician Name and Address:  Caren Griffins, MD  Relative Name and Phone Number:       Current Level of Care: Hospital Recommended Level of Care: [place] Prior Approval Number:    Date Approved/Denied:   PASRR Number: [id number]  Discharge Plan: SNF    Current Diagnoses: Patient Active Problem List   Diagnosis Date Noted  . HCAP (healthcare-associated pneumonia) 11/22/2018  . Fall 11/22/2018  . Chronic diastolic CHF (congestive heart failure) ([place]) 11/22/2018  . Hypoxia   . Bradycardia 10/28/2018  . Leg swelling 10/28/2018  . Irregular heart beat 09/08/2018  . Sleep disturbance   . Stage 3 chronic kidney disease ([place])   . Benign essential HTN   . Hypokalemia   . Hyponatremia   . Infarction of right basal ganglia ([place]) 04/06/2018  . Pressure injury of skin 04/06/2018  . Vitamin B12 deficiency   . Hypothyroidism   . Crohn's disease with complication ([place])   . Benign prostatic hyperplasia   . Acute lower UTI   . Dementia without behavioral disturbance ([place])   . Acute ischemic stroke ([place]) 04/05/2018  . Leukocytosis   . Shortness of breath   . Acute encephalopathy   . CVA (cerebral vascular accident) ([place]) 04/03/2018  . Calculus, kidney 01/21/2018  . Acute blood loss anemia   . Mallory-Weiss syndrome   . Esophageal stricture   . Respiratory failure ([place])   . UGIB (upper gastrointestinal bleed) 10/16/2017  . GERD (gastroesophageal reflux disease) 10/16/2017  . Essential hypertension, benign 10/16/2017  . Chest pain 10/15/2017  . Hypothyroidism (acquired) 06/08/2017  . Chronic  kidney disease, stage III (moderate) ([place]) 03/19/2016  . Dyslipidemia 03/19/2016  . Degeneration of lumbar or lumbosacral intervertebral disc 07/10/2008  . Arthropathy, lower leg 05/23/2004    Orientation RESPIRATION BLADDER Height & Weight     Self, Place, Situation  Normal Incontinent, External catheter(placed 11/26) Weight: 76.9 kg Height:  5' 4"  (162.6 cm)  BEHAVIORAL SYMPTOMS/MOOD NEUROLOGICAL BOWEL NUTRITION STATUS      Continent Diet(Please see DC Summary)  AMBULATORY STATUS COMMUNICATION OF NEEDS Skin   Limited Assist Verbally Normal                       Personal Care Assistance Level of Assistance  Bathing, Feeding, Dressing Bathing Assistance: Limited assistance Feeding assistance: Independent Dressing Assistance: Limited assistance     Functional Limitations Info  Sight, Hearing, Speech Sight Info: Adequate Hearing Info: Adequate Speech Info: Adequate    SPECIAL CARE FACTORS FREQUENCY  PT (By licensed PT), OT (By licensed OT)     PT Frequency: 5x/week OT Frequency: 3x/weel            Contractures Contractures Info: Not present    Additional Factors Info  Isolation Precautions Code Status Info: Full Code Allergies Info: Lisinopril     Isolation Precautions Info: MRSA     Current Medications (11/24/2018):  This is the current hospital active medication list Current Facility-Administered Medications  Medication Dose Route Frequency Provider Last Rate Last Dose  . 0.9 %  sodium chloride infusion   Intravenous PRN  Ivor Costa, MD      . acetaminophen (TYLENOL) tablet 650 mg  650 mg Oral Q6H PRN Ivor Costa, MD   650 mg at 11/23/18 2150  . albuterol (PROVENTIL) (2.5 MG/3ML) 0.083% nebulizer solution 2.5 mg  2.5 mg Nebulization Q4H PRN Ivor Costa, MD      . azithromycin (ZITHROMAX) 500 mg in sodium chloride 0.9 % 250 mL IVPB  500 mg Intravenous Q24H Ivor Costa, MD 250 mL/hr at 11/23/18 2313 500 mg at 11/23/18 2313  . ceFEPIme (MAXIPIME) 1 g in sodium  chloride 0.9 % 100 mL IVPB  1 g Intravenous Q12H Ivor Costa, MD 200 mL/hr at 11/24/18 0850 1 g at 11/24/18 0850  . Chlorhexidine Gluconate Cloth 2 % PADS 6 each  6 each Topical Q0600 Ivor Costa, MD   6 each at 11/24/18 0530  . clopidogrel (PLAVIX) tablet 75 mg  75 mg Oral Daily Ivor Costa, MD   75 mg at 11/24/18 0847  . [START ON 12/06/2018] cyanocobalamin ((VITAMIN B-12)) injection 1,000 mcg  1,000 mcg Intramuscular Q30 days Ivor Costa, MD      . dextromethorphan-guaiFENesin [facility] DM) 30-600 MG per 12 hr tablet 1 tablet  1 tablet Oral BID PRN Ivor Costa, MD      . enoxaparin (LOVENOX) injection 40 mg  40 mg Subcutaneous Daily Ivor Costa, MD   40 mg at 11/24/18 0844  . escitalopram (LEXAPRO) tablet 5 mg  5 mg Oral Daily Ivor Costa, MD   5 mg at 11/24/18 0846  . hydrALAZINE (APRESOLINE) injection 5 mg  5 mg Intravenous Q2H PRN Ivor Costa, MD      . hydrALAZINE (APRESOLINE) tablet 25 mg  25 mg Oral TID Ivor Costa, MD   25 mg at 11/24/18 0846  . hydrOXYzine (ATARAX/VISTARIL) tablet 10 mg  10 mg Oral TID PRN Ivor Costa, MD      . ipratropium-albuterol (DUONEB) 0.5-2.5 (3) MG/3ML nebulizer solution 3 mL  3 mL Nebulization TID Caren Griffins, MD      . levothyroxine (SYNTHROID, LEVOTHROID) tablet 50 mcg  50 mcg Oral QAC breakfast Ivor Costa, MD   50 mcg at 11/24/18 0847  . loratadine (CLARITIN) tablet 10 mg  10 mg Oral Daily PRN Ivor Costa, MD      . mupirocin ointment (BACTROBAN) 2 % 1 application  1 application Nasal BID Ivor Costa, MD   1 application at 45/62/56 0855  . pantoprazole (PROTONIX) EC tablet 40 mg  40 mg Oral BID Ivor Costa, MD   40 mg at 11/24/18 0846  . potassium chloride SA (K-DUR,KLOR-CON) CR tablet 40 mEq  40 mEq Oral Q3H Caren Griffins, MD   40 mEq at 11/24/18 0928  . pravastatin (PRAVACHOL) tablet 10 mg  10 mg Oral q1800 Ivor Costa, MD   10 mg at 11/23/18 1815  . sertraline (ZOLOFT) tablet 100 mg  100 mg Oral Daily Ivor Costa, MD   100 mg at 11/24/18 0846  . [START ON  11/25/2018] vancomycin (VANCOCIN) IVPB 750 mg/150 ml premix  750 mg Intravenous Q12H Makhlouf, Tanya K, RPH      . zolpidem (AMBIEN) tablet 5 mg  5 mg Oral QHS PRN Ivor Costa, MD   5 mg at 11/23/18 2150     Discharge Medications: Please see discharge summary for a list of discharge medications.  Relevant Imaging Results:  Relevant Lab Results:   Additional Information [id number]  Benard Halsted, LCSW

## 2018-11-24 NOTE — Clinical Social Work Note (Signed)
Clinical Social Work Assessment  Patient Details  Name: Anthony Hayes MRN: [id number] Date of Birth: 12/23/1935  Date of referral:  11/24/18               Reason for consult:  Discharge Planning, Facility Placement                Permission sought to share information with:  Family Supports Permission granted to share information::  Yes, Verbal Permission Granted  Name::     [place]::  snf  Relationship::  daughter  Contact Information:  [phone]  Housing/Transportation Living arrangements for the past 2 months:  [place] of Information:  Adult Children Patient Interpreter Needed:  None Criminal Activity/Legal Involvement Pertinent to Current Situation/Hospitalization:  No - Comment as needed Significant Relationships:  Adult Children Lives with:  Facility Resident(from [facility]) Do you feel safe going back to the place where you live?  Yes Need for family participation in patient care:     Care giving concerns:  No family at bedside. Patient is only orient to self so CSW contacted patients daughter via phone. Per daughter Anthony Hayes patient is from [place]   Social Worker assessment / plan:  CSW spoke with daughter Anthony Hayes via phone. Anthony Hayes stated she would like patient to go to a rehab facility in [place] since her sister will be out of town for a week. Anthony Hayes stated she would like patient to be close to her during his SNF stay. Whitney gave CSW a list of 3 facility in the [place] area to contact for bed avalability  Employment status:  Retired Forensic scientist:  Medicare PT Recommendations:  [place] / Referral to community resources:  [place]  Patient/Family's Response to care:  Family appreciates CSW role in care  Patient/Family's Understanding of and Emotional Response to Diagnosis, Current Treatment, and Prognosis:  Family would like patient to go to rehab in [place]  to be closer to [employer]. Anthony Hayes stated they will transport patient to the rehab facility.  Emotional Assessment Appearance:  Appears stated age Attitude/Demeanor/Rapport:  Unable to Assess Affect (typically observed):  Unable to Assess Orientation:  Oriented to Self Alcohol / Substance use:  Not Applicable Psych involvement (Current and /or in the community):  No (Comment)  Discharge Needs  Concerns to be addressed:  Care Coordination Readmission within the last 30 days:  No Current discharge risk:  Dependent with Mobility Barriers to Discharge:  Continued Medical Work up   [employer], LCSW 11/24/2018, 11:09 AM

## 2018-11-24 NOTE — Progress Notes (Signed)
PHARMACY NOTE:  ANTIMICROBIAL RENAL DOSAGE ADJUSTMENT  Current antimicrobial regimen includes a mismatch between antimicrobial dosage and estimated renal function.  As per policy approved by the Pharmacy & Therapeutics and Medical Executive Committees, the antimicrobial dosage will be adjusted accordingly.  Current antimicrobial dosage:  Vancomycin 1g Q24hrs  Indication: sepsis with HCAP  Renal Function: CrCl 48.44m/min, Scr trending down to 1.11  Estimated Creatinine Clearance: 48.9 mL/min (by C-G formula based on SCr of 1.11 mg/dL). []      On intermittent HD, scheduled: []      On CRRT    Antimicrobial dosage has been changed to:  Vancomycin 7571mQ12hrs  Additional comments: will continue to monitor cultures, plans for de-escalation, renal function, and trough when indicated.    Thank you for allowing pharmacy to be a part of this patient's care.  TaJuanell FairlyPharmD PGY1 Pharmacy Resident Phone ([phone]/28/2019 9:32 AM

## 2018-11-24 NOTE — Progress Notes (Signed)
PROGRESS NOTE  Anthony Hayes [id number]:[id number] DOB: April 26, 1935 DOA: 11/21/2018 PCP: Etter Sjogren, DO   LOS: 2 days   Brief Narrative / Interim history: Patient is a 83 year old male with hypertension, hyperlipidemia, stroke, GERD, hypothyroidism, depression, CKD, dementia presented with fevers cough, chest pain.  Patient was having symptoms for last 5 days with yellowish colored sputum.  WBC is 13 chest x-ray showed cardiomegaly with mild diffuse pulmonary interstitial changes, left-sided infiltration.  Patient was admitted for further work-up.  Subjective: -Fever resolved, he is alert, pleasantly confused denies any complaints  Assessment & Plan: Principal Problem:   HCAP (healthcare-associated pneumonia) Active Problems:   GERD (gastroesophageal reflux disease)   Chronic kidney disease, stage III (moderate) (HCC)   Dyslipidemia   CVA (cerebral vascular accident) ([place])   Hypothyroidism   Benign essential HTN   Hypokalemia   Fall   Chronic diastolic CHF (congestive heart failure) ([place])   Sepsis with healthcare associated pneumonia/H CAP -Patient met criteria for sepsis with leukocytosis, fever, tachypnea, lactic acidosis, chest x-ray with possible infiltration. -Continue broad-spectrum IV antibiotics, gentle hydration, follow blood cultures, sputum cultures, urine Legionella antigen, urine strep antigen -Continue duo nebs, Mucinex -Influenza panel negative -Sputum cultures with gram-positive cocci and gram-positive rods, speciation pending meanwhile maintain on broad-spectrum IV antibiotics with Vanco cefepime and azithromycin sure reintubated for better growth today -Fever resolved  Active problems Chronic kidney disease stage III -Follow BMET, creatinine has remained stable this morning  GERD -Continue PPI  History of CVA -Continue Plavix and statin  History of hyperlipidemia -Continue statin  Hypothyroidism -Continue Synthroid  Chronic diastolic CHF -2D  echo 0/94 showed EF of 55 to 60% with grade 1 diastolic dysfunction -Stop IV fluids today  Essential hypertension -Continue hydralazine, add low-dose Norvasc today for persistent hypotension  Mechanical fall -CT head showed chronic small vessel ischemia and generalized atrophy otherwise no intracranial abnormality   Scheduled Meds: . Chlorhexidine Gluconate Cloth  6 each Topical Q0600  . clopidogrel  75 mg Oral Daily  . [START ON 12/06/2018] cyanocobalamin  1,000 mcg Intramuscular Q30 days  . enoxaparin (LOVENOX) injection  40 mg Subcutaneous Daily  . escitalopram  5 mg Oral Daily  . hydrALAZINE  25 mg Oral TID  . ipratropium-albuterol  3 mL Nebulization TID  . levothyroxine  50 mcg Oral QAC breakfast  . mupirocin ointment  1 application Nasal BID  . pantoprazole  40 mg Oral BID  . potassium chloride  40 mEq Oral Q3H  . pravastatin  10 mg Oral q1800  . sertraline  100 mg Oral Daily   Continuous Infusions: . sodium chloride    . azithromycin 500 mg (11/23/18 2313)  . ceFEPime (MAXIPIME) IV 1 g (11/24/18 0850)  . [START ON 11/25/2018] vancomycin     PRN Meds:.sodium chloride, acetaminophen, albuterol, dextromethorphan-guaiFENesin, hydrALAZINE, hydrOXYzine, loratadine, zolpidem  DVT prophylaxis: Lovenox Code Status: Full code Family Communication: Daughter at bedside Disposition Plan: Back to ALF versus SNF, PT pending  Consultants:   None  Procedures:   None   Antimicrobials:  Vancomycin / Cefepime / Azithro 11/25 >   Objective: Vitals:   11/23/18 2029 11/23/18 2311 11/24/18 0806 11/24/18 0949  BP:  (!) 157/105 (!) 186/70   Pulse:  60 (!) 52 71  Resp:  18  18  Temp:  97.6 F (36.4 C) (!) 97.5 F (36.4 C)   TempSrc:  Oral Oral   SpO2: 96% 95% 98% 94%  Weight:      Height:  Intake/Output Summary (Last 24 hours) at 11/24/2018 1136 Last data filed at 11/24/2018 4496 Gross per 24 hour  Intake -  Output 725 ml  Net -725 ml   Filed Weights    11/22/18 1451  Weight: 76.9 kg    Examination:  Constitutional: NAD Eyes: no scleral icterus  ENMT: mmm Respiratory: No wheezing or crackles, slightly increased respiratory effort Cardiovascular: Regular rate and rhythm, no murmurs appreciated.  Trace edema. Abdomen: Soft, nontender, nondistended, positive bowel sounds Musculoskeletal: no clubbing / cyanosis. Skin: No rashes seen Neurologic: Equal strength   Data Reviewed: I have independently reviewed following labs and imaging studies   CBC: Recent Labs  Lab 11/21/18 2059 11/24/18 0422  WBC 13.6* 7.7  HGB 12.4* 10.2*  HCT 38.5* 31.4*  MCV 88.5 87.0  PLT 326 759   Basic Metabolic Panel: Recent Labs  Lab 11/21/18 2059 11/24/18 0422  NA 137 138  K 3.3* 2.9*  CL 103 107  CO2 22 22  GLUCOSE 99 95  BUN 18 11  CREATININE 1.17 1.11  CALCIUM 9.3 8.9   GFR: Estimated Creatinine Clearance: 48.9 mL/min (by C-G formula based on SCr of 1.11 mg/dL). Liver Function Tests: No results for input(s): AST, ALT, ALKPHOS, BILITOT, PROT, ALBUMIN in the last 168 hours. No results for input(s): LIPASE, AMYLASE in the last 168 hours. No results for input(s): AMMONIA in the last 168 hours. Coagulation Profile: No results for input(s): INR, PROTIME in the last 168 hours. Cardiac Enzymes: No results for input(s): CKTOTAL, CKMB, CKMBINDEX, TROPONINI in the last 168 hours. BNP (last 3 results) Recent Labs    09/08/18 1446  PROBNP 116   HbA1C: No results for input(s): HGBA1C in the last 72 hours. CBG: No results for input(s): GLUCAP in the last 168 hours. Lipid Profile: No results for input(s): CHOL, HDL, LDLCALC, TRIG, CHOLHDL, LDLDIRECT in the last 72 hours. Thyroid Function Tests: No results for input(s): TSH, T4TOTAL, FREET4, T3FREE, THYROIDAB in the last 72 hours. Anemia Panel: No results for input(s): VITAMINB12, FOLATE, FERRITIN, TIBC, IRON, RETICCTPCT in the last 72 hours. Urine analysis:    Component Value Date/Time     COLORURINE STRAW (A) 11/02/2018 0849   APPEARANCEUR CLEAR 11/02/2018 0849   LABSPEC 1.013 11/02/2018 0849   PHURINE 5.0 11/02/2018 0849   GLUCOSEU NEGATIVE 11/02/2018 0849   HGBUR SMALL (A) 11/02/2018 0849   BILIRUBINUR NEGATIVE 11/02/2018 0849   BILIRUBINUR NEG 01/21/2018 2022   KETONESUR NEGATIVE 11/02/2018 0849   PROTEINUR 100 (A) 11/02/2018 0849   UROBILINOGEN 1.0 01/21/2018 2022   NITRITE NEGATIVE 11/02/2018 0849   LEUKOCYTESUR NEGATIVE 11/02/2018 0849   Sepsis Labs: Invalid input(s): PROCALCITONIN, LACTICIDVEN  Recent Results (from the past 240 hour(s))  Blood Culture (routine x 2)     Status: None (Preliminary result)   Collection Time: 11/21/18  9:02 PM  Result Value Ref Range Status   Specimen Description BLOOD LEFT ANTECUBITAL  Final   Special Requests   Final    BOTTLES DRAWN AEROBIC AND ANAEROBIC Blood Culture adequate volume   Culture   Final    NO GROWTH 3 DAYS Performed at [facility], [address], [place], [place] [address]    Report Status PENDING  Incomplete  Blood Culture (routine x 2)     Status: None (Preliminary result)   Collection Time: 11/21/18 11:20 PM  Result Value Ref Range Status   Specimen Description BLOOD RIGHT ARM  Final   Special Requests   Final  BOTTLES DRAWN AEROBIC AND ANAEROBIC Blood Culture results may not be optimal due to an excessive volume of blood received in culture bottles   Culture   Final    NO GROWTH 3 DAYS Performed at [facility], [place] [address], [place], [place] [address]    Report Status PENDING  Incomplete  MRSA PCR Screening     Status: Abnormal   Collection Time: 11/22/18  6:52 PM  Result Value Ref Range Status   MRSA by PCR POSITIVE (A) NEGATIVE Final    Comment:        The GeneXpert MRSA Assay (FDA approved for NASAL specimens only), is one component of a comprehensive MRSA colonization surveillance program. It is not intended to diagnose MRSA infection nor to guide or monitor  treatment for MRSA infections. RESULT CALLED TO, READ BACK BY AND VERIFIED WITHLind Guest RN 11/22/18 88502 JDW Performed at [facility], [place] [address], [place], [place] [address]   Culture, sputum-assessment     Status: None   Collection Time: 11/22/18  7:00 PM  Result Value Ref Range Status   Specimen Description EXPECTORATED SPUTUM  Final   Special Requests NONE  Final   Sputum evaluation   Final    THIS SPECIMEN IS ACCEPTABLE FOR SPUTUM CULTURE Performed at [facility], [place] [address], [place], [place] [address]    Report Status 11/22/2018 FINAL  Final  Culture, respiratory     Status: None (Preliminary result)   Collection Time: 11/22/18  7:00 PM  Result Value Ref Range Status   Specimen Description EXPECTORATED SPUTUM  Final   Special Requests NONE Reflexed from [id number]  Final   Gram Stain   Final    FEW WBC PRESENT, PREDOMINANTLY PMN RARE GRAM POSITIVE COCCI RARE GRAM POSITIVE RODS    Culture   Final    CULTURE REINCUBATED FOR BETTER GROWTH Performed at [facility], [place] [address], [place], [place] [address]    Report Status PENDING  Incomplete      Radiology Studies: Dg Humerus Left  Result Date: 11/22/2018 CLINICAL DATA:  83 y/o  M; fall with pain. EXAM: LEFT HUMERUS - 2+ VIEW COMPARISON:  10/14/2018 left upper extremity radiographs. FINDINGS: Slight angulation at the surgical neck of the humerus may represent a minimally impacted fracture. Diminished acromial humeral distance compatible with underlying rotator cuff injury is stable. No joint dislocation. Osteoarthrosis of the acromioclavicular joint periarticular osteophytosis. IMPRESSION: Slight angulation at the surgical neck of the humerus may represent a minimally impacted fracture. No joint dislocation. Electronically Signed   By: Kristine Garbe M.D.   On: 11/22/2018 21:54     Marzetta Board, MD, PhD [facility] Pager [phone]  If 7PM-7AM, please contact  night-coverage [email or web address] Password [facility] 11/24/2018, 11:36 AM

## 2018-11-25 LAB — CULTURE, RESPIRATORY W GRAM STAIN: Culture: NORMAL

## 2018-11-25 LAB — BASIC METABOLIC PANEL
Anion gap: 6 (ref 5–15)
BUN: 13 mg/dL (ref 8–23)
CHLORIDE: 109 mmol/L (ref 98–111)
CO2: 21 mmol/L — AB (ref 22–32)
CREATININE: 0.95 mg/dL (ref 0.61–1.24)
Calcium: 9 mg/dL (ref 8.9–10.3)
GFR calc non Af Amer: >60 mL/min (ref >60)
Glucose, Bld: 102 mg/dL — ABNORMAL HIGH (ref 70–99)
POTASSIUM: 3.4 mmol/L — AB (ref 3.5–5.1)
Sodium: 136 mmol/L (ref 135–145)

## 2018-11-25 LAB — CULTURE, RESPIRATORY

## 2018-11-25 MED ORDER — BISACODYL 10 MG RE SUPP: 10.0000 mg | Freq: Once | RECTAL | Status: DC

## 2018-11-25 MED ORDER — AMLODIPINE BESYLATE 5 MG PO TABS: 5.0000 mg | ORAL_TABLET | Freq: Every day | ORAL | Status: DC

## 2018-11-25 MED ORDER — CEFDINIR 300 MG PO CAPS: 300.0000 mg | ORAL_CAPSULE | Freq: Two times a day (BID) | ORAL | 0 refills | Status: DC

## 2018-11-25 NOTE — Clinical Social Work Placement (Signed)
   CLINICAL SOCIAL WORK PLACEMENT  NOTE  Date:  11/25/2018  Patient Details  Name: Anthony Hayes MRN: [id number] Date of Birth: 04/29/1935  Clinical Social Work is seeking post-discharge placement for this patient at the [place] level of care (*CSW will initial, date and re-position this form in  chart as items are completed):      Patient/family provided with [facility] Work Department's list of facilities offering this level of care within the geographic area requested by the patient (or if unable, by the patient's family).  Yes   Patient/family informed of their freedom to choose among providers that offer the needed level of care, that participate in Medicare, Medicaid or managed care program needed by the patient, have an available bed and are willing to accept the patient.      Patient/family informed of [facility]'s ownership interest in [facility] and [facility], as well as of the fact that they are under no obligation to receive care at these facilities.  PASRR submitted to EDS on       PASRR number received on       Existing PASRR number confirmed on 11/24/18     FL2 transmitted to all facilities in geographic area requested by pt/family on 11/24/18     FL2 transmitted to all facilities within larger geographic area on       Patient informed that his/her managed care company has contracts with or will negotiate with certain facilities, including the following:        Yes   Patient/family informed of bed offers received.  Patient chooses bed at [facility])     Physician recommends and patient chooses bed at      Patient to be transferred to ([facility]) on 11/25/18.  Patient to be transferred to facility by pt's daughter will transport     Patient family notified on 11/25/18 of transfer.  Name of family member notified:  pt's daughter present at bedside      PHYSICIAN       Additional Comment:    _______________________________________________ Eileen Stanford, LCSW 11/25/2018, 11:42 AM

## 2018-11-25 NOTE — Discharge Summary (Signed)
Physician Discharge Summary  Anthony Hayes JSE:[id number] DOB: July 31, 1935 DOA: 11/21/2018  PCP: Etter Sjogren, DO  Admit date: 11/21/2018 Discharge date: 11/25/2018  Admitted From: ALF Disposition:  SNF  Recommendations for Outpatient Follow-up:  1. Follow up with PCP in 1-2 weeks 2. Continue Omnicef for 5 additional days  Home Health: none Equipment/Devices: none  Discharge Condition: stable CODE STATUS: Full code Diet recommendation: regular  HPI: Per Dr. Blaine Hamper, Anthony Hayes is a 83 y.o. male with medical history significant of hypertension, hyperlipidemia, stroke, GERD, hypothyroidism, depression, dementia, CKD 3, who presents with cough, fever, chest pain. Pt has dementia, and cannot provide accurate medical history.  I called her daughter at home, who provided most of the medical history.  Per her daughter, patient has been having cough, fever, chills in the past 5 days, which has worsened today.  Patient coughs up yellow-colored sputum.  Patient has some chest pain on coughing per her daughter.  Patient fell in this morning, but no significant injury noted.  Did not pass out.  Patient has chronic loose stool bowel movement, which is normal to patient.  Currently no nausea, vomiting or abdominal pain.  No symptoms of UTI.  He moves all extremities.  Per his daughter, patient's mental status is at baseline. ED Course: pt was found to have WBC 13.0, BNP 152.7, lactic acid 0.76, electrolytes renal function okay, temperature 101.1, no tachycardia, has tachypnea, oxygen saturation 91 to 92% on room air.  CXR showed cardiomegaly with mild diffuse pulmonary interstitial change, possible left-sided infiltration? Patient is admitted to telemetry bed as inpatient.  Hospital Course:  Principal problem Sepsis with healthcare associated pneumonia/HCAP -Patient met criteria for sepsis with leukocytosis, fever, tachypnea, lactic acidosis, chest x-ray with possible infiltrate. Patient was started  on broad spectrum antibiotics, clinically improved, his fever and leukocytosis resolved. His cultures have remained negative, his respiratory status is at baseline, will be converted to [facility] for 5 additional days. Influenza panel was negative.  Additional problems Chronic kidney disease stage III -Follow BMET, creatinine has remained stable, 0.95 on d/c GERD -Continue PPI History of CVA -Continue Plavix and statin History of hyperlipidemia -Continue statin Hypothyroidism -Continue Synthroid Chronic diastolic CHF -2D echo 0/73 showed EF of 55 to 60% with grade 1 diastolic dysfunction. He is euvolemic Essential hypertension -Continue home regimen. Added norvasc for persistently elevated reads Mechanical fall -CT head showed chronic small vessel ischemia and generalized atrophy otherwise no intracranial abnormality   Discharge Diagnoses:  Principal Problem:   HCAP (healthcare-associated pneumonia) Active Problems:   GERD (gastroesophageal reflux disease)   Chronic kidney disease, stage III (moderate) (HCC)   Dyslipidemia   CVA (cerebral vascular accident) ([place])   Hypothyroidism   Benign essential HTN   Hypokalemia   Fall   Chronic diastolic CHF (congestive heart failure) ([place])   Discharge Instructions  Allergies as of 11/25/2018      Reactions   Lisinopril Swelling   Swelling of lips      Medication List    TAKE these medications   acetaminophen 325 MG tablet Commonly known as:  TYLENOL Take 650 mg by mouth every 6 (six) hours as needed for mild pain.   amLODipine 5 MG tablet Commonly known as:  NORVASC Take 1 tablet (5 mg total) by mouth daily. Start taking on:  11/26/2018   cefdinir 300 MG capsule Commonly known as:  OMNICEF Take 1 capsule (300 mg total) by mouth 2 (two) times daily.   clopidogrel 75 MG tablet Commonly  known as:  PLAVIX Take 1 tablet (75 mg total) by mouth daily.   COMBIVENT RESPIMAT 20-100 MCG/ACT Aers respimat Generic drug:   Ipratropium-Albuterol Inhale 1 puff into the lungs every 6 (six) hours as needed for wheezing or shortness of breath.   cyanocobalamin 1000 MCG/ML injection Commonly known as:  (VITAMIN B-12) Inject 1,000 mcg into the muscle every 30 (thirty) days.   escitalopram 5 MG tablet Commonly known as:  LEXAPRO Take 1 tablet by mouth daily.   hydrALAZINE 25 MG tablet Commonly known as:  APRESOLINE Take 1 tablet (25 mg total) by mouth 3 (three) times daily.   levothyroxine 50 MCG tablet Commonly known as:  SYNTHROID, LEVOTHROID Take 1 tablet (50 mcg total) by mouth every morning. What changed:  when to take this   loratadine 10 MG tablet Commonly known as:  CLARITIN Take 10 mg by mouth daily as needed for allergies.   pantoprazole 40 MG tablet Commonly known as:  PROTONIX Take 1 tablet (40 mg total) by mouth daily. What changed:  when to take this   pravastatin 10 MG tablet Commonly known as:  PRAVACHOL Take 1 tablet (10 mg total) by mouth daily.   sertraline 100 MG tablet Commonly known as:  ZOLOFT Take 1 tablet by mouth daily.       Consultations:  None   Procedures/Studies:  Dg Chest 2 View  Result Date: 11/21/2018 CLINICAL DATA:  Initial evaluation for acute chest pain. EXAM: CHEST - 2 VIEW COMPARISON:  Prior radiograph from earlier the same day. FINDINGS: Moderate cardiomegaly.  Mediastinal silhouette within normal limits. Lungs are hypoinflated. Curvilinear left basilar opacity most consistent with atelectasis. Additional atelectatic changes at the right lung base. Diffuse pulmonary vascular and interstitial congestion, consistent with mild diffuse pulmonary interstitial edema. No appreciable pleural effusion. No other focal infiltrates. No pneumothorax. No acute osseous abnormality. IMPRESSION: 1. Cardiomegaly with mild diffuse pulmonary interstitial edema, suggesting CHF. 2. Low lung volumes with superimposed bibasilar atelectasis. Electronically Signed   By: Jeannine Boga M.D.   On: 11/21/2018 21:59   Ct Head Wo Contrast  Result Date: 11/22/2018 CLINICAL DATA:  Head trauma EXAM: CT HEAD WITHOUT CONTRAST TECHNIQUE: Contiguous axial images were obtained from the base of the skull through the vertex without intravenous contrast. COMPARISON:  11/02/2018 FINDINGS: Brain: No acute findings. There is generalized atrophy without lobar predilection. There is hypoattenuation of the periventricular white matter, most commonly indicating chronic ischemic microangiopathy. Vascular: No abnormal hyperdensity of the major intracranial arteries or dural venous sinuses. No intracranial atherosclerosis. Skull: The visualized skull base, calvarium and extracranial soft tissues are normal. Sinuses/Orbits: No fluid levels or advanced mucosal thickening of the visualized paranasal sinuses. No mastoid or middle ear effusion. The orbits are normal. IMPRESSION: A chronic small vessel ischemia and generalized atrophy without acute intracranial abnormality. Electronically Signed   By: Ulyses Jarred M.D.   On: 11/22/2018 03:23   Ct Head Wo Contrast  Result Date: 11/02/2018 CLINICAL DATA:  Fall in bathroom. Laceration and soft tissue swelling above the left orbit. Patient is amnestic to the event. Patient is on Plavix. EXAM: CT HEAD WITHOUT CONTRAST CT CERVICAL SPINE WITHOUT CONTRAST TECHNIQUE: Multidetector CT imaging of the head and cervical spine was performed following the standard protocol without intravenous contrast. Multiplanar CT image reconstructions of the cervical spine were also generated. COMPARISON:  None. FINDINGS: CT HEAD FINDINGS Brain: Advanced atrophy and diffuse white matter disease are again noted. No acute infarct, hemorrhage, or mass lesion is present.  The ventricles are stable and proportionate to the degree of atrophy. Remote lacunar infarcts of the basal ganglia are stable. Brainstem and cerebellum are unchanged. No significant extra-axial collection or hemorrhage is  present. Vascular: The cavernous mild atherosclerotic calcifications internal carotid are again seen within arteries. There is no hyperdense vessel. Skull: Calvarium is intact. Left supraorbital scalp soft tissue swelling and hematoma is present. There is no radiopaque foreign body. Laceration is noted. There is no underlying fracture. Sinuses/Orbits: The paranasal sinuses and mastoid air cells are clear. Bilateral lens replacements are present. Globes and orbits are otherwise within normal limits. CT CERVICAL SPINE FINDINGS Alignment: AP alignment is anatomic. There is no significant interval change. Skull base and vertebrae: Craniocervical junction is normal. Vertebral body heights are normal. No acute or healing fracture is present. Soft tissues and spinal canal: The paraspinous soft tissues are within normal limits. Atherosclerotic calcifications are present at the aortic arch. There is no significant calcification at the carotid bifurcations. Disc levels:  Multilevel facet degenerative changes are stable. Upper chest: Lung apices are clear. IMPRESSION: 1. Left supraorbital scalp laceration and hematoma without underlying fracture. 2. No globe injury is evident. 3. Stable advanced atrophy and white matter disease. 4. No acute intracranial abnormality. 5. Stable multilevel degenerative changes in the cervical spine without acute fracture or traumatic subluxation. Electronically Signed   By: [name] M.D.   On: 11/02/2018 08:40   Ct Cervical Spine Wo Contrast  Result Date: 11/02/2018 CLINICAL DATA:  Fall in bathroom. Laceration and soft tissue swelling above the left orbit. Patient is amnestic to the event. Patient is on Plavix. EXAM: CT HEAD WITHOUT CONTRAST CT CERVICAL SPINE WITHOUT CONTRAST TECHNIQUE: Multidetector CT imaging of the head and cervical spine was performed following the standard protocol without intravenous contrast. Multiplanar CT image reconstructions of the cervical spine were  also generated. COMPARISON:  None. FINDINGS: CT HEAD FINDINGS Brain: Advanced atrophy and diffuse white matter disease are again noted. No acute infarct, hemorrhage, or mass lesion is present. The ventricles are stable and proportionate to the degree of atrophy. Remote lacunar infarcts of the basal ganglia are stable. Brainstem and cerebellum are unchanged. No significant extra-axial collection or hemorrhage is present. Vascular: The cavernous mild atherosclerotic calcifications internal carotid are again seen within arteries. There is no hyperdense vessel. Skull: Calvarium is intact. Left supraorbital scalp soft tissue swelling and hematoma is present. There is no radiopaque foreign body. Laceration is noted. There is no underlying fracture. Sinuses/Orbits: The paranasal sinuses and mastoid air cells are clear. Bilateral lens replacements are present. Globes and orbits are otherwise within normal limits. CT CERVICAL SPINE FINDINGS Alignment: AP alignment is anatomic. There is no significant interval change. Skull base and vertebrae: Craniocervical junction is normal. Vertebral body heights are normal. No acute or healing fracture is present. Soft tissues and spinal canal: The paraspinous soft tissues are within normal limits. Atherosclerotic calcifications are present at the aortic arch. There is no significant calcification at the carotid bifurcations. Disc levels:  Multilevel facet degenerative changes are stable. Upper chest: Lung apices are clear. IMPRESSION: 1. Left supraorbital scalp laceration and hematoma without underlying fracture. 2. No globe injury is evident. 3. Stable advanced atrophy and white matter disease. 4. No acute intracranial abnormality. 5. Stable multilevel degenerative changes in the cervical spine without acute fracture or traumatic subluxation. Electronically Signed   By: [name] M.D.   On: 11/02/2018 08:40   Dg Humerus Left  Result Date: 11/22/2018 CLINICAL DATA:  83  y/o  M; fall with pain. EXAM: LEFT HUMERUS - 2+ VIEW COMPARISON:  10/14/2018 left upper extremity radiographs. FINDINGS: Slight angulation at the surgical neck of the humerus may represent a minimally impacted fracture. Diminished acromial humeral distance compatible with underlying rotator cuff injury is stable. No joint dislocation. Osteoarthrosis of the acromioclavicular joint periarticular osteophytosis. IMPRESSION: Slight angulation at the surgical neck of the humerus may represent a minimally impacted fracture. No joint dislocation. Electronically Signed   By: Kristine Garbe M.D.   On: 11/22/2018 21:54     Subjective: - no chest pain, shortness of breath, no abdominal pain, nausea or vomiting.   Discharge Exam: Vitals:   11/25/18 0836 11/25/18 0901  BP:  (!) 168/97  Pulse:  (!) 55  Resp:  20  Temp:  97.9 F (36.6 C)  SpO2: 94% 94%    General: Pt is alert, awake, not in acute distress Cardiovascular: RRR, S1/S2 +, no rubs, no gallops Respiratory: CTA bilaterally, no wheezing, no rhonchi Abdominal: Soft, NT, ND, bowel sounds + Extremities: no edema, no cyanosis    The results of significant diagnostics from this hospitalization (including imaging, microbiology, ancillary and laboratory) are listed below for reference.     Microbiology: Recent Results (from the past 240 hour(s))  Blood Culture (routine x 2)     Status: None (Preliminary result)   Collection Time: 11/21/18  9:02 PM  Result Value Ref Range Status   Specimen Description BLOOD LEFT ANTECUBITAL  Final   Special Requests   Final    BOTTLES DRAWN AEROBIC AND ANAEROBIC Blood Culture adequate volume   Culture   Final    NO GROWTH 4 DAYS Performed at [facility], [address] [place], [place] [address]    Report Status PENDING  Incomplete  Blood Culture (routine x 2)     Status: None (Preliminary result)   Collection Time: 11/21/18 11:20 PM  Result Value Ref Range Status   Specimen  Description BLOOD RIGHT ARM  Final   Special Requests   Final    BOTTLES DRAWN AEROBIC AND ANAEROBIC Blood Culture results may not be optimal due to an excessive volume of blood received in culture bottles   Culture   Final    NO GROWTH 4 DAYS Performed at [facility], [place] [address] [place], [place] [address]    Report Status PENDING  Incomplete  MRSA PCR Screening     Status: Abnormal   Collection Time: 11/22/18  6:52 PM  Result Value Ref Range Status   MRSA by PCR POSITIVE (A) NEGATIVE Final    Comment:        The GeneXpert MRSA Assay (FDA approved for NASAL specimens only), is one component of a comprehensive MRSA colonization surveillance program. It is not intended to diagnose MRSA infection nor to guide or monitor treatment for MRSA infections. RESULT CALLED TO, READ BACK BY AND VERIFIED WITHLind Guest RN 11/22/18 [id number] JDW Performed at [facility], [place] [address], [place], [place] [address]   Culture, sputum-assessment     Status: None   Collection Time: 11/22/18  7:00 PM  Result Value Ref Range Status   Specimen Description EXPECTORATED SPUTUM  Final   Special Requests NONE  Final   Sputum evaluation   Final    THIS SPECIMEN IS ACCEPTABLE FOR SPUTUM CULTURE Performed at [facility], [place] [address] [place], [place] [address]    Report Status 11/22/2018 FINAL  Final  Culture,  respiratory     Status: None   Collection Time: 11/22/18  7:00 PM  Result Value Ref Range Status   Specimen Description EXPECTORATED SPUTUM  Final   Special Requests NONE Reflexed from [id number]  Final   Gram Stain   Final    FEW WBC PRESENT, PREDOMINANTLY PMN RARE GRAM POSITIVE COCCI RARE GRAM POSITIVE RODS    Culture   Final    FEW Consistent with normal respiratory flora. Performed at [facility], [place] [address] [place], [place] [address]    Report Status 11/25/2018 FINAL  Final     Labs: BNP (last 3 results) Recent Labs    11/21/18 2103  BNP 152.7*     Basic Metabolic Panel: Recent Labs  Lab 11/21/18 2059 11/24/18 0422 11/25/18 0726  NA 137 138 136  K 3.3* 2.9* 3.4*  CL 103 107 109  CO2 22 22 21*  GLUCOSE 99 95 102*  BUN _0 CREATININE 1.17 1.11 0.95  CALCIUM 9.3 8.9 9.0   Liver Function Tests: No results for input(s): AST, ALT, ALKPHOS, BILITOT, PROT, ALBUMIN in the last 168 hours. No results for input(s): LIPASE, AMYLASE in the last 168 hours. No results for input(s): AMMONIA in the last 168 hours. CBC: Recent Labs  Lab 11/21/18 2059 11/24/18 0422  WBC 13.6* 7.7  HGB 12.4* 10.2*  HCT 38.5* 31.4*  MCV 88.5 87.0  PLT 326 293   Cardiac Enzymes: No results for input(s): CKTOTAL, CKMB, CKMBINDEX, TROPONINI in the last 168 hours. BNP: Invalid input(s): POCBNP CBG: No results for input(s): GLUCAP in the last 168 hours. D-Dimer No results for input(s): DDIMER in the last 72 hours. Hgb A1c No results for input(s): HGBA1C in the last 72 hours. Lipid Profile No results for input(s): CHOL, HDL, LDLCALC, TRIG, CHOLHDL, LDLDIRECT in the last 72 hours. Thyroid function studies No results for input(s): TSH, T4TOTAL, T3FREE, THYROIDAB in the last 72 hours.  Invalid input(s): FREET3 Anemia work up No results for input(s): VITAMINB12, FOLATE, FERRITIN, TIBC, IRON, RETICCTPCT in the last 72 hours. Urinalysis    Component Value Date/Time   COLORURINE STRAW (A) 11/02/2018 0849   APPEARANCEUR CLEAR 11/02/2018 0849   LABSPEC 1.013 11/02/2018 0849   PHURINE 5.0 11/02/2018 0849   GLUCOSEU NEGATIVE 11/02/2018 0849   HGBUR SMALL (A) 11/02/2018 0849   BILIRUBINUR NEGATIVE 11/02/2018 0849   BILIRUBINUR NEG 01/21/2018 2022   KETONESUR NEGATIVE 11/02/2018 0849   PROTEINUR 100 (A) 11/02/2018 0849   UROBILINOGEN 1.0 01/21/2018 2022   NITRITE NEGATIVE 11/02/2018 0849   LEUKOCYTESUR NEGATIVE 11/02/2018 0849   Sepsis Labs Invalid input(s): PROCALCITONIN,  WBC,  LACTICIDVEN   Time coordinating discharge: 35  minutes  SIGNED:  Marzetta Board, MD  [facility] 11/25/2018, 11:17 AM Pager [phone]  If 7PM-7AM, please contact night-coverage [email or web address] Password [email or web address]

## 2018-11-25 NOTE — Clinical Social Work Note (Signed)
Clinical Social Worker facilitated patient discharge including contacting patient family and facility to confirm patient discharge plans.  Clinical information faxed to facility and family agreeable with plan.  Pt's daughter will transport to [facility] at [place].  RN to call [phone] for report prior to discharge.  Clinical Social Worker will sign off for now as social work intervention is no longer needed. Please consult [place] again if new need arises.  [place], [place]

## 2018-11-25 NOTE — Clinical Social Work Note (Signed)
Pt has been accepted at [facility] at [place] daughter aware and agreeable. MD aware. Pt's daughter will transport to facility. CSW will fax summary to facility.  [place], [place]

## 2018-11-25 NOTE — Progress Notes (Signed)
Pharmacy Antibiotic Note  Anthony Hayes is a 83 y.o. male admitted on 11/21/2018 with pneumonia.  Pharmacy has been consulted for vancomycin dosing. The patient is afebrile on prn APAP, WBC WNL, LA 2, PCT <0.1.   Plan: Vancomycin 733m IV every 12 hours.  Goal trough 15-20 mcg/mL.  Cefepime 1g q12h (per MD) Azithromycin 5010mq24h (per MD) Monitor clinical picture, renal function, vancomycin levels F/U C&S, abx de-escalation, LOT   Height: 5' 4"  (162.6 cm) Weight: 169 lb 8.5 oz (76.9 kg) IBW/kg (Calculated) : 59.2  Temp (24hrs), Avg:98 F (36.7 C), Min:97.5 F (36.4 C), Max:98.4 F (36.9 C)  Recent Labs  Lab 11/21/18 2059 11/21/18 2325 11/22/18 0243 11/24/18 0422  WBC 13.6*  --   --  7.7  CREATININE 1.17  --   --  1.11  LATICACIDVEN  --  0.76 2.0*  --     Estimated Creatinine Clearance: 48.9 mL/min (by C-G formula based on SCr of 1.11 mg/dL).    Allergies  Allergen Reactions  . Lisinopril Swelling    Swelling of lips     Antimicrobials this admission: Rocephin x 1 in ED 11/25 Vanc 11/26 >> Cefepime 11/26 >> Azithro 11/26 >>  Dose adjustments this admission: CrCl improved: vanc 1g q24h>750 q12h  Microbiology results: 11/25 influenza PCR neg 11/25 BCx >>ngtd 11/26 sputum >>normal flora 11/26 MRSA PCR positive 11/27 Legionella urinary Ag: neg 11/27 Strep pneumo urinary Ag: neg   Thank you for allowing pharmacy to be a part of this patient's care.   AmBrendolyn PattyPharmD PGY1 Pharmacy Resident Phone [phone]/29/2019   11:25 AM

## 2018-11-26 LAB — CULTURE, BLOOD (ROUTINE X 2)
CULTURE: NO GROWTH
Culture: NO GROWTH
Special Requests: ADEQUATE

## 2018-11-29 ENCOUNTER — Other Ambulatory Visit: Payer: Self-pay

## 2018-11-29 MED ORDER — CLOPIDOGREL BISULFATE 75 MG PO TABS: 75.0000 mg | ORAL_TABLET | Freq: Every day | ORAL | 10 refills | Status: DC

## 2019-01-19 NOTE — Progress Notes (Signed)
Visit rescheduled for Monday Jan 27th, 2020. Patient was not seen.  Violeta Gelinas NP-C

## 2019-01-20 ENCOUNTER — Non-Acute Institutional Stay: Payer: Medicare Other | Admitting: Internal Medicine

## 2019-01-20 DIAGNOSIS — Z515 Encounter for palliative care: Secondary | ICD-10-CM

## 2019-01-23 ENCOUNTER — Encounter: Payer: Self-pay | Admitting: Internal Medicine

## 2019-01-23 ENCOUNTER — Non-Acute Institutional Stay: Payer: Medicare Other | Admitting: Internal Medicine

## 2019-01-23 VITALS — BP 102/64 | HR 44 | Resp 12 | Ht 66.0 in | Wt 174.0 lb

## 2019-01-23 DIAGNOSIS — Z515 Encounter for palliative care: Secondary | ICD-10-CM

## 2019-01-23 NOTE — Progress Notes (Signed)
Jan 27th 2020 [facility] Telephone: [phone] Fax: [phone]  PATIENT NAME: Anthony Hayes DOB: 1935-05-22 MRN: [id number] [facility] (08/16/17) PRIMARY CARE PROVIDER:   Etter Sjogren, [place] [address] [place], [place] [address] Dr. Jewel Baize [phone] Dr. Minus Breeding (Cardiology)  REFERRING PROVIDER:Thu Alfonse Spruce NP  RESPONSIBLE PARTY:  *(dtr) [name] [phone], (M) [phone]. Shelbie Hutching [phone], [phone] ([address]). (S-I-L) Dione Booze [phone]  Impressions:  1. Slow cognitive decline. FAST 6e (using Alzheimer's dementia score). He is oriented to person, place, and self. He couldn't remember his birth date. Able to tell me his daughter's names. Recognizes staff by their faces. Needs assist with hygiene, dressing, and tolieting. He is incontinent of stool (loose) and often of urine. He is a 1 person steadying assist to transfer. He has had 3 falls so far this month, thought secondary to imbalance and deconditioning. PT/OT working with him for strength and balance. Staff encourage him to remain in his wheelchair and transfer with assist only. Oral intake 50% of meals. Current weight is 164.2lbs, which is a weight loss of 13.8 lbs (7.7% of body weight) over the last 2 months. At a height of 5'6" his BMI is 26.5 kg/m2.   2. Goals of care: Full Code. Will continue to address and update MOST form with patient and family over next series of visits.  I spent 60 minutes providing this consultation,  from 2:30pm to 3:30pm. More than 50% of the time in this consultation was spent coordinating communication, interviewing of staff and family, and reconciling SN MAR with EPIC med list.     HISTORY OF PRESENT ILLNESS:  Anthony Hayes is a 84 y.o. male with medical h/o hyperlipidemia, stroke (April 2019, Plavix), GERD, hypothyroidism, depression, dementia, arthritis, BPH, Bradycardia (Dr. Al[facility]) and  CKD 3 (creatinine 0.95). He had a hospital admission 11/25-11/29/19 for sepsis secondary to pneumonia. He was discharged to [facility] for rehab. Readmitted to [facility]  12/16/2018. Palliative Care was asked to help address goals of care.   CODE STATUS: Full Code  PPS: weak 40% HOSPICE ELIGIBILITY/DIAGNOSIS: no, as prognosis thought to be greater than 6 months.  PAST MEDICAL HISTORY:  Past Medical History:  Diagnosis Date  . Arthritis    "joints" (10/15/2017)  . Benign prostatic hyperplasia   . Complication of anesthesia    "last OR in 06/2017 affected him cognitively; hasn't got back to baseline yet" (10/15/2017)  . Dementia ([place])   . Depression   . GERD (gastroesophageal reflux disease)   . High cholesterol   . Hypertension   . Hypothyroidism   . Renal disease   . Renal disorder   . TIA (transient ischemic attack) early 2000s    SOCIAL HX:  Social History   Tobacco Use  . Smoking status: Former Smoker    Years: 7.00    Types: Cigarettes    Last attempt to quit: 1963    Years since quitting: 57.1  . Smokeless tobacco: Never Used  Substance Use Topics  . Alcohol use: Yes    Comment: 10/15/2017 "a few drinks/year"    ALLERGIES:  Allergies  Allergen Reactions  . Lisinopril Swelling    Swelling of lips      PERTINENT MEDICATIONS:  Outpatient Encounter Medications as of 01/23/2019  Medication Sig  . acetaminophen (TYLENOL) 325 MG tablet Take 650 mg by mouth every 6 (six) hours  as needed for mild pain.  Marland Kitchen amLODipine (NORVASC) 5 MG tablet Take 1 tablet (5 mg total) by mouth daily.  . clopidogrel (PLAVIX) 75 MG tablet Take 1 tablet (75 mg total) by mouth daily.  . COMBIVENT RESPIMAT 20-100 MCG/ACT AERS respimat Inhale 1 puff into the lungs every 6 (six) hours as needed for wheezing or shortness of breath.   . cyanocobalamin (,VITAMIN B-12,) 1000 MCG/ML injection Inject 1,000 mcg into the muscle every 30 (thirty) days.  Marland Kitchen escitalopram  (LEXAPRO) 5 MG tablet Take 1 tablet by mouth daily.  . hydrALAZINE (APRESOLINE) 25 MG tablet Take 1 tablet (25 mg total) by mouth 3 (three) times daily.  Marland Kitchen ibuprofen (ADVIL,MOTRIN) 400 MG tablet Take 400 mg by mouth every 8 (eight) hours as needed.  Marland Kitchen levothyroxine (SYNTHROID, LEVOTHROID) 50 MCG tablet Take 1 tablet (50 mcg total) by mouth every morning. (Patient taking differently: Take 50 mcg by mouth daily before breakfast. )  . loratadine (CLARITIN) 10 MG tablet Take 10 mg by mouth daily as needed for allergies.   . pantoprazole (PROTONIX) 40 MG tablet Take 1 tablet (40 mg total) by mouth daily.  . pravastatin (PRAVACHOL) 10 MG tablet Take 1 tablet (10 mg total) by mouth daily.  . sertraline (ZOLOFT) 100 MG tablet Take 1 tablet by mouth daily.  . [DISCONTINUED] cefdinir (OMNICEF) 300 MG capsule Take 1 capsule (300 mg total) by mouth 2 (two) times daily.   No facility-administered encounter medications on file as of 01/23/2019.     PHYSICAL EXAM:  BP 120/64, HR 44,RR12,  Well nourished elderly male napping in wheelchair. He awoke easily to my voice. Pleasantly conversant.  Cardiovascular: bradycardia, soft harsh systolic murmur Pulmonary: clear ant fields, left basilar soft insp crackles Abdomen: soft, nontender, + bowel sounds GU: no suprapubic tenderness Extremities: no edema, no joint deformities Skin: no rashes Neurological: Weakness but otherwise nonfocal  Julianne Handler, NP

## 2019-02-23 ENCOUNTER — Non-Acute Institutional Stay: Payer: Medicare Other | Admitting: Internal Medicine

## 2019-02-23 ENCOUNTER — Encounter: Payer: Self-pay | Admitting: Internal Medicine

## 2019-02-23 DIAGNOSIS — Z515 Encounter for palliative care: Secondary | ICD-10-CM

## 2019-02-23 NOTE — Addendum Note (Signed)
Addended by: Violeta Gelinas on: 02/23/2019 07:43 PM   Modules accepted: Orders

## 2019-02-23 NOTE — Progress Notes (Addendum)
Feb 27th, [place] Telephone: [phone] Fax: [phone]  PATIENT NAME: Anthony Hayes DOB: 12/26/1935 MRN: [id number] [facility] [place] (08/16/17)  PRIMARY CARE PROVIDER:   Farrel Conners NP ([facility])   Etter Sjogren, [facility]. MAIN [place], [place] [address] Dr. Jewel Baize [phone] Dr. Minus Breeding (Cardiology)  REFERRING PROVIDER:Thu Alfonse Spruce NP  RESPONSIBLE PARTY:  *(dtr) [name] [phone], (M) [phone]. Shelbie Hutching [phone], [phone] ([address] (S-I-L) Dione Booze [phone]  Impressions:  1. Continued slow functional and cognitive decline. FAST 6e (using Alzheimer's dementia score). Patient is oriented to person, place, and self. Needs assist with hygiene, dressing, and tolieting. He now needs a 1-2 person assist to stand. He is incontinent of stool (loose) and increasingly of urine.Nursing staff report increased weakness, and sleeping more during the day. He has had increased falls, averaging about 2-3/week, thought secondary to poor safety awareness. He forgets that he cannot stand and ambulate without assist. To improve safety patient's bed is being changed to a hospital bed. There are plans to move him to memory care, in a shared room with a roommate whom patient knows and likes, and who is also transitioning to memory care. that several falls since without injury. Patient's daughter reports that patient's Mini Mental Status Exam has decreased from 13/30 down to 10/30. Oral intake 50% of meals. Current weight Is 163.8lbs, which is stable over the last month. He had previously lost 13.8 lbs (7.7% of body weight) over November to January. At a height of 5'6" his BMI is 26.5 kg/m2. Psych NP Rocky Crafts is following. Patient continues on Zoloft for depression. Lexapro had been discontinued without any excerbation of depression.   2. Goals of  care / Advance Care Directives: I met with daughter and Hermenia Fiscal Phlpott. She has been discussing and reviewing previously supplied written material regarding DNR and the MOST, with her sister. We completed those forms today.  I spent 60 minutes providing this consultation,  from 5:30pm to 6:30pm. More than 50% of the time in this consultation was spent coordinating communication, interviewing of staff and family, and reconciling SN MAR with EPIC med list.     HISTORY OF PRESENT ILLNESS:  Anthony Hayes is a 84 y.o. male with medical h/o hyperlipidemia, stroke (April 2019, Plavix), GERD, hypothyroidism, depression, dementia, arthritis, BPH, Bradycardia (Dr. Alba Cory Cardiology) and CKD 3 (creatinine 0.95). He had a hospital admission 11/25-11/29/19 for sepsis secondary to pneumonia. He was discharged to [facility] for rehab. Readmitted to [facility]  12/16/2018. This is a followup Palliative Care visit (from 01/23/2019) to meet with patient's daughter to discuss [place].    CODE STATUS: DNR. MOST form details: DNR/DNI, Limited scope of medical interventions, Yes to Antibiotics and IVFs, feeding tube for a defined trial period.   PPS:  30% HOSPICE ELIGIBILITY/DIAGNOSIS: no, as prognosis thought to be greater than 6 months.  PAST MEDICAL HISTORY:  Past Medical History:  Diagnosis Date  . Arthritis    "joints" (10/15/2017)  . Benign prostatic hyperplasia   . Complication of anesthesia    "last OR in 06/2017 affected him cognitively; hasn't got back to baseline yet" (10/15/2017)  . Dementia ([place])   . Depression   . GERD (gastroesophageal reflux disease)   . High cholesterol   . Hypertension   . Hypothyroidism   . Renal disease   .  Renal disorder   . TIA (transient ischemic attack) early 2000s    SOCIAL HX:  Social History   Tobacco Use  . Smoking status: Former Smoker    Years: 7.00    Types: Cigarettes    Last attempt to quit: 1963      Years since quitting: 57.1  . Smokeless tobacco: Never Used  Substance Use Topics  . Alcohol use: Yes    Comment: 10/15/2017 "a few drinks/year"    ALLERGIES:  Allergies  Allergen Reactions  . Lisinopril Swelling    Swelling of lips      PERTINENT MEDICATIONS:  Outpatient Encounter Medications as of 02/23/2019  Medication Sig  . hydrALAZINE (APRESOLINE) 25 MG tablet Take 25 mg by mouth 3 (three) times daily.  Marland Kitchen acetaminophen (TYLENOL) 325 MG tablet Take 650 mg by mouth every 6 (six) hours as needed for mild pain.  Marland Kitchen amLODipine (NORVASC) 5 MG tablet Take 1 tablet (5 mg total) by mouth daily.  . clopidogrel (PLAVIX) 75 MG tablet Take 1 tablet (75 mg total) by mouth daily.  . COMBIVENT RESPIMAT 20-100 MCG/ACT AERS respimat Inhale 1 puff into the lungs every 6 (six) hours as needed for wheezing or shortness of breath.   . cyanocobalamin (,VITAMIN B-12,) 1000 MCG/ML injection Inject 1,000 mcg into the muscle every 30 (thirty) days.  Marland Kitchen ibuprofen (ADVIL,MOTRIN) 400 MG tablet Take 400 mg by mouth every 8 (eight) hours as needed.  Marland Kitchen levothyroxine (SYNTHROID, LEVOTHROID) 50 MCG tablet Take 1 tablet (50 mcg total) by mouth every morning. (Patient taking differently: Take 50 mcg by mouth daily before breakfast. )  . loratadine (CLARITIN) 10 MG tablet Take 10 mg by mouth daily as needed for allergies.   . pantoprazole (PROTONIX) 40 MG tablet Take 1 tablet (40 mg total) by mouth daily.  . pravastatin (PRAVACHOL) 10 MG tablet Take 1 tablet (10 mg total) by mouth daily.  . sertraline (ZOLOFT) 100 MG tablet Take 1 tablet by mouth daily.  . [DISCONTINUED] escitalopram (LEXAPRO) 5 MG tablet Take 1 tablet by mouth daily.  . [DISCONTINUED] hydrALAZINE (APRESOLINE) 25 MG tablet Take 1 tablet (25 mg total) by mouth 3 (three) times daily.   No facility-administered encounter medications on file as of 02/23/2019.     PHYSICAL EXAM:    Well nourished elderly male who is alert and pleasantly conversant.  He has a happy affect. He is participating in a facility activity. He is enjoying his daughter Anita's visit.  Extremities: softly pitting LE edema to mid calf, non weeping. Skin: no rashes Neurological: Weakness but otherwise nonfocal  Julianne Handler, NP

## 2019-03-21 ENCOUNTER — Ambulatory Visit: Payer: Medicare Other | Admitting: Adult Health

## 2019-09-30 ENCOUNTER — Other Ambulatory Visit
Admission: RE | Admit: 2019-09-30 | Discharge: 2019-09-30 | Disposition: A | Discharge status: Home / Self Care | Payer: Medicare Other | Source: Skilled Nursing Facility | Attending: *Deleted | Admitting: *Deleted

## 2019-10-01 LAB — URINALYSIS, COMPLETE (UACMP) WITH MICROSCOPIC
Bilirubin Urine: NEGATIVE
Glucose, UA: NEGATIVE mg/dL
Ketones, ur: NEGATIVE mg/dL
Nitrite: POSITIVE — AB
Protein, ur: 100 mg/dL — AB
RBC / HPF: >50 RBC/hpf — ABNORMAL HIGH (ref 0–5)
Specific Gravity, Urine: 1.019 (ref 1.005–1.030)
WBC, UA: >50 WBC/hpf — ABNORMAL HIGH (ref 0–5)
pH: 5 (ref 5.0–8.0)

## 2019-10-03 LAB — URINE CULTURE: Culture: >=100000 — AB

## 2019-12-19 ENCOUNTER — Encounter (HOSPITAL_COMMUNITY): Payer: Self-pay | Admitting: Family Medicine

## 2019-12-19 ENCOUNTER — Inpatient Hospital Stay (HOSPITAL_COMMUNITY)
Admission: EM | Admit: 2019-12-19 | Discharge: 2019-12-25 | DRG: 177 | Disposition: A | Discharge status: Nursing Home (Includes ICF) | Payer: Medicare Other | Attending: Internal Medicine | Admitting: Internal Medicine

## 2019-12-19 ENCOUNTER — Emergency Department (HOSPITAL_COMMUNITY): Payer: Medicare Other

## 2019-12-19 ENCOUNTER — Other Ambulatory Visit: Payer: Self-pay

## 2019-12-19 DIAGNOSIS — F0390 Unspecified dementia without behavioral disturbance: Secondary | ICD-10-CM | POA: Diagnosis present

## 2019-12-19 DIAGNOSIS — I10 Essential (primary) hypertension: Secondary | ICD-10-CM | POA: Diagnosis present

## 2019-12-19 DIAGNOSIS — F419 Anxiety disorder, unspecified: Secondary | ICD-10-CM | POA: Diagnosis present

## 2019-12-19 DIAGNOSIS — Z8249 Family history of ischemic heart disease and other diseases of the circulatory system: Secondary | ICD-10-CM

## 2019-12-19 DIAGNOSIS — Z7409 Other reduced mobility: Secondary | ICD-10-CM | POA: Diagnosis present

## 2019-12-19 DIAGNOSIS — Z9049 Acquired absence of other specified parts of digestive tract: Secondary | ICD-10-CM

## 2019-12-19 DIAGNOSIS — R778 Other specified abnormalities of plasma proteins: Secondary | ICD-10-CM

## 2019-12-19 DIAGNOSIS — J9601 Acute respiratory failure with hypoxia: Secondary | ICD-10-CM | POA: Diagnosis present

## 2019-12-19 DIAGNOSIS — U071 COVID-19: Secondary | ICD-10-CM | POA: Diagnosis present

## 2019-12-19 DIAGNOSIS — J690 Pneumonitis due to inhalation of food and vomit: Secondary | ICD-10-CM | POA: Diagnosis present

## 2019-12-19 DIAGNOSIS — E538 Deficiency of other specified B group vitamins: Secondary | ICD-10-CM | POA: Diagnosis present

## 2019-12-19 DIAGNOSIS — Z6825 Body mass index (BMI) 25.0-25.9, adult: Secondary | ICD-10-CM

## 2019-12-19 DIAGNOSIS — Z66 Do not resuscitate: Secondary | ICD-10-CM | POA: Diagnosis present

## 2019-12-19 DIAGNOSIS — Z87891 Personal history of nicotine dependence: Secondary | ICD-10-CM

## 2019-12-19 DIAGNOSIS — E785 Hyperlipidemia, unspecified: Secondary | ICD-10-CM | POA: Diagnosis present

## 2019-12-19 DIAGNOSIS — J1289 Other viral pneumonia: Secondary | ICD-10-CM | POA: Diagnosis present

## 2019-12-19 DIAGNOSIS — Z79899 Other long term (current) drug therapy: Secondary | ICD-10-CM

## 2019-12-19 DIAGNOSIS — E872 Acidosis: Secondary | ICD-10-CM | POA: Diagnosis present

## 2019-12-19 DIAGNOSIS — Z87448 Personal history of other diseases of urinary system: Secondary | ICD-10-CM

## 2019-12-19 DIAGNOSIS — R001 Bradycardia, unspecified: Secondary | ICD-10-CM | POA: Diagnosis present

## 2019-12-19 DIAGNOSIS — E039 Hypothyroidism, unspecified: Secondary | ICD-10-CM | POA: Diagnosis present

## 2019-12-19 DIAGNOSIS — E876 Hypokalemia: Secondary | ICD-10-CM | POA: Diagnosis present

## 2019-12-19 DIAGNOSIS — R739 Hyperglycemia, unspecified: Secondary | ICD-10-CM | POA: Diagnosis present

## 2019-12-19 DIAGNOSIS — E43 Unspecified severe protein-calorie malnutrition: Secondary | ICD-10-CM | POA: Diagnosis present

## 2019-12-19 DIAGNOSIS — K219 Gastro-esophageal reflux disease without esophagitis: Secondary | ICD-10-CM | POA: Diagnosis present

## 2019-12-19 DIAGNOSIS — N183 Chronic kidney disease, stage 3 unspecified: Secondary | ICD-10-CM | POA: Diagnosis present

## 2019-12-19 DIAGNOSIS — K529 Noninfective gastroenteritis and colitis, unspecified: Secondary | ICD-10-CM | POA: Diagnosis present

## 2019-12-19 DIAGNOSIS — Z981 Arthrodesis status: Secondary | ICD-10-CM

## 2019-12-19 DIAGNOSIS — E7800 Pure hypercholesterolemia, unspecified: Secondary | ICD-10-CM | POA: Diagnosis present

## 2019-12-19 DIAGNOSIS — L89321 Pressure ulcer of left buttock, stage 1: Secondary | ICD-10-CM | POA: Diagnosis present

## 2019-12-19 DIAGNOSIS — Z8673 Personal history of transient ischemic attack (TIA), and cerebral infarction without residual deficits: Secondary | ICD-10-CM

## 2019-12-19 DIAGNOSIS — R0902 Hypoxemia: Secondary | ICD-10-CM

## 2019-12-19 DIAGNOSIS — N390 Urinary tract infection, site not specified: Secondary | ICD-10-CM | POA: Diagnosis present

## 2019-12-19 DIAGNOSIS — F329 Major depressive disorder, single episode, unspecified: Secondary | ICD-10-CM | POA: Diagnosis present

## 2019-12-19 DIAGNOSIS — Z7902 Long term (current) use of antithrombotics/antiplatelets: Secondary | ICD-10-CM

## 2019-12-19 DIAGNOSIS — R0602 Shortness of breath: Secondary | ICD-10-CM

## 2019-12-19 DIAGNOSIS — Z888 Allergy status to other drugs, medicaments and biological substances status: Secondary | ICD-10-CM

## 2019-12-19 DIAGNOSIS — Z8679 Personal history of other diseases of the circulatory system: Secondary | ICD-10-CM

## 2019-12-19 LAB — COMPREHENSIVE METABOLIC PANEL
ALT: 14 U/L (ref 0–44)
AST: 17 U/L (ref 15–41)
Albumin: 3 g/dL — ABNORMAL LOW (ref 3.5–5.0)
Alkaline Phosphatase: 79 U/L (ref 38–126)
Anion gap: 10 (ref 5–15)
BUN: 27 mg/dL — ABNORMAL HIGH (ref 8–23)
CO2: 20 mmol/L — ABNORMAL LOW (ref 22–32)
Calcium: 8.4 mg/dL — ABNORMAL LOW (ref 8.9–10.3)
Chloride: 110 mmol/L (ref 98–111)
Creatinine, Ser: 1.15 mg/dL (ref 0.61–1.24)
GFR calc Af Amer: >60 mL/min (ref >60)
GFR calc non Af Amer: 59 mL/min — ABNORMAL LOW (ref >60)
Glucose, Bld: 135 mg/dL — ABNORMAL HIGH (ref 70–99)
Potassium: 2.6 mmol/L — CL (ref 3.5–5.1)
Sodium: 140 mmol/L (ref 135–145)
Total Bilirubin: 1.2 mg/dL (ref 0.3–1.2)
Total Protein: 6.3 g/dL — ABNORMAL LOW (ref 6.5–8.1)

## 2019-12-19 LAB — URINALYSIS, ROUTINE W REFLEX MICROSCOPIC
Bilirubin Urine: NEGATIVE
Glucose, UA: NEGATIVE mg/dL
Hgb urine dipstick: NEGATIVE
Ketones, ur: NEGATIVE mg/dL
Nitrite: NEGATIVE
Protein, ur: 100 mg/dL — AB
Specific Gravity, Urine: 1.026 (ref 1.005–1.030)
WBC, UA: >50 WBC/hpf — ABNORMAL HIGH (ref 0–5)
pH: 5 (ref 5.0–8.0)

## 2019-12-19 LAB — CBC WITH DIFFERENTIAL/PLATELET
Abs Immature Granulocytes: 0.07 10*3/uL (ref 0.00–0.07)
Basophils Absolute: 0 10*3/uL (ref 0.0–0.1)
Basophils Relative: 0 %
Eosinophils Absolute: 0 10*3/uL (ref 0.0–0.5)
Eosinophils Relative: 0 %
HCT: 33 % — ABNORMAL LOW (ref 39.0–52.0)
Hemoglobin: 11.2 g/dL — ABNORMAL LOW (ref 13.0–17.0)
Immature Granulocytes: 1 %
Lymphocytes Relative: 13 %
Lymphs Abs: 1.5 10*3/uL (ref 0.7–4.0)
MCH: 30.7 pg (ref 26.0–34.0)
MCHC: 33.9 g/dL (ref 30.0–36.0)
MCV: 90.4 fL (ref 80.0–100.0)
Monocytes Absolute: 1.3 10*3/uL — ABNORMAL HIGH (ref 0.1–1.0)
Monocytes Relative: 11 %
Neutro Abs: 8.7 10*3/uL — ABNORMAL HIGH (ref 1.7–7.7)
Neutrophils Relative %: 75 %
Platelets: 290 10*3/uL (ref 150–400)
RBC: 3.65 MIL/uL — ABNORMAL LOW (ref 4.22–5.81)
RDW: 13 % (ref 11.5–15.5)
WBC: 11.5 10*3/uL — ABNORMAL HIGH (ref 4.0–10.5)
nRBC: 0 % (ref 0.0–0.2)

## 2019-12-19 LAB — TROPONIN I (HIGH SENSITIVITY)
Troponin I (High Sensitivity): 33 ng/L — ABNORMAL HIGH (ref <18)
Troponin I (High Sensitivity): 32 ng/L — ABNORMAL HIGH (ref <18)

## 2019-12-19 LAB — POC SARS CORONAVIRUS 2 AG -  ED: SARS Coronavirus 2 Ag: NEGATIVE

## 2019-12-19 LAB — TRIGLYCERIDES: Triglycerides: 107 mg/dL (ref <150)

## 2019-12-19 LAB — LACTATE DEHYDROGENASE: LDH: 165 U/L (ref 98–192)

## 2019-12-19 LAB — FIBRINOGEN: Fibrinogen: 739 mg/dL — ABNORMAL HIGH (ref 210–475)

## 2019-12-19 LAB — D-DIMER, QUANTITATIVE: D-Dimer, Quant: 1.49 ug/mL-FEU — ABNORMAL HIGH (ref 0.00–0.50)

## 2019-12-19 LAB — FERRITIN: Ferritin: 307 ng/mL (ref 24–336)

## 2019-12-19 LAB — C-REACTIVE PROTEIN: CRP: 14.9 mg/dL — ABNORMAL HIGH (ref <1.0)

## 2019-12-19 LAB — PROCALCITONIN: Procalcitonin: 0.24 ng/mL

## 2019-12-19 LAB — ABO/RH: ABO/RH(D): O POS

## 2019-12-19 LAB — LACTIC ACID, PLASMA: Lactic Acid, Venous: 1.1 mmol/L (ref 0.5–1.9)

## 2019-12-19 MED ORDER — POTASSIUM CHLORIDE 10 MEQ/100ML IV SOLN
10.0000 meq | INTRAVENOUS | Status: AC
  Administered 2019-12-19 – 2019-12-20 (×4): 10 meq via INTRAVENOUS
  Filled 2019-12-19 (×4): qty 100

## 2019-12-19 MED ORDER — IOHEXOL 350 MG/ML SOLN
100.0000 mL | Freq: Once | INTRAVENOUS | Status: AC | PRN
  Administered 2019-12-19: 22:00:00 100 mL via INTRAVENOUS

## 2019-12-19 MED ORDER — SODIUM CHLORIDE 0.9 % IV SOLN
3.0000 g | Freq: Once | INTRAVENOUS | Status: AC
  Administered 2019-12-19: 3 g via INTRAVENOUS
  Filled 2019-12-19: qty 8

## 2019-12-19 MED ORDER — SODIUM CHLORIDE 0.9 % IV SOLN: 2.0000 g | Freq: Once | INTRAVENOUS | Status: DC

## 2019-12-19 MED ORDER — VANCOMYCIN HCL 1500 MG/300ML IV SOLN
1500.0000 mg | INTRAVENOUS | Status: DC
  Filled 2019-12-19: qty 300

## 2019-12-19 MED ORDER — SODIUM CHLORIDE 0.9 % IV SOLN
200.0000 mg | Freq: Once | INTRAVENOUS | Status: AC
  Administered 2019-12-19: 23:00:00 200 mg via INTRAVENOUS
  Filled 2019-12-19: qty 200

## 2019-12-19 MED ORDER — DEXAMETHASONE SODIUM PHOSPHATE 10 MG/ML IJ SOLN
10.0000 mg | Freq: Once | INTRAMUSCULAR | Status: AC
  Administered 2019-12-19: 19:00:00 10 mg via INTRAVENOUS
  Filled 2019-12-19: qty 1

## 2019-12-19 MED ORDER — ACETAMINOPHEN 325 MG PO TABS: 650.0000 mg | ORAL_TABLET | Freq: Four times a day (QID) | ORAL | Status: DC | PRN

## 2019-12-19 MED ORDER — ENOXAPARIN SODIUM 40 MG/0.4ML ~~LOC~~ SOLN
40.0000 mg | Freq: Every day | SUBCUTANEOUS | Status: DC
  Administered 2019-12-19 – 2019-12-24 (×6): 40 mg via SUBCUTANEOUS
  Filled 2019-12-19 (×6): qty 0.4

## 2019-12-19 MED ORDER — SODIUM CHLORIDE 0.9 % IV SOLN
100.0000 mg | Freq: Every day | INTRAVENOUS | Status: AC
  Administered 2019-12-20 – 2019-12-23 (×4): 100 mg via INTRAVENOUS
  Filled 2019-12-19 (×4): qty 100

## 2019-12-19 MED ORDER — ALBUTEROL SULFATE HFA 108 (90 BASE) MCG/ACT IN AERS
6.0000 | INHALATION_SPRAY | Freq: Once | RESPIRATORY_TRACT | Status: AC
  Administered 2019-12-19: 19:00:00 6 via RESPIRATORY_TRACT
  Filled 2019-12-19: qty 6.7

## 2019-12-19 MED ORDER — SODIUM CHLORIDE 0.9 % IV SOLN
1000.0000 mL | INTRAVENOUS | Status: DC
  Administered 2019-12-19 – 2019-12-20 (×2): 1000 mL via INTRAVENOUS

## 2019-12-19 MED ORDER — DEXAMETHASONE SODIUM PHOSPHATE 10 MG/ML IJ SOLN
6.0000 mg | INTRAMUSCULAR | Status: DC
  Administered 2019-12-20 – 2019-12-24 (×5): 6 mg via INTRAVENOUS
  Filled 2019-12-19 (×5): qty 1

## 2019-12-19 MED ORDER — PRO-STAT SUGAR FREE PO LIQD
30.0000 mL | Freq: Two times a day (BID) | ORAL | Status: DC
  Administered 2019-12-19 – 2019-12-25 (×12): 30 mL via ORAL
  Filled 2019-12-19 (×12): qty 30

## 2019-12-19 MED ORDER — SODIUM CHLORIDE 0.9 % IV SOLN: 1.0000 g | Freq: Once | INTRAVENOUS | Status: DC

## 2019-12-19 NOTE — ED Notes (Signed)
Patient in CT

## 2019-12-19 NOTE — ED Provider Notes (Signed)
10:30 PM Signout from [facility] PA-C at shift change. Pending completion of work-up.  Suspected COVID-19.  Negative rapid antigen test here, PCR pending.  CT images and report reviewed personally.  No large PE.  Bilateral patchy airspace opacities consistent with history of Covid pneumonia.  Patient will need admission given oxygen requirement.  I discussed case with Dr. Maudie Mercury of [facility] hospitalist who will see patient.  I have ordered potassium repletion as potassium was 2.6.   EKG Interpretation  Date/Time:  Tuesday December 19 2019 18:41:45 EST Ventricular Rate:  53 PR Interval:    QRS Duration: 165 QT Interval:  538 QTC Calculation: 506 R Axis:   -64 Text Interpretation: Sinus rhythm RBBB and LAFB Left ventricular hypertrophy Mild STE inferiorly and ST depression I and aVL Confirmed by Virgel Manifold [phone]) on 12/19/2019 6:57:44 PM       BP 117/61 (BP Location: Right Arm)   Pulse 63   Temp (!) 100.9 F (38.3 C) (Rectal)   Resp 17   SpO2 96%       Carlisle Cater, PA-C 12/19/19 2233    Hayden Rasmussen, MD 12/20/19 1224

## 2019-12-19 NOTE — Progress Notes (Signed)
A consult was received from an ED physician for Vancomycin per pharmacy dosing for pneumonia.    The patient's profile has been reviewed for ht/wt/allergies/indication/available labs.    A one time order has been placed for Vancomycin 1551m IV x 1.  Cefepime dose also adjusted to 2gm IV x 1.  Further antibiotics/pharmacy consults should be ordered by admitting physician if indicated.                       Thank you, PEverette Rank PharmD 12/19/2019  7:39 PM

## 2019-12-19 NOTE — ED Provider Notes (Signed)
[facility] DEPT Provider Note   CSN: [id number] Arrival date & time: 12/19/19  1803     History Chief Complaint  Patient presents with  . COVID POSITIVE    Anthony Hayes is a 84 y.o. male with a past medical history significant for depression, hypertension, TIA, CHF who presents for evaluation of hypoxia.  Patient resident of [facility].  Tested positive for Covid on 11/30/2019.  He had been doing well up until yesterday when he developed a fever, decreased appetite and was noted to be hypoxic.  When EMS arrived patient was 88% on room air.  He was placed on 3 L of oxygen.  He had a oral temperature 101.9 with EMS and 1000 g of Tylenol was administered.  Patient arrives with DNR paperwork at bedside  Level 5 caveat-dementia  Collateral information from family-family states that patient has been coughing on liquids over the last 3 days.  He is at his baseline mentation, oriented to person however not place and time.  He is normally not able to answer very many questions.  He has chronic diarrhea at baseline.   Daughter- Anthony Hayes [phone] (Medical POA)   Confirmed DNR status ( NO COMPRESSIONS) however they would like intubation, anticoagulation, Abx, IVF if needed    HPI     Past Medical History:  Diagnosis Date  . Arthritis    "joints" (10/15/2017)  . Benign prostatic hyperplasia   . Complication of anesthesia    "last OR in 06/2017 affected him cognitively; hasn't got back to baseline yet" (10/15/2017)  . Dementia ([place])   . Depression   . GERD (gastroesophageal reflux disease)   . High cholesterol   . Hypertension   . Hypothyroidism   . Renal disease   . Renal disorder   . TIA (transient ischemic attack) early 2000s    Patient Active Problem List   Diagnosis Date Noted  . HCAP (healthcare-associated pneumonia) 11/22/2018  . Fall 11/22/2018  . Chronic diastolic CHF (congestive heart failure) ([place]) 11/22/2018  . Hypoxia    . Bradycardia 10/28/2018  . Leg swelling 10/28/2018  . Irregular heart beat 09/08/2018  . Sleep disturbance   . Stage 3 chronic kidney disease   . Benign essential HTN   . Hypokalemia   . Hyponatremia   . Infarction of right basal ganglia ([place]) 04/06/2018  . Pressure injury of skin 04/06/2018  . Vitamin B12 deficiency   . Hypothyroidism   . Crohn's disease with complication ([place])   . Benign prostatic hyperplasia   . Acute lower UTI   . Dementia without behavioral disturbance ([place])   . Acute ischemic stroke ([place]) 04/05/2018  . Leukocytosis   . Shortness of breath   . Acute encephalopathy   . CVA (cerebral vascular accident) ([place]) 04/03/2018  . Calculus, kidney 01/21/2018  . Acute blood loss anemia   . Mallory-Weiss syndrome   . Esophageal stricture   . Respiratory failure ([place])   . UGIB (upper gastrointestinal bleed) 10/16/2017  . GERD (gastroesophageal reflux disease) 10/16/2017  . Essential hypertension, benign 10/16/2017  . Chest pain 10/15/2017  . Hypothyroidism (acquired) 06/08/2017  . Chronic kidney disease, stage III (moderate) 03/19/2016  . Dyslipidemia 03/19/2016  . Degeneration of lumbar or lumbosacral intervertebral disc 07/10/2008  . Arthropathy, lower leg 05/23/2004    Past Surgical History:  Procedure Laterality Date  . ABDOMINAL EXPLORATION SURGERY  7/  . APPENDECTOMY    . CATARACT EXTRACTION W/ INTRAOCULAR LENS  IMPLANT, BILATERAL Bilateral   .  COLECTOMY  1950s   "thought he had iteilis"  . ESOPHAGOGASTRODUODENOSCOPY N/A 10/22/2017   Procedure: ESOPHAGOGASTRODUODENOSCOPY (EGD);  Surgeon: Gatha Mayer, MD;  [facility]: [facility] ENDOSCOPY;  Service: Endoscopy;  Laterality: N/A;  . ESOPHAGOGASTRODUODENOSCOPY (EGD) WITH PROPOFOL N/A 10/16/2017   Procedure: ESOPHAGOGASTRODUODENOSCOPY (EGD) WITH PROPOFOL;  Surgeon: Milus Banister, MD;  Location[facility] ENDOSCOPY;  Service: Endoscopy;  Laterality: N/A;  . FEMUR FRACTURE SURGERY Right 1950s  . FRACTURE SURGERY    .  KNEE ARTHROSCOPY Left   . POSTERIOR LUMBAR FUSION    . ROTATOR CUFF REPAIR Left   . TONSILLECTOMY         Family History  Problem Relation Age of Onset  . Hypertension Father     Social History   Tobacco Use  . Smoking status: Former Smoker    Years: 7.00    Types: Cigarettes    Quit date: 1963    Years since quitting: 58.0  . Smokeless tobacco: Never Used  Substance Use Topics  . Alcohol use: Yes    Comment: 10/15/2017 "a few drinks/year"  . Drug use: No    Home Medications Prior to Admission medications   Medication Sig Start Date End Date Taking? Authorizing Provider  acetaminophen (TYLENOL) 325 MG tablet Take 650 mg by mouth every 6 (six) hours as needed for mild pain.   Yes [provider]  amLODipine (NORVASC) 5 MG tablet Take 1 tablet (5 mg total) by mouth daily. 11/26/18  Yes Gherghe, Vella [place]head, MD  Cholecalciferol (VITAMIN D3) 25 MCG (1000 UT) CAPS Take 1,000 Units by mouth daily.   Yes [provider]  clopidogrel (PLAVIX) 75 MG tablet Take 1 tablet (75 mg total) by mouth daily. 11/29/18  Yes Minus Breeding, MD  cyanocobalamin (,VITAMIN B-12,) 1000 MCG/ML injection Inject 1,000 mcg into the muscle every 30 (thirty) days.   Yes [provider]  diphenhydrAMINE (BENADRYL) 12.5 MG/5ML elixir Take 12.5 mg by mouth 2 (two) times daily. Ends 1.1.2021   Yes [provider]  hydrALAZINE (APRESOLINE) 25 MG tablet Take 25 mg by mouth 3 (three) times daily.   Yes [provider]  Melatonin 3 MG SUBL Place 6 mg under the tongue at bedtime.   Yes [provider]  pantoprazole (PROTONIX) 40 MG tablet Take 1 tablet (40 mg total) by mouth daily. 04/05/18  Yes Dhungel, Nishant, MD  pravastatin (PRAVACHOL) 10 MG tablet Take 1 tablet (10 mg total) by mouth daily. 06/06/18  Yes McCue, Janett Billow, NP  sertraline (ZOLOFT) 100 MG tablet Take 1 tablet by mouth daily. 08/17/18  Yes [provider]  sertraline (ZOLOFT) 25 MG tablet Take  25 mg by mouth daily. Take with 134m zoloft to = 1276m  Yes [provider]    Allergies    Lisinopril  Review of Systems   Review of Systems  Unable to perform ROS: Dementia  All other systems reviewed and are negative.   Physical Exam Updated Vital Signs BP (!) 113/56   Pulse 68   Temp (!) 100.9 F (38.3 C) (Rectal)   Resp (!) 22   SpO2 98%   Physical Exam Vitals and nursing note reviewed.  Constitutional:      General: He is not in acute distress.    Appearance: He is well-developed. He is ill-appearing. He is not toxic-appearing or diaphoretic.  HENT:     Head: Normocephalic and atraumatic.     Mouth/Throat:     Comments: Dry mucous membranes tongue midline Eyes:  Pupils: Pupils are equal, round, and reactive to light.     Comments: PERRLA, EOM intact  Neck:     Comments: Moves neck freely Cardiovascular:     Rate and Rhythm: Normal rate and regular rhythm.     Pulses: Normal pulses.     Heart sounds: Normal heart sounds.  Pulmonary:     Effort: Pulmonary effort is normal. No respiratory distress.     Breath sounds: Wheezing present.     Comments: Mild diffuse expiratory wheeze. 3L [place] Abdominal:     General: Bowel sounds are normal. There is no distension.     Palpations: Abdomen is soft.     Tenderness: There is no abdominal tenderness.     Hernia: No hernia is present.     Comments: Soft non tender without rebound or guarding  Musculoskeletal:        General: Normal range of motion.     Cervical back: Normal range of motion and neck supple.  Skin:    General: Skin is warm and dry.     Comments: Pallor, no rashes or lesions.  Neurological:     Mental Status: He is alert.     Comments: Altered, not oriented to person, place or time.    ED Results / Procedures / Treatments   Labs (all labs ordered are listed, but only abnormal results are displayed) Labs Reviewed  CBC WITH DIFFERENTIAL/PLATELET - Abnormal; Notable for the following  components:      Result Value   WBC 11.5 (*)    RBC 3.65 (*)    Hemoglobin 11.2 (*)    HCT 33.0 (*)    Neutro Abs 8.7 (*)    Monocytes Absolute 1.3 (*)    All other components within normal limits  D-DIMER, QUANTITATIVE (NOT AT [facility]) - Abnormal; Notable for the following components:   D-Dimer, Quant 1.49 (*)    All other components within normal limits  FIBRINOGEN - Abnormal; Notable for the following components:   Fibrinogen 739 (*)    All other components within normal limits  URINALYSIS, ROUTINE W REFLEX MICROSCOPIC - Abnormal; Notable for the following components:   Color, Urine AMBER (*)    Protein, ur 100 (*)    Leukocytes,Ua MODERATE (*)    WBC, UA >50 (*)    Bacteria, UA RARE (*)    All other components within normal limits  TROPONIN I (HIGH SENSITIVITY) - Abnormal; Notable for the following components:   Troponin I (High Sensitivity) 33 (*)    All other components within normal limits  CULTURE, BLOOD (ROUTINE X 2)  CULTURE, BLOOD (ROUTINE X 2)  URINE CULTURE  SARS CORONAVIRUS 2 (TAT 6-24 HRS)  LACTIC ACID, PLASMA  COMPREHENSIVE METABOLIC PANEL  PROCALCITONIN  LACTATE DEHYDROGENASE  FERRITIN  TRIGLYCERIDES  C-REACTIVE PROTEIN  POC SARS CORONAVIRUS 2 AG -  ED  TROPONIN I (HIGH SENSITIVITY)    EKG EKG Interpretation  Date/Time:  Tuesday December 19 2019 18:41:45 EST Ventricular Rate:  53 PR Interval:    QRS Duration: 165 QT Interval:  538 QTC Calculation: 506 R Axis:   -64 Text Interpretation: Sinus rhythm RBBB and LAFB Left ventricular hypertrophy Mild STE inferiorly and ST depression I and aVL Confirmed by Virgel Manifold [phone]) on 12/19/2019 6:57:44 PM   Radiology DG Chest Port 1 View  Result Date: 12/19/2019 CLINICAL DATA:  Covert pneumonia.  Hypoxia. EXAM: PORTABLE CHEST 1 VIEW COMPARISON:  11/21/2018 FINDINGS: Mild cardiomegaly. Indistinct peripheral airspace opacity noted in the right  upper lobe and peripherally in the left mid lung. Suspected  mild bibasilar atelectasis. Atherosclerotic calcification of the aortic arch. No pleural effusion is identified. IMPRESSION: 1. Hazy an indistinctly marginated peripheral airspace opacities in the right upper lobe and left mid lung favoring pneumonia over mild edema. 2. Mild enlargement of the cardiopericardial silhouette. Electronically Signed   By: Van Clines M.D.   On: 12/19/2019 18:58    Procedures Procedures (including critical care time)  Medications Ordered in ED Medications  0.9 %  sodium chloride infusion (1,000 mLs Intravenous New Bag/Given (Non-Interop) 12/19/19 1850)  Ampicillin-Sulbactam (UNASYN) 3 g in sodium chloride 0.9 % 100 mL IVPB (3 g Intravenous New Bag/Given (Non-Interop) 12/19/19 2046)  albuterol (VENTOLIN HFA) 108 (90 Base) MCG/ACT inhaler 6 puff (6 puffs Inhalation Given 12/19/19 1851)  dexamethasone (DECADRON) injection 10 mg (10 mg Intravenous Given 12/19/19 1850)    ED Course  I have reviewed the triage vital signs and the nursing notes.  Pertinent labs & imaging results that were available during my care of the patient were reviewed by me and considered in my medical decision making (see chart for details).  84 year old history of dementia presents for evaluation of fever, hypoxia.  Diagnosed with Covid on 12/27/2019.  Mild diffuse expiratory wheeze.  He is on 4 L oxygen via nasal cannula which she does not normally require.  Febrile to 101.9 with EMS however given 1000 mg of Tylenol patient that tachycardia however he has some mild tachypnea to 21.  No evidence of rashes, lesions.  Abdomen soft, nontender.  No evidence of DVT on exam.  Plan for labs, EKG, imaging.  Patient will need admission for hypoxia.  Unfortunately his Covid test was not done in [facility] facility, will need to retest him. Will give decadron and albuterol or wheeze and known COVID.  Daughter, [name] does state that patient has been coughing and choking on liquids over the last 3 days.   Possible aspiration pneumonia as cause of his pneumonia given he is 20 days out from his known Covid infection?  Will cover with antibiotics for aspiration pneumonia.  Labs and imaging personally reviewed:  Clinical Course as of Dec 18 2052  Tue Dec 19, 2019  2048 Leukocytosis, hemoglobin at baseline  CBC WITH DIFFERENTIAL(!) [BH]  2048 Elevated, will order CTA  D-dimer, quantitative(!) [BH]  2048 Hazy opacities consistent with pneumonia bilaterally   [BH]  2048 ST changes   [BH]  2048 Elevated, will need to trend.  Troponin I (High Sensitivity)(!) [BH]  2049 1.1  Lactic acid, plasma [BH]  2049 Rapid negative however positive on 12/3 at nursing facility  POC SARS Coronavirus 2 Ag-ED - Nasal Swab (BD Veritor Kit) [BH]  2049 Moderate Leuko, rare bacteria, Will culture, Last UA with Klebsiella  Urinalysis, Routine w reflex microscopic(!) [BH]    Clinical Course User Index [BH] Lavonna Lampron A, PA-C   Patient reassessed.  Comfortable on 3 L via nasal cannula.  Mild tachypnea to 22 however no tachycardia.  He was given Tylenol PTA for his fever, does not need additional Tylenol this time.  Was given Unasyn given family history consistent with choking on liquids to cover for aspiration pneumonia given he does have a leukocytosis.  His D-dimer was elevated, question elevation from his Covid diagnosis versus a sudden onset shortness of breath concern for PE.  Will obtain CTA chest to rule out PE.  Patient will need admission to hospital for hypoxia.  Care transfered to [place], [place]  will follow up on remaining imaging and labs.  Discussed this plan with patient's daughter [name] who is agreeable to plan.  Arye Weyenberg was evaluated in Emergency Department on 12/19/2019 for the symptoms described in the history of present illness. He was evaluated in the context of the global COVID-19 pandemic, which necessitated consideration that the patient might be at risk for infection with the SARS-CoV-2  virus that causes COVID-19. Institutional protocols and algorithms that pertain to the evaluation of patients at risk for COVID-19 are in a state of rapid change based on information released by regulatory bodies including the CDC and federal and state organizations. These policies and algorithms were followed during the patient's care in the ED.  MDM Rules/Calculators/A&P                      Final Clinical Impression(s) / ED Diagnoses Final diagnoses:  Hypoxia  COVID-19  Elevated troponin    Rx / DC Orders ED Discharge Orders    None       Charna Neeb A, PA-C 12/19/19 2053    Virgel Manifold, MD 12/24/19 1031

## 2019-12-19 NOTE — H&P (Signed)
TRH H&P    Patient Demographics:    Anthony Hayes, is a 84 y.o. male  MRN: [id number]  DOB - February 12, 1935  Admit Date - 12/19/2019  Referring MD/NP/PA: Alecia Lemming  Outpatient Primary MD for the patient is Etter Sjogren, DO  Patient coming from:  [place]  Chief complaint-  Fever, hypoxia   HPI:    Anthony Hayes  is a 84 y.o. male,  w hypertension, hyperlipidemia, ckd stage3,  h/o CVA, hypothyroidism, depression, dementia, apparently lives at [facility] and noted to be covid-19 positive, sent to ER for fever, and hypoxia.   In Ed,  T 98.4, P 48, R 21, Bp 123/65  Pox 95% on 3L  CTA chest IMPRESSION: Slightly suboptimal opacification of the main pulmonary artery. No central or segmental pulmonary embolism.  Multifocal patchy/ground-glass opacities throughout both lungs, consistent with COVID pneumonia.     Wbc 11.5, Hgb 11.2, Plt 290 Na 140, K 2.6, Bun 27, Creatinie 1.15 Ast 17, Alt 14,  Alb 3.0 D dimer 1.49 procalcitonin 0.24 LDH 165 Ferritin 307 Fibrinogen 739 crp 14.9,  Trop 33  Urinalysis wbc >50, rbc 0-5  Blood culture x2 pending  Pt will be admitted for hypokalemia, covid-19 infection and acute lower uti.      Review of systems:    In addition to the HPI above, pt is unable to provide meaningful history due to dementia  No Headache, No changes with Vision or hearing, No problems swallowing food or Liquids, No Chest pain, No Abdominal pain, No Nausea or Vomiting, bowel movements are regular, No Blood in stool or Urine, No dysuria, No new skin rashes or bruises, No new joints pains-aches,  No new weakness, tingling, numbness in any extremity, No recent weight gain or loss, No polyuria, polydypsia or polyphagia, No significant Mental Stressors.  All other systems reviewed and are negative.    Past History of the following :    Past Medical History:    Diagnosis Date  . Arthritis    "joints" (10/15/2017)  . Benign prostatic hyperplasia   . Complication of anesthesia    "last OR in 06/2017 affected him cognitively; hasn't got back to baseline yet" (10/15/2017)  . Dementia ([place])   . Depression   . GERD (gastroesophageal reflux disease)   . High cholesterol   . Hypertension   . Hypothyroidism   . Renal disease   . Renal disorder   . TIA (transient ischemic attack) early 2000s      Past Surgical History:  Procedure Laterality Date  . ABDOMINAL EXPLORATION SURGERY  7/  . APPENDECTOMY    . CATARACT EXTRACTION W/ INTRAOCULAR LENS  IMPLANT, BILATERAL Bilateral   . COLECTOMY  1950s   "thought he had iteilis"  . ESOPHAGOGASTRODUODENOSCOPY N/A 10/22/2017   Procedure: ESOPHAGOGASTRODUODENOSCOPY (EGD);  Surgeon: Gatha Mayer, MD;  Location: [facility] END[facility]COPY;  Service: Endoscopy;  Laterality: N/A;  . ESOPHAGOGASTRODUODENOSCOPY (EGD) WITH PROPOFOL N/A 10/16/2017   Procedure: ESOPHAGOGASTRODUODENOSCOPY (EGD) WITH PROPOFOL;  Surgeon: Milus Banister, MD;  Location: [place];  Service: Endoscopy;  Laterality: N/A;  . FEMUR FRACTURE SURGERY Right 1950s  . FRACTURE SURGERY    . KNEE ARTHROSCOPY Left   . POSTERIOR LUMBAR FUSION    . ROTATOR CUFF REPAIR Left   . TONSILLECTOMY        Social History:      Social History   Tobacco Use  . Smoking status: Former Smoker    Years: 7.00    Types: Cigarettes    Quit date: 1963    Years since quitting: 58.0  . Smokeless tobacco: Never Used  Substance Use Topics  . Alcohol use: Yes    Comment: 10/15/2017 "a few drinks/year"       Family History :     Family History  Problem Relation Age of Onset  . Hypertension Father        Home Medications:   Prior to Admission medications   Medication Sig Start Date End Date Taking? Authorizing Provider  acetaminophen (TYLENOL) 325 MG tablet Take 650 mg by mouth every 6 (six) hours as needed for mild pain.   Yes [provider]   amLODipine (NORVASC) 5 MG tablet Take 1 tablet (5 mg total) by mouth daily. 11/26/18  Yes Gherghe, Vella Redhead, MD  Cholecalciferol (VITAMIN D3) 25 MCG (1000 UT) CAPS Take 1,000 Units by mouth daily.   Yes [provider]  clopidogrel (PLAVIX) 75 MG tablet Take 1 tablet (75 mg total) by mouth daily. 11/29/18  Yes Minus Breeding, MD  cyanocobalamin (,VITAMIN B-12,) 1000 MCG/ML injection Inject 1,000 mcg into the muscle every 30 (thirty) days.   Yes [provider]  diphenhydrAMINE (BENADRYL) 12.5 MG/5ML elixir Take 12.5 mg by mouth 2 (two) times daily. Ends 1.1.2021   Yes [provider]  hydrALAZINE (APRESOLINE) 25 MG tablet Take 25 mg by mouth 3 (three) times daily.   Yes [provider]  Melatonin 3 MG SUBL Place 6 mg under the tongue at bedtime.   Yes [provider]  pantoprazole (PROTONIX) 40 MG tablet Take 1 tablet (40 mg total) by mouth daily. 04/05/18  Yes Dhungel, Nishant, MD  pravastatin (PRAVACHOL) 10 MG tablet Take 1 tablet (10 mg total) by mouth daily. 06/06/18  Yes McCue, Janett Billow, NP  sertraline (ZOLOFT) 100 MG tablet Take 1 tablet by mouth daily. 08/17/18  Yes [provider]  sertraline (ZOLOFT) 25 MG tablet Take 25 mg by mouth daily. Take with 143m zoloft to = 126m  Yes [provider]     Allergies:     Allergies  Allergen Reactions  . Lisinopril Swelling    Swelling of lips      Physical Exam:   Vitals  Blood pressure 117/61, pulse 63, temperature (!) 100.9 F (38.3 C), temperature source Rectal, resp. rate 17, SpO2 96 %.  1.  General: axoxo1 ( person, baseline per daughter)  2. Psychiatric: euthymic  3. Neurologic: cn2-12 intact, reflexes 2+ symmetric, diffuse with no clonus, motor 5/5 in all 4 exty  4. HEENMT:  Anicteric, pupils 1.14m27mymmetric, direct, consensual, intact Neck: no jvd  5. Respiratory : Slight crackles bilateral base l>r, no wheezing,   6. Cardiovascular : rrr s1, s2, no  m/g/r  7. Gastrointestinal:  Abd: soft, nt, nd, +bs  8. Skin:  Ext: no c/c/e,  No rash  9.Musculoskeletal:  Good ROM      Data Review:    CBC Recent Labs  Lab 12/19/19 1900  WBC 11.5*  HGB 11.2*  HCT 33.0*  PLT  290  MCV 90.4  MCH 30.7  MCHC 33.9  RDW 13.0  LYMPHSABS 1.5  MONOABS 1.3*  EOSABS 0.0  BASOSABS 0.0   ------------------------------------------------------------------------------------------------------------------  Results for orders placed or performed during the hospital encounter of 12/19/19 (from the past 48 hour(s))  Lactic acid, plasma     Status: None   Collection Time: 12/19/19  7:00 PM  Result Value Ref Range   Lactic Acid, Venous 1.1 0.5 - 1.9 mmol/L    Comment: Performed at [facility], [place] [address], [place], [place] [address]  CBC WITH DIFFERENTIAL     Status: Abnormal   Collection Time: 12/19/19  7:00 PM  Result Value Ref Range   WBC 11.5 (H) 4.0 - 10.5 K/uL   RBC 3.65 (L) 4.22 - 5.81 MIL/uL   Hemoglobin 11.2 (L) 13.0 - 17.0 g/dL   HCT 33.0 (L) 39.0 - 52.0 %   MCV 90.4 80.0 - 100.0 fL   MCH 30.7 26.0 - 34.0 pg   MCHC 33.9 30.0 - 36.0 g/dL   RDW 13.0 11.5 - 15.5 %   Platelets 290 150 - 400 K/uL   nRBC 0.0 0.0 - 0.2 %   Neutrophils Relative % 75 %   Neutro Abs 8.7 (H) 1.7 - 7.7 K/uL   Lymphocytes Relative 13 %   Lymphs Abs 1.5 0.7 - 4.0 K/uL   Monocytes Relative 11 %   Monocytes Absolute 1.3 (H) 0.1 - 1.0 K/uL   Eosinophils Relative 0 %   Eosinophils Absolute 0.0 0.0 - 0.5 K/uL   Basophils Relative 0 %   Basophils Absolute 0.0 0.0 - 0.1 K/uL   Immature Granulocytes 1 %   Abs Immature Granulocytes 0.07 0.00 - 0.07 K/uL    Comment: Performed at [facility], [place] [address] [place], [place] [address]  Comprehensive metabolic panel     Status: Abnormal   Collection Time: 12/19/19  7:00 PM  Result Value Ref Range   Sodium 140 135 - 145 mmol/L   Potassium 2.6 (LL) 3.5 - 5.1 mmol/L     Comment: CRITICAL RESULT CALLED TO, READ BACK BY AND VERIFIED WITH: OXENDINE,J @ 2121 ON 195093 BY POTEAT,S    Chloride 110 98 - 111 mmol/L   CO2 20 (L) 22 - 32 mmol/L   Glucose, Bld 135 (H) 70 - 99 mg/dL   BUN 27 (H) 8 - 23 mg/dL   Creatinine, Ser 1.15 0.61 - 1.24 mg/dL   Calcium 8.4 (L) 8.9 - 10.3 mg/dL   Total Protein 6.3 (L) 6.5 - 8.1 g/dL   Albumin 3.0 (L) 3.5 - 5.0 g/dL   AST 17 15 - 41 U/L   ALT 14 0 - 44 U/L   Alkaline Phosphatase 79 38 - 126 U/L   Total Bilirubin 1.2 0.3 - 1.2 mg/dL   GFR calc non Af Amer 59 (L) >60 mL/min   GFR calc Af Amer >60 >60 mL/min   Anion gap 10 5 - 15    Comment: Performed at [facility], [place] [address], [place], [place] [address]  D-dimer, quantitative     Status: Abnormal   Collection Time: 12/19/19  7:00 PM  Result Value Ref Range   D-Dimer, Quant 1.49 (H) 0.00 - 0.50 ug/mL-FEU    Comment: (NOTE) At the manufacturer cut-off of 0.50 ug/mL FEU, this assay has been documented to exclude PE with a sensitivity and negative predictive value of 97 to 99%.  At this time, this assay has  not been approved by the FDA to exclude DVT/VTE. Results should be correlated with clinical presentation. Performed at [facility], [place] [address] [place], [place] [address]   Procalcitonin     Status: None   Collection Time: 12/19/19  7:00 PM  Result Value Ref Range   Procalcitonin 0.24 ng/mL    Comment:        Interpretation: PCT (Procalcitonin) <= 0.5 ng/mL: Systemic infection (sepsis) is not likely. Local bacterial infection is possible. (NOTE)       Sepsis PCT Algorithm           Lower Respiratory Tract                                      Infection PCT Algorithm    ----------------------------     ----------------------------         PCT < 0.25 ng/mL                PCT < 0.10 ng/mL         Strongly encourage             Strongly discourage   discontinuation of antibiotics    initiation of antibiotics     ----------------------------     -----------------------------       PCT 0.25 - 0.50 ng/mL            PCT 0.10 - 0.25 ng/mL               OR       >80% decrease in PCT            Discourage initiation of                                            antibiotics      Encourage discontinuation           of antibiotics    ----------------------------     -----------------------------         PCT >= 0.50 ng/mL              PCT 0.26 - 0.50 ng/mL               AND        <80% decrease in PCT             Encourage initiation of                                             antibiotics       Encourage continuation           of antibiotics    ----------------------------     -----------------------------        PCT >= 0.50 ng/mL                  PCT > 0.50 ng/mL               AND         increase in PCT                  Strongly encourage  initiation of antibiotics    Strongly encourage escalation           of antibiotics                                     -----------------------------                                           PCT <= 0.25 ng/mL                                                 OR                                        > 80% decrease in PCT                                     Discontinue / Do not initiate                                             antibiotics Performed at [place] [address] [place], [place] [address]   Lactate dehydrogenase     Status: None   Collection Time: 12/19/19  7:00 PM  Result Value Ref Range   LDH 165 98 - 192 U/L    Comment: Performed at [facility], [place] [address], [place], [place] [address]  Ferritin     Status: None   Collection Time: 12/19/19  7:00 PM  Result Value Ref Range   Ferritin 307 24 - 336 ng/mL    Comment: Performed at [facility], [place] [address], [place], [place] [address]  Triglycerides     Status: None   Collection Time:  12/19/19  7:00 PM  Result Value Ref Range   Triglycerides 107 <150 mg/dL    Comment: Performed at [facility], [place] [address], [place], [place] [address]  Fibrinogen     Status: Abnormal   Collection Time: 12/19/19  7:00 PM  Result Value Ref Range   Fibrinogen 739 (H) 210 - 475 mg/dL    Comment: Performed at [facility], [place] [address] [place], [place] [address]  C-reactive protein     Status: Abnormal   Collection Time: 12/19/19  7:00 PM  Result Value Ref Range   CRP 14.9 (H) <1.0 mg/dL    Comment: Performed at [facility], [place] [address] [place], [place] [address]  Troponin I (High Sensitivity)     Status: Abnormal   Collection Time: 12/19/19  7:00 PM  Result Value Ref Range   Troponin I (High Sensitivity) 33 (H) <18 ng/L    Comment: (NOTE) Elevated high sensitivity troponin I (hsTnI) values and significant  changes across serial measurements may suggest ACS but many other  chronic and acute conditions are known to elevate hsTnI results.  Refer  to the "Links" section for chest pain algorithms and additional  guidance. Performed at [facility], [place] [address], [place], [place] [address]   POC SARS Coronavirus 2 Ag-ED - Nasal Swab (BD Veritor Kit)     Status: None   Collection Time: 12/19/19  7:21 PM  Result Value Ref Range   SARS Coronavirus 2 Ag NEGATIVE NEGATIVE    Comment: (NOTE) SARS-CoV-2 antigen NOT DETECTED.  Negative results are presumptive.  Negative results do not preclude SARS-CoV-2 infection and should not be used as the sole basis for treatment or other patient management decisions, including infection  control decisions, particularly in the presence of clinical signs and  symptoms consistent with COVID-19, or in those who have been in contact with the virus.  Negative results must be combined with clinical observations, patient history, and epidemiological information. The expected  result is Negative. Fact Sheet for Patients: PodPark.tn Fact Sheet for Healthcare Providers: GiftContent.is This test is not yet approved or cleared by the [place] FDA and  has been authorized for detection and/or diagnosis of SARS-CoV-2 by FDA under an Emergency Use Authorization (EUA).  This EUA will remain in effect (meaning this test can be used) for the duration of  the COVID-19 de claration under Section 564(b)(1) of the Act, 21 U.S.C. section 360bbb-3(b)(1), unless the authorization is terminated or revoked sooner.   Urinalysis, Routine w reflex microscopic     Status: Abnormal   Collection Time: 12/19/19  7:25 PM  Result Value Ref Range   Color, Urine AMBER (A) YELLOW    Comment: BIOCHEMICALS MAY BE AFFECTED BY COLOR   APPearance CLEAR CLEAR   Specific Gravity, Urine 1.026 1.005 - 1.030   pH 5.0 5.0 - 8.0   Glucose, UA NEGATIVE NEGATIVE mg/dL   Hgb urine dipstick NEGATIVE NEGATIVE   Bilirubin Urine NEGATIVE NEGATIVE   Ketones, ur NEGATIVE NEGATIVE mg/dL   Protein, ur 100 (A) NEGATIVE mg/dL   Nitrite NEGATIVE NEGATIVE   Leukocytes,Ua MODERATE (A) NEGATIVE   RBC / HPF 0-5 0 - 5 RBC/hpf   WBC, UA >50 (H) 0 - 5 WBC/hpf   Bacteria, UA RARE (A) NONE SEEN   Squamous Epithelial / LPF 0-5 0 - 5   Mucus PRESENT     Comment: Performed at [facility], [place] [address], [place], [place] [address]  Troponin I (High Sensitivity)     Status: Abnormal   Collection Time: 12/19/19  8:50 PM  Result Value Ref Range   Troponin I (High Sensitivity) 32 (H) <18 ng/L    Comment: (NOTE) Elevated high sensitivity troponin I (hsTnI) values and significant  changes across serial measurements may suggest ACS but many other  chronic and acute conditions are known to elevate hsTnI results.  Refer to the "Links" section for chest pain algorithms and additional  guidance. Performed at [facility],  [place] [address] [place], [place] [address]     Chemistries  Recent Labs  Lab 12/19/19 1900  NA 140  K 2.6*  CL 110  CO2 20*  GLUCOSE 135*  BUN 27*  CREATININE 1.15  CALCIUM 8.4*  AST 17  ALT 14  ALKPHOS 79  BILITOT 1.2   ------------------------------------------------------------------------------------------------------------------  ------------------------------------------------------------------------------------------------------------------ GFR: CrCl cannot be calculated (Unknown ideal weight.). Liver Function Tests: Recent Labs  Lab 12/19/19 1900  AST 17  ALT 14  ALKPHOS 79  BILITOT 1.2  PROT 6.3*  ALBUMIN 3.0*   No results for input(s): LIPASE, AMYLASE in  the last 168 hours. No results for input(s): AMMONIA in the last 168 hours. Coagulation Profile: No results for input(s): INR, PROTIME in the last 168 hours. Cardiac Enzymes: No results for input(s): CKTOTAL, CKMB, CKMBINDEX, TROPONINI in the last 168 hours. BNP (last 3 results) No results for input(s): PROBNP in the last 8760 hours. HbA1C: No results for input(s): HGBA1C in the last 72 hours. CBG: No results for input(s): GLUCAP in the last 168 hours. Lipid Profile: Recent Labs    12/19/19 1900  TRIG 107   Thyroid Function Tests: No results for input(s): TSH, T4TOTAL, FREET4, T3FREE, THYROIDAB in the last 72 hours. Anemia Panel: Recent Labs    12/19/19 1900  FERRITIN 307    --------------------------------------------------------------------------------------------------------------- Urine analysis:    Component Value Date/Time   COLORURINE AMBER (A) 12/19/2019 1925   APPEARANCEUR CLEAR 12/19/2019 1925   LABSPEC 1.026 12/19/2019 1925   PHURINE 5.0 12/19/2019 1925   GLUCOSEU NEGATIVE 12/19/2019 1925   HGBUR NEGATIVE 12/19/2019 [place] NEGATIVE 12/19/2019 1925   BILIRUBINUR NEG 01/21/2018 2022   KETONESUR NEGATIVE 12/19/2019 1925   PROTEINUR 100 (A) 12/19/2019 1925    UROBILINOGEN 1.0 01/21/2018 2022   NITRITE NEGATIVE 12/19/2019 1925   LEUKOCYTESUR MODERATE (A) 12/19/2019 1925      Imaging Results:    CT Angio Chest PE W and/or Wo Contrast  Result Date: 12/19/2019 CLINICAL DATA:  Shortness of breath, COVID positive EXAM: CT ANGIOGRAPHY CHEST WITH CONTRAST TECHNIQUE: Multidetector CT imaging of the chest was performed using the standard protocol during bolus administration of intravenous contrast. Multiplanar CT image reconstructions and MIPs were obtained to evaluate the vascular anatomy. CONTRAST:  191m OMNIPAQUE IOHEXOL 350 MG/ML SOLN COMPARISON:  None. FINDINGS: Cardiovascular: There is slightly suboptimal opacification of the main pulmonary artery. However there is no filling defect in the central or segmental pulmonary arteries. There is mild cardiomegaly. No evidence of right ventricular heart strain. No pericardial effusion. There is normal three-vessel brachiocephalic anatomy without proximal stenosis. The thoracic aorta is normal in appearance. Coronary artery calcifications are seen. Mediastinum/Nodes: No hilar, mediastinal, or axillary adenopathy. Thyroid gland, trachea, and esophagus demonstrate no significant findings. Lungs/Pleura: Patchy/ground-glass peripherally based airspace opacities are seen throughout both lungs, predominantly within the right upper lung and lingula. There is also streaky airspace opacity at both lung bases. No pleural effusion or pneumothorax. Upper Abdomen: There is a small hiatal hernia. No acute abnormalities present in the visualized portions of the upper abdomen. Musculoskeletal: No chest wall abnormality. No acute or significant osseous findings. Review of the MIP images confirms the above findings. IMPRESSION: Slightly suboptimal opacification of the main pulmonary artery. No central or segmental pulmonary embolism. Multifocal patchy/ground-glass opacities throughout both lungs, consistent with COVID pneumonia. Aortic  Atherosclerosis (ICD10-I70.0). Electronically Signed   By: BPrudencio PairM.D.   On: 12/19/2019 22:17   DG Chest Port 1 View  Result Date: 12/19/2019 CLINICAL DATA:  Covert pneumonia.  Hypoxia. EXAM: PORTABLE CHEST 1 VIEW COMPARISON:  11/21/2018 FINDINGS: Mild cardiomegaly. Indistinct peripheral airspace opacity noted in the right upper lobe and peripherally in the left mid lung. Suspected mild bibasilar atelectasis. Atherosclerotic calcification of the aortic arch. No pleural effusion is identified. IMPRESSION: 1. Hazy an indistinctly marginated peripheral airspace opacities in the right upper lobe and left mid lung favoring pneumonia over mild edema. 2. Mild enlargement of the cardiopericardial silhouette. Electronically Signed   By: WVan ClinesM.D.   On: 12/19/2019 18:58       Assessment &  Plan:    Active Problems:   Chronic kidney disease, stage III (moderate)   Vitamin B12 deficiency   Hypothyroidism   Dementia without behavioral disturbance (HCC)   Hypokalemia   COVID-19 virus infection   Protein-calorie malnutrition, severe (HCC)  Acute respiratory failure with hypoxia Covid-19 infection Dexamethasone 56m iv qday Remdesivir pharmacy consult Monitor  Acute lower uti Urine culture Rocephin 1gm iv qday  Hypokalemia Replete Check cmp in am Check magnesium in am  Severe protein calorie malnutrition Pro stat 3107mpo bid  Hypertension Cont Amlodipine 72m41mo qday Cont Hydralazine 272m70m tid  Hyperlipidemia, h/o TIA Cont Plavix 772mg41mqday Cont Pravastatin 10mg 68may  Anxiety/ Dementia Cont Zoloft  Gerd Cont PPI   DVT Prophylaxis-   Lovenox - SCDs   AM Labs Ordered, also please review Full Orders  Family Communication: Admission, patients condition and plan of care including tests being ordered have been discussed with the patient  who indicate understanding and agree with the plan and Code Status.  Code Status:  DNR per daughter,  Spoke with Anita Veatrice Bourbonssion status: Inpatient: Based on patients clinical presentation and evaluation of above clinical data, I have made determination that patient meets Inpatient criteria at this time.  Pt has covid-19 pneumonia,  Pt has acute respiratory failure with hypoxia, pt will require iv steroids as well as iv remdesivir.  Pt has high risk of clinical deterioration and will require > 2 nites stay.   Time spent in minutes : 70 minutes   Odyn Turko Jani Graveln 12/19/2019 at 10:43 PM

## 2019-12-19 NOTE — ED Triage Notes (Signed)
Patient is from [facility] and was tested positive on 11/30/2019. Yesterday, he developed a fever, decrease appetite, and decreased oxygenation. When EMS arrived, he was 88% on room air, EMS applied oxygen at 3L [place] with an improved oxygenation level of 98%. Also, his temperature was 101.9 with EMS and TYLENOL, 1017m, was administered.

## 2019-12-20 DIAGNOSIS — N183 Chronic kidney disease, stage 3 unspecified: Secondary | ICD-10-CM

## 2019-12-20 DIAGNOSIS — U071 COVID-19: Principal | ICD-10-CM

## 2019-12-20 DIAGNOSIS — E039 Hypothyroidism, unspecified: Secondary | ICD-10-CM

## 2019-12-20 DIAGNOSIS — E876 Hypokalemia: Secondary | ICD-10-CM

## 2019-12-20 DIAGNOSIS — E538 Deficiency of other specified B group vitamins: Secondary | ICD-10-CM

## 2019-12-20 DIAGNOSIS — J1289 Other viral pneumonia: Secondary | ICD-10-CM

## 2019-12-20 DIAGNOSIS — F0390 Unspecified dementia without behavioral disturbance: Secondary | ICD-10-CM

## 2019-12-20 DIAGNOSIS — E43 Unspecified severe protein-calorie malnutrition: Secondary | ICD-10-CM

## 2019-12-20 LAB — CBC WITH DIFFERENTIAL/PLATELET
Abs Immature Granulocytes: 0.06 10*3/uL (ref 0.00–0.07)
Basophils Absolute: 0 10*3/uL (ref 0.0–0.1)
Basophils Relative: 0 %
Eosinophils Absolute: 0 10*3/uL (ref 0.0–0.5)
Eosinophils Relative: 0 %
HCT: 32.4 % — ABNORMAL LOW (ref 39.0–52.0)
Hemoglobin: 10.7 g/dL — ABNORMAL LOW (ref 13.0–17.0)
Immature Granulocytes: 1 %
Lymphocytes Relative: 5 %
Lymphs Abs: 0.5 10*3/uL — ABNORMAL LOW (ref 0.7–4.0)
MCH: 30.3 pg (ref 26.0–34.0)
MCHC: 33 g/dL (ref 30.0–36.0)
MCV: 91.8 fL (ref 80.0–100.0)
Monocytes Absolute: 0.3 10*3/uL (ref 0.1–1.0)
Monocytes Relative: 4 %
Neutro Abs: 8.3 10*3/uL — ABNORMAL HIGH (ref 1.7–7.7)
Neutrophils Relative %: 90 %
Platelets: 256 10*3/uL (ref 150–400)
RBC: 3.53 MIL/uL — ABNORMAL LOW (ref 4.22–5.81)
RDW: 13.2 % (ref 11.5–15.5)
WBC: 9.2 10*3/uL (ref 4.0–10.5)
nRBC: 0 % (ref 0.0–0.2)

## 2019-12-20 LAB — COMPREHENSIVE METABOLIC PANEL
ALT: 14 U/L (ref 0–44)
ALT: 14 U/L (ref 0–44)
AST: 17 U/L (ref 15–41)
AST: 19 U/L (ref 15–41)
Albumin: 2.7 g/dL — ABNORMAL LOW (ref 3.5–5.0)
Albumin: 2.8 g/dL — ABNORMAL LOW (ref 3.5–5.0)
Alkaline Phosphatase: 77 U/L (ref 38–126)
Alkaline Phosphatase: 72 U/L (ref 38–126)
Anion gap: 10 (ref 5–15)
Anion gap: 9 (ref 5–15)
BUN: 27 mg/dL — ABNORMAL HIGH (ref 8–23)
BUN: 27 mg/dL — ABNORMAL HIGH (ref 8–23)
CO2: 20 mmol/L — ABNORMAL LOW (ref 22–32)
CO2: 19 mmol/L — ABNORMAL LOW (ref 22–32)
Calcium: 8.7 mg/dL — ABNORMAL LOW (ref 8.9–10.3)
Calcium: 8.8 mg/dL — ABNORMAL LOW (ref 8.9–10.3)
Chloride: 112 mmol/L — ABNORMAL HIGH (ref 98–111)
Chloride: 115 mmol/L — ABNORMAL HIGH (ref 98–111)
Creatinine, Ser: 1.09 mg/dL (ref 0.61–1.24)
Creatinine, Ser: 0.98 mg/dL (ref 0.61–1.24)
GFR calc Af Amer: >60 mL/min (ref >60)
GFR calc Af Amer: >60 mL/min (ref >60)
GFR calc non Af Amer: >60 mL/min (ref >60)
GFR calc non Af Amer: >60 mL/min (ref >60)
Glucose, Bld: 200 mg/dL — ABNORMAL HIGH (ref 70–99)
Glucose, Bld: 142 mg/dL — ABNORMAL HIGH (ref 70–99)
Potassium: 2.8 mmol/L — ABNORMAL LOW (ref 3.5–5.1)
Potassium: 3 mmol/L — ABNORMAL LOW (ref 3.5–5.1)
Sodium: 142 mmol/L (ref 135–145)
Sodium: 143 mmol/L (ref 135–145)
Total Bilirubin: 0.7 mg/dL (ref 0.3–1.2)
Total Bilirubin: 0.5 mg/dL (ref 0.3–1.2)
Total Protein: 6.3 g/dL — ABNORMAL LOW (ref 6.5–8.1)
Total Protein: 6.3 g/dL — ABNORMAL LOW (ref 6.5–8.1)

## 2019-12-20 LAB — TSH: TSH: 0.642 u[IU]/mL (ref 0.350–4.500)

## 2019-12-20 LAB — PROCALCITONIN: Procalcitonin: <0.1 ng/mL

## 2019-12-20 LAB — C-REACTIVE PROTEIN: CRP: 18.1 mg/dL — ABNORMAL HIGH (ref <1.0)

## 2019-12-20 LAB — FIBRINOGEN: Fibrinogen: 713 mg/dL — ABNORMAL HIGH (ref 210–475)

## 2019-12-20 LAB — GLUCOSE, CAPILLARY
Glucose-Capillary: 158 mg/dL — ABNORMAL HIGH (ref 70–99)
Glucose-Capillary: 141 mg/dL — ABNORMAL HIGH (ref 70–99)

## 2019-12-20 LAB — SARS CORONAVIRUS 2 (TAT 6-24 HRS): SARS Coronavirus 2: POSITIVE — AB

## 2019-12-20 LAB — SEDIMENTATION RATE: Sed Rate: 92 mm/hr — ABNORMAL HIGH (ref 0–16)

## 2019-12-20 LAB — FERRITIN: Ferritin: 371 ng/mL — ABNORMAL HIGH (ref 24–336)

## 2019-12-20 LAB — D-DIMER, QUANTITATIVE: D-Dimer, Quant: 1.25 ug/mL-FEU — ABNORMAL HIGH (ref 0.00–0.50)

## 2019-12-20 LAB — LACTATE DEHYDROGENASE: LDH: 152 U/L (ref 98–192)

## 2019-12-20 MED ORDER — PRAVASTATIN SODIUM 20 MG PO TABS
10.0000 mg | ORAL_TABLET | Freq: Every day | ORAL | Status: DC
  Administered 2019-12-20 – 2019-12-24 (×5): 10 mg via ORAL
  Filled 2019-12-20 (×6): qty 1

## 2019-12-20 MED ORDER — HYDRALAZINE HCL 25 MG PO TABS
25.0000 mg | ORAL_TABLET | Freq: Three times a day (TID) | ORAL | Status: DC
  Administered 2019-12-20 – 2019-12-25 (×15): 25 mg via ORAL
  Filled 2019-12-20 (×14): qty 1

## 2019-12-20 MED ORDER — HYDROCOD POLST-CPM POLST ER 10-8 MG/5ML PO SUER: 5.0000 mL | Freq: Two times a day (BID) | ORAL | Status: DC | PRN

## 2019-12-20 MED ORDER — SODIUM CHLORIDE 0.9 % IV SOLN: 1000.0000 mL | INTRAVENOUS | Status: DC

## 2019-12-20 MED ORDER — POTASSIUM CHLORIDE 10 MEQ/100ML IV SOLN
10.0000 meq | INTRAVENOUS | Status: AC
  Administered 2019-12-20 (×4): 10 meq via INTRAVENOUS
  Filled 2019-12-20 (×4): qty 100

## 2019-12-20 MED ORDER — POTASSIUM CHLORIDE CRYS ER 20 MEQ PO TBCR
40.0000 meq | EXTENDED_RELEASE_TABLET | Freq: Two times a day (BID) | ORAL | Status: AC
  Administered 2019-12-20 (×2): 40 meq via ORAL
  Filled 2019-12-20 (×2): qty 2

## 2019-12-20 MED ORDER — SODIUM CHLORIDE 0.9 % IV SOLN
1000.0000 mL | INTRAVENOUS | Status: AC
  Administered 2019-12-20: 1000 mL via INTRAVENOUS

## 2019-12-20 MED ORDER — ALBUTEROL SULFATE HFA 108 (90 BASE) MCG/ACT IN AERS: 1.0000 | INHALATION_SPRAY | RESPIRATORY_TRACT | Status: DC | PRN

## 2019-12-20 MED ORDER — VITAMIN D3 25 MCG (1000 UNIT) PO TABS
1000.0000 [IU] | ORAL_TABLET | Freq: Every day | ORAL | Status: DC
  Administered 2019-12-20 – 2019-12-25 (×6): 1000 [IU] via ORAL
  Filled 2019-12-20 (×6): qty 1

## 2019-12-20 MED ORDER — DIPHENHYDRAMINE HCL 12.5 MG/5ML PO ELIX
12.5000 mg | ORAL_SOLUTION | Freq: Two times a day (BID) | ORAL | Status: DC
  Administered 2019-12-20 – 2019-12-25 (×11): 12.5 mg via ORAL
  Filled 2019-12-20 (×11): qty 5

## 2019-12-20 MED ORDER — AMLODIPINE BESYLATE 5 MG PO TABS
5.0000 mg | ORAL_TABLET | Freq: Every day | ORAL | Status: DC
  Administered 2019-12-20 – 2019-12-25 (×6): 5 mg via ORAL
  Filled 2019-12-20 (×6): qty 1

## 2019-12-20 MED ORDER — SERTRALINE HCL 25 MG PO TABS
25.0000 mg | ORAL_TABLET | Freq: Every day | ORAL | Status: DC
  Administered 2019-12-20 – 2019-12-25 (×6): 25 mg via ORAL
  Filled 2019-12-20 (×6): qty 1

## 2019-12-20 MED ORDER — MELATONIN 3 MG PO TABS
6.0000 mg | ORAL_TABLET | Freq: Every day | ORAL | Status: DC
  Administered 2019-12-20 – 2019-12-24 (×5): 6 mg via ORAL
  Filled 2019-12-20 (×5): qty 2

## 2019-12-20 MED ORDER — INSULIN ASPART 100 UNIT/ML ~~LOC~~ SOLN
0.0000 [IU] | Freq: Four times a day (QID) | SUBCUTANEOUS | Status: DC
  Administered 2019-12-20: 19:00:00 2 [IU] via SUBCUTANEOUS
  Administered 2019-12-21 – 2019-12-24 (×5): 1 [IU] via SUBCUTANEOUS

## 2019-12-20 MED ORDER — IPRATROPIUM-ALBUTEROL 20-100 MCG/ACT IN AERS
1.0000 | INHALATION_SPRAY | Freq: Four times a day (QID) | RESPIRATORY_TRACT | Status: DC
  Administered 2019-12-20 – 2019-12-25 (×19): 1 via RESPIRATORY_TRACT
  Filled 2019-12-20: qty 4

## 2019-12-20 MED ORDER — GUAIFENESIN-DM 100-10 MG/5ML PO SYRP: 5.0000 mL | ORAL_SOLUTION | ORAL | Status: DC | PRN

## 2019-12-20 MED ORDER — PANTOPRAZOLE SODIUM 40 MG PO TBEC
40.0000 mg | DELAYED_RELEASE_TABLET | Freq: Every day | ORAL | Status: DC
  Administered 2019-12-20 – 2019-12-25 (×6): 40 mg via ORAL
  Filled 2019-12-20 (×6): qty 1

## 2019-12-20 MED ORDER — SERTRALINE HCL 100 MG PO TABS
100.0000 mg | ORAL_TABLET | Freq: Every day | ORAL | Status: DC
  Administered 2019-12-20 – 2019-12-25 (×6): 100 mg via ORAL
  Filled 2019-12-20 (×6): qty 1

## 2019-12-20 MED ORDER — CLOPIDOGREL BISULFATE 75 MG PO TABS
75.0000 mg | ORAL_TABLET | Freq: Every day | ORAL | Status: DC
  Administered 2019-12-20 – 2019-12-25 (×6): 75 mg via ORAL
  Filled 2019-12-20 (×6): qty 1

## 2019-12-20 MED ORDER — SODIUM CHLORIDE 0.9 % IV SOLN
1.0000 g | INTRAVENOUS | Status: DC
  Administered 2019-12-20 – 2019-12-25 (×6): 1 g via INTRAVENOUS
  Filled 2019-12-20 (×4): qty 1
  Filled 2019-12-20: qty 10
  Filled 2019-12-20: qty 1

## 2019-12-20 NOTE — Progress Notes (Signed)
PROGRESS NOTE    Anthony Hayes  NID:[id number] DOB: 08-15-1935 DOA: 12/19/2019 PCP: Etter Sjogren, DO   Brief Narrative: HPI per Dr. Jani Gravel on 12/19/2019  Anthony Hayes  is a 84 y.o. male,  w hypertension, hyperlipidemia, ckd stage3,  h/o CVA, hypothyroidism, depression, dementia, apparently lives at [facility] and noted to be covid-19 positive, sent to ER for fever, and hypoxia.   In Ed,  T 98.4, P 48, R 21, Bp 123/65  Pox 95% on 3L  CTA chest IMPRESSION: Slightly suboptimal opacification of the main pulmonary artery. No central or segmental pulmonary embolism.  Multifocal patchy/ground-glass opacities throughout both lungs, consistent with COVID pneumonia.     Wbc 11.5, Hgb 11.2, Plt 290 Na 140, K 2.6, Bun 27, Creatinie 1.15 Ast 17, Alt 14,  Alb 3.0 D dimer 1.49 procalcitonin 0.24 LDH 165 Ferritin 307 Fibrinogen 739 crp 14.9,  Trop 33  Urinalysis wbc >50, rbc 0-5  Blood culture x2 pending  Pt will be admitted for hypokalemia, covid-19 infection and acute lower uti.   **Interim History  Inflammatory markers are trending upwards and he was started on treatment for his COVID-19 pneumonia.  Oxygen has been weaning and now on 3 L from 4.  Assessment & Plan:   Active Problems:   Chronic kidney disease, stage III (moderate)   Vitamin B12 deficiency   Hypothyroidism   Dementia without behavioral disturbance (HCC)   Hypokalemia   COVID-19 virus infection   Protein-calorie malnutrition, severe (HCC)  Acute respiratory failure with hypoxia Covid-19 infection  -Admit to Inpatient Telemetry -Patient's chest x-ray showed "Hazy an indistinctly marginated peripheral airspace opacities in the right upper lobe and left mid lung favoring pneumonia over mild Edema. Mild enlargement of the cardiopericardial silhouette." -POC SARS CoV-2 was Negative but SARS CORNOAVIRUS 2 (TAT 6-24) PCR was POSITIVE -On presentation he had a low saturation and does not chronically  wear oxygen -Initial Inflammatory markers were checked and showed an LDH of 165, triglycerides of 107, ferritin level 307, CRP of 14.9, lactic acid level of 1.1, procalcitonin level of 0.24, a D-dimer of 1.49 and a fibrinogen of 739 -Continue to monitor and trend inflammatory markers daily and inflammatory marker trend is as follows: Recent Labs    12/19/19 1900 12/20/19 1106  DDIMER 1.49* 1.25*  FERRITIN 307 371*  LDH 165 152  CRP 14.9* 18.1*  -ESR was not checked initially but is now 92 -PCT was 0.24 Lab Results  Component Value Date   SARSCOV2NAA POSITIVE (A) 12/19/2019  -SpO2: 99 % O2 Flow Rate (L/min): 3 L/min; Was on 4 Liters earlier  -Recommend proning much as possible -We will give the patient dexamethasone 6 mg IV daily for 10 days; Given 10 mg IV x1 yesterday  -Also will start Remdesivir for 5 days total with pharmacy to dose -Start the patient on inhalers with Combivent scheduled and add albuterol inhaler as needed; Given 6 puff of Albuterol InHaler yesterday  -We will give symptomatic treatment and continue with antitussives with Tussionex and Robitussin DM -Blood cultures x2 have been drawn and are still pending to result -Initially PCT was 0.24 but now trended down to <0.10; There was concern for Aspiration PNA so he was given Unasyn -Check SLP Evaluation  -Continue to maintain euvolemia but he remains on the drier side and significantly dehydrated so will give IVF but reduced rate from 125 mL/hr -> 75 mL/hr and will stop later today  -Repeat chest x-ray in a.m. and continue to monitor  patient's clinical response to intervention and follow daily inflammatory marker trend and if not improving may consider Actemra and or addition of convalescent plasma -C/w with Airborne and Contact Precautions -Continuous pulse oximetry and maintain O2 saturations greater than 90% -Continue supplemental oxygen via nasal cannula and wean O2 as tolerated -We will also add zinc and vitamin C  -Repeat CXR in AM   ? Acute Lower UTI, poA -WBC on Admission was 11.5 -> 9.2 -Urinalysis showed clear appearance with amber color, negative glucose, negative hemoglobin, moderate leukocytes, negative nitrites, rare bacteria, 0-5 RBCs per high-power field, and greater than fifty WBCs -Urine Culture is pending -C/w IV Ceftriaxone 1 gram q24h  Hypokalemia -Patient's potassium on admission was 2.6 and slightly improved to 2.8 this morning -Continue to monitor and replete as necessary and will replete with p.o. potassium chloride 40 mg twice daily x2 doses over hours IV KCl 40 mEq -Repeat CMP in AM   Severe Protein Calorie Malnutrition -C/w ProStat 80m po bid  Hypertension -Continue Amlodipine 561mpo qday and Hydralazine 2552mo TID -BP was 142/66 -Continue to Monitor Blood Pressure per Protocol  Hyperlipidemia Hx of TIA -Continue Plavix 19m104m qday and  Pravastatin 10mg32mday  Anxiety/ Dementia -C/w Sertraline 125 mg po Daily   Gerd -Continue PPI with Pantoprazole 40 mg po Daily   Hyperglycemia -In setting of steroid demargination -Started on sensitive NovoLog/scale every 6 -Continue to monitor and trend blood sugars very carefully -Check hemoglobin A1c in a.m. -Adjust insulin regimen as necessary  Metabolic Acidosis -Mild with a CO2 of 20, chloride level of 112 and a anion gap of 10 -Continue with fluid hydration as above and repeat CMP in a.m.  HTN -C/w Amlodipine 5 mg po Daily   GOC: DNR, poA  DVT prophylaxis: Enoxaparin 40 mg subcutaneous q24h Code Status: DO NOT RESUSCITATE  Family Communication: None Disposition Plan: Pending further workup and treatment for his COVID PNA  Consultants:   None   Procedures:  None  Antimicrobials: Anti-infectives (From admission, onward)   Start     Dose/Rate Route Frequency Ordered Stop   12/20/19 1000  remdesivir 100 mg in sodium chloride 0.9 % 100 mL IVPB     100 mg 200 mL/hr over 30 Minutes Intravenous  Daily 12/19/19 2241 12/24/19 0959   12/20/19 0400  cefTRIAXone (ROCEPHIN) 1 g in sodium chloride 0.9 % 100 mL IVPB     1 g 200 mL/hr over 30 Minutes Intravenous Every 24 hours 12/20/19 0022     12/19/19 2330  remdesivir 200 mg in sodium chloride 0.9% 250 mL IVPB     200 mg 580 mL/hr over 30 Minutes Intravenous Once 12/19/19 2241 12/20/19 0117   12/19/19 2015  Ampicillin-Sulbactam (UNASYN) 3 g in sodium chloride 0.9 % 100 mL IVPB     3 g 200 mL/hr over 30 Minutes Intravenous  Once 12/19/19 2002 12/19/19 2220   12/19/19 1945  ceFEPIme (MAXIPIME) 1 g in sodium chloride 0.9 % 100 mL IVPB  Status:  Discontinued     1 g 200 mL/hr over 30 Minutes Intravenous  Once 12/19/19 1934 12/19/19 1938   12/19/19 1945  vancomycin (VANCOREADY) IVPB 1500 mg/300 mL  Status:  Discontinued     1,500 mg 150 mL/hr over 120 Minutes Intravenous STAT 12/19/19 1938 12/19/19 1952   12/19/19 1945  ceFEPIme (MAXIPIME) 2 g in sodium chloride 0.9 % 100 mL IVPB  Status:  Discontinued     2 g 200 mL/hr over 30  Minutes Intravenous  Once 12/19/19 1938 12/19/19 1952     Subjective: Seen and examined at bedside and he is resting.  Nursing states that he took all his pills and has been doing okay.  Denies any leg swelling.  No chest pain, lightheadedness or dizziness but does feel short of breath.  No other concerns or complaints at this time.  Objective: Vitals:   12/20/19 0627 12/20/19 0801 12/20/19 1036 12/20/19 1257  BP: (!) 131/55 132/64 139/66 (!) 142/66  Pulse: (!) 42 (!) 49 (!) 49 (!) 49  Resp: 18 20 20 20   Temp: 98.4 F (36.9 C) 97.8 F (36.6 C) 98.6 F (37 C) 98 F (36.7 C)  TempSrc:  Oral  Oral  SpO2: 100% 98% 97% 99%  Weight:        Intake/Output Summary (Last 24 hours) at 12/20/2019 1518 Last data filed at 12/20/2019 1400 Gross per 24 hour  Intake 3008.23 ml  Output -  Net 3008.23 ml   Filed Weights   12/20/19 0115  Weight: 70.8 kg   Examination: Physical Exam:  Constitutional: WN/WD  elderly Caucasian male currently in NAD and appears calm  Eyes: Lids and conjunctivae normal, sclerae anicteric  ENMT: External Ears, Nose appear normal. Grossly normal hearing.  Neck: Appears normal, supple, no cervical masses, normal ROM, no appreciable thyromegaly; no JVD Respiratory: Diminished to auscultation bilaterally with coarse breath sounds, no wheezing, rales, rhonchi or crackles. Normal respiratory effort and patient is not tachypenic. No accessory muscle use.  Wearing supplemental oxygen via nasal cannula Cardiovascular: RRR, no murmurs / rubs / gallops. S1 and S2 auscultated. Trace extremity edema.  Abdomen: Soft, non-tender, non-distended. Bowel sounds positive.  GU: Deferred.  Has a condom catheter on Musculoskeletal: No clubbing / cyanosis of digits/nails. No joint deformity upper and lower extremities.  Skin: No rashes, lesions, ulcers on a limited skin evaluation. No induration; Warm and dry.  Neurologic: CN 2-12 grossly intact with no focal deficits. Romberg sign and cerebellar reflexes not assessed.  Psychiatric: Impaired judgment and insight. Alert and oriented x 3. Calm mood and appropriate affect.   Data Reviewed: I have personally reviewed following labs and imaging studies  CBC: Recent Labs  Lab 12/19/19 1900 12/20/19 0302  WBC 11.5* 9.2  NEUTROABS 8.7* 8.3*  HGB 11.2* 10.7*  HCT 33.0* 32.4*  MCV 90.4 91.8  PLT 290 948   Basic Metabolic Panel: Recent Labs  Lab 12/19/19 1900 12/20/19 0302  NA 140 142  K 2.6* 2.8*  CL 110 112*  CO2 20* 20*  GLUCOSE 135* 200*  BUN 27* 27*  CREATININE 1.15 1.09  CALCIUM 8.4* 8.7*   GFR: CrCl cannot be calculated (Unknown ideal weight.). Liver Function Tests: Recent Labs  Lab 12/19/19 1900 12/20/19 0302  AST 17 17  ALT 14 14  ALKPHOS 79 77  BILITOT 1.2 0.7  PROT 6.3* 6.3*  ALBUMIN 3.0* 2.7*   No results for input(s): LIPASE, AMYLASE in the last 168 hours. No results for input(s): AMMONIA in the last 168  hours. Coagulation Profile: No results for input(s): INR, PROTIME in the last 168 hours. Cardiac Enzymes: No results for input(s): CKTOTAL, CKMB, CKMBINDEX, TROPONINI in the last 168 hours. BNP (last 3 results) No results for input(s): PROBNP in the last 8760 hours. HbA1C: No results for input(s): HGBA1C in the last 72 hours. CBG: No results for input(s): GLUCAP in the last 168 hours. Lipid Profile: Recent Labs    12/19/19 1900  TRIG 107   Thyroid  Function Tests: Recent Labs    12/20/19 0302  TSH 0.642   Anemia Panel: Recent Labs    12/19/19 1900 12/20/19 1106  FERRITIN 307 371*   Sepsis Labs: Recent Labs  Lab 12/19/19 1900 12/20/19 1106  PROCALCITON 0.24 <0.10  LATICACIDVEN 1.1  --     Recent Results (from the past 240 hour(s))  Blood Culture (routine x 2)     Status: None (Preliminary result)   Collection Time: 12/19/19  7:00 PM   Specimen: BLOOD LEFT HAND  Result Value Ref Range Status   Specimen Description   Final    BLOOD LEFT HAND Performed at [facility], [place] [address] [place], [place] [address]    Special Requests   Final    BOTTLES DRAWN AEROBIC AND ANAEROBIC Blood Culture adequate volume Performed at [facility] [address], [place], [place] [address]    Culture   Final    NO GROWTH < 24 HOURS Performed at [facility] [address] [place], [place] [address]    Report Status PENDING  Incomplete  Blood Culture (routine x 2)     Status: None (Preliminary result)   Collection Time: 12/19/19  7:00 PM   Specimen: BLOOD LEFT FOREARM  Result Value Ref Range Status   Specimen Description   Final    BLOOD LEFT FOREARM Performed at [facility] [address] [place], [place] [address]    Special Requests   Final    BOTTLES DRAWN AEROBIC AND ANAEROBIC Blood Culture adequate volume Performed at [facility] [address] [place], [place] [address]     Culture   Final    NO GROWTH < 24 HOURS Performed at [facility] [address] [place], [place] [address]    Report Status PENDING  Incomplete  SARS CORONAVIRUS 2 (TAT 6-24 HRS) Nasopharyngeal Nasopharyngeal Swab     Status: Abnormal   Collection Time: 12/19/19  9:00 PM   Specimen: Nasopharyngeal Swab  Result Value Ref Range Status   SARS Coronavirus 2 POSITIVE (A) NEGATIVE Final    Comment: RESULT CALLED TO, READ BACK BY AND VERIFIED WITH: H NJANG,RN 0330 12/20/2019 D BRADLEY (NOTE) SARS-CoV-2 target nucleic acids are DETECTED. The SARS-CoV-2 RNA is generally detectable in upper and lower respiratory specimens during the acute phase of infection. Positive results are indicative of the presence of SARS-CoV-2 RNA. Clinical correlation with patient history and other diagnostic information is  necessary to determine patient infection status. Positive results do not rule out bacterial infection or co-infection with other viruses.  The expected result is Negative. Fact Sheet for Patients: SugarRoll.be Fact Sheet for Healthcare Providers: https://www.woods-mathews.com/ This test is not yet approved or cleared by the [place] FDA and  has been authorized for detection and/or diagnosis of SARS-CoV-2 by FDA under an Emergency Use Authorization (EUA). This EUA will remain  in effect (meaning this test can be used) for th e duration of the COVID-19 declaration under Section 564(b)(1) of the Act, 21 U.S.C. section 360bbb-3(b)(1), unless the authorization is terminated or revoked sooner. Performed at [facility], [place] [address], [place], [place] [address]     RN Pressure Injury Documentation: Pressure Injury 04/05/18 Stage I -  Intact skin with non-blanchable redness of a localized area usually over a bony prominence. (Active)  04/05/18 0800  Location: Buttocks  Location Orientation:   Staging: Stage I -  Intact skin with  non-blanchable  redness of a localized area usually over a bony prominence.  Wound Description (Comments):   Present on Admission:      Pressure Injury 12/20/19 Buttocks Left Stage 1 -  Intact skin with non-blanchable redness of a localized area usually over a bony prominence. (Active)  12/20/19 0236  Location: Buttocks  Location Orientation: Left  Staging: Stage 1 -  Intact skin with non-blanchable redness of a localized area usually over a bony prominence.  Wound Description (Comments):   Present on Admission:    Radiology Studies: CT Angio Chest PE W and/or Wo Contrast  Result Date: 12/19/2019 CLINICAL DATA:  Shortness of breath, COVID positive EXAM: CT ANGIOGRAPHY CHEST WITH CONTRAST TECHNIQUE: Multidetector CT imaging of the chest was performed using the standard protocol during bolus administration of intravenous contrast. Multiplanar CT image reconstructions and MIPs were obtained to evaluate the vascular anatomy. CONTRAST:  145m OMNIPAQUE IOHEXOL 350 MG/ML SOLN COMPARISON:  None. FINDINGS: Cardiovascular: There is slightly suboptimal opacification of the main pulmonary artery. However there is no filling defect in the central or segmental pulmonary arteries. There is mild cardiomegaly. No evidence of right ventricular heart strain. No pericardial effusion. There is normal three-vessel brachiocephalic anatomy without proximal stenosis. The thoracic aorta is normal in appearance. Coronary artery calcifications are seen. Mediastinum/Nodes: No hilar, mediastinal, or axillary adenopathy. Thyroid gland, trachea, and esophagus demonstrate no significant findings. Lungs/Pleura: Patchy/ground-glass peripherally based airspace opacities are seen throughout both lungs, predominantly within the right upper lung and lingula. There is also streaky airspace opacity at both lung bases. No pleural effusion or pneumothorax. Upper Abdomen: There is a small hiatal hernia. No acute abnormalities present in the  visualized portions of the upper abdomen. Musculoskeletal: No chest wall abnormality. No acute or significant osseous findings. Review of the MIP images confirms the above findings. IMPRESSION: Slightly suboptimal opacification of the main pulmonary artery. No central or segmental pulmonary embolism. Multifocal patchy/ground-glass opacities throughout both lungs, consistent with COVID pneumonia. Aortic Atherosclerosis (ICD10-I70.0). Electronically Signed   By: BPrudencio PairM.D.   On: 12/19/2019 22:17   DG Chest Port 1 View  Result Date: 12/19/2019 CLINICAL DATA:  Covert pneumonia.  Hypoxia. EXAM: PORTABLE CHEST 1 VIEW COMPARISON:  11/21/2018 FINDINGS: Mild cardiomegaly. Indistinct peripheral airspace opacity noted in the right upper lobe and peripherally in the left mid lung. Suspected mild bibasilar atelectasis. Atherosclerotic calcification of the aortic arch. No pleural effusion is identified. IMPRESSION: 1. Hazy an indistinctly marginated peripheral airspace opacities in the right upper lobe and left mid lung favoring pneumonia over mild edema. 2. Mild enlargement of the cardiopericardial silhouette. Electronically Signed   By: WVan ClinesM.D.   On: 12/19/2019 18:58   Scheduled Meds: . amLODipine  5 mg Oral Daily  . cholecalciferol  1,000 Units Oral Daily  . clopidogrel  75 mg Oral Daily  . dexamethasone (DECADRON) injection  6 mg Intravenous Q24H  . diphenhydrAMINE  12.5 mg Oral BID  . enoxaparin (LOVENOX) injection  40 mg Subcutaneous QHS  . feeding supplement (PRO-STAT SUGAR FREE 64)  30 mL Oral BID  . hydrALAZINE  25 mg Oral TID  . insulin aspart  0-9 Units Subcutaneous Q6H  . Melatonin  6 mg Oral QHS  . pantoprazole  40 mg Oral Daily  . potassium chloride  40 mEq Oral BID  . pravastatin  10 mg Oral q1800  . sertraline  100 mg Oral Daily  . sertraline  25 mg Oral Daily   Continuous Infusions: . sodium chloride  1,000 mL (12/20/19 0424)  . cefTRIAXone (ROCEPHIN)  IV 1 g  (12/20/19 0426)  . remdesivir 100 mg in NS 100 mL 100 mg (12/20/19 1013)    LOS: 1 day   Kerney Elbe, DO [facility] PAGER is on [place]  If 7PM-7AM, please contact night-coverage [email or web address]

## 2019-12-20 NOTE — Progress Notes (Signed)
Manufacturing engineer Palliative  Mr. Narvaez is enrolled in our palliative program in the community.  [facility] will continue to follow while hospitalized and resume care once he is discharged.  Venia Carbon RN, BSN, [facility] Liaison (in [place]) [phone]

## 2019-12-20 NOTE — Progress Notes (Signed)
NP on call made aware of patient 2.19 sec pause with HR of 39, non sustained. Ask to monitor. Patient asymptomatic. Also notified of covid positive test,

## 2019-12-20 NOTE — TOC Initial Note (Signed)
Transition of Care [facility]) - Initial/Assessment Note    Patient Details  Name: Anthony Hayes MRN: [id number] Date of Birth: 1935-10-19  Transition of [facility]) CM/SW Contact:    Wende Neighbors, LCSW Phone Number: 12/20/2019, 3:13 PM  Clinical Narrative:    Patient came from [facility] memory care and was being followed by [facility] care for palliative needs. CSW spoke with Larene Beach  At [facility]. Larene Beach stated they will be able to take patient back once medically stable but facility is requesting an FL2. CSW will continue to follow patient for discharge needs              Expected Discharge Plan: Memory Care Barriers to Discharge: Continued Medical Work up   Patient Goals and CMS Choice        Expected Discharge Plan and Services Expected Discharge Plan: Memory Care In-house Referral: Clinical Social Work     Living arrangements for the past 2 months: [place]                                      Prior Living Arrangements/Services Living arrangements for the past 2 months: [place] Lives with:: Facility Resident, Self Patient language and need for interpreter reviewed:: Yes        Need for Family Participation in Patient Care: Yes (Comment) Care giver support system in place?: Yes (comment)   Criminal Activity/Legal Involvement Pertinent to Current Situation/Hospitalization: No - Comment as needed  Activities of Daily Living Home Assistive Devices/Equipment: Blood pressure cuff, Eyeglasses, Grab bars around toilet, Grab bars in shower, Hand-held shower hose, Walker (specify type), Scales, Wheelchair(rollator-[facility] has necessary equipment for their residents) ADL Screening (condition at time of admission) Patient's cognitive ability adequate to safely complete daily activities?: No(patient has dementia) Is the patient deaf or have difficulty hearing?: No Does the patient have difficulty seeing, even when wearing  glasses/contacts?: No Does the patient have difficulty concentrating, remembering, or making decisions?: Yes Patient able to express need for assistance with ADLs?: No Does the patient have difficulty dressing or bathing?: Yes Independently performs ADLs?: No Communication: Independent Dressing (OT): Needs assistance Is this a change from baseline?: Pre-admission baseline Grooming: Needs assistance Is this a change from baseline?: Pre-admission baseline Feeding: Needs assistance Is this a change from baseline?: Pre-admission baseline Bathing: Needs assistance Is this a change from baseline?: Pre-admission baseline Toileting: Needs assistance Is this a change from baseline?: Pre-admission baseline In/Out Bed: Needs assistance Is this a change from baseline?: Pre-admission baseline Walks in Home: Needs assistance Is this a change from baseline?: Pre-admission baseline Does the patient have difficulty walking or climbing stairs?: Yes(secondary to weakness) Weakness of Legs: Both Weakness of Arms/Hands: Both  Permission Sought/Granted Permission sought to share information with : Case Manager Permission granted to share information with : Yes, Verbal Permission Granted  Share Information with NAME: Marinell Blight     Permission granted to share info w Relationship: daughter  Permission granted to share info w Contact Information: [phone]  Emotional Assessment Appearance:: Appears stated age Attitude/Demeanor/Rapport: Unable to Assess Affect (typically observed): Unable to Assess Orientation: : Oriented to Self Alcohol / Substance Use: Not Applicable Psych Involvement: No (comment)  Admission diagnosis:  Hypoxia [R09.02] Elevated troponin [R77.8] COVID-19 virus infection [U07.1] COVID-19 [U07.1] Patient Active Problem List   Diagnosis Date Noted  . COVID-19 virus infection 12/19/2019  . Protein-calorie malnutrition, severe (  [place]) 12/19/2019  . HCAP (healthcare-associated  pneumonia) 11/22/2018  . Fall 11/22/2018  . Chronic diastolic CHF (congestive heart failure) ([place]) 11/22/2018  . Hypoxia   . Bradycardia 10/28/2018  . Leg swelling 10/28/2018  . Irregular heart beat 09/08/2018  . Sleep disturbance   . Stage 3 chronic kidney disease   . Benign essential HTN   . Hypokalemia   . Hyponatremia   . Infarction of right basal ganglia ([place]) 04/06/2018  . Pressure injury of skin 04/06/2018  . Vitamin B12 deficiency   . Hypothyroidism   . Crohn's disease with complication ([place])   . Benign prostatic hyperplasia   . Acute lower UTI   . Dementia without behavioral disturbance ([place])   . Acute ischemic stroke ([place]) 04/05/2018  . Leukocytosis   . Shortness of breath   . Acute encephalopathy   . CVA (cerebral vascular accident) ([place]) 04/03/2018  . Calculus, kidney 01/21/2018  . Acute blood loss anemia   . Mallory-Weiss syndrome   . Esophageal stricture   . Respiratory failure ([place])   . UGIB (upper gastrointestinal bleed) 10/16/2017  . GERD (gastroesophageal reflux disease) 10/16/2017  . Essential hypertension, benign 10/16/2017  . Chest pain 10/15/2017  . Hypothyroidism (acquired) 06/08/2017  . Chronic kidney disease, stage III (moderate) 03/19/2016  . Dyslipidemia 03/19/2016  . Degeneration of lumbar or lumbosacral intervertebral disc 07/10/2008  . Arthropathy, lower leg 05/23/2004   PCP:  Etter Sjogren, DO Pharmacy:   [facility], [place] [address] Phone: [phone] Fax: [phone]     Social Determinants of Health (SDOH) Interventions    Readmission Risk Interventions No flowsheet data found.

## 2019-12-20 NOTE — Progress Notes (Signed)
Writer spoke with Pts daughter and updated.

## 2019-12-20 NOTE — Progress Notes (Signed)
Since admit from ED, patient is in deep sleep, snoring. Per Ed report, patient is non verbal but on assessment, patient responds to activity. Unable to assess orientation since patient is still asleep. Fall precaution in place. Admission not completed.

## 2019-12-20 NOTE — Progress Notes (Signed)
Spoke with Patient's daughter and updated.

## 2019-12-21 ENCOUNTER — Inpatient Hospital Stay (HOSPITAL_COMMUNITY): Payer: Medicare Other

## 2019-12-21 LAB — URINE CULTURE: Culture: NO GROWTH

## 2019-12-21 LAB — LACTATE DEHYDROGENASE: LDH: 219 U/L — ABNORMAL HIGH (ref 98–192)

## 2019-12-21 LAB — CBC WITH DIFFERENTIAL/PLATELET
Abs Immature Granulocytes: 0.09 10*3/uL — ABNORMAL HIGH (ref 0.00–0.07)
Basophils Absolute: 0 10*3/uL (ref 0.0–0.1)
Basophils Relative: 0 %
Eosinophils Absolute: 0 10*3/uL (ref 0.0–0.5)
Eosinophils Relative: 0 %
HCT: 30.5 % — ABNORMAL LOW (ref 39.0–52.0)
Hemoglobin: 10.1 g/dL — ABNORMAL LOW (ref 13.0–17.0)
Immature Granulocytes: 1 %
Lymphocytes Relative: 6 %
Lymphs Abs: 0.6 10*3/uL — ABNORMAL LOW (ref 0.7–4.0)
MCH: 30.2 pg (ref 26.0–34.0)
MCHC: 33.1 g/dL (ref 30.0–36.0)
MCV: 91.3 fL (ref 80.0–100.0)
Monocytes Absolute: 0.7 10*3/uL (ref 0.1–1.0)
Monocytes Relative: 6 %
Neutro Abs: 9.1 10*3/uL — ABNORMAL HIGH (ref 1.7–7.7)
Neutrophils Relative %: 87 %
Platelets: 267 10*3/uL (ref 150–400)
RBC: 3.34 MIL/uL — ABNORMAL LOW (ref 4.22–5.81)
RDW: 13.4 % (ref 11.5–15.5)
WBC: 10.5 10*3/uL (ref 4.0–10.5)
nRBC: 0 % (ref 0.0–0.2)

## 2019-12-21 LAB — COMPREHENSIVE METABOLIC PANEL
ALT: 17 U/L (ref 0–44)
AST: 22 U/L (ref 15–41)
Albumin: 2.5 g/dL — ABNORMAL LOW (ref 3.5–5.0)
Alkaline Phosphatase: 67 U/L (ref 38–126)
Anion gap: 9 (ref 5–15)
BUN: 31 mg/dL — ABNORMAL HIGH (ref 8–23)
CO2: 19 mmol/L — ABNORMAL LOW (ref 22–32)
Calcium: 9 mg/dL (ref 8.9–10.3)
Chloride: 116 mmol/L — ABNORMAL HIGH (ref 98–111)
Creatinine, Ser: 0.92 mg/dL (ref 0.61–1.24)
GFR calc Af Amer: >60 mL/min (ref >60)
GFR calc non Af Amer: >60 mL/min (ref >60)
Glucose, Bld: 130 mg/dL — ABNORMAL HIGH (ref 70–99)
Potassium: 4.4 mmol/L (ref 3.5–5.1)
Sodium: 144 mmol/L (ref 135–145)
Total Bilirubin: 0.6 mg/dL (ref 0.3–1.2)
Total Protein: 5.8 g/dL — ABNORMAL LOW (ref 6.5–8.1)

## 2019-12-21 LAB — FERRITIN: Ferritin: 382 ng/mL — ABNORMAL HIGH (ref 24–336)

## 2019-12-21 LAB — C-REACTIVE PROTEIN: CRP: 13 mg/dL — ABNORMAL HIGH (ref <1.0)

## 2019-12-21 LAB — PROCALCITONIN: Procalcitonin: <0.1 ng/mL

## 2019-12-21 LAB — PHOSPHORUS: Phosphorus: 2.1 mg/dL — ABNORMAL LOW (ref 2.5–4.6)

## 2019-12-21 LAB — FIBRINOGEN: Fibrinogen: 670 mg/dL — ABNORMAL HIGH (ref 210–475)

## 2019-12-21 LAB — GLUCOSE, CAPILLARY
Glucose-Capillary: 101 mg/dL — ABNORMAL HIGH (ref 70–99)
Glucose-Capillary: 119 mg/dL — ABNORMAL HIGH (ref 70–99)
Glucose-Capillary: 131 mg/dL — ABNORMAL HIGH (ref 70–99)
Glucose-Capillary: 90 mg/dL (ref 70–99)

## 2019-12-21 LAB — D-DIMER, QUANTITATIVE: D-Dimer, Quant: 0.96 ug/mL-FEU — ABNORMAL HIGH (ref 0.00–0.50)

## 2019-12-21 LAB — SEDIMENTATION RATE: Sed Rate: 96 mm/hr — ABNORMAL HIGH (ref 0–16)

## 2019-12-21 LAB — MAGNESIUM: Magnesium: 2.1 mg/dL (ref 1.7–2.4)

## 2019-12-21 MED ORDER — K PHOS MONO-SOD PHOS DI & MONO 155-852-130 MG PO TABS
500.0000 mg | ORAL_TABLET | Freq: Two times a day (BID) | ORAL | Status: AC
  Administered 2019-12-21 (×2): 500 mg via ORAL
  Filled 2019-12-21 (×2): qty 2

## 2019-12-21 NOTE — Evaluation (Signed)
Clinical/Bedside Swallow Evaluation Patient Details  Name: Anthony Hayes MRN: [id number] Date of Birth: [birth year]  Today's Date: 12/21/2019 Time: SLP Start Time (ACUTE ONLY): 0854 SLP Stop Time (ACUTE ONLY): 0939 SLP Time Calculation (min) (ACUTE ONLY): 45 min  Past Medical History:  Past Medical History:  Diagnosis Date  . Arthritis    "joints" (10/15/2017)  . Benign prostatic hyperplasia   . Complication of anesthesia    "last OR in 06/2017 affected him cognitively; hasn't got back to baseline yet" (10/15/2017)  . Dementia ([place])   . Depression   . GERD (gastroesophageal reflux disease)   . High cholesterol   . Hypertension   . Hypothyroidism   . Renal disease   . Renal disorder   . TIA (transient ischemic attack) early 2000s   Past Surgical History:  Past Surgical History:  Procedure Laterality Date  . ABDOMINAL EXPLORATION SURGERY  7/  . APPENDECTOMY    . CATARACT EXTRACTION W/ INTRAOCULAR LENS  IMPLANT, BILATERAL Bilateral   . COLECTOMY  1950s   "thought he had iteilis"  . ESOPHAGOGASTRODUODENOSCOPY N/A 10/22/2017   Procedure: ESOPHAGOGASTRODUODENOSCOPY (EGD);  Surgeon: Gatha Mayer, MD;  Location: [facility] ENDOSCOPY;  Service: Endoscopy;  Laterality: N/A;  . ESOPHAGOGASTRODUODENOSCOPY (EGD) WITH PROPOFOL N/A 10/16/2017   Procedure: ESOPHAGOGASTRODUODENOSCOPY (EGD) WITH PROPOFOL;  Surgeon: Milus Banister, MD;  Location: [facility] ENDOSCOPY;  Service: Endoscopy;  Laterality: N/A;  . FEMUR FRACTURE SURGERY Right 1950s  . FRACTURE SURGERY    . KNEE ARTHROSCOPY Left   . POSTERIOR LUMBAR FUSION    . ROTATOR CUFF REPAIR Left   . TONSILLECTOMY     HPI:  84 yo male adm to [facility] with COVID 19, CVA, TIA, CHF, depression, GERD s/p stricture, UGI bleed.  Pt was seeing hospice/palliative prior to admission.  Concern for aspiration pneumonia present and pt treated with ABX.  Pt's family informed MD that pt was coughing with liquids prior to admission.  Swallow evaluation ordered.    Assessment / Plan / Recommendation Clinical Impression  Pt presents with clinical indications of oropharyngeal dysphagia - and concern for aspiration. He demonstrates immediate throat clearing and delayed coughing with multiple swallows across consistencies despite chin tuck postures.  Pt clinically tolerated thin via tsp better than cup/straw boluses. Concern for pharyngeal retention present also.  SLP set up oral suction and instructed pt to cough/expectorate as able. Cough was only inconsistently effective with pt using oral suction to clear viscous yellow tinged secretions.    Mitigation of aspiration functional goal for this pt with chronic deficits *he reports coughing with intake for a "long time" but states it's worse with foods than drinks. If MBS can be conducted (pt COVID +) this will allow definitive LRD with effective compensations, however if not would rec full liquids, liquids via tsp, medicine with applesauce and free water between meals after mouth care.  Recommend to follow strict reflux precautions also given h/o stricture/hiatal hernia.     Informed RN of recommendations and concerns. SLP Visit Diagnosis: Dysphagia, pharyngoesophageal phase (R13.14)    Aspiration Risk  Moderate aspiration risk;Risk for inadequate nutrition/hydration    Diet Recommendation Thin liquid;Free water protocol after oral care(full liquids via tsp, free water between meals after oral care)   Liquid Administration via: Spoon(x water) Medication Administration: Whole meds with puree Supervision: Staff to assist with self feeding Compensations: Slow rate;Small sips/bites(cue to cough/expectorate or reswallow if clearing throat or reflexively coughing) Postural Changes: Remain upright for at least 30 minutes after po intake;Seated  upright at 90 degrees    Other  Recommendations Oral Care Recommendations: Oral care QID Other Recommendations: Have oral suction available   Follow up Recommendations         Frequency and Duration min 1 x/week  1 week       Prognosis Prognosis for Safe Diet Advancement: Fair Barriers to Reach Goals: Cognitive deficits;Time post onset      Swallow Study   General Date of Onset: 12/21/19 HPI: 84 yo male adm to [facility] with COVID 19, CVA, TIA, CHF, depression, GERD s/p stricture, UGI bleed.  Pt was seeing hospice/palliative prior to admission.  Concern for aspiration pneumonia present and pt treated with ABX.  Pt's family informed MD that pt was coughing with liquids prior to admission.  Swallow evaluation ordered. Diet Prior to this Study: Regular;Thin liquids Temperature Spikes Noted: No Respiratory Status: Nasal cannula History of Recent Intubation: No Behavior/Cognition: Alert;Cooperative;Pleasant mood Oral Cavity Assessment: Within Functional Limits Oral Care Completed by SLP: Yes Oral Cavity - Dentition: Adequate natural dentition Vision: Functional for self-feeding Self-Feeding Abilities: Able to feed self Patient Positioning: Upright in bed Baseline Vocal Quality: Normal Volitional Cough: Strong Volitional Swallow: Able to elicit    Oral/Motor/Sensory Function Overall Oral Motor/Sensory Function: Generalized oral weakness   Ice Chips Ice chips: Not tested   Thin Liquid Thin Liquid: Impaired Presentation: Self Fed;Spoon;Cup;Straw Pharyngeal  Phase Impairments: Throat Clearing - Immediate;Cough - Delayed    Nectar Thick Nectar Thick Liquid: Impaired Presentation: Cup;Spoon;Straw;Self Fed Pharyngeal Phase Impairments: Multiple swallows;Throat Clearing - Delayed;Throat Clearing - Immediate;Cough - Delayed   Honey Thick Honey Thick Liquid: Not tested   Puree Puree: Impaired Presentation: Spoon Oral Phase Impairments: Reduced labial seal Oral Phase Functional Implications: Right anterior spillage Pharyngeal Phase Impairments: Suspected delayed Swallow;Multiple swallows   Solid     Solid: Impaired Presentation: Self Fed Oral Phase  Impairments: Impaired mastication;Reduced lingual movement/coordination Oral Phase Functional Implications: Right anterior spillage Pharyngeal Phase Impairments: Multiple swallows      [name] 12/21/2019,10:31 AM  Kathleen Lime, MS [place] Office [phone]

## 2019-12-21 NOTE — Care Management Important Message (Signed)
Important Message  Patient Details IM Letter given to Gabriel Earing RN Case Manager to present to the Patient                                              Name: Oneil Behney MRN: [id number] Date of Birth: 1935-05-18   Medicare Important Message Given:  Yes     Kerin Salen 12/21/2019, 10:41 AM

## 2019-12-21 NOTE — Progress Notes (Signed)
PROGRESS NOTE    Anthony Hayes  LGX:[id number] DOB: [birth year] DOA: 12/19/2019 PCP: Etter Sjogren, DO   Brief Narrative: HPI per Dr. Jani Gravel on 12/19/2019  Anthony Hayes  is a 84 y.o. male,  w hypertension, hyperlipidemia, ckd stage3,  h/o CVA, hypothyroidism, depression, dementia, apparently lives at [facility] and noted to be covid-19 positive, sent to ER for fever, and hypoxia.   In Ed,  T 98.4, P 48, R 21, Bp 123/65  Pox 95% on 3L  CTA chest IMPRESSION: Slightly suboptimal opacification of the main pulmonary artery. No central or segmental pulmonary embolism.  Multifocal patchy/ground-glass opacities throughout both lungs, consistent with COVID pneumonia.     Wbc 11.5, Hgb 11.2, Plt 290 Na 140, K 2.6, Bun 27, Creatinie 1.15 Ast 17, Alt 14,  Alb 3.0 D dimer 1.49 procalcitonin 0.24 LDH 165 Ferritin 307 Fibrinogen 739 crp 14.9,  Trop 33  Urinalysis wbc >50, rbc 0-5  Blood culture x2 pending  Pt will be admitted for hypokalemia, covid-19 infection and acute lower uti.   **Interim History  Inflammatory markers are trending upwards and he was started on treatment for his COVID-19 pneumonia.  Oxygen has been weaning and now on 3 L from 4.  Some inflammatory markers are trending upwards and will need to continue monitor carefully and chest x-ray continues to show persistent patchy bilateral infiltrates  Assessment & Plan:   Active Problems:   Chronic kidney disease, stage III (moderate)   Vitamin B12 deficiency   Hypothyroidism   Dementia without behavioral disturbance (HCC)   Hypokalemia   COVID-19 virus infection   Protein-calorie malnutrition, severe (HCC)  Acute respiratory failure with hypoxia Covid-19 infection  -Admit to Inpatient Telemetry -Patient's chest x-ray showed "Hazy an indistinctly marginated peripheral airspace opacities in the right upper lobe and left mid lung favoring pneumonia over mild Edema. Mild enlargement of the  cardiopericardial silhouette." -POC SARS CoV-2 was Negative but SARS CORNOAVIRUS 2 (TAT 6-24) PCR was POSITIVE -On presentation he had a low saturation and does not chronically wear oxygen -Initial Inflammatory markers were checked and showed an LDH of 165, triglycerides of 107, ferritin level 307, CRP of 14.9, lactic acid level of 1.1, procalcitonin level of 0.24, a D-dimer of 1.49 and a fibrinogen of 739 -Continue to monitor and trend inflammatory markers daily and inflammatory marker trend is as follows: Recent Labs    12/19/19 1900 12/20/19 1106 12/21/19 0245  DDIMER 1.49* 1.25* 0.96*  FERRITIN 307 371* 382*  LDH 165 152 219*  CRP 14.9* 18.1* 13.0*  -ESR was not checked initially but is now 92 -> 96 -PCT was 0.24 Lab Results  Component Value Date   SARSCOV2NAA POSITIVE (A) 12/19/2019  -SpO2: 96 % O2 Flow Rate (L/min): 2 L/min; Was on 4 Liters earlier  -Recommend proning much as possible -We will give the patient dexamethasone 6 mg IV daily for 10 days;  -Also will start Remdesivir for 5 days total with pharmacy to dose -Start the patient on inhalers with Combivent scheduled and add albuterol inhaler as needed; Given 6 puff of Albuterol InHaler yesterday  -We will give symptomatic treatment and continue with antitussives with Tussionex and Robitussin DM -Blood cultures x2 have been drawn and show no growth to date at 2 days -Initially PCT was 0.24 but now trended down to <0.10; There was concern for Aspiration PNA so he was given Unasyn -Check SLP Evaluation likely aspirating as he reports coughing with intake for a long time.  Patient  needs an MBS ideally however unsure if this can be done as he is Covid positive but the interim they recommend a thin full liquid diet with strict reflux precautions -Continue to maintain euvolemia but he remains on the drier side and significantly dehydrated so will give IVF but reduced rate from 125 mL/hr -> 75 mL/hr and will stop later today    -Repeat chest x-ray in a.m. and continue to monitor patient's clinical response to intervention and follow daily inflammatory marker trend and if not improving may consider Actemra and or addition of convalescent plasma -C/w with Airborne and Contact Precautions -Continuous pulse oximetry and maintain O2 saturations greater than 90% -Continue supplemental oxygen via nasal cannula and wean O2 as tolerated -We will also add zinc and vitamin C -Repeat CXR in AM   ? Acute Lower UTI, poA -WBC on Admission was 11.5 -> 9.2 -> 10.5 -Urinalysis showed clear appearance with amber color, negative glucose, negative hemoglobin, moderate leukocytes, negative nitrites, rare bacteria, 0-5 RBCs per high-power field, and greater than fifty WBCs -Urine Culture showed no growth -C/w IV Ceftriaxone 1 gram q24h  Hypokalemia -Patient's potassium on admission was 2.6 and now improved to 4.4 -Continue to monitor and replete as necessary  -Repeat CMP in AM   Severe Protein Calorie Malnutrition -C/w ProStat 31m po bid  Hypertension -Continue Amlodipine 533mpo qday and Hydralazine 2584mo TID -BP was 140/70 -Continue to Monitor Blood Pressure per Protocol  Hyperlipidemia Hx of TIA -Continue Plavix 95m40m qday and  Pravastatin 10mg86mday  Anxiety/ Dementia -C/w Sertraline 125 mg po Daily   Gerd -Continue PPI with Pantoprazole 40 mg po Daily   Hyperglycemia -In setting of steroid demargination -Started on sensitive NovoLog/scale every 6 -Continue to monitor and trend blood sugars very carefully; CBGs have been ranging from 90-158 -Check hemoglobin A1c in a.m. -Adjust insulin regimen as necessary  Metabolic Acidosis -Mild with a CO2 of 20, chloride level of 112 and a anion gap of 10, now his CO2 is 19, chloride level is 116, and anion gap 9 -IV fluid hydration has now been stopped  GOC: DNR, poA  DVT prophylaxis: Enoxaparin 40 mg subcutaneous q24h Code Status: DO NOT RESUSCITATE   Family Communication: None Disposition Plan: Pending further workup and treatment for his COVID PNA  Consultants:   None   Procedures:  None  Antimicrobials: Anti-infectives (From admission, onward)   Start     Dose/Rate Route Frequency Ordered Stop   12/20/19 1000  remdesivir 100 mg in sodium chloride 0.9 % 100 mL IVPB     100 mg 200 mL/hr over 30 Minutes Intravenous Daily 12/19/19 2241 12/24/19 0959   12/20/19 0400  cefTRIAXone (ROCEPHIN) 1 g in sodium chloride 0.9 % 100 mL IVPB     1 g 200 mL/hr over 30 Minutes Intravenous Every 24 hours 12/20/19 0022     12/19/19 2330  remdesivir 200 mg in sodium chloride 0.9% 250 mL IVPB     200 mg 580 mL/hr over 30 Minutes Intravenous Once 12/19/19 2241 12/20/19 0117   12/19/19 2015  Ampicillin-Sulbactam (UNASYN) 3 g in sodium chloride 0.9 % 100 mL IVPB     3 g 200 mL/hr over 30 Minutes Intravenous  Once 12/19/19 2002 12/19/19 2220   12/19/19 1945  ceFEPIme (MAXIPIME) 1 g in sodium chloride 0.9 % 100 mL IVPB  Status:  Discontinued     1 g 200 mL/hr over 30 Minutes Intravenous  Once 12/19/19 1934 12/19/19 1938  12/19/19 1945  vancomycin (VANCOREADY) IVPB 1500 mg/300 mL  Status:  Discontinued     1,500 mg 150 mL/hr over 120 Minutes Intravenous STAT 12/19/19 1938 12/19/19 1952   12/19/19 1945  ceFEPIme (MAXIPIME) 2 g in sodium chloride 0.9 % 100 mL IVPB  Status:  Discontinued     2 g 200 mL/hr over 30 Minutes Intravenous  Once 12/19/19 1938 12/19/19 1952     Subjective: Seen and examined at bedside and he was resting but he still complained of some shortness of breath and had his nasal cannula outside of his nostrils.  I replaced him and he states that he felt better.  No chest pain, lightheadedness or dizziness.  Still feels fatigued.  No other concerns or plans at this time.  Objective: Vitals:   12/21/19 0109 12/21/19 0434 12/21/19 1239 12/21/19 1735  BP: (!) 134/58 (!) 141/58 140/70   Pulse: (!) 41 (!) 42 (!) 47 (!) 45  Resp: 20  20 16    Temp: 98.1 F (36.7 C) 98.3 F (36.8 C) 99.3 F (37.4 C)   TempSrc: Oral Oral Oral   SpO2: 95% 97% 94% 96%  Weight:      Height:        Intake/Output Summary (Last 24 hours) at 12/21/2019 1922 Last data filed at 12/21/2019 1533 Gross per 24 hour  Intake 1010 ml  Output 700 ml  Net 310 ml   Filed Weights   12/20/19 0115 12/20/19 0801  Weight: 70.8 kg 70.8 kg   Examination: Physical Exam:  Constitutional: WN/WD elderly Caucasian male currently in no acute distress appears calm Eyes: Lids and conjunctivae normal, sclerae anicteric  ENMT: External Ears, Nose appear normal. Grossly normal hearing. Neck: Appears normal, supple, no cervical masses, normal ROM, no appreciable thyromegaly; no JVD Respiratory: Diminished to auscultation bilaterally with coarse breath sounds, no wheezing, rales, rhonchi or crackles. Normal respiratory effort and patient is not tachypenic. No accessory muscle use.  Unlabored breathing but he is wearing supplemental oxygen via nasal cannula Cardiovascular: RRR, no murmurs / rubs / gallops. S1 and S2 auscultated.  1+ extremity edema. Abdomen: Soft, non-tender, non-distended. Bowel sounds positive x4.  GU: Deferred. Musculoskeletal: No clubbing / cyanosis of digits/nails. No joint deformity upper and lower extremities.  Skin: No rashes, lesions, ulcers on a limited skin evaluation. No induration; Warm and dry.  Neurologic: CN 2-12 grossly intact with no focal deficits. Romberg sign and cerebellar reflexes not assessed.  Psychiatric: Impaired judgment and insight. Alert and oriented x 3. Normal mood and appropriate affect.  Data Reviewed: I have personally reviewed following labs and imaging studies  CBC: Recent Labs  Lab 12/19/19 1900 12/20/19 0302 12/21/19 0245  WBC 11.5* 9.2 10.5  NEUTROABS 8.7* 8.3* 9.1*  HGB 11.2* 10.7* 10.1*  HCT 33.0* 32.4* 30.5*  MCV 90.4 91.8 91.3  PLT 290 256 010   Basic Metabolic Panel: Recent Labs  Lab  12/19/19 1900 12/20/19 0302 12/20/19 1106 12/21/19 0245  NA 140 142 143 144  K 2.6* 2.8* 3.0* 4.4  CL 110 112* 115* 116*  CO2 20* 20* 19* 19*  GLUCOSE 135* 200* 142* 130*  BUN 27* 27* 27* 31*  CREATININE 1.15 1.09 0.98 0.92  CALCIUM 8.4* 8.7* 8.8* 9.0  MG  --   --   --  2.1  PHOS  --   --   --  2.1*   GFR: Estimated Creatinine Clearance: 55.9 mL/min (by C-G formula based on SCr of 0.92 mg/dL). Liver Function Tests:  Recent Labs  Lab 12/19/19 1900 12/20/19 0302 12/20/19 1106 12/21/19 0245  AST 17 17 19 22   ALT 14 14 14 17   ALKPHOS 79 77 72 67  BILITOT 1.2 0.7 0.5 0.6  PROT 6.3* 6.3* 6.3* 5.8*  ALBUMIN 3.0* 2.7* 2.8* 2.5*   No results for input(s): LIPASE, AMYLASE in the last 168 hours. No results for input(s): AMMONIA in the last 168 hours. Coagulation Profile: No results for input(s): INR, PROTIME in the last 168 hours. Cardiac Enzymes: No results for input(s): CKTOTAL, CKMB, CKMBINDEX, TROPONINI in the last 168 hours. BNP (last 3 results) No results for input(s): PROBNP in the last 8760 hours. HbA1C: No results for input(s): HGBA1C in the last 72 hours. CBG: Recent Labs  Lab 12/20/19 1852 12/20/19 2304 12/21/19 0636 12/21/19 1217 12/21/19 1728  GLUCAP 158* 141* 119* 90 101*   Lipid Profile: Recent Labs    12/19/19 1900  TRIG 107   Thyroid Function Tests: Recent Labs    12/20/19 0302  TSH 0.642   Anemia Panel: Recent Labs    12/20/19 1106 12/21/19 0245  FERRITIN 371* 382*   Sepsis Labs: Recent Labs  Lab 12/19/19 1900 12/20/19 1106 12/21/19 0245  PROCALCITON 0.24 <0.10 <0.10  LATICACIDVEN 1.1  --   --     Recent Results (from the past 240 hour(s))  Blood Culture (routine x 2)     Status: None (Preliminary result)   Collection Time: 12/19/19  7:00 PM   Specimen: BLOOD LEFT HAND  Result Value Ref Range Status   Specimen Description   Final    BLOOD LEFT HAND Performed at [facility], [place] [address]  [place], [place] [address]    Special Requests   Final    BOTTLES DRAWN AEROBIC AND ANAEROBIC Blood Culture adequate volume Performed at [facility] [address], [place], [place] [address]    Culture   Final    NO GROWTH 2 DAYS Performed at [facility] [address] [place], [place] [address]    Report Status PENDING  Incomplete  Blood Culture (routine x 2)     Status: None (Preliminary result)   Collection Time: 12/19/19  7:00 PM   Specimen: BLOOD LEFT FOREARM  Result Value Ref Range Status   Specimen Description   Final    BLOOD LEFT FOREARM Performed at [facility] [address], [place], [place] [address]    Special Requests   Final    BOTTLES DRAWN AEROBIC AND ANAEROBIC Blood Culture adequate volume Performed at [facility] [address], [place], [place] [address]    Culture   Final    NO GROWTH 2 DAYS Performed at [facility] [address] [place], [place] [address]    Report Status PENDING  Incomplete  Urine Culture     Status: None   Collection Time: 12/19/19  7:25 PM   Specimen: Urine, Random  Result Value Ref Range Status   Specimen Description   Final    URINE, RANDOM Performed at [facility] [address] [place], [place] [address]    Special Requests   Final    NONE Performed at [facility], [place] [address] [place],  [address]    Culture   Final    NO GROWTH Performed at [facility], [place] [address] [place],  [address]    Report Status 12/21/2019 FINAL  Final  SARS CORONAVIRUS 2 (TAT 6-24 HRS) Nasopharyngeal Nasopharyngeal Swab     Status: Abnormal   Collection Time: 12/19/19  9:00 PM   Specimen: Nasopharyngeal Swab  Result Value Ref Range Status   SARS Coronavirus 2 POSITIVE (A) NEGATIVE Final    Comment: RESULT CALLED TO, READ BACK BY AND VERIFIED WITH: H NJANG,RN 0330 12/20/2019 D  BRADLEY (NOTE) SARS-CoV-2 target nucleic acids are DETECTED. The SARS-CoV-2 RNA is generally detectable in upper and lower respiratory specimens during the acute phase of infection. Positive results are indicative of the presence of SARS-CoV-2 RNA. Clinical correlation with patient history and other diagnostic information is  necessary to determine patient infection status. Positive results do not rule out bacterial infection or co-infection with other viruses.  The expected result is Negative. Fact Sheet for Patients: SugarRoll.be Fact Sheet for Healthcare Providers: https://www.woods-mathews.com/ This test is not yet approved or cleared by the [place] FDA and  has been authorized for detection and/or diagnosis of SARS-CoV-2 by FDA under an Emergency Use Authorization (EUA). This EUA will remain  in effect (meaning this test can be used) for th e duration of the COVID-19 declaration under Section 564(b)(1) of the Act, 21 U.S.C. section 360bbb-3(b)(1), unless the authorization is terminated or revoked sooner. Performed at [facility], [place] [address], [place], [place] [address]     RN Pressure Injury Documentation: Pressure Injury 04/05/18 Stage I -  Intact skin with non-blanchable redness of a localized area usually over a bony prominence. (Active)  04/05/18 0800  Location: Buttocks  Location Orientation:   Staging: Stage I -  Intact skin with non-blanchable redness of a localized area usually over a bony prominence.  Wound Description (Comments):   Present on Admission:      Pressure Injury 12/20/19 Buttocks Left Stage 1 -  Intact skin with non-blanchable redness of a localized area usually over a bony prominence. (Active)  12/20/19 0236  Location: Buttocks  Location Orientation: Left  Staging: Stage 1 -  Intact skin with non-blanchable redness of a localized area usually over a bony prominence.  Wound Description  (Comments):   Present on Admission:    Radiology Studies: CT Angio Chest PE W and/or Wo Contrast  Result Date: 12/19/2019 CLINICAL DATA:  Shortness of breath, COVID positive EXAM: CT ANGIOGRAPHY CHEST WITH CONTRAST TECHNIQUE: Multidetector CT imaging of the chest was performed using the standard protocol during bolus administration of intravenous contrast. Multiplanar CT image reconstructions and MIPs were obtained to evaluate the vascular anatomy. CONTRAST:  155m OMNIPAQUE IOHEXOL 350 MG/ML SOLN COMPARISON:  None. FINDINGS: Cardiovascular: There is slightly suboptimal opacification of the main pulmonary artery. However there is no filling defect in the central or segmental pulmonary arteries. There is mild cardiomegaly. No evidence of right ventricular heart strain. No pericardial effusion. There is normal three-vessel brachiocephalic anatomy without proximal stenosis. The thoracic aorta is normal in appearance. Coronary artery calcifications are seen. Mediastinum/Nodes: No hilar, mediastinal, or axillary adenopathy. Thyroid gland, trachea, and esophagus demonstrate no significant findings. Lungs/Pleura: Patchy/ground-glass peripherally based airspace opacities are seen throughout both lungs, predominantly within the right upper lung and lingula. There is also streaky airspace opacity at both lung bases. No pleural effusion or pneumothorax. Upper Abdomen: There is a small hiatal hernia. No acute abnormalities present in the visualized portions of the upper abdomen. Musculoskeletal: No chest wall abnormality. No acute or significant osseous findings. Review of the MIP images confirms the above findings. IMPRESSION: Slightly suboptimal opacification of the main pulmonary artery.  No central or segmental pulmonary embolism. Multifocal patchy/ground-glass opacities throughout both lungs, consistent with COVID pneumonia. Aortic Atherosclerosis (ICD10-I70.0). Electronically Signed   By: Prudencio Pair M.D.   On:  12/19/2019 22:17   DG CHEST PORT 1 VIEW  Result Date: 12/21/2019 CLINICAL DATA:  Shortness of breath. EXAM: PORTABLE CHEST 1 VIEW COMPARISON:  12/19/2019 FINDINGS: The heart is mildly enlarged but stable. Stable tortuosity, ectasia and calcification of the thoracic aorta. Persistent patchy bilateral hazy infiltrates consistent with COVID pneumonia. No pleural effusions. IMPRESSION: Persistent patchy bilateral lung infiltrates. Electronically Signed   By: Marijo Sanes M.D.   On: 12/21/2019 07:28   Scheduled Meds:  amLODipine  5 mg Oral Daily   cholecalciferol  1,000 Units Oral Daily   clopidogrel  75 mg Oral Daily   dexamethasone (DECADRON) injection  6 mg Intravenous Q24H   diphenhydrAMINE  12.5 mg Oral BID   enoxaparin (LOVENOX) injection  40 mg Subcutaneous QHS   feeding supplement (PRO-STAT SUGAR FREE 64)  30 mL Oral BID   hydrALAZINE  25 mg Oral TID   insulin aspart  0-9 Units Subcutaneous Q6H   Ipratropium-Albuterol  1 puff Inhalation Q6H   Melatonin  6 mg Oral QHS   pantoprazole  40 mg Oral Daily   phosphorus  500 mg Oral BID   pravastatin  10 mg Oral q1800   sertraline  100 mg Oral Daily   sertraline  25 mg Oral Daily   Continuous Infusions:  cefTRIAXone (ROCEPHIN)  IV 1 g (12/21/19 0431)   remdesivir 100 mg in NS 100 mL 100 mg (12/21/19 0823)    LOS: 2 days   Kerney Elbe, DO [facility] PAGER is on AMION  If 7PM-7AM, please contact night-coverage [email or web address]

## 2019-12-22 ENCOUNTER — Inpatient Hospital Stay (HOSPITAL_COMMUNITY): Payer: Medicare Other

## 2019-12-22 LAB — CBC WITH DIFFERENTIAL/PLATELET
Abs Immature Granulocytes: 0.07 10*3/uL (ref 0.00–0.07)
Basophils Absolute: 0 10*3/uL (ref 0.0–0.1)
Basophils Relative: 0 %
Eosinophils Absolute: 0 10*3/uL (ref 0.0–0.5)
Eosinophils Relative: 0 %
HCT: 31.3 % — ABNORMAL LOW (ref 39.0–52.0)
Hemoglobin: 10.3 g/dL — ABNORMAL LOW (ref 13.0–17.0)
Immature Granulocytes: 1 %
Lymphocytes Relative: 8 %
Lymphs Abs: 0.7 10*3/uL (ref 0.7–4.0)
MCH: 29.9 pg (ref 26.0–34.0)
MCHC: 32.9 g/dL (ref 30.0–36.0)
MCV: 91 fL (ref 80.0–100.0)
Monocytes Absolute: 0.5 10*3/uL (ref 0.1–1.0)
Monocytes Relative: 6 %
Neutro Abs: 7.1 10*3/uL (ref 1.7–7.7)
Neutrophils Relative %: 85 %
Platelets: 319 10*3/uL (ref 150–400)
RBC: 3.44 MIL/uL — ABNORMAL LOW (ref 4.22–5.81)
RDW: 13.8 % (ref 11.5–15.5)
WBC: 8.4 10*3/uL (ref 4.0–10.5)
nRBC: 0 % (ref 0.0–0.2)

## 2019-12-22 LAB — COMPREHENSIVE METABOLIC PANEL
ALT: 18 U/L (ref 0–44)
AST: 22 U/L (ref 15–41)
Albumin: 2.7 g/dL — ABNORMAL LOW (ref 3.5–5.0)
Alkaline Phosphatase: 68 U/L (ref 38–126)
Anion gap: 12 (ref 5–15)
BUN: 31 mg/dL — ABNORMAL HIGH (ref 8–23)
CO2: 18 mmol/L — ABNORMAL LOW (ref 22–32)
Calcium: 8.8 mg/dL — ABNORMAL LOW (ref 8.9–10.3)
Chloride: 111 mmol/L (ref 98–111)
Creatinine, Ser: 0.95 mg/dL (ref 0.61–1.24)
GFR calc Af Amer: >60 mL/min (ref >60)
GFR calc non Af Amer: >60 mL/min (ref >60)
Glucose, Bld: 101 mg/dL — ABNORMAL HIGH (ref 70–99)
Potassium: 3.7 mmol/L (ref 3.5–5.1)
Sodium: 141 mmol/L (ref 135–145)
Total Bilirubin: 0.6 mg/dL (ref 0.3–1.2)
Total Protein: 6 g/dL — ABNORMAL LOW (ref 6.5–8.1)

## 2019-12-22 LAB — GLUCOSE, CAPILLARY
Glucose-Capillary: 121 mg/dL — ABNORMAL HIGH (ref 70–99)
Glucose-Capillary: 99 mg/dL (ref 70–99)

## 2019-12-22 LAB — C-REACTIVE PROTEIN: CRP: 6.9 mg/dL — ABNORMAL HIGH (ref <1.0)

## 2019-12-22 LAB — FERRITIN: Ferritin: 366 ng/mL — ABNORMAL HIGH (ref 24–336)

## 2019-12-22 LAB — SEDIMENTATION RATE: Sed Rate: 79 mm/hr — ABNORMAL HIGH (ref 0–16)

## 2019-12-22 LAB — FIBRINOGEN: Fibrinogen: 602 mg/dL — ABNORMAL HIGH (ref 210–475)

## 2019-12-22 LAB — PROCALCITONIN: Procalcitonin: <0.1 ng/mL

## 2019-12-22 LAB — LACTATE DEHYDROGENASE: LDH: 157 U/L (ref 98–192)

## 2019-12-22 LAB — MAGNESIUM: Magnesium: 1.9 mg/dL (ref 1.7–2.4)

## 2019-12-22 LAB — PHOSPHORUS: Phosphorus: 3.3 mg/dL (ref 2.5–4.6)

## 2019-12-22 LAB — D-DIMER, QUANTITATIVE: D-Dimer, Quant: 0.7 ug/mL-FEU — ABNORMAL HIGH (ref 0.00–0.50)

## 2019-12-22 NOTE — Plan of Care (Signed)
  Problem: Nutrition: Goal: Adequate nutrition will be maintained Outcome: Progressing   Problem: Elimination: Goal: Will not experience complications related to urinary retention Outcome: Progressing   Problem: Pain Managment: Goal: General experience of comfort will improve Outcome: Progressing   Problem: Skin Integrity: Goal: Risk for impaired skin integrity will decrease Outcome: Progressing   Problem: Safety: Goal: Ability to remain free from injury will improve Outcome: Progressing

## 2019-12-22 NOTE — Progress Notes (Signed)
Writer spoke with the family and updated. Family requesting to speak with the case manager regarding discharge planning.

## 2019-12-22 NOTE — Progress Notes (Signed)
PROGRESS NOTE  Anthony Hayes  DOB: Nov 12, 1935  PCP: Etter Sjogren, DO HRC:[id number]  DOA: 12/19/2019  LOS: 3 days   Chief Complaint  Patient presents with  . COVID POSITIVE   Brief narrative: [name] a84 y.o.male with PMH of hypertension, hyperlipidemia, ckd stage3, h/o CVA, hypothyroidism, depression, dementia, who lives at [facility] memory care sent to ER for fever, and hypoxia.   In ED, patient required 3 L via nasal cannula to maintain oxygen saturation at 95%  CTA chest showed multifocal patchy/ground-glass opacities throughout both lungs, consistent with COVID pneumonia.  WBC count 11.5, procalcitonin 0.24,  COVID-19 PCR positive Urinalysis showed amber-colored urine with leukocytes. Patient was admitted to hospitalist medicine service for further evaluation management  Subjective: Patient was seen and examined this morning.  Elderly Caucasian male.  Lying down in bed.  Alert, awake, not oriented.  Calm and cooperative  Assessment/Plan:. COVID pneumonia Acute respiratory failure with hypoxia -Presented with fever, hypoxia -CTA chest showed multifocal groundglass opacities throughout both lungs -Treatment: Currently on IV Decadron 6 mg daily for 10 days and IV remdesivir for 5 days to end on 12/26. -Supportive care: Vitamin C, Zinc, inhalers, Tylenol, Antitussives -benzonatate, Mucinex -Oxygen - SpO2: 93 % O2 Flow Rate (L/min): 1 L/min -Labs and biomarker trend as below.  All biomarkers improving. -Chest x-ray this morning shows stable to mildly regressed pneumonia  Lab Results  Component Value Date   SARSCOV2NAA POSITIVE (A) 12/19/2019    Recent Labs  Lab 12/19/19 1900 12/20/19 0302 12/21/19 0245 12/22/19 0347  WBC 11.5* 9.2 10.5 8.4   Recent Labs    12/20/19 1106 12/21/19 0245 12/22/19 0347  DDIMER 1.25* 0.96* 0.70*  FERRITIN 371* 382* 366*  LDH 152 219* 157  CRP 18.1* 13.0* 6.9*   Aspiration pneumonia -Initial procalcitonin level  was elevated at 0.24.   -Aspiration pneumonia was suspected.  IV Unasyn was started. -Speech therapy evaluation was obtained.  Moderate risk of aspiration.  Recommended aspiration precautions with thin liquids. -I noticed that patient is only on full liquids at this time.  According to patient's daughter Ms. Whitney who is also a speech therapist, was on regular diet prior to this hospitalization.  Acute Lower UTI, poA -WBC on Admission was 11.5 -> 9.2 -> 10.5 -Urinalysis showed clear appearance with amber color, negative glucose, negative hemoglobin, moderate leukocytes, negative nitrites, rare bacteria, 0-5 RBCs per high-power field, and greater than fifty WBCs -Urine Culture showed no growth -Currently on IV Rocephin.  Hypokalemia -Patient's potassium on admission was 2.6, improved with replacement.   Severe Protein Calorie Malnutrition -C/w ProStat 5m po bid  Hypertension -Continue Amlodipine 580mpo qday and Hydralazine 2531mo TID -BP was 140/70 -Continue to Monitor Blood Pressure per Protocol   Sinus bradycardia -Patient has chronic bradycardia, asymptomatic.  Hyperlipidemia Hx of TIA -Continue Plavix 48m3m qday and  Pravastatin 10mg11mday  Anxiety/ Dementia -C/w Sertraline 125 mg po Daily   Gerd -Continue PPI with Pantoprazole 40 mg po daily   Hyperglycemia -In setting of steroid demargination -Started on sensitive NovoLog/scale every 6 -Continue to monitor and trend blood sugars very carefully; CBGs have been ranging from 90-158 -Check hemoglobin A1c in a.m. -Adjust insulin regimen as necessary  Impaired mobility -Per family, at baseline, patient is 1 assist for transfer.  He is in a memory unit of an ALF.  Family would prefer to get him back there and not to SNF.  Mobility: PT eval ordered Diet: Currently on full  liquid diet only. ?advancement Fluid: None DVT prophylaxis:  Lovenox subcu Code Status:  DNR/DNI Family Communication:  I called and  updated 2 of patient's daughters this afternoon. Expected Discharge:  To complete remdesivir on 12/26.  Hopefully back to SNF after that.  Pending PT/OT eval  Consultants:  None  Procedures:  None  Antimicrobials: Anti-infectives (From admission, onward)   Start     Dose/Rate Route Frequency Ordered Stop   12/20/19 1000  remdesivir 100 mg in sodium chloride 0.9 % 100 mL IVPB     100 mg 200 mL/hr over 30 Minutes Intravenous Daily 12/19/19 2241 12/24/19 0959   12/20/19 0400  cefTRIAXone (ROCEPHIN) 1 g in sodium chloride 0.9 % 100 mL IVPB     1 g 200 mL/hr over 30 Minutes Intravenous Every 24 hours 12/20/19 0022     12/19/19 2330  remdesivir 200 mg in sodium chloride 0.9% 250 mL IVPB     200 mg 580 mL/hr over 30 Minutes Intravenous Once 12/19/19 2241 12/20/19 0117   12/19/19 2015  Ampicillin-Sulbactam (UNASYN) 3 g in sodium chloride 0.9 % 100 mL IVPB     3 g 200 mL/hr over 30 Minutes Intravenous  Once 12/19/19 2002 12/19/19 2220   12/19/19 1945  ceFEPIme (MAXIPIME) 1 g in sodium chloride 0.9 % 100 mL IVPB  Status:  Discontinued     1 g 200 mL/hr over 30 Minutes Intravenous  Once 12/19/19 1934 12/19/19 1938   12/19/19 1945  vancomycin (VANCOREADY) IVPB 1500 mg/300 mL  Status:  Discontinued     1,500 mg 150 mL/hr over 120 Minutes Intravenous STAT 12/19/19 1938 12/19/19 1952   12/19/19 1945  ceFEPIme (MAXIPIME) 2 g in sodium chloride 0.9 % 100 mL IVPB  Status:  Discontinued     2 g 200 mL/hr over 30 Minutes Intravenous  Once 12/19/19 1938 12/19/19 1952        Code Status: DNR   Diet Order            Diet full liquid Room service appropriate? Yes; Fluid consistency: Thin  Diet effective now              Infusions:  . cefTRIAXone (ROCEPHIN)  IV 1 g (12/22/19 0455)  . remdesivir 100 mg in NS 100 mL 100 mg (12/22/19 0916)    Scheduled Meds: . amLODipine  5 mg Oral Daily  . cholecalciferol  1,000 Units Oral Daily  . clopidogrel  75 mg Oral Daily  . dexamethasone  (DECADRON) injection  6 mg Intravenous Q24H  . diphenhydrAMINE  12.5 mg Oral BID  . enoxaparin (LOVENOX) injection  40 mg Subcutaneous QHS  . feeding supplement (PRO-STAT SUGAR FREE 64)  30 mL Oral BID  . hydrALAZINE  25 mg Oral TID  . insulin aspart  0-9 Units Subcutaneous Q6H  . Ipratropium-Albuterol  1 puff Inhalation Q6H  . Melatonin  6 mg Oral QHS  . pantoprazole  40 mg Oral Daily  . pravastatin  10 mg Oral q1800  . sertraline  100 mg Oral Daily  . sertraline  25 mg Oral Daily    PRN meds: acetaminophen, albuterol, chlorpheniramine-HYDROcodone, guaiFENesin-dextromethorphan   Objective: Vitals:   12/22/19 0905 12/22/19 1226  BP: (!) 150/74 138/60  Pulse: (!) 41 (!) 41  Resp: 19 18  Temp: 97.8 F (36.6 C) 97.8 F (36.6 C)  SpO2: 94% 93%    Intake/Output Summary (Last 24 hours) at 12/22/2019 1623 Last data filed at 12/22/2019 1400 Gross per 24 hour  Intake 660 ml  Output 725 ml  Net -65 ml   Filed Weights   12/20/19 0115 12/20/19 0801  Weight: 70.8 kg 70.8 kg   Weight change:  Body mass index is 25.19 kg/m.   Physical Exam: General exam: Appears calm and comfortable.  Skin: No rashes, lesions or ulcers. HEENT: Atraumatic, normocephalic, supple neck, no obvious bleeding Lungs: Clear to auscultation bilaterally CVS: Regular rate and rhythm, no murmur GI/Abd soft, nontender, nondistended, bowel sound present CNS: Alert, awake, cooperative, not oriented to place, person or time at the time of my evaluation Psychiatry: Mood appropriate Extremities: No pedal edema, no calf tenderness  Data Review: I have personally reviewed the laboratory data and studies available.  Recent Labs  Lab 12/19/19 1900 12/20/19 0302 12/21/19 0245 12/22/19 0347  WBC 11.5* 9.2 10.5 8.4  NEUTROABS 8.7* 8.3* 9.1* 7.1  HGB 11.2* 10.7* 10.1* 10.3*  HCT 33.0* 32.4* 30.5* 31.3*  MCV 90.4 91.8 91.3 91.0  PLT 290 256 267 319   Recent Labs  Lab 12/19/19 1900 12/20/19 0302  12/20/19 1106 12/21/19 0245 12/22/19 0347  NA 140 142 143 144 141  K 2.6* 2.8* 3.0* 4.4 3.7  CL 110 112* 115* 116* 111  CO2 20* 20* 19* 19* 18*  GLUCOSE 135* 200* 142* 130* 101*  BUN 27* 27* 27* 31* 31*  CREATININE 1.15 1.09 0.98 0.92 0.95  CALCIUM 8.4* 8.7* 8.8* 9.0 8.8*  MG  --   --   --  2.1 1.9  PHOS  --   --   --  2.1* 3.3    Terrilee Croak, MD  [facility] 12/22/2019

## 2019-12-23 LAB — COMPREHENSIVE METABOLIC PANEL
ALT: 32 U/L (ref 0–44)
AST: 37 U/L (ref 15–41)
Albumin: 2.6 g/dL — ABNORMAL LOW (ref 3.5–5.0)
Alkaline Phosphatase: 69 U/L (ref 38–126)
Anion gap: 12 (ref 5–15)
BUN: 26 mg/dL — ABNORMAL HIGH (ref 8–23)
CO2: 23 mmol/L (ref 22–32)
Calcium: 8.7 mg/dL — ABNORMAL LOW (ref 8.9–10.3)
Chloride: 104 mmol/L (ref 98–111)
Creatinine, Ser: 0.8 mg/dL (ref 0.61–1.24)
GFR calc Af Amer: >60 mL/min (ref >60)
GFR calc non Af Amer: >60 mL/min (ref >60)
Glucose, Bld: 129 mg/dL — ABNORMAL HIGH (ref 70–99)
Potassium: 3.9 mmol/L (ref 3.5–5.1)
Sodium: 139 mmol/L (ref 135–145)
Total Bilirubin: 0.5 mg/dL (ref 0.3–1.2)
Total Protein: 6 g/dL — ABNORMAL LOW (ref 6.5–8.1)

## 2019-12-23 LAB — CBC WITH DIFFERENTIAL/PLATELET
Abs Immature Granulocytes: 0.09 10*3/uL — ABNORMAL HIGH (ref 0.00–0.07)
Basophils Absolute: 0 10*3/uL (ref 0.0–0.1)
Basophils Relative: 0 %
Eosinophils Absolute: 0 10*3/uL (ref 0.0–0.5)
Eosinophils Relative: 0 %
HCT: 33.6 % — ABNORMAL LOW (ref 39.0–52.0)
Hemoglobin: 11 g/dL — ABNORMAL LOW (ref 13.0–17.0)
Immature Granulocytes: 1 %
Lymphocytes Relative: 9 %
Lymphs Abs: 0.6 10*3/uL — ABNORMAL LOW (ref 0.7–4.0)
MCH: 29.8 pg (ref 26.0–34.0)
MCHC: 32.7 g/dL (ref 30.0–36.0)
MCV: 91.1 fL (ref 80.0–100.0)
Monocytes Absolute: 0.3 10*3/uL (ref 0.1–1.0)
Monocytes Relative: 5 %
Neutro Abs: 5.4 10*3/uL (ref 1.7–7.7)
Neutrophils Relative %: 85 %
Platelets: 317 10*3/uL (ref 150–400)
RBC: 3.69 MIL/uL — ABNORMAL LOW (ref 4.22–5.81)
RDW: 13.6 % (ref 11.5–15.5)
WBC: 6.4 10*3/uL (ref 4.0–10.5)
nRBC: 0 % (ref 0.0–0.2)

## 2019-12-23 LAB — HEMOGLOBIN A1C
Hgb A1c MFr Bld: 5.4 % (ref 4.8–5.6)
Mean Plasma Glucose: 108.28 mg/dL

## 2019-12-23 LAB — GLUCOSE, CAPILLARY
Glucose-Capillary: 104 mg/dL — ABNORMAL HIGH (ref 70–99)
Glucose-Capillary: 106 mg/dL — ABNORMAL HIGH (ref 70–99)
Glucose-Capillary: 110 mg/dL — ABNORMAL HIGH (ref 70–99)
Glucose-Capillary: 139 mg/dL — ABNORMAL HIGH (ref 70–99)
Glucose-Capillary: 170 mg/dL — ABNORMAL HIGH (ref 70–99)

## 2019-12-23 LAB — D-DIMER, QUANTITATIVE: D-Dimer, Quant: 0.51 ug/mL-FEU — ABNORMAL HIGH (ref 0.00–0.50)

## 2019-12-23 LAB — C-REACTIVE PROTEIN: CRP: 3.8 mg/dL — ABNORMAL HIGH (ref <1.0)

## 2019-12-23 LAB — FIBRINOGEN: Fibrinogen: 612 mg/dL — ABNORMAL HIGH (ref 210–475)

## 2019-12-23 LAB — SEDIMENTATION RATE: Sed Rate: 75 mm/hr — ABNORMAL HIGH (ref 0–16)

## 2019-12-23 LAB — LACTATE DEHYDROGENASE: LDH: 148 U/L (ref 98–192)

## 2019-12-23 LAB — FERRITIN: Ferritin: 212 ng/mL (ref 24–336)

## 2019-12-23 NOTE — Evaluation (Addendum)
Physical Therapy Evaluation Patient Details Name: Anthony Hayes MRN: [id number] DOB: 1935/10/01 Today's Date: 12/23/2019   History of Present Illness  Pt admitted with Covid+ PNA and with hx of dementia, TIA, colectomy, lumbar fusion, L RCR and CKD  Clinical Impression  Pt admitted as above and presenting with functional mobility limitations 2* generalized weakness, balance deficits, poor safety awareness and cognitive deficits related to dementia.  Pt's daughter hopeful that pt can progress to return to previous memory care unit.    Follow Up Recommendations SNF    Equipment Recommendations  None recommended by PT    Recommendations for Other Services       Precautions / Restrictions Precautions Precautions: Fall Restrictions Weight Bearing Restrictions: No      Mobility  Bed Mobility Overal bed mobility: Needs Assistance Bed Mobility: Supine to Sit     Supine to sit: Mod assist;+2 for physical assistance;+2 for safety/equipment     General bed mobility comments: Pt struggling to follow verbal cues but assisted with task if movement initiated for him  Transfers Overall transfer level: Needs assistance Equipment used: Rolling walker (2 wheeled) Transfers: Sit to/from Stand Sit to Stand: Mod assist;+2 physical assistance;+2 safety/equipment         General transfer comment: cues for use of UEs; Physical assist to bring wt up and fwd and to balance in standing  Ambulation/Gait Ambulation/Gait assistance: +2 physical assistance;+2 safety/equipment;Min assist;Mod assist Gait Distance (Feet): 18 Feet Assistive device: Rolling walker (2 wheeled) Gait Pattern/deviations: Step-through pattern;Decreased step length - right;Decreased step length - left;Shuffle;Wide base of support;Trunk flexed Gait velocity: decr   General Gait Details: cues for posture, position from RW and safety awareness.  Pt with noted mild scissoring gait and requiring min/mod assist of two for  balance, RW management  Stairs            Wheelchair Mobility    Modified Rankin (Stroke Patients Only)       Balance Overall balance assessment: Needs assistance Sitting-balance support: No upper extremity supported Sitting balance-[name] Scale: Good     Standing balance support: Bilateral upper extremity supported Standing balance-[name] Scale: Poor Standing balance comment: posterior lean                             Pertinent Vitals/Pain Pain Assessment: No/denies pain    Home Living Family/patient expects to be discharged to:: Unsure                 Additional Comments: Pt is resident at [facility]    Prior Function           Comments: RN advises she has spoken to dtr - pt is single person assist at memory care unit for transfers     Hand Dominance        Extremity/Trunk Assessment   Upper Extremity Assessment Upper Extremity Assessment: Generalized weakness    Lower Extremity Assessment Lower Extremity Assessment: Generalized weakness       Communication   Communication: No difficulties  Cognition Arousal/Alertness: Awake/alert Behavior During Therapy: Flat affect Overall Cognitive Status: History of cognitive impairments - at baseline                                 General Comments: Pt oriented to self only`      General Comments      Exercises  Assessment/Plan    PT Assessment Patient needs continued PT services  PT Problem List Decreased strength;Decreased activity tolerance;Decreased balance;Decreased mobility;Decreased cognition;Decreased knowledge of use of DME;Decreased safety awareness       PT Treatment Interventions DME instruction;Gait training;Functional mobility training;Therapeutic activities;Balance training;Therapeutic exercise;Cognitive remediation    PT Goals (Current goals can be found in the Care Plan section)  Acute Rehab PT Goals Patient Stated Goal: No  goals expressed PT Goal Formulation: Patient unable to participate in goal setting Time For Goal Achievement: 01/06/20 Potential to Achieve Goals: Fair    Frequency Min 3X/week   Barriers to discharge        Co-evaluation               AM-PAC PT "6 Clicks" Mobility  Outcome Measure Help needed turning from your back to your side while in a flat bed without using bedrails?: A Lot Help needed moving from lying on your back to sitting on the side of a flat bed without using bedrails?: A Lot Help needed moving to and from a bed to a chair (including a wheelchair)?: A Lot Help needed standing up from a chair using your arms (e.g., wheelchair or bedside chair)?: A Lot Help needed to walk in hospital room?: A Lot Help needed climbing 3-5 steps with a railing? : Total 6 Click Score: 11    End of Session Equipment Utilized During Treatment: Gait belt Activity Tolerance: Patient tolerated treatment well Patient left: in chair;with call bell/phone within reach;with chair alarm set;with nursing/sitter in room Nurse Communication: Mobility status PT Visit Diagnosis: Difficulty in walking, not elsewhere classified (R26.2);Muscle weakness (generalized) (M62.81)    Time: 0981-1914 PT Time Calculation (min) (ACUTE ONLY): 24 min   Charges:   PT Evaluation $PT Eval Moderate Complexity: [place] Pager [phone] Office [phone]   [facility] 12/23/2019, 4:39 PM

## 2019-12-23 NOTE — Progress Notes (Signed)
PROGRESS NOTE  Anthony Hayes  DOB: 1935-04-05  PCP: Etter Sjogren, DO IEP:[id number]  DOA: 12/19/2019  LOS: 4 days   Chief Complaint  Patient presents with  . COVID POSITIVE   Brief narrative: [name] a84 y.o.male with PMH of hypertension, hyperlipidemia, ckd stage3, h/o CVA, hypothyroidism, depression, dementia, who lives at [facility] memory care. Patient was sent to ER for fever, and hypoxia.   In ED, patient required 3 L via nasal cannula to maintain oxygen saturation at 95%  CTA chest showed multifocal patchy/ground-glass opacities throughout both lungs, consistent with COVID pneumonia.  WBC count 11.5, procalcitonin 0.24,  COVID-19 PCR positive Urinalysis showed amber-colored urine with leukocytes. Patient was admitted to hospitalist medicine service for further evaluation management  Subjective: Patient was seen and examined this morning.  Elderly Caucasian male.  Lying down in bed.  Alert, awake, slow to respond.  Oriented to place and person.  Not to time.  Able to follow motor commands.  Assessment/Plan:. COVID pneumonia Acute respiratory failure with hypoxia -Presented with fever, hypoxia -CTA chest showed multifocal groundglass opacities throughout both lungs -Treatment: Currently on IV Decadron 6 mg daily for 10 days and IV remdesivir to complete 5-day course today.  -Supportive care: Vitamin C, Zinc, inhalers, Tylenol, Antitussives -benzonatate, Mucinex -Oxygen - SpO2: 100 % O2 Flow Rate (L/min): 1 L/min -Labs and biomarker trend as below.  All biomarkers improving. -Chest x-ray repeated on 12/25 showed stable to mildly regressed pneumonia  Lab Results  Component Value Date   SARSCOV2NAA POSITIVE (A) 12/19/2019    Recent Labs  Lab 12/19/19 1900 12/20/19 0302 12/21/19 0245 12/22/19 0347 12/23/19 0321  WBC 11.5* 9.2 10.5 8.4 6.4   Recent Labs    12/21/19 0245 12/22/19 0347 12/23/19 0321  DDIMER 0.96* 0.70* 0.51*  FERRITIN 382* 366*  212  LDH 219* 157 148  CRP 13.0* 6.9* 3.8*   Aspiration pneumonia -Initial procalcitonin level was elevated at 0.24.   -Aspiration pneumonia was suspected.  IV Unasyn was started. -Speech therapy evaluation was obtained.  Moderate risk of aspiration.  Recommended aspiration precautions with thin liquids. -I noticed that patient is only on full liquids at this time.  According to patient's daughter Ms. Whitney who is also a speech therapist, patient was on regular diet prior to this hospitalization.  Acute Lower UTI, poA -WBC on Admission was 11.5 -> 9.2 -> 10.5 -Urinalysis showed clear appearance with amber color, negative glucose, negative hemoglobin, moderate leukocytes, negative nitrites, rare bacteria, 0-5 RBCs per high-power field, and greater than fifty WBCs -Urine Culture showed no growth -Currently on IV Rocephin.  Hypokalemia -Patient's potassium on admission was 2.6, improved with replacement.   Severe Protein Calorie Malnutrition -C/w ProStat 65m po bid  Hypertension -Continue Amlodipine 54mpo qday and Hydralazine 2524mo TID -BP was 140/70 -Continue to Monitor Blood Pressure per Protocol   Sinus bradycardia -Patient has chronic bradycardia, asymptomatic.  Hyperlipidemia Hx of TIA -Continue Plavix 65m23m qday and  Pravastatin 10mg77mday  Anxiety/ Dementia -C/w Sertraline 125 mg po Daily   Gerd -Continue PPI with Pantoprazole 40 mg po daily   Hyperglycemia -A1c 5.4 -In setting of steroid demargination -Started on sensitive NovoLog/scale every 6 -Continue to monitor and trend blood sugars very carefully; CBGs have been ranging from 90-158 -Adjust insulin regimen as necessary  Impaired mobility -Per family, at baseline, patient is 1 assist for transfer.  He is in a memory unit of an ALF.  Family would prefer to get  him back there and not to SNF.  Mobility: PT eval pending. Diet: Currently on full liquid diet only. ?advancement Fluid: None DVT  prophylaxis:  Lovenox subcu Code Status:  DNR/DNI Family Communication:  I called and updated 2 of patient's daughters yesterday.  No change in status at this time. Expected Discharge:  To complete remdesivir on 12/26.  Hopefully back to SNF after that.  Pending PT/OT eval  Consultants:  None  Procedures:  None  Antimicrobials: Anti-infectives (From admission, onward)   Start     Dose/Rate Route Frequency Ordered Stop   12/20/19 1000  remdesivir 100 mg in sodium chloride 0.9 % 100 mL IVPB     100 mg 200 mL/hr over 30 Minutes Intravenous Daily 12/19/19 2241 12/23/19 1033   12/20/19 0400  cefTRIAXone (ROCEPHIN) 1 g in sodium chloride 0.9 % 100 mL IVPB     1 g 200 mL/hr over 30 Minutes Intravenous Every 24 hours 12/20/19 0022     12/19/19 2330  remdesivir 200 mg in sodium chloride 0.9% 250 mL IVPB     200 mg 580 mL/hr over 30 Minutes Intravenous Once 12/19/19 2241 12/20/19 0117   12/19/19 2015  Ampicillin-Sulbactam (UNASYN) 3 g in sodium chloride 0.9 % 100 mL IVPB     3 g 200 mL/hr over 30 Minutes Intravenous  Once 12/19/19 2002 12/19/19 2220   12/19/19 1945  ceFEPIme (MAXIPIME) 1 g in sodium chloride 0.9 % 100 mL IVPB  Status:  Discontinued     1 g 200 mL/hr over 30 Minutes Intravenous  Once 12/19/19 1934 12/19/19 1938   12/19/19 1945  vancomycin (VANCOREADY) IVPB 1500 mg/300 mL  Status:  Discontinued     1,500 mg 150 mL/hr over 120 Minutes Intravenous STAT 12/19/19 1938 12/19/19 1952   12/19/19 1945  ceFEPIme (MAXIPIME) 2 g in sodium chloride 0.9 % 100 mL IVPB  Status:  Discontinued     2 g 200 mL/hr over 30 Minutes Intravenous  Once 12/19/19 1938 12/19/19 1952        Code Status: DNR   Diet Order            Diet full liquid Room service appropriate? Yes; Fluid consistency: Thin  Diet effective now              Infusions:  . cefTRIAXone (ROCEPHIN)  IV 1 g (12/23/19 1039)    Scheduled Meds: . amLODipine  5 mg Oral Daily  . cholecalciferol  1,000 Units Oral Daily   . clopidogrel  75 mg Oral Daily  . dexamethasone (DECADRON) injection  6 mg Intravenous Q24H  . diphenhydrAMINE  12.5 mg Oral BID  . enoxaparin (LOVENOX) injection  40 mg Subcutaneous QHS  . feeding supplement (PRO-STAT SUGAR FREE 64)  30 mL Oral BID  . hydrALAZINE  25 mg Oral TID  . insulin aspart  0-9 Units Subcutaneous Q6H  . Ipratropium-Albuterol  1 puff Inhalation Q6H  . Melatonin  6 mg Oral QHS  . pantoprazole  40 mg Oral Daily  . pravastatin  10 mg Oral q1800  . sertraline  100 mg Oral Daily  . sertraline  25 mg Oral Daily    PRN meds: acetaminophen, albuterol, chlorpheniramine-HYDROcodone, guaiFENesin-dextromethorphan   Objective: Vitals:   12/23/19 0605 12/23/19 1405  BP: (!) 155/84 (!) 144/69  Pulse: (!) 40 (!) 41  Resp: 18 16  Temp: 98.1 F (36.7 C) 97.6 F (36.4 C)  SpO2: 95% 100%    Intake/Output Summary (Last 24 hours) at 12/23/2019  [place] filed at 12/23/2019 1400 Gross per 24 hour  Intake 1080 ml  Output 1100 ml  Net -20 ml   Filed Weights   12/20/19 0115 12/20/19 0801  Weight: 70.8 kg 70.8 kg   Weight change:  Body mass index is 25.19 kg/m.   Physical Exam: General exam: Appears calm and comfortable.  Skin: No rashes, lesions or ulcers. HEENT: Atraumatic, normocephalic, supple neck, no obvious bleeding Lungs: Clear to auscultation bilaterally CVS: Regular rate and rhythm, no murmur GI/Abd soft, nontender, nondistended, bowel sound present CNS: Alert, awake, cooperative, oriented to place, not to person or time at the time of my evaluation Psychiatry: Mood appropriate Extremities: No pedal edema, no calf tenderness  Data Review: I have personally reviewed the laboratory data and studies available.  Recent Labs  Lab 12/19/19 1900 12/20/19 0302 12/21/19 0245 12/22/19 0347 12/23/19 0321  WBC 11.5* 9.2 10.5 8.4 6.4  NEUTROABS 8.7* 8.3* 9.1* 7.1 5.4  HGB 11.2* 10.7* 10.1* 10.3* 11.0*  HCT 33.0* 32.4* 30.5* 31.3* 33.6*  MCV 90.4  91.8 91.3 91.0 91.1  PLT 290 256 267 319 317   Recent Labs  Lab 12/20/19 0302 12/20/19 1106 12/21/19 0245 12/22/19 0347 12/23/19 0321  NA 142 143 144 141 139  K 2.8* 3.0* 4.4 3.7 3.9  CL 112* 115* 116* 111 104  CO2 20* 19* 19* 18* 23  GLUCOSE 200* 142* 130* 101* 129*  BUN 27* 27* 31* 31* 26*  CREATININE 1.09 0.98 0.92 0.95 0.80  CALCIUM 8.7* 8.8* 9.0 8.8* 8.7*  MG  --   --  2.1 1.9  --   PHOS  --   --  2.1* 3.3  --     Terrilee Croak, MD  [facility] 12/23/2019

## 2019-12-23 NOTE — Plan of Care (Signed)
Patient maintains oxygen saturation in the 90's on room air, tolerating full liquid diet without issue.  Patient sat up in chair for several hours this shift, back to bed with walker and one assist.  This RN did speak with daughter on the phone for update.

## 2019-12-23 NOTE — Evaluation (Signed)
Occupational Therapy Evaluation Patient Details Name: Anthony Hayes MRN: [id number] DOB: [birth year] Today's Date: 12/23/2019    History of Present Illness Pt admitted with Covid+ PNA and with hx of dementia, TIA, lumbar fusion, L RCR CKD and colectomy   Clinical Impression   Pt admitted with COVID. Pt currently with functional limitations due to the deficits listed below (see OT Problem List).  Pt will benefit from skilled OT to increase their safety and independence with ADL and functional mobility for ADL to facilitate discharge to venue listed below.   Will defer OT tx back to ALF as appropriate.      Follow Up Recommendations  Other (comment)(back to memory care at ALF)    Equipment Recommendations  None recommended by OT    Recommendations for Other Services       Precautions / Restrictions Precautions Precautions: Fall Restrictions Weight Bearing Restrictions: No      Mobility Bed Mobility Overal bed mobility: Needs Assistance Bed Mobility: Supine to Sit     Supine to sit: Mod assist;+2 for physical assistance;+2 for safety/equipment     General bed mobility comments: Pt struggling to follow verbal cues but assisted with task if movement initiated for him  Transfers Overall transfer level: Needs assistance Equipment used: Rolling walker (2 wheeled) Transfers: Sit to/from Stand Sit to Stand: Mod assist;+2 physical assistance;+2 safety/equipment         General transfer comment: cues for use of UEs; Physical assist to bring wt up and fwd and to balance in standing    Balance Overall balance assessment: Needs assistance Sitting-balance support: No upper extremity supported Sitting balance-[name] Scale: Good     Standing balance support: Bilateral upper extremity supported Standing balance-[name] Scale: Poor Standing balance comment: posterior lean                           ADL either performed or assessed with clinical judgement   ADL  Overall ADL's : Needs assistance/impaired Eating/Feeding: Set up   Grooming: Set up                                 General ADL Comments: Pt from ALF memory care. Pt will need A with all ADL activity including toileting upon return to facility.                  Pertinent Vitals/Pain Pain Assessment: No/denies pain        Extremity/Trunk Assessment Upper Extremity Assessment Upper Extremity Assessment: Generalized weakness   Lower Extremity Assessment Lower Extremity Assessment: Generalized weakness       Communication Communication Communication: No difficulties   Cognition Arousal/Alertness: Awake/alert Behavior During Therapy: Flat affect Overall Cognitive Status: History of cognitive impairments - at baseline                                 General Comments: Pt oriented to self only`              Home Living Family/patient expects to be discharged to:: Other (Comment)                                 Additional Comments: Pt is resident at [facility]      Prior Functioning/Environment  Comments: RN advises she has spoken to dtr - pt is single person assist at memory care unit for transfers        OT Problem List: Decreased strength;Impaired balance (sitting and/or standing);Decreased knowledge of use of DME or AE         OT Goals(Current goals can be found in the care plan section) Acute Rehab OT Goals Patient Stated Goal: No goals expressed  OT Frequency:                AM-PAC OT "6 Clicks" Daily Activity     Outcome Measure Help from another person eating meals?: None Help from another person taking care of personal grooming?: A Little Help from another person toileting, which includes using toliet, bedpan, or urinal?: A Lot Help from another person bathing (including washing, rinsing, drying)?: A Lot Help from another person to put on and taking off regular upper body  clothing?: A Little Help from another person to put on and taking off regular lower body clothing?: A Lot 6 Click Score: 16   End of Session Equipment Utilized During Treatment: Rolling walker;Gait belt Nurse Communication: Mobility status  Activity Tolerance: Patient tolerated treatment well Patient left: in chair;with call bell/phone within reach;with chair alarm set  OT Visit Diagnosis: Unsteadiness on feet (R26.81);Other abnormalities of gait and mobility (R26.89);Muscle weakness (generalized) (M62.81)                Time: 9211-9417 OT Time Calculation (min): 21 min Charges:  OT General Charges $OT Visit: 1 Visit OT Evaluation $OT Eval Low Complexity: 1 Low  Kari Baars, OT Acute Rehabilitation Services [phone] Office- [phone], Edwena Felty D 12/23/2019, 5:02 PM

## 2019-12-24 LAB — CBC WITH DIFFERENTIAL/PLATELET
Abs Immature Granulocytes: 0.15 10*3/uL — ABNORMAL HIGH (ref 0.00–0.07)
Basophils Absolute: 0 10*3/uL (ref 0.0–0.1)
Basophils Relative: 0 %
Eosinophils Absolute: 0 10*3/uL (ref 0.0–0.5)
Eosinophils Relative: 0 %
HCT: 33.1 % — ABNORMAL LOW (ref 39.0–52.0)
Hemoglobin: 11.3 g/dL — ABNORMAL LOW (ref 13.0–17.0)
Immature Granulocytes: 2 %
Lymphocytes Relative: 9 %
Lymphs Abs: 0.8 10*3/uL (ref 0.7–4.0)
MCH: 30.7 pg (ref 26.0–34.0)
MCHC: 34.1 g/dL (ref 30.0–36.0)
MCV: 89.9 fL (ref 80.0–100.0)
Monocytes Absolute: 0.5 10*3/uL (ref 0.1–1.0)
Monocytes Relative: 6 %
Neutro Abs: 6.8 10*3/uL (ref 1.7–7.7)
Neutrophils Relative %: 83 %
Platelets: 344 10*3/uL (ref 150–400)
RBC: 3.68 MIL/uL — ABNORMAL LOW (ref 4.22–5.81)
RDW: 13.2 % (ref 11.5–15.5)
WBC: 8.2 10*3/uL (ref 4.0–10.5)
nRBC: 0.2 % (ref 0.0–0.2)

## 2019-12-24 LAB — COMPREHENSIVE METABOLIC PANEL
ALT: 41 U/L (ref 0–44)
AST: 41 U/L (ref 15–41)
Albumin: 2.6 g/dL — ABNORMAL LOW (ref 3.5–5.0)
Alkaline Phosphatase: 70 U/L (ref 38–126)
Anion gap: 7 (ref 5–15)
BUN: 24 mg/dL — ABNORMAL HIGH (ref 8–23)
CO2: 24 mmol/L (ref 22–32)
Calcium: 8.4 mg/dL — ABNORMAL LOW (ref 8.9–10.3)
Chloride: 101 mmol/L (ref 98–111)
Creatinine, Ser: 0.87 mg/dL (ref 0.61–1.24)
GFR calc Af Amer: >60 mL/min (ref >60)
GFR calc non Af Amer: >60 mL/min (ref >60)
Glucose, Bld: 122 mg/dL — ABNORMAL HIGH (ref 70–99)
Potassium: 3.4 mmol/L — ABNORMAL LOW (ref 3.5–5.1)
Sodium: 132 mmol/L — ABNORMAL LOW (ref 135–145)
Total Bilirubin: 0.6 mg/dL (ref 0.3–1.2)
Total Protein: 5.7 g/dL — ABNORMAL LOW (ref 6.5–8.1)

## 2019-12-24 LAB — CULTURE, BLOOD (ROUTINE X 2)
Culture: NO GROWTH
Culture: NO GROWTH
Special Requests: ADEQUATE
Special Requests: ADEQUATE

## 2019-12-24 LAB — SEDIMENTATION RATE: Sed Rate: 32 mm/hr — ABNORMAL HIGH (ref 0–16)

## 2019-12-24 LAB — C-REACTIVE PROTEIN: CRP: 1.9 mg/dL — ABNORMAL HIGH (ref <1.0)

## 2019-12-24 LAB — FIBRINOGEN: Fibrinogen: 528 mg/dL — ABNORMAL HIGH (ref 210–475)

## 2019-12-24 LAB — GLUCOSE, CAPILLARY
Glucose-Capillary: 105 mg/dL — ABNORMAL HIGH (ref 70–99)
Glucose-Capillary: 127 mg/dL — ABNORMAL HIGH (ref 70–99)
Glucose-Capillary: 122 mg/dL — ABNORMAL HIGH (ref 70–99)
Glucose-Capillary: 135 mg/dL — ABNORMAL HIGH (ref 70–99)

## 2019-12-24 LAB — FERRITIN: Ferritin: 163 ng/mL (ref 24–336)

## 2019-12-24 LAB — D-DIMER, QUANTITATIVE: D-Dimer, Quant: 0.56 ug/mL-FEU — ABNORMAL HIGH (ref 0.00–0.50)

## 2019-12-24 LAB — LACTATE DEHYDROGENASE: LDH: 139 U/L (ref 98–192)

## 2019-12-24 NOTE — Progress Notes (Signed)
CSW created FL-2 and provider agreed to sign, CSW will fax to [facility] at ph: [phone].  CSW will continue to follow for D/C needs.  Alphonse Guild. Maye Parkinson, LCSW, LCAS, CSI Transitions of Care Clinical Social Worker Care Coordination Department Ph: [phone]

## 2019-12-24 NOTE — NC FL2 (Signed)
[place] LEVEL OF CARE SCREENING TOOL     IDENTIFICATION  Patient Name: Anthony Hayes Birthdate: 01-14-1935 Sex: male Admission Date (Current Location): 12/19/2019  [facility] and [place] Number:  Herbalist and Address:  [facility],  [place] [address] [place]      Provider Number: [id number]  Attending Physician Name and Address:  Terrilee Croak, MD  Relative Name and Phone Number:       Current Level of Care: Hospital Recommended Level of Care: [place] Prior Approval Number:    Date Approved/Denied:   PASRR Number: [id number]  Discharge Plan: SNF    Current Diagnoses: Patient Active Problem List   Diagnosis Date Noted  . COVID-19 virus infection 12/19/2019  . Protein-calorie malnutrition, severe ([place]) 12/19/2019  . HCAP (healthcare-associated pneumonia) 11/22/2018  . Fall 11/22/2018  . Chronic diastolic CHF (congestive heart failure) ([place]) 11/22/2018  . Hypoxia   . Bradycardia 10/28/2018  . Leg swelling 10/28/2018  . Irregular heart beat 09/08/2018  . Sleep disturbance   . Stage 3 chronic kidney disease   . Benign essential HTN   . Hypokalemia   . Hyponatremia   . Infarction of right basal ganglia ([place]) 04/06/2018  . Pressure injury of skin 04/06/2018  . Vitamin B12 deficiency   . Hypothyroidism   . Crohn's disease with complication ([place])   . Benign prostatic hyperplasia   . Acute lower UTI   . Dementia without behavioral disturbance ([place])   . Acute ischemic stroke ([place]) 04/05/2018  . Leukocytosis   . Shortness of breath   . Acute encephalopathy   . CVA (cerebral vascular accident) ([place]) 04/03/2018  . Calculus, kidney 01/21/2018  . Acute blood loss anemia   . Mallory-Weiss syndrome   . Esophageal stricture   . Respiratory failure ([place])   . UGIB (upper gastrointestinal bleed) 10/16/2017  . GERD (gastroesophageal reflux disease) 10/16/2017  . Essential hypertension, benign 10/16/2017  .  Chest pain 10/15/2017  . Hypothyroidism (acquired) 06/08/2017  . Chronic kidney disease, stage III (moderate) 03/19/2016  . Dyslipidemia 03/19/2016  . Degeneration of lumbar or lumbosacral intervertebral disc 07/10/2008  . Arthropathy, lower leg 05/23/2004    Orientation RESPIRATION BLADDER Height & Weight     Self, Time, Situation, Place  Normal Incontinent Weight: 156 lb 1.4 oz (70.8 kg) Height:  5' 6"  (167.6 cm)  BEHAVIORAL SYMPTOMS/MOOD NEUROLOGICAL BOWEL NUTRITION STATUS      Incontinent Diet(Full liquid in hospital, but may go back to regular diet at D/C - see report)  AMBULATORY STATUS COMMUNICATION OF NEEDS Skin   Extensive Assist Verbally Normal                       Personal Care Assistance Level of Assistance  Bathing, Dressing Bathing Assistance: Limited assistance   Dressing Assistance: Limited assistance     Functional Limitations Info             SPECIAL CARE FACTORS FREQUENCY                       Contractures Contractures Info: Not present    Additional Factors Info  Code Status, Allergies Code Status Info: DNR Allergies Info: Lisinopril           Current Medications (12/24/2019):  This is the current hospital active medication list Current Facility-Administered Medications  Medication Dose Route Frequency Provider Last Rate Last Admin  . acetaminophen (TYLENOL) tablet 650 mg  650 mg Oral Q6H PRN Jani Gravel, MD      . albuterol (VENTOLIN HFA) 108 (90 Base) MCG/ACT inhaler 1-2 puff  1-2 puff Inhalation Q4H PRN Sheikh, Omair Latif, DO      . amLODipine (NORVASC) tablet 5 mg  5 mg Oral Daily Jani Gravel, MD   5 mg at 12/24/19 0941  . cefTRIAXone (ROCEPHIN) 1 g in sodium chloride 0.9 % 100 mL IVPB  1 g Intravenous Q24H Jani Gravel, MD 200 mL/hr at 12/24/19 1002 1 g at 12/24/19 1002  . chlorpheniramine-HYDROcodone (TUSSIONEX) 10-8 MG/5ML suspension 5 mL  5 mL Oral Q12H PRN Raiford Noble Latif, DO      . cholecalciferol (VITAMIN D) tablet  1,000 Units  1,000 Units Oral Daily Jani Gravel, MD   1,000 Units at 12/24/19 [phone]  . clopidogrel (PLAVIX) tablet 75 mg  75 mg Oral Daily Jani Gravel, MD   75 mg at 12/24/19 7741  . dexamethasone (DECADRON) injection 6 mg  6 mg Intravenous Q24H Jani Gravel, MD   6 mg at 12/23/19 1557  . diphenhydrAMINE (BENADRYL) 12.5 MG/5ML elixir 12.5 mg  12.5 mg Oral BID Jani Gravel, MD   12.5 mg at 12/24/19 0941  . enoxaparin (LOVENOX) injection 40 mg  40 mg Subcutaneous QHS Jani Gravel, MD   40 mg at 12/23/19 2249  . feeding supplement (PRO-STAT SUGAR FREE 64) liquid 30 mL  30 mL Oral BID Jani Gravel, MD   30 mL at 12/24/19 0943  . guaiFENesin-dextromethorphan (ROBITUSSIN DM) 100-10 MG/5ML syrup 5 mL  5 mL Oral Q4H PRN Sheikh, Georgina Quint Latif, DO      . hydrALAZINE (APRESOLINE) tablet 25 mg  25 mg Oral TID Jani Gravel, MD   25 mg at 12/24/19 [phone]  . insulin aspart (novoLOG) injection 0-9 Units  0-9 Units Subcutaneous Q6H Raiford Noble [place]   1 Units at 12/24/19 1245  . Ipratropium-Albuterol (COMBIVENT) respimat 1 puff  1 puff Inhalation Q6H Sheikh, Omair [place], DO   1 puff at 12/24/19 1348  . Melatonin TABS 6 mg  6 mg Oral QHS Jani Gravel, MD   6 mg at 12/23/19 2248  . pantoprazole (PROTONIX) EC tablet 40 mg  40 mg Oral Daily Jani Gravel, MD   40 mg at 12/24/19 0942  . pravastatin (PRAVACHOL) tablet 10 mg  10 mg Oral q1800 Jani Gravel, MD   10 mg at 12/23/19 2248  . sertraline (ZOLOFT) tablet 100 mg  100 mg Oral Daily Jani Gravel, MD   100 mg at 12/24/19 0942  . sertraline (ZOLOFT) tablet 25 mg  25 mg Oral Daily Jani Gravel, MD   25 mg at 12/24/19 6767     Discharge Medications: Please see discharge summary for a list of discharge medications.  Relevant Imaging Results:  Relevant Lab Results:   Additional Information [id number] - Pt is diabetic  Alphonse Guild [place], LCSW

## 2019-12-24 NOTE — Progress Notes (Signed)
Text paged on call provider RE pt HR. Pt stable and at cognitive baseline at this time. Will continue to monitor.

## 2019-12-24 NOTE — Progress Notes (Signed)
Spoke with daughter [name]) regarding the fact that patient will not be able to go back to facility this evening.  Daughter expressed understanding.  She is concerned that patient be sent back to facility on a regular diet as he was on prior to hospitalization.  Will discuss with physician.

## 2019-12-24 NOTE — Progress Notes (Signed)
PROGRESS NOTE  Anthony Hayes  DOB: 1935-04-25  PCP: Etter Sjogren, DO MBW:[id number]  DOA: 12/19/2019  LOS: 5 days   Chief Complaint  Patient presents with  . COVID POSITIVE   Brief narrative: [name] a84 y.o.male with PMH of hypertension, hyperlipidemia, ckd stage3, h/o CVA, hypothyroidism, depression, dementia, who lives at [facility] memory care. Patient was sent to ER for fever, and hypoxia.   In ED, patient required 3 L via nasal cannula to maintain oxygen saturation at 95%  CTA chest showed multifocal patchy/ground-glass opacities throughout both lungs, consistent with COVID pneumonia.  WBC count 11.5, procalcitonin 0.24,  COVID-19 PCR positive Urinalysis showed amber-colored urine with leukocytes. Patient was admitted to hospitalist medicine service for further evaluation management  Subjective: Patient was seen and examined this morning.  Elderly Caucasian male.  Lying down in bed.  Alert, awake, slow to respond.  Oriented to place and person.  Not to time.  Able to follow motor commands. Patient was eating pured diet with RN.  No observed choking.  Assessment/Plan:. COVID pneumonia Acute respiratory failure with hypoxia -Presented with fever, hypoxia -CTA chest showed multifocal groundglass opacities throughout both lungs -Treatment: Currently on IV Decadron 6 mg daily for 10 days.  Completed 5-day course of remdesivir on 12/26.  -Continue supportive care: Vitamin C, Zinc, inhalers, Tylenol, Antitussives -benzonatate, Mucinex -Oxygen - SpO2: 95 % O2 Flow Rate (L/min): 1 L/min -Labs and biomarker trend as below.  All biomarkers improving. -Chest x-ray repeated on 12/25 showed stable to mildly regressed pneumonia  Lab Results  Component Value Date   SARSCOV2NAA POSITIVE (A) 12/19/2019    Recent Labs  Lab 12/20/19 0302 12/21/19 0245 12/22/19 0347 12/23/19 0321 12/24/19 0343  WBC 9.2 10.5 8.4 6.4 8.2   Recent Labs    12/22/19 0347  12/23/19 0321 12/24/19 0343  DDIMER 0.70* 0.51* 0.56*  FERRITIN 366* 212 163  LDH 157 148 139  CRP 6.9* 3.8* 1.9*   Aspiration pneumonia -Initial procalcitonin level was elevated at 0.24.   -Aspiration pneumonia was suspected. IV Unasyn was started. -Speech therapy evaluation was obtained.  Moderate risk of aspiration.  Recommended aspiration precautions with thin liquids. -I noticed that patient is only on full liquids at this time.  According to patient's daughter Ms. Whitney who is also a speech therapist, patient was on regular diet prior to this hospitalization. -Speech therapy following.  Acute Lower UTI, poA -WBC on Admission was 11.5 -> 9.2 -> 10.5 -Urinalysis showed clear appearance with amber color, negative glucose, negative hemoglobin, moderate leukocytes, negative nitrites, rare bacteria, 0-5 RBCs per high-power field, and greater than fifty WBCs -Urine Culture showed no growth -Currently on IV Rocephin.  No plan to continue antibiotic post discharge.  Hypokalemia -Patient's potassium on admission was 2.6, improved with replacement.   Severe Protein Calorie Malnutrition -C/w ProStat 21m po bid  Hypertension -Continue Amlodipine 546mpo qday and Hydralazine 2570mo TID -BP was 140/70 -Continue to Monitor Blood Pressure per Protocol   Sinus bradycardia -Patient has chronic bradycardia, asymptomatic.  Hyperlipidemia Hx of TIA -Continue Plavix 60m32m qday and  Pravastatin 10mg67mday  Anxiety/ Dementia -C/w Sertraline 125 mg po Daily   Gerd -Continue PPI with Pantoprazole 40 mg po daily   Hyperglycemia -A1c 5.4 -In setting of steroid demargination -Started on sensitive NovoLog/scale every 6 -Continue to monitor and trend blood sugars very carefully; CBGs have been ranging from 90-158 -Adjust insulin regimen as necessary  Impaired mobility -Per family, at baseline,  patient is 1 assist for transfer.  He is in a memory unit of an ALF.  Family would  prefer to get him back there and not to SNF.  Mobility: PT eval pending. Diet: Currently on full liquid diet only. ?advancement Fluid: None DVT prophylaxis:  Lovenox subcu Code Status:  DNR/DNI Family Communication:  I called and updated 2 of patient's daughters yesterday.  No change in status at this time. Expected Discharge:  Plan to discharge back to ALF tomorrow. Consultants:  None  Procedures:  None  Antimicrobials: Anti-infectives (From admission, onward)   Start     Dose/Rate Route Frequency Ordered Stop   12/20/19 1000  remdesivir 100 mg in sodium chloride 0.9 % 100 mL IVPB     100 mg 200 mL/hr over 30 Minutes Intravenous Daily 12/19/19 2241 12/23/19 1033   12/20/19 0400  cefTRIAXone (ROCEPHIN) 1 g in sodium chloride 0.9 % 100 mL IVPB     1 g 200 mL/hr over 30 Minutes Intravenous Every 24 hours 12/20/19 0022     12/19/19 2330  remdesivir 200 mg in sodium chloride 0.9% 250 mL IVPB     200 mg 580 mL/hr over 30 Minutes Intravenous Once 12/19/19 2241 12/20/19 0117   12/19/19 2015  Ampicillin-Sulbactam (UNASYN) 3 g in sodium chloride 0.9 % 100 mL IVPB     3 g 200 mL/hr over 30 Minutes Intravenous  Once 12/19/19 2002 12/19/19 2220   12/19/19 1945  ceFEPIme (MAXIPIME) 1 g in sodium chloride 0.9 % 100 mL IVPB  Status:  Discontinued     1 g 200 mL/hr over 30 Minutes Intravenous  Once 12/19/19 1934 12/19/19 1938   12/19/19 1945  vancomycin (VANCOREADY) IVPB 1500 mg/300 mL  Status:  Discontinued     1,500 mg 150 mL/hr over 120 Minutes Intravenous STAT 12/19/19 1938 12/19/19 1952   12/19/19 1945  ceFEPIme (MAXIPIME) 2 g in sodium chloride 0.9 % 100 mL IVPB  Status:  Discontinued     2 g 200 mL/hr over 30 Minutes Intravenous  Once 12/19/19 1938 12/19/19 1952        Code Status: DNR   Diet Order            Diet full liquid Room service appropriate? Yes; Fluid consistency: Thin  Diet effective now              Infusions:  . cefTRIAXone (ROCEPHIN)  IV 1 g (12/24/19  1002)    Scheduled Meds: . amLODipine  5 mg Oral Daily  . cholecalciferol  1,000 Units Oral Daily  . clopidogrel  75 mg Oral Daily  . dexamethasone (DECADRON) injection  6 mg Intravenous Q24H  . diphenhydrAMINE  12.5 mg Oral BID  . enoxaparin (LOVENOX) injection  40 mg Subcutaneous QHS  . feeding supplement (PRO-STAT SUGAR FREE 64)  30 mL Oral BID  . hydrALAZINE  25 mg Oral TID  . insulin aspart  0-9 Units Subcutaneous Q6H  . Ipratropium-Albuterol  1 puff Inhalation Q6H  . Melatonin  6 mg Oral QHS  . pantoprazole  40 mg Oral Daily  . pravastatin  10 mg Oral q1800  . sertraline  100 mg Oral Daily  . sertraline  25 mg Oral Daily    PRN meds: acetaminophen, albuterol, chlorpheniramine-HYDROcodone, guaiFENesin-dextromethorphan   Objective: Vitals:   12/24/19 0609 12/24/19 1344  BP: (!) 145/64 (!) 106/59  Pulse: (!) 38 (!) 43  Resp: 16 20  Temp: (!) 97.5 F (36.4 C) 98.1 F (36.7 C)  SpO2: 92% 95%    Intake/Output Summary (Last 24 hours) at 12/24/2019 1601 Last data filed at 12/24/2019 1412 Gross per 24 hour  Intake --  Output 1500 ml  Net -1500 ml   Filed Weights   12/20/19 0115 12/20/19 0801  Weight: 70.8 kg 70.8 kg   Weight change:  Body mass index is 25.19 kg/m.   Physical Exam: General exam: Appears calm and comfortable.  Skin: No rashes, lesions or ulcers. HEENT: Atraumatic, normocephalic, supple neck, no obvious bleeding Lungs: Clear to auscultation bilaterally CVS: Regular rate and rhythm, no murmur GI/Abd soft, nontender, nondistended, bowel sound present CNS: Alert, awake, cooperative, oriented to place, not to person or time at the time of my evaluation Psychiatry: Mood appropriate Extremities: No pedal edema, no calf tenderness  Data Review: I have personally reviewed the laboratory data and studies available.  Recent Labs  Lab 12/20/19 0302 12/21/19 0245 12/22/19 0347 12/23/19 0321 12/24/19 0343  WBC 9.2 10.5 8.4 6.4 8.2  NEUTROABS 8.3*  9.1* 7.1 5.4 6.8  HGB 10.7* 10.1* 10.3* 11.0* 11.3*  HCT 32.4* 30.5* 31.3* 33.6* 33.1*  MCV 91.8 91.3 91.0 91.1 89.9  PLT 256 267 319 317 344   Recent Labs  Lab 12/20/19 1106 12/21/19 0245 12/22/19 0347 12/23/19 0321 12/24/19 0343  NA 143 144 141 139 132*  K 3.0* 4.4 3.7 3.9 3.4*  CL 115* 116* 111 104 101  CO2 19* 19* 18* 23 24  GLUCOSE 142* 130* 101* 129* 122*  BUN 27* 31* 31* 26* 24*  CREATININE 0.98 0.92 0.95 0.80 0.87  CALCIUM 8.8* 9.0 8.8* 8.7* 8.4*  MG  --  2.1 1.9  --   --   PHOS  --  2.1* 3.3  --   --     Terrilee Croak, MD  [facility] 12/24/2019

## 2019-12-24 NOTE — Progress Notes (Addendum)
CSW spoke to Clara from [facility] at [place] [facility] stating the patient cannot return until Monday whenan RN or Admin can review pt's FL-2 which would be needed for readmission.  Per Stanton Kidney, the F-2 would need to be faxed to: fax [phone] with attn: Tad Moore or Kassie Mends.  4:08 PM FL-2 faxed.  CSW will update RN and EDP.  Alphonse Guild. Candyce Gambino, Latanya Presser, LCAS Clinical Social Worker Ph: [phone]

## 2019-12-25 LAB — LACTATE DEHYDROGENASE: LDH: 145 U/L (ref 98–192)

## 2019-12-25 LAB — C-REACTIVE PROTEIN: CRP: 1.3 mg/dL — ABNORMAL HIGH (ref <1.0)

## 2019-12-25 LAB — GLUCOSE, CAPILLARY
Glucose-Capillary: 96 mg/dL (ref 70–99)
Glucose-Capillary: 105 mg/dL — ABNORMAL HIGH (ref 70–99)

## 2019-12-25 LAB — FIBRINOGEN: Fibrinogen: 512 mg/dL — ABNORMAL HIGH (ref 210–475)

## 2019-12-25 LAB — SEDIMENTATION RATE: Sed Rate: 45 mm/hr — ABNORMAL HIGH (ref 0–16)

## 2019-12-25 LAB — D-DIMER, QUANTITATIVE: D-Dimer, Quant: 0.49 ug/mL-FEU (ref 0.00–0.50)

## 2019-12-25 LAB — FERRITIN: Ferritin: 140 ng/mL (ref 24–336)

## 2019-12-25 MED ORDER — BENZONATATE 200 MG PO CAPS: 200.0000 mg | ORAL_CAPSULE | Freq: Three times a day (TID) | ORAL | 0 refills | Status: AC | PRN

## 2019-12-25 NOTE — Progress Notes (Signed)
Patient vital signs stable. IV removed.  NT & RN dressed patient and assisted him into wheelchair. Second NT accompanied NT & RN as they transported patient to exit where daughter and son-in-law were waiting. NT & RN assisted son-in-law with helping patient safely into their vehicle. Patient being transported by daughter and son-in-law to [facility].

## 2019-12-25 NOTE — NC FL2 (Signed)
[place] LEVEL OF CARE SCREENING TOOL     IDENTIFICATION  Patient Name: Anthony Hayes Birthdate: 07/03/1935 Sex: male Admission Date (Current Location): 12/19/2019  [facility] and [place] Number:  Herbalist and Address:  [facility],  [place] [address] [place]      Provider Number: [id number]  Attending Physician Name and Address:  Terrilee Croak, MD  Relative Name and Phone Number:       Current Level of Care: Hospital Recommended Level of Care: [place], Other (Comment)(Secure Memory Care Unit) Prior Approval Number:    Date Approved/Denied:   PASRR Number: [id number]  Discharge Plan: Other (Comment)(ALF Secure Memory Care Unit)    Current Diagnoses: Patient Active Problem List   Diagnosis Date Noted  . COVID-19 virus infection 12/19/2019  . Protein-calorie malnutrition, severe ([place]) 12/19/2019  . HCAP (healthcare-associated pneumonia) 11/22/2018  . Fall 11/22/2018  . Chronic diastolic CHF (congestive heart failure) ([place]) 11/22/2018  . Hypoxia   . Bradycardia 10/28/2018  . Leg swelling 10/28/2018  . Irregular heart beat 09/08/2018  . Sleep disturbance   . Stage 3 chronic kidney disease   . Benign essential HTN   . Hypokalemia   . Hyponatremia   . Infarction of right basal ganglia ([place]) 04/06/2018  . Pressure injury of skin 04/06/2018  . Vitamin B12 deficiency   . Hypothyroidism   . Crohn's disease with complication ([place])   . Benign prostatic hyperplasia   . Acute lower UTI   . Dementia without behavioral disturbance ([place])   . Acute ischemic stroke ([place]) 04/05/2018  . Leukocytosis   . Shortness of breath   . Acute encephalopathy   . CVA (cerebral vascular accident) ([place]) 04/03/2018  . Calculus, kidney 01/21/2018  . Acute blood loss anemia   . Mallory-Weiss syndrome   . Esophageal stricture   . Respiratory failure ([place])   . UGIB (upper gastrointestinal bleed) 10/16/2017  . GERD  (gastroesophageal reflux disease) 10/16/2017  . Essential hypertension, benign 10/16/2017  . Chest pain 10/15/2017  . Hypothyroidism (acquired) 06/08/2017  . Chronic kidney disease, stage III (moderate) 03/19/2016  . Dyslipidemia 03/19/2016  . Degeneration of lumbar or lumbosacral intervertebral disc 07/10/2008  . Arthropathy, lower leg 05/23/2004    Orientation RESPIRATION BLADDER Height & Weight     Self, Time, Situation, Place  Normal Incontinent Weight: 70.8 kg Height:  5' 6"  (167.6 cm)  BEHAVIORAL SYMPTOMS/MOOD NEUROLOGICAL BOWEL NUTRITION STATUS      Incontinent Diet(Full liquid in hospital, but may go back to regular diet at D/C - see report)  AMBULATORY STATUS COMMUNICATION OF NEEDS Skin   Extensive Assist Verbally Normal                       Personal Care Assistance Level of Assistance  Bathing, Dressing Bathing Assistance: Limited assistance   Dressing Assistance: Limited assistance     Functional Limitations Info             SPECIAL CARE FACTORS FREQUENCY                       Contractures Contractures Info: Not present    Additional Factors Info  Code Status, Allergies Code Status Info: DNR Allergies Info: Lisinopril           Current Medications (12/25/2019):  This is the current hospital active medication list Current Facility-Administered Medications  Medication Dose Route Frequency Provider Last Rate Last  Admin  . acetaminophen (TYLENOL) tablet 650 mg  650 mg Oral Q6H PRN Jani Gravel, MD      . albuterol (VENTOLIN HFA) 108 (90 Base) MCG/ACT inhaler 1-2 puff  1-2 puff Inhalation Q4H PRN Sheikh, Omair Latif, DO      . amLODipine (NORVASC) tablet 5 mg  5 mg Oral Daily Jani Gravel, MD   5 mg at 12/24/19 0941  . cefTRIAXone (ROCEPHIN) 1 g in sodium chloride 0.9 % 100 mL IVPB  1 g Intravenous Q24H Jani Gravel, MD 200 mL/hr at 12/24/19 1002 1 g at 12/24/19 1002  . chlorpheniramine-HYDROcodone (TUSSIONEX) 10-8 MG/5ML suspension 5 mL  5 mL Oral  Q12H PRN Raiford Noble Latif, DO      . cholecalciferol (VITAMIN D) tablet 1,000 Units  1,000 Units Oral Daily Jani Gravel, MD   1,000 Units at 12/24/19 [phone]  . clopidogrel (PLAVIX) tablet 75 mg  75 mg Oral Daily Jani Gravel, MD   75 mg at 12/24/19 1740  . dexamethasone (DECADRON) injection 6 mg  6 mg Intravenous Q24H Jani Gravel, MD   6 mg at 12/24/19 1609  . diphenhydrAMINE (BENADRYL) 12.5 MG/5ML elixir 12.5 mg  12.5 mg Oral BID Jani Gravel, MD   12.5 mg at 12/24/19 2305  . enoxaparin (LOVENOX) injection 40 mg  40 mg Subcutaneous QHS Jani Gravel, MD   40 mg at 12/24/19 2305  . feeding supplement (PRO-STAT SUGAR FREE 64) liquid 30 mL  30 mL Oral BID Jani Gravel, MD   30 mL at 12/24/19 2305  . guaiFENesin-dextromethorphan (ROBITUSSIN DM) 100-10 MG/5ML syrup 5 mL  5 mL Oral Q4H PRN Sheikh, Georgina Quint Latif, DO      . hydrALAZINE (APRESOLINE) tablet 25 mg  25 mg Oral TID Jani Gravel, MD   25 mg at 12/24/19 2306  . insulin aspart (novoLOG) injection 0-9 Units  0-9 Units Subcutaneous Q6H Raiford Noble [place], [place]   1 Units at 12/24/19 1245  . Ipratropium-Albuterol (COMBIVENT) respimat 1 puff  1 puff Inhalation Q6H Sheikh, Omair [place], DO   1 puff at 12/25/19 0253  . Melatonin TABS 6 mg  6 mg Oral QHS Jani Gravel, MD   6 mg at 12/24/19 2304  . pantoprazole (PROTONIX) EC tablet 40 mg  40 mg Oral Daily Jani Gravel, MD   40 mg at 12/24/19 0942  . pravastatin (PRAVACHOL) tablet 10 mg  10 mg Oral q1800 Jani Gravel, MD   10 mg at 12/24/19 2306  . sertraline (ZOLOFT) tablet 100 mg  100 mg Oral Daily Jani Gravel, MD   100 mg at 12/24/19 0942  . sertraline (ZOLOFT) tablet 25 mg  25 mg Oral Daily Jani Gravel, MD   25 mg at 12/24/19 8144     Discharge Medications: Please see discharge summary for a list of discharge medications.  Relevant Imaging Results:  Relevant Lab Results:   Additional Information [id number] - Pt is diabetic  Lya Holben, RN

## 2019-12-25 NOTE — Plan of Care (Signed)
  Problem: Education: Goal: Knowledge of General Education information will improve Description: Including pain rating scale, medication(s)/side effects and non-pharmacologic comfort measures Outcome: Adequate for Discharge   Problem: Activity: Goal: Risk for activity intolerance will decrease Outcome: Adequate for Discharge   Problem: Nutrition: Goal: Adequate nutrition will be maintained Outcome: Adequate for Discharge   Problem: Elimination: Goal: Will not experience complications related to bowel motility Outcome: Adequate for Discharge Goal: Will not experience complications related to urinary retention Outcome: Adequate for Discharge   Problem: Pain Managment: Goal: General experience of comfort will improve Outcome: Adequate for Discharge   Problem: Safety: Goal: Ability to remain free from injury will improve Outcome: Adequate for Discharge   Problem: Skin Integrity: Goal: Risk for impaired skin integrity will decrease Outcome: Adequate for Discharge

## 2019-12-25 NOTE — Progress Notes (Signed)
  Speech Language Pathology Treatment: Dysphagia  Patient Details Name: Anthony Hayes MRN: [id number] DOB: 1935-05-26 Today's Date: 12/25/2019 Time: 3500-9381 SLP Time Calculation (min) (ACUTE ONLY): 16 min  Assessment / Plan / Recommendation Clinical Impression  SLP was requested to reassess pt given that his daughter, who is also a speech therapist, would like for her father to be back on a regular diet. SLP provided diagnostic trials of regular solids and thin liquids with pt self-feeding. He paces himself well with solids and masticates seemingly well before swallowing. Coughing was elicited only with thin liquids via straw, which also seemed to cause more eructation as well. This may be more consistent with his esophageal hx, although the coughing is concerning for reduced airway protection particularly given that there is concern for aspiration PNA. Overall, I think there is a more chronic risk for aspiration given his esophageal hx, cognition, and reported coughing PTA; however, I think these risks appear to be mitigated (not eliminated) when given small bites and small cup sips. Will advance diet per daughter's request with use of these strategies to increase safety as much as possible.   HPI HPI: 84 yo male adm to [facility] with COVID 38, CVA, TIA, CHF, depression, GERD s/p stricture, UGI bleed.  Pt was seeing hospice/palliative prior to admission.  Concern for aspiration pneumonia present and pt treated with ABX.  Pt's family informed MD that pt was coughing with liquids prior to admission.  Swallow evaluation ordered.      SLP Plan  Continue with current plan of care       Recommendations  Diet recommendations: Regular;Thin liquid Liquids provided via: Cup;No straw Medication Administration: Whole meds with puree Supervision: Patient able to self feed;Intermittent supervision to cue for compensatory strategies Compensations: Slow rate;Small sips/bites Postural Changes and/or Swallow  Maneuvers: Upright 30-60 min after meal;Seated upright 90 degrees                Oral Care Recommendations: Oral care QID Follow up Recommendations: 24 hour supervision/assistance SLP Visit Diagnosis: Dysphagia, pharyngoesophageal phase (R13.14) Plan: Continue with current plan of care       GO               Osie Bond., M.A. [place] [facility] Pager [phone] Office [phone]  12/25/2019, 1:47 PM

## 2019-12-25 NOTE — Discharge Summary (Signed)
Physician Discharge Summary  Anthony Hayes NFA:[id number] DOB: 1935-10-10 DOA: 12/19/2019  PCP: Etter Sjogren, DO  Admit date: 12/19/2019 Discharge date: 12/25/2019  Admitted From: ALF/memory care Discharge disposition: Back to ALF/memory care   Code Status: DNR  Diet Recommendation: Cardiac diet   Recommendations for Outpatient Follow-Up:   1. Follow-up with PCP as an outpatient  Discharge Diagnosis:   Active Problems:   Chronic kidney disease, stage III (moderate)   Vitamin B12 deficiency   Hypothyroidism   Dementia without behavioral disturbance (HCC)   Hypokalemia   COVID-19 virus infection   Protein-calorie malnutrition, severe (HCC)  History of Present Illness / Brief narrative:  [name] a84 y.o.male with PMH of hypertension, hyperlipidemia, ckd stage3, h/o CVA, hypothyroidism, depression, dementia, who lives at [facility] memory care. Patient was sent to ER for fever, and hypoxia.   In ED, patient required 3 L via nasal cannula to maintain oxygen saturation at 95%  CTA chest showed multifocal patchy/ground-glass opacities throughout both lungs, consistent with COVID pneumonia.  WBC count 11.5, procalcitonin 0.24,  COVID-19 PCR positive Urinalysis showed amber-colored urine with leukocytes. Patient was admitted to hospitalist medicine service for further evaluation management  Hospital Course:  COVID pneumonia Acute respiratory failure with hypoxia -Presented with fever, hypoxia -CTA chest showed multifocal groundglass opacities throughout both lungs -Treatment: Currently on IV Decadron.  Patient is not on supplemental oxygen and hence does not need further dose of Decadron. Completed 5-day course of remdesivir on 12/26.  -Continue supportive care: Vitamin C, Zinc, inhalers, Tylenol, Antitussives -benzonatate, Mucinex -Oxygen - SpO2: 92% room air -Labs and biomarker trend as below.  All biomarkers improving. -Chest x-ray repeated on 12/25 showed  stable to mildly regressed pneumonia.  Lab Results  Component Value Date   SARSCOV2NAA POSITIVE (A) 12/19/2019    Recent Labs  Lab 12/20/19 0302 12/21/19 0245 12/22/19 0347 12/23/19 0321 12/24/19 0343  WBC 9.2 10.5 8.4 6.4 8.2   Recent Labs    12/23/19 0321 12/24/19 0343 12/25/19 0339  DDIMER 0.51* 0.56* 0.49  FERRITIN 212 163 140  LDH 148 139 145  CRP 3.8* 1.9* 1.3*   Aspiration pneumonia -Initial procalcitonin level was elevated at 0.24.   -Aspiration pneumonia was suspected.  Finished the course of IV antibiotics in the hospital. -Speech therapy evaluation was obtained.  Moderate risk of aspiration.  Recommended aspiration precautions with thin liquids. -I noticed that patient is only on full liquids at this time.  According to patient's daughter Ms. Whitney who is also a speech therapist, patient was on regular diet prior to this hospitalization.  Patient has been switched back to regular diet at discharge.  Acute Lower UTI, poA -WBC on Admission was 11.5 -> 9.2-> 10.5 -Urinalysis showed clear appearance with amber color, negative glucose, negative hemoglobin, moderate leukocytes, negative nitrites, rare bacteria, 0-5 RBCs per high-power field, and greater than fifty WBCs -Urine Cultureshowed no growth.  Hypokalemia -Patient's potassium on admission was 2.6, improved with replacement.   Severe Protein Calorie Malnutrition -C/w ProStat 25m po bid  Hypertension -Continue Amlodipine 522mpo qday and Hydralazine 2574mo TID  Sinus bradycardia -Patient has chronic bradycardia, asymptomatic.  Hyperlipidemia Hx of TIA -Continue Plavix 38m93m qday and Pravastatin 10mg57mday  Anxiety/ Dementia -C/w Sertraline 125 mg po Daily   Gerd -Continue PPI with Pantoprazole 40 mg po daily   Hyperglycemia -A1c 5.4 -In setting of steroid demargination.  No history of diabetes mellitus.    Impaired mobility -Per family, at  baseline, patient is 1 assist for  transfer.  He is in a memory unit of an ALF.  Family would prefer to get him back there and not to SNF.  Stable for discharge back to ALF today   Subjective:  Seen and examined this morning.  Pleasant elderly Caucasian male with dementia.  Not in distress.  Oriented to place only.  Not restless or agitated.  Discharge Exam:   Vitals:   12/24/19 0609 12/24/19 1344 12/24/19 2108 12/25/19 0638  BP: (!) 145/64 (!) 106/59 131/75 (!) 146/78  Pulse: (!) 38 (!) 43 (!) 43 (!) 43  Resp: 16 20 20 20   Temp: (!) 97.5 F (36.4 C) 98.1 F (36.7 C) 98.5 F (36.9 C) 98 F (36.7 C)  TempSrc: Oral Oral Oral Oral  SpO2: 92% 95% 95% 92%  Weight:      Height:        Body mass index is 25.19 kg/m.  General exam: Appears calm and comfortable.  Skin: No rashes, lesions or ulcers. HEENT: Atraumatic, normocephalic, supple neck, no obvious bleeding Lungs: Clear to auscultate bilaterally CVS: Regular rate and rhythm, no murmur GI/Abd soft, nontender, nondistended, bowel sound present CNS: Alert, awake, oriented to place Psychiatry: Mood appropriate Extremities: No pedal edema, no calf tenderness  Discharge Instructions:  Wound care: None Discharge Instructions    Diet - low sodium heart healthy   Complete by: As directed    Increase activity slowly   Complete by: As directed      Follow-up Information    Etter Sjogren, DO Follow up.   Specialty: Family Medicine Contact information: [place] [address] [phone]        Minus Breeding, MD .   Specialty: Cardiology Contact information: [address] [place] [address] [phone]          Allergies as of 12/25/2019      Reactions   Lisinopril Swelling   Swelling of lips      Medication List    TAKE these medications   acetaminophen 325 MG tablet Commonly known as: TYLENOL Take 650 mg by mouth every 6 (six) hours as needed for mild pain.   amLODipine  5 MG tablet Commonly known as: NORVASC Take 1 tablet (5 mg total) by mouth daily.   benzonatate 200 MG capsule Commonly known as: TESSALON Take 1 capsule (200 mg total) by mouth 3 (three) times daily as needed for up to 14 days for cough.   clopidogrel 75 MG tablet Commonly known as: PLAVIX Take 1 tablet (75 mg total) by mouth daily.   cyanocobalamin 1000 MCG/ML injection Commonly known as: (VITAMIN B-12) Inject 1,000 mcg into the muscle every 30 (thirty) days.   diphenhydrAMINE 12.5 MG/5ML elixir Commonly known as: BENADRYL Take 12.5 mg by mouth 2 (two) times daily. Ends 1.1.2021   hydrALAZINE 25 MG tablet Commonly known as: APRESOLINE Take 25 mg by mouth 3 (three) times daily.   Melatonin 3 MG Subl Place 6 mg under the tongue at bedtime.   pantoprazole 40 MG tablet Commonly known as: PROTONIX Take 1 tablet (40 mg total) by mouth daily.   pravastatin 10 MG tablet Commonly known as: Pravachol Take 1 tablet (10 mg total) by mouth daily.   sertraline 25 MG tablet Commonly known as: ZOLOFT Take 25 mg by mouth daily. Take with 129m zoloft to = 1270m  sertraline 100 MG tablet Commonly known as: ZOLOFT Take 1 tablet  by mouth daily.   Vitamin D3 25 MCG (1000 UT) Caps Take 1,000 Units by mouth daily.       Time coordinating discharge: 35 minutes  The results of significant diagnostics from this hospitalization (including imaging, microbiology, ancillary and laboratory) are listed below for reference.    Procedures and Diagnostic Studies:   CT Angio Chest PE W and/or Wo Contrast  Result Date: 12/19/2019 CLINICAL DATA:  Shortness of breath, COVID positive EXAM: CT ANGIOGRAPHY CHEST WITH CONTRAST TECHNIQUE: Multidetector CT imaging of the chest was performed using the standard protocol during bolus administration of intravenous contrast. Multiplanar CT image reconstructions and MIPs were obtained to evaluate the vascular anatomy. CONTRAST:  145m OMNIPAQUE IOHEXOL 350  MG/ML SOLN COMPARISON:  None. FINDINGS: Cardiovascular: There is slightly suboptimal opacification of the main pulmonary artery. However there is no filling defect in the central or segmental pulmonary arteries. There is mild cardiomegaly. No evidence of right ventricular heart strain. No pericardial effusion. There is normal three-vessel brachiocephalic anatomy without proximal stenosis. The thoracic aorta is normal in appearance. Coronary artery calcifications are seen. Mediastinum/Nodes: No hilar, mediastinal, or axillary adenopathy. Thyroid gland, trachea, and esophagus demonstrate no significant findings. Lungs/Pleura: Patchy/ground-glass peripherally based airspace opacities are seen throughout both lungs, predominantly within the right upper lung and lingula. There is also streaky airspace opacity at both lung bases. No pleural effusion or pneumothorax. Upper Abdomen: There is a small hiatal hernia. No acute abnormalities present in the visualized portions of the upper abdomen. Musculoskeletal: No chest wall abnormality. No acute or significant osseous findings. Review of the MIP images confirms the above findings. IMPRESSION: Slightly suboptimal opacification of the main pulmonary artery. No central or segmental pulmonary embolism. Multifocal patchy/ground-glass opacities throughout both lungs, consistent with COVID pneumonia. Aortic Atherosclerosis (ICD10-I70.0). Electronically Signed   By: BPrudencio PairM.D.   On: 12/19/2019 22:17   DG Chest Port 1 View  Result Date: 12/19/2019 CLINICAL DATA:  Covert pneumonia.  Hypoxia. EXAM: PORTABLE CHEST 1 VIEW COMPARISON:  11/21/2018 FINDINGS: Mild cardiomegaly. Indistinct peripheral airspace opacity noted in the right upper lobe and peripherally in the left mid lung. Suspected mild bibasilar atelectasis. Atherosclerotic calcification of the aortic arch. No pleural effusion is identified. IMPRESSION: 1. Hazy an indistinctly marginated peripheral airspace opacities  in the right upper lobe and left mid lung favoring pneumonia over mild edema. 2. Mild enlargement of the cardiopericardial silhouette. Electronically Signed   By: WVan ClinesM.D.   On: 12/19/2019 18:58     Labs:   Basic Metabolic Panel: Recent Labs  Lab 12/20/19 1106 12/21/19 0245 12/22/19 0347 12/23/19 0321 12/24/19 0343  NA 143 144 141 139 132*  K 3.0* 4.4 3.7 3.9 3.4*  CL 115* 116* 111 104 101  CO2 19* 19* 18* 23 24  GLUCOSE 142* 130* 101* 129* 122*  BUN 27* 31* 31* 26* 24*  CREATININE 0.98 0.92 0.95 0.80 0.87  CALCIUM 8.8* 9.0 8.8* 8.7* 8.4*  MG  --  2.1 1.9  --   --   PHOS  --  2.1* 3.3  --   --    GFR Estimated Creatinine Clearance: 59.1 mL/min (by C-G formula based on SCr of 0.87 mg/dL). Liver Function Tests: Recent Labs  Lab 12/20/19 1106 12/21/19 0245 12/22/19 0347 12/23/19 0321 12/24/19 0343  AST 19 22 22  37 41  ALT 14 17 18  32 41  ALKPHOS 72 67 68 69 70  BILITOT 0.5 0.6 0.6 0.5 0.6  PROT 6.3* 5.8* 6.0* 6.0*  5.7*  ALBUMIN 2.8* 2.5* 2.7* 2.6* 2.6*   No results for input(s): LIPASE, AMYLASE in the last 168 hours. No results for input(s): AMMONIA in the last 168 hours. Coagulation profile No results for input(s): INR, PROTIME in the last 168 hours.  CBC: Recent Labs  Lab 12/20/19 0302 12/21/19 0245 12/22/19 0347 12/23/19 0321 12/24/19 0343  WBC 9.2 10.5 8.4 6.4 8.2  NEUTROABS 8.3* 9.1* 7.1 5.4 6.8  HGB 10.7* 10.1* 10.3* 11.0* 11.3*  HCT 32.4* 30.5* 31.3* 33.6* 33.1*  MCV 91.8 91.3 91.0 91.1 89.9  PLT 256 267 319 317 344   Cardiac Enzymes: No results for input(s): CKTOTAL, CKMB, CKMBINDEX, TROPONINI in the last 168 hours. BNP: Invalid input(s): POCBNP CBG: Recent Labs  Lab 12/24/19 0604 12/24/19 1202 12/24/19 1723 12/24/19 2303 12/25/19 0626  GLUCAP 105* 127* 122* 135* 105*   D-Dimer Recent Labs    12/24/19 0343 12/25/19 0339  DDIMER 0.56* 0.49   Hgb A1c Recent Labs    12/23/19 0321  HGBA1C 5.4   Lipid Profile No  results for input(s): CHOL, HDL, LDLCALC, TRIG, CHOLHDL, LDLDIRECT in the last 72 hours. Thyroid function studies No results for input(s): TSH, T4TOTAL, T3FREE, THYROIDAB in the last 72 hours.  Invalid input(s): FREET3 Anemia work up Recent Labs    12/24/19 0343 12/25/19 0339  FERRITIN 163 140   Microbiology Recent Results (from the past 240 hour(s))  Blood Culture (routine x 2)     Status: None   Collection Time: 12/19/19  7:00 PM   Specimen: BLOOD LEFT HAND  Result Value Ref Range Status   Specimen Description   Final    BLOOD LEFT HAND Performed at [facility], [place] [address] [place], [place] [address]    Special Requests   Final    BOTTLES DRAWN AEROBIC AND ANAEROBIC Blood Culture adequate volume Performed at [place] [address] [place], [place] [address]    Culture   Final    NO GROWTH 5 DAYS Performed at [facility], [place] [address] [place], [place] [address]    Report Status 12/24/2019 FINAL  Final  Blood Culture (routine x 2)     Status: None   Collection Time: 12/19/19  7:00 PM   Specimen: BLOOD LEFT FOREARM  Result Value Ref Range Status   Specimen Description   Final    BLOOD LEFT FOREARM Performed at [place] [address], [place], [place] [address]    Special Requests   Final    BOTTLES DRAWN AEROBIC AND ANAEROBIC Blood Culture adequate volume Performed at [place] [address], [place], [place] [address]    Culture   Final    NO GROWTH 5 DAYS Performed at [facility], [place] [address] [place], [place] [address]    Report Status 12/24/2019 FINAL  Final  Urine Culture     Status: None   Collection Time: 12/19/19  7:25 PM   Specimen: Urine, Random  Result Value Ref Range Status   Specimen Description   Final    URINE, RANDOM Performed at [place] [address] [place], [place] [address]    Special Requests    Final    NONE Performed at [facility], [place] [address] [place], [place] [address]    Culture   Final    NO GROWTH Performed at [facility], [place] [address] [place], [place] [address]  Report Status 12/21/2019 FINAL  Final  SARS CORONAVIRUS 2 (TAT 6-24 HRS) Nasopharyngeal Nasopharyngeal Swab     Status: Abnormal   Collection Time: 12/19/19  9:00 PM   Specimen: Nasopharyngeal Swab  Result Value Ref Range Status   SARS Coronavirus 2 POSITIVE (A) NEGATIVE Final    Comment: RESULT CALLED TO, READ BACK BY AND VERIFIED WITH: H NJANG,RN 0330 12/20/2019 D BRADLEY (NOTE) SARS-CoV-2 target nucleic acids are DETECTED. The SARS-CoV-2 RNA is generally detectable in upper and lower respiratory specimens during the acute phase of infection. Positive results are indicative of the presence of SARS-CoV-2 RNA. Clinical correlation with patient history and other diagnostic information is  necessary to determine patient infection status. Positive results do not rule out bacterial infection or co-infection with other viruses.  The expected result is Negative. Fact Sheet for Patients: SugarRoll.be Fact Sheet for Healthcare Providers: https://www.woods-mathews.com/ This test is not yet approved or cleared by the [place] FDA and  has been authorized for detection and/or diagnosis of SARS-CoV-2 by FDA under an Emergency Use Authorization (EUA). This EUA will remain  in effect (meaning this test can be used) for th e duration of the COVID-19 declaration under Section 564(b)(1) of the Act, 21 U.S.C. section 360bbb-3(b)(1), unless the authorization is terminated or revoked sooner. Performed at [facility], [place] [address], [place], [place] [address]     Please note: You were cared for by a hospitalist during your hospital stay. Once you are discharged, your primary care physician will handle any further medical issues.  Please note that NO REFILLS for any discharge medications will be authorized once you are discharged, as it is imperative that you return to your primary care physician (or establish a relationship with a primary care physician if you do not have one) for your post hospital discharge needs so that they can reassess your need for medications and monitor your lab values.  Signed: Terrilee Croak  [facility] 12/25/2019, 11:41 AM

## 2019-12-28 ENCOUNTER — Non-Acute Institutional Stay: Payer: Medicare Other | Admitting: Internal Medicine

## 2019-12-28 ENCOUNTER — Other Ambulatory Visit: Payer: Self-pay

## 2019-12-28 ENCOUNTER — Encounter: Payer: Self-pay | Admitting: Internal Medicine

## 2019-12-28 DIAGNOSIS — F0390 Unspecified dementia without behavioral disturbance: Secondary | ICD-10-CM

## 2019-12-28 DIAGNOSIS — Z515 Encounter for palliative care: Secondary | ICD-10-CM

## 2019-12-28 NOTE — Progress Notes (Signed)
December 31st, 2020 Anthony Hayes Telephone: [phone] Fax: [phone]   PATIENT NAME: Anthony Hayes DOB: [birth year] MRN: [id number] [facility]    PRIMARY CARE PROVIDER:   Farrel Conners NP ([facility])    Anthony Hayes, [facility]. MAIN Anthony Hayes, Anthony Hayes [address] Dr. Jewel Hayes [phone] Dr. Minus Hayes (Cardiology)   REFERRING PROVIDER:Thu Alfonse Spruce NP   RESPONSIBLE PARTY:  *(dtr) Anthony Hayes [phone], (M) [phone]. Anthony Hayes [phone], [phone] ([address] (S-I-L) Dione Booze [phone]  IMPRESSION/RECOMMENDATIONS: 1.Advance Care Planning: A. Directives: DNR. MOST form details: DNR/DNI, Limited scope of medical interventions, Yes to Antibiotics and IVFs, feeding tube for a defined trial period.   B. Goals of Care: Daughters hoping for COVID restrictions to be lifted soon, so that they can visit their dad face to face.   2. Cognitive Functional Status: FAST 6e. Patient is oriented to person, place, and self. Needs assist with hygiene, dressing, and toileting. He consistently needs a 1-2 person assist to stand. He is incontinent of stool and of urine. He is weaker and more fatigued s/p very recent hospitalization for COVID pneumonia and UTI. He fell a few evenings ago, as he was reaching forward from his wheelchair, sustaining a mild above the L forehead abrasion. His appetite is improved; ate 100% of his breakfast this morning. He is able to feed himself.  He has had a slight drop in his BMI over the last 10 months; currently 25.18, from 26.5kg/m2. He is 5'6" tall.  Psych NP Anthony Hayes continues to follow; on Zoloft for depression.   3. Follow up: I called daughter Anthony Hayes with updates of today's visit. Will follow along every 3 months or so, or prn.   I spent 30 minutes providing this consultation from 8:30am-9am. More than 50% of the time in this  consultation was spent coordinating communication, interviewing of staff and family, and reconciling SN MAR with EPIC med list.      HISTORY OF PRESENT ILLNESS:  Anthony Hayes is a 84 y.o. male with medical h/o hyperlipidemia, stroke (April 2019, Plavix), GERD, hypothyroidism, depression, dementia, arthritis, BPH, Bradycardia ([facility]) and CKD 3 (creatinine 0.95).  admission  11/25-11/29/19 Hospitalization for sepsis secondary to pneumonia. He was discharged to [facility] for rehab, then readmitted to [facility] 12/16/2018.  12/22-12/28/20 Hospitalization for COVID pneumonia/UTI. This is a follow up Palliative Care visit (from 02/23/2019)    CODE STATUS: DNR.    PPS:  30%  HOSPICE ELIGIBILITY/DIAGNOSIS: no, as prognosis thought to be greater than 6 months.    PAST MEDICAL HISTORY:  Past Medical History:  Diagnosis Date  . Arthritis    "joints" (10/15/2017)  . Benign prostatic hyperplasia   . Complication of anesthesia    "last OR in 06/2017 affected him cognitively; hasn't got back to baseline yet" (10/15/2017)  . Dementia ([place])   . Depression   . GERD (gastroesophageal reflux disease)   . High cholesterol   . Hypertension   . Hypothyroidism   . Renal disease   . Renal disorder   . TIA (transient ischemic attack) early 2000s    SOCIAL HX:  Social History   Tobacco Use  . Smoking status: Former Smoker    Years: 7.00    Types: Cigarettes    Quit date: 1963    Years since quitting: 58.0  . Smokeless tobacco: Never  Used  Substance Use Topics  . Alcohol use: Yes    Comment: 10/15/2017 "a few drinks/year"    ALLERGIES:  Allergies  Allergen Reactions  . Lisinopril Swelling    Swelling of lips      PERTINENT MEDICATIONS:  Outpatient Encounter Medications as of 12/28/2019  Medication Sig  . amLODipine (NORVASC) 5 MG tablet Take 1 tablet (5 mg total) by mouth daily.  . Cholecalciferol (VITAMIN D3) 25 MCG (1000 UT) CAPS Take  1,000 Units by mouth daily.  . clopidogrel (PLAVIX) 75 MG tablet Take 1 tablet (75 mg total) by mouth daily.  . cyanocobalamin (,VITAMIN B-12,) 1000 MCG/ML injection Inject 1,000 mcg into the muscle every 30 (thirty) days.  . hydrALAZINE (APRESOLINE) 25 MG tablet Take 25 mg by mouth 3 (three) times daily.  [place] levothyroxine (SYNTHROID) 50 MCG tablet Take 50 mcg by mouth daily before breakfast.  . Melatonin 3 MG SUBL Place 6 mg under the tongue at bedtime.  . pantoprazole (PROTONIX) 40 MG tablet Take 1 tablet (40 mg total) by mouth daily.  . pravastatin (PRAVACHOL) 10 MG tablet Take 1 tablet (10 mg total) by mouth daily.  . sertraline (ZOLOFT) 100 MG tablet Take 1 tablet by mouth daily.  . sertraline (ZOLOFT) 25 MG tablet Take 25 mg by mouth daily. Take with 138m zoloft to = 1279m . acetaminophen (TYLENOL) 325 MG tablet Take 650 mg by mouth every 6 (six) hours as needed for mild pain.  . benzonatate (TESSALON) 200 MG capsule Take 1 capsule (200 mg total) by mouth 3 (three) times daily as needed for up to 14 days for cough.  . diphenhydrAMINE (BENADRYL) 12.5 MG/5ML elixir Take 12.5 mg by mouth 2 (two) times daily. Ends 1.1.2021   No facility-administered encounter medications on file as of 12/28/2019.    PHYSICAL EXAM:  HR 54, RR 16, sat RA 91%; improved to 97% after a few deep breaths. Well nourished elderly male who is alert; answering questions appropriately and following simple commands. He has a pleasant affect. Appears fatigued/frail. He had fed himself 100% of morning breakfast. Lung fields: posterior base inspiratory crackles. Cardiac: RRR, brady. Soft systolic murmur Extremities: no LE edema  Abd: soft, non-distended, NABS. Skin: Scattered healing abrasions over left eyebrow without signs infection.  Neurological: Weakness but otherwise non-focal.   Anthony Hayes

## 2020-01-14 ENCOUNTER — Other Ambulatory Visit: Payer: Self-pay

## 2020-01-14 ENCOUNTER — Inpatient Hospital Stay (HOSPITAL_COMMUNITY)
Admission: EM | Admit: 2020-01-14 | Discharge: 2020-01-18 | DRG: 177 | Disposition: A | Discharge status: Home Health | Payer: Medicare Other | Attending: Internal Medicine | Admitting: Internal Medicine

## 2020-01-14 ENCOUNTER — Encounter (HOSPITAL_COMMUNITY): Payer: Self-pay

## 2020-01-14 ENCOUNTER — Emergency Department (HOSPITAL_COMMUNITY): Payer: Medicare Other

## 2020-01-14 DIAGNOSIS — Z9842 Cataract extraction status, left eye: Secondary | ICD-10-CM

## 2020-01-14 DIAGNOSIS — K219 Gastro-esophageal reflux disease without esophagitis: Secondary | ICD-10-CM | POA: Diagnosis present

## 2020-01-14 DIAGNOSIS — Z9841 Cataract extraction status, right eye: Secondary | ICD-10-CM

## 2020-01-14 DIAGNOSIS — Z87442 Personal history of urinary calculi: Secondary | ICD-10-CM | POA: Diagnosis not present

## 2020-01-14 DIAGNOSIS — J9601 Acute respiratory failure with hypoxia: Secondary | ICD-10-CM | POA: Diagnosis present

## 2020-01-14 DIAGNOSIS — E039 Hypothyroidism, unspecified: Secondary | ICD-10-CM | POA: Diagnosis present

## 2020-01-14 DIAGNOSIS — E876 Hypokalemia: Secondary | ICD-10-CM | POA: Diagnosis present

## 2020-01-14 DIAGNOSIS — Z981 Arthrodesis status: Secondary | ICD-10-CM

## 2020-01-14 DIAGNOSIS — I10 Essential (primary) hypertension: Secondary | ICD-10-CM | POA: Diagnosis present

## 2020-01-14 DIAGNOSIS — J1282 Pneumonia due to coronavirus disease 2019: Secondary | ICD-10-CM | POA: Diagnosis present

## 2020-01-14 DIAGNOSIS — Z87891 Personal history of nicotine dependence: Secondary | ICD-10-CM

## 2020-01-14 DIAGNOSIS — Z961 Presence of intraocular lens: Secondary | ICD-10-CM | POA: Diagnosis present

## 2020-01-14 DIAGNOSIS — Z66 Do not resuscitate: Secondary | ICD-10-CM | POA: Diagnosis present

## 2020-01-14 DIAGNOSIS — F329 Major depressive disorder, single episode, unspecified: Secondary | ICD-10-CM | POA: Diagnosis present

## 2020-01-14 DIAGNOSIS — Z7902 Long term (current) use of antithrombotics/antiplatelets: Secondary | ICD-10-CM

## 2020-01-14 DIAGNOSIS — F0390 Unspecified dementia without behavioral disturbance: Secondary | ICD-10-CM | POA: Diagnosis present

## 2020-01-14 DIAGNOSIS — N400 Enlarged prostate without lower urinary tract symptoms: Secondary | ICD-10-CM | POA: Diagnosis present

## 2020-01-14 DIAGNOSIS — U071 COVID-19: Principal | ICD-10-CM | POA: Diagnosis present

## 2020-01-14 DIAGNOSIS — J9690 Respiratory failure, unspecified, unspecified whether with hypoxia or hypercapnia: Secondary | ICD-10-CM | POA: Diagnosis present

## 2020-01-14 DIAGNOSIS — Z79899 Other long term (current) drug therapy: Secondary | ICD-10-CM | POA: Diagnosis not present

## 2020-01-14 DIAGNOSIS — Z8249 Family history of ischemic heart disease and other diseases of the circulatory system: Secondary | ICD-10-CM | POA: Diagnosis not present

## 2020-01-14 DIAGNOSIS — E7800 Pure hypercholesterolemia, unspecified: Secondary | ICD-10-CM | POA: Diagnosis present

## 2020-01-14 DIAGNOSIS — Z8673 Personal history of transient ischemic attack (TIA), and cerebral infarction without residual deficits: Secondary | ICD-10-CM | POA: Diagnosis not present

## 2020-01-14 DIAGNOSIS — R0902 Hypoxemia: Secondary | ICD-10-CM

## 2020-01-14 DIAGNOSIS — R4182 Altered mental status, unspecified: Secondary | ICD-10-CM | POA: Diagnosis present

## 2020-01-14 DIAGNOSIS — Z888 Allergy status to other drugs, medicaments and biological substances status: Secondary | ICD-10-CM | POA: Diagnosis not present

## 2020-01-14 LAB — CBC WITH DIFFERENTIAL/PLATELET
Abs Immature Granulocytes: 0.11 10*3/uL — ABNORMAL HIGH (ref 0.00–0.07)
Basophils Absolute: 0 10*3/uL (ref 0.0–0.1)
Basophils Relative: 0 %
Eosinophils Absolute: 0 10*3/uL (ref 0.0–0.5)
Eosinophils Relative: 0 %
HCT: 31.5 % — ABNORMAL LOW (ref 39.0–52.0)
Hemoglobin: 10.4 g/dL — ABNORMAL LOW (ref 13.0–17.0)
Immature Granulocytes: 2 %
Lymphocytes Relative: 4 %
Lymphs Abs: 0.3 10*3/uL — ABNORMAL LOW (ref 0.7–4.0)
MCH: 29.8 pg (ref 26.0–34.0)
MCHC: 33 g/dL (ref 30.0–36.0)
MCV: 90.3 fL (ref 80.0–100.0)
Monocytes Absolute: 0.4 10*3/uL (ref 0.1–1.0)
Monocytes Relative: 6 %
Neutro Abs: 6 10*3/uL (ref 1.7–7.7)
Neutrophils Relative %: 88 %
Platelets: 401 10*3/uL — ABNORMAL HIGH (ref 150–400)
RBC: 3.49 MIL/uL — ABNORMAL LOW (ref 4.22–5.81)
RDW: 14 % (ref 11.5–15.5)
WBC: 6.9 10*3/uL (ref 4.0–10.5)
nRBC: 0 % (ref 0.0–0.2)

## 2020-01-14 LAB — CREATININE, SERUM
Creatinine, Ser: 1.06 mg/dL (ref 0.61–1.24)
GFR calc Af Amer: >60 mL/min (ref >60)
GFR calc non Af Amer: >60 mL/min (ref >60)

## 2020-01-14 LAB — CBC
HCT: 32.1 % — ABNORMAL LOW (ref 39.0–52.0)
Hemoglobin: 10.5 g/dL — ABNORMAL LOW (ref 13.0–17.0)
MCH: 30 pg (ref 26.0–34.0)
MCHC: 32.7 g/dL (ref 30.0–36.0)
MCV: 91.7 fL (ref 80.0–100.0)
Platelets: 373 10*3/uL (ref 150–400)
RBC: 3.5 MIL/uL — ABNORMAL LOW (ref 4.22–5.81)
RDW: 14.2 % (ref 11.5–15.5)
WBC: 6.6 10*3/uL (ref 4.0–10.5)
nRBC: 0 % (ref 0.0–0.2)

## 2020-01-14 LAB — COMPREHENSIVE METABOLIC PANEL
ALT: 16 U/L (ref 0–44)
AST: 21 U/L (ref 15–41)
Albumin: 2.6 g/dL — ABNORMAL LOW (ref 3.5–5.0)
Alkaline Phosphatase: 78 U/L (ref 38–126)
Anion gap: 10 (ref 5–15)
BUN: 21 mg/dL (ref 8–23)
CO2: 23 mmol/L (ref 22–32)
Calcium: 8.6 mg/dL — ABNORMAL LOW (ref 8.9–10.3)
Chloride: 109 mmol/L (ref 98–111)
Creatinine, Ser: 1.05 mg/dL (ref 0.61–1.24)
GFR calc Af Amer: >60 mL/min (ref >60)
GFR calc non Af Amer: >60 mL/min (ref >60)
Glucose, Bld: 104 mg/dL — ABNORMAL HIGH (ref 70–99)
Potassium: 3.2 mmol/L — ABNORMAL LOW (ref 3.5–5.1)
Sodium: 142 mmol/L (ref 135–145)
Total Bilirubin: 0.4 mg/dL (ref 0.3–1.2)
Total Protein: 6.4 g/dL — ABNORMAL LOW (ref 6.5–8.1)

## 2020-01-14 LAB — TRIGLYCERIDES: Triglycerides: 68 mg/dL (ref <150)

## 2020-01-14 LAB — C-REACTIVE PROTEIN: CRP: 11.1 mg/dL — ABNORMAL HIGH (ref <1.0)

## 2020-01-14 LAB — PROCALCITONIN: Procalcitonin: <0.1 ng/mL

## 2020-01-14 LAB — FERRITIN: Ferritin: 334 ng/mL (ref 24–336)

## 2020-01-14 LAB — LACTATE DEHYDROGENASE: LDH: 149 U/L (ref 98–192)

## 2020-01-14 LAB — D-DIMER, QUANTITATIVE: D-Dimer, Quant: 2.76 ug/mL-FEU — ABNORMAL HIGH (ref 0.00–0.50)

## 2020-01-14 LAB — LACTIC ACID, PLASMA: Lactic Acid, Venous: 1 mmol/L (ref 0.5–1.9)

## 2020-01-14 LAB — FIBRINOGEN: Fibrinogen: 715 mg/dL — ABNORMAL HIGH (ref 210–475)

## 2020-01-14 MED ORDER — ZINC SULFATE 220 (50 ZN) MG PO CAPS
220.0000 mg | ORAL_CAPSULE | Freq: Every day | ORAL | Status: DC
  Administered 2020-01-14 – 2020-01-18 (×4): 220 mg via ORAL
  Filled 2020-01-14 (×4): qty 1

## 2020-01-14 MED ORDER — VANCOMYCIN HCL IN DEXTROSE 1-5 GM/200ML-% IV SOLN
1000.0000 mg | Freq: Once | INTRAVENOUS | Status: AC
  Administered 2020-01-14: 1000 mg via INTRAVENOUS
  Filled 2020-01-14: qty 200

## 2020-01-14 MED ORDER — ONDANSETRON HCL 4 MG/2ML IJ SOLN: 4.0000 mg | Freq: Four times a day (QID) | INTRAMUSCULAR | Status: DC | PRN

## 2020-01-14 MED ORDER — IOHEXOL 350 MG/ML SOLN
80.0000 mL | Freq: Once | INTRAVENOUS | Status: AC | PRN
  Administered 2020-01-14: 80 mL via INTRAVENOUS

## 2020-01-14 MED ORDER — ALBUTEROL SULFATE HFA 108 (90 BASE) MCG/ACT IN AERS
2.0000 | INHALATION_SPRAY | Freq: Four times a day (QID) | RESPIRATORY_TRACT | Status: DC
  Administered 2020-01-14 – 2020-01-16 (×9): 2 via RESPIRATORY_TRACT
  Filled 2020-01-14: qty 6.7

## 2020-01-14 MED ORDER — DEXAMETHASONE SODIUM PHOSPHATE 10 MG/ML IJ SOLN
6.0000 mg | INTRAMUSCULAR | Status: DC
  Administered 2020-01-14 – 2020-01-17 (×4): 6 mg via INTRAVENOUS
  Filled 2020-01-14 (×4): qty 1

## 2020-01-14 MED ORDER — ONDANSETRON HCL 4 MG PO TABS: 4.0000 mg | ORAL_TABLET | Freq: Four times a day (QID) | ORAL | Status: DC | PRN

## 2020-01-14 MED ORDER — SODIUM CHLORIDE (PF) 0.9 % IJ SOLN
INTRAMUSCULAR | Status: AC
  Filled 2020-01-14: qty 50

## 2020-01-14 MED ORDER — ACETAMINOPHEN 500 MG PO TABS
1000.0000 mg | ORAL_TABLET | Freq: Once | ORAL | Status: DC
  Filled 2020-01-14: qty 2

## 2020-01-14 MED ORDER — ACETAMINOPHEN 650 MG RE SUPP
650.0000 mg | Freq: Once | RECTAL | Status: AC
  Administered 2020-01-14: 650 mg via RECTAL
  Filled 2020-01-14: qty 1

## 2020-01-14 MED ORDER — ENOXAPARIN SODIUM 40 MG/0.4ML ~~LOC~~ SOLN
40.0000 mg | Freq: Two times a day (BID) | SUBCUTANEOUS | Status: DC
  Administered 2020-01-14: 40 mg via SUBCUTANEOUS
  Filled 2020-01-14: qty 0.4

## 2020-01-14 MED ORDER — SODIUM CHLORIDE 0.9 % IV SOLN
2.0000 g | Freq: Once | INTRAVENOUS | Status: AC
  Administered 2020-01-14: 17:00:00 2 g via INTRAVENOUS
  Filled 2020-01-14: qty 2

## 2020-01-14 MED ORDER — ACETAMINOPHEN 325 MG PO TABS: 650.0000 mg | ORAL_TABLET | Freq: Four times a day (QID) | ORAL | Status: DC | PRN

## 2020-01-14 MED ORDER — SODIUM CHLORIDE 0.9 % IV SOLN
500.0000 mg | INTRAVENOUS | Status: DC
  Administered 2020-01-15 – 2020-01-17 (×3): 500 mg via INTRAVENOUS
  Filled 2020-01-14 (×3): qty 500

## 2020-01-14 MED ORDER — ASCORBIC ACID 500 MG PO TABS
500.0000 mg | ORAL_TABLET | Freq: Every day | ORAL | Status: DC
  Administered 2020-01-14 – 2020-01-18 (×4): 500 mg via ORAL
  Filled 2020-01-14 (×4): qty 1

## 2020-01-14 MED ORDER — SODIUM CHLORIDE 0.9 % IV SOLN
1.0000 g | INTRAVENOUS | Status: DC
  Administered 2020-01-15 – 2020-01-18 (×4): 1 g via INTRAVENOUS
  Filled 2020-01-14 (×4): qty 1

## 2020-01-14 MED ORDER — SODIUM CHLORIDE 0.9 % IV BOLUS
1000.0000 mL | Freq: Once | INTRAVENOUS | Status: AC
  Administered 2020-01-14: 1000 mL via INTRAVENOUS

## 2020-01-14 NOTE — ED Notes (Signed)
Daughter [name] [phone] asks to be updated once in admit room please

## 2020-01-14 NOTE — ED Provider Notes (Addendum)
[facility] DEPT Provider Note   CSN: [id number] Arrival date & time: 01/14/20  1536     History Chief Complaint  Patient presents with  . Covid Positive  . Weakness  . Fever  . Cough    Quentin Hayes is a 85 y.o. male hx of dementia, depression, TIA, GERD, HTN, here presenting with fever, weakness, pneumonia . Patient was recently diagnosed with Covid and was in the hospital and finished a course of Decadron and redesimir and was sent to [facility]. Patient was was noted to have fever for the last 2 days and worsening weakness . Patient had a chest x-ray at the facility that showed right upper lobe pneumonia . Patient is sent here because he is more altered and persistently febrile.  The history is provided by the patient.       Past Medical History:  Diagnosis Date  . Arthritis    "joints" (10/15/2017)  . Benign prostatic hyperplasia   . Complication of anesthesia    "last OR in 06/2017 affected him cognitively; hasn't got back to baseline yet" (10/15/2017)  . Dementia ([place])   . Depression   . GERD (gastroesophageal reflux disease)   . High cholesterol   . Hypertension   . Hypothyroidism   . Renal disease   . Renal disorder   . TIA (transient ischemic attack) early 2000s    Patient Active Problem List   Diagnosis Date Noted  . COVID-19 virus infection 12/19/2019  . Protein-calorie malnutrition, severe ([place]) 12/19/2019  . HCAP (healthcare-associated pneumonia) 11/22/2018  . Fall 11/22/2018  . Chronic diastolic CHF (congestive heart failure) ([place]) 11/22/2018  . Hypoxia   . Bradycardia 10/28/2018  . Leg swelling 10/28/2018  . Irregular heart beat 09/08/2018  . Sleep disturbance   . Stage 3 chronic kidney disease   . Benign essential HTN   . Hypokalemia   . Hyponatremia   . Infarction of right basal ganglia ([place]) 04/06/2018  . Pressure injury of skin 04/06/2018  . Vitamin B12 deficiency   . Hypothyroidism   .  Crohn's disease with complication ([place])   . Benign prostatic hyperplasia   . Acute lower UTI   . Dementia without behavioral disturbance ([place])   . Acute ischemic stroke ([place]) 04/05/2018  . Leukocytosis   . Shortness of breath   . Acute encephalopathy   . CVA (cerebral vascular accident) ([place]) 04/03/2018  . Calculus, kidney 01/21/2018  . Acute blood loss anemia   . Mallory-Weiss syndrome   . Esophageal stricture   . Respiratory failure ([place])   . UGIB (upper gastrointestinal bleed) 10/16/2017  . GERD (gastroesophageal reflux disease) 10/16/2017  . Essential hypertension, benign 10/16/2017  . Chest pain 10/15/2017  . Hypothyroidism (acquired) 06/08/2017  . Chronic kidney disease, stage III (moderate) 03/19/2016  . Dyslipidemia 03/19/2016  . Degeneration of lumbar or lumbosacral intervertebral disc 07/10/2008  . Arthropathy, lower leg 05/23/2004    Past Surgical History:  Procedure Laterality Date  . ABDOMINAL EXPLORATION SURGERY  7/  . APPENDECTOMY    . CATARACT EXTRACTION W/ INTRAOCULAR LENS  IMPLANT, BILATERAL Bilateral   . COLECTOMY  1950s   "thought he had iteilis"  . ESOPHAGOGASTRODUODENOSCOPY N/A 10/22/2017   Procedure: ESOPHAGOGASTRODUODENOSCOPY (EGD);  Surgeon: Gatha Mayer, MD;  Location: [facility] ENDOSCOPY;  Service: Endoscopy;  Laterality: N/A;  . ESOPHAGOGASTRODUODENOSCOPY (EGD) WITH PROPOFOL N/A 10/16/2017   Procedure: ESOPHAGOGASTRODUODENOSCOPY (EGD) WITH PROPOFOL;  Surgeon: Milus Banister, MD;  Location: [facility];  Service:  Endoscopy;  Laterality: N/A;  . FEMUR FRACTURE SURGERY Right 1950s  . FRACTURE SURGERY    . KNEE ARTHROSCOPY Left   . POSTERIOR LUMBAR FUSION    . ROTATOR CUFF REPAIR Left   . TONSILLECTOMY         Family History  Problem Relation Age of Onset  . Hypertension Father     Social History   Tobacco Use  . Smoking status: Former Smoker    Years: 7.00    Types: Cigarettes    Quit date: 1963    Years since quitting: 58.0  .  Smokeless tobacco: Never Used  Substance Use Topics  . Alcohol use: Yes    Comment: 10/15/2017 "a few drinks/year"  . Drug use: No    Home Medications Prior to Admission medications   Medication Sig Start Date End Date Taking? Authorizing Provider  acetaminophen (TYLENOL) 325 MG tablet Take 650 mg by mouth every 6 (six) hours as needed for mild pain.    [provider]  amLODipine (NORVASC) 5 MG tablet Take 1 tablet (5 mg total) by mouth daily. 11/26/18   Caren Griffins, MD  Cholecalciferol (VITAMIN D3) 25 MCG (1000 UT) CAPS Take 1,000 Units by mouth daily.    [provider]  clopidogrel (PLAVIX) 75 MG tablet Take 1 tablet (75 mg total) by mouth daily. 11/29/18   Minus Breeding, MD  cyanocobalamin (,VITAMIN B-12,) 1000 MCG/ML injection Inject 1,000 mcg into the muscle every 30 (thirty) days.    [provider]  diphenhydrAMINE (BENADRYL) 12.5 MG/5ML elixir Take 12.5 mg by mouth 2 (two) times daily. Ends 1.1.2021    [provider]  hydrALAZINE (APRESOLINE) 25 MG tablet Take 25 mg by mouth 3 (three) times daily.    [provider]  levothyroxine (SYNTHROID) 50 MCG tablet Take 50 mcg by mouth daily before breakfast.    [provider]  Melatonin 3 MG SUBL Place 6 mg under the tongue at bedtime.    [provider]  pantoprazole (PROTONIX) 40 MG tablet Take 1 tablet (40 mg total) by mouth daily. 04/05/18   Dhungel, Flonnie Overman, MD  pravastatin (PRAVACHOL) 10 MG tablet Take 1 tablet (10 mg total) by mouth daily. 06/06/18   Frann Rider, NP  sertraline (ZOLOFT) 100 MG tablet Take 1 tablet by mouth daily. 08/17/18   [provider]  sertraline (ZOLOFT) 25 MG tablet Take 25 mg by mouth daily. Take with 160m zoloft to = 1279m   [provider]    Allergies    Lisinopril  Review of Systems   Review of Systems  Constitutional: Positive for fever.  Respiratory: Positive for cough.   Neurological: Positive for  weakness.  All other systems reviewed and are negative.   Physical Exam Updated Vital Signs BP (!) 114/53   Pulse (!) 57   Temp (!) 102.2 F (39 C) (Oral)   Resp 20   SpO2 99%   Physical Exam Vitals and nursing note reviewed.  Constitutional:      Comments: Chronically ill, demented, nonverbal   HENT:     Nose: Nose normal.     Mouth/Throat:     Mouth: Mucous membranes are dry.  Eyes:     Extraocular Movements: Extraocular movements intact.     Pupils: Pupils are equal, round, and reactive to light.  Cardiovascular:     Rate and Rhythm: Normal rate and regular rhythm.     Pulses: Normal pulses.     Heart sounds: Normal  heart sounds.  Pulmonary:     Comments: Crackles R base  Abdominal:     General: Abdomen is flat.     Palpations: Abdomen is soft.  Musculoskeletal:        General: Normal range of motion.     Cervical back: Normal range of motion.  Skin:    General: Skin is warm.     Capillary Refill: Capillary refill takes less than 2 seconds.  Neurological:     Comments: Confused, nonverbal.   Psychiatric:     Comments: Unable      ED Results / Procedures / Treatments   Labs (all labs ordered are listed, but only abnormal results are displayed) Labs Reviewed  CBC WITH DIFFERENTIAL/PLATELET - Abnormal; Notable for the following components:      Result Value   RBC 3.49 (*)    Hemoglobin 10.4 (*)    HCT 31.5 (*)    Platelets 401 (*)    Lymphs Abs 0.3 (*)    Abs Immature Granulocytes 0.11 (*)    All other components within normal limits  COMPREHENSIVE METABOLIC PANEL - Abnormal; Notable for the following components:   Potassium 3.2 (*)    Glucose, Bld 104 (*)    Calcium 8.6 (*)    Total Protein 6.4 (*)    Albumin 2.6 (*)    All other components within normal limits  D-DIMER, QUANTITATIVE (NOT AT [facility]) - Abnormal; Notable for the following components:   D-Dimer, Quant 2.76 (*)    All other components within normal limits  FIBRINOGEN - Abnormal; Notable  for the following components:   Fibrinogen 715 (*)    All other components within normal limits  C-REACTIVE PROTEIN - Abnormal; Notable for the following components:   CRP 11.1 (*)    All other components within normal limits  CULTURE, BLOOD (ROUTINE X 2)  CULTURE, BLOOD (ROUTINE X 2)  PROCALCITONIN  LACTATE DEHYDROGENASE  FERRITIN  TRIGLYCERIDES  LACTIC ACID, PLASMA  LACTIC ACID, PLASMA    EKG EKG Interpretation  Date/Time:  Sunday January 14 2020 16:21:38 EST Ventricular Rate:  60 PR Interval:    QRS Duration: 149 QT Interval:  531 QTC Calculation: 531 R Axis:   -82 Text Interpretation: Sinus rhythm Nonspecific IVCD with LAD Anteroseptal infarct, age indeterminate Lateral leads are also involved No significant change since last tracing Confirmed by Wandra Arthurs [phone]) on 01/14/2020 4:26:28 PM   Radiology CT Angio Chest PE W and/or Wo Contrast  Result Date: 01/14/2020 CLINICAL DATA:  85 year old male with shortness of breath. Elevated D-dimer. EXAM: CT ANGIOGRAPHY CHEST WITH CONTRAST TECHNIQUE: Multidetector CT imaging of the chest was performed using the standard protocol during bolus administration of intravenous contrast. Multiplanar CT image reconstructions and MIPs were obtained to evaluate the vascular anatomy. CONTRAST:  80m OMNIPAQUE IOHEXOL 350 MG/ML SOLN COMPARISON:  Chest CT dated 12/19/2019. FINDINGS: Evaluation of this exam is limited due to respiratory motion artifact. Cardiovascular: There is mild cardiomegaly. No pericardial effusion. Three-vessel coronary vascular calcifications noted. There is mild atherosclerotic calcification of the thoracic aorta. No aneurysmal dilatation or dissection. The origins of the great vessels of the aortic arch appear patent as visualized. Evaluation of the pulmonary arteries is limited due to respiratory motion artifact. No large or central pulmonary artery embolus identified. Mediastinum/Nodes: There is no hilar or mediastinal  adenopathy. There is a small hiatal hernia. The esophagus is grossly unremarkable. No mediastinal fluid collection. Lungs/Pleura: Bilateral peripheral and subpleural clusters of ground-glass and hazy airspace  densities most consistent with multifocal pneumonia likely viral or atypical etiology including COVID-19. Clinical correlation and follow-up recommended. There is no pleural effusion or pneumothorax. The central airways are patent. Upper Abdomen: No acute abnormality. Musculoskeletal: Osteopenia with degenerative changes of the spine. No acute osseous pathology. Review of the MIP images confirms the above findings. IMPRESSION: 1. No CT evidence of central pulmonary artery embolus. 2. Multifocal pneumonia, likely viral or atypical in etiology including COVID-19. Clinical correlation is recommended. Electronically Signed   By: Anner Crete M.D.   On: 01/14/2020 18:16   DG Chest Port 1 View  Result Date: 01/14/2020 CLINICAL DATA:  Fever, cough and low oxygen saturation. COVID positive patient. EXAM: PORTABLE CHEST 1 VIEW COMPARISON:  December 22, 2019 FINDINGS: Enlarged cardiac silhouette. Calcific atherosclerotic disease and tortuosity of the aorta. Mild patchy airspace consolidation in the left lung base. Improvement in the previously demonstrated bilateral upper lobe peripheral consolidation. Osseous structures are without acute abnormality. Soft tissues are grossly normal. IMPRESSION: 1. Mild patchy airspace consolidation in the left lung base. 2. Improvement in the previously demonstrated bilateral upper lobe peripheral consolidation. 3. Enlarged cardiac silhouette. Electronically Signed   By: Fidela Salisbury M.D.   On: 01/14/2020 16:32    Procedures Procedures (including critical care time)  CRITICAL CARE Performed by: Wandra Arthurs   Total critical care time: 30 minutes  Critical care time was exclusive of separately billable procedures and treating other patients.  Critical care was  necessary to treat or prevent imminent or life-threatening deterioration.  Critical care was time spent personally by me on the following activities: development of treatment plan with patient and/or surrogate as well as nursing, discussions with consultants, evaluation of patient's response to treatment, examination of patient, obtaining history from patient or surrogate, ordering and performing treatments and interventions, ordering and review of laboratory studies, ordering and review of radiographic studies, pulse oximetry and re-evaluation of patient's condition.   Medications Ordered in ED Medications  sodium chloride (PF) 0.9 % injection (has no administration in time range)  sodium chloride 0.9 % bolus 1,000 mL (0 mLs Intravenous Stopped 01/14/20 1754)  acetaminophen (TYLENOL) suppository 650 mg (650 mg Rectal Given 01/14/20 1652)  vancomycin (VANCOCIN) IVPB 1000 mg/200 mL premix (1,000 mg Intravenous New Bag/Given 01/14/20 1655)  ceFEPIme (MAXIPIME) 2 g in sodium chloride 0.9 % 100 mL IVPB (0 g Intravenous Stopped 01/14/20 1731)  iohexol (OMNIPAQUE) 350 MG/ML injection 80 mL (80 mLs Intravenous Contrast Given 01/14/20 1801)    ED Course  I have reviewed the triage vital signs and the nursing notes.  Pertinent labs & imaging results that were available during my care of the patient were reviewed by me and considered in my medical decision making (see chart for details).    MDM Rules/Calculators/A&P                      Ashton Belote is a 85 y.o. male who presented with fever and cough and weakness . Patient is febrile in the ED and appears very altered and confused. Recently was diagnosed with Covid. CXR this morning showed pneumonia. Will get [facility] preadmission labs and sepsis workup. Will give IV abx for HCAP. Will need admission since patient is not taking PO.   6:23 PM CXR showed pneumonia. Required 2 L [place] currently. Inflammatory markers elevated. D-dimer very elevated but CTA showed  no PE. Given IV abx. Given new oxygen requirement and poor PO intake, will admit for hydration, IV  abx, hypoxia likely from Covid and pneumonia.  Final Clinical Impression(s) / ED Diagnoses Final diagnoses:  None    Rx / DC Orders ED Discharge Orders    None       Drenda Freeze, MD 01/14/20 [place]    Drenda Freeze, MD 01/14/20 Bosie Helper

## 2020-01-14 NOTE — ED Triage Notes (Signed)
EMS reports from [facility], Low SATs, fever, cough and generalized weakness at facility x 2 days, Dx with PNA this morning, COVID + recently, and COVID in December as well.  BP 140/80 HR 70 RR 26 Sp02 96 on 2 Ltrs Temp 102.2 CBG 98

## 2020-01-15 DIAGNOSIS — U071 COVID-19: Principal | ICD-10-CM

## 2020-01-15 DIAGNOSIS — J1282 Pneumonia due to coronavirus disease 2019: Secondary | ICD-10-CM

## 2020-01-15 DIAGNOSIS — R4182 Altered mental status, unspecified: Secondary | ICD-10-CM | POA: Diagnosis present

## 2020-01-15 LAB — CBC WITH DIFFERENTIAL/PLATELET
Abs Immature Granulocytes: 0.03 10*3/uL (ref 0.00–0.07)
Basophils Absolute: 0 10*3/uL (ref 0.0–0.1)
Basophils Relative: 0 %
Eosinophils Absolute: 0 10*3/uL (ref 0.0–0.5)
Eosinophils Relative: 0 %
HCT: 29.7 % — ABNORMAL LOW (ref 39.0–52.0)
Hemoglobin: 9.5 g/dL — ABNORMAL LOW (ref 13.0–17.0)
Immature Granulocytes: 1 %
Lymphocytes Relative: 6 %
Lymphs Abs: 0.3 10*3/uL — ABNORMAL LOW (ref 0.7–4.0)
MCH: 29.7 pg (ref 26.0–34.0)
MCHC: 32 g/dL (ref 30.0–36.0)
MCV: 92.8 fL (ref 80.0–100.0)
Monocytes Absolute: 0.2 10*3/uL (ref 0.1–1.0)
Monocytes Relative: 3 %
Neutro Abs: 5 10*3/uL (ref 1.7–7.7)
Neutrophils Relative %: 90 %
Platelets: 325 10*3/uL (ref 150–400)
RBC: 3.2 MIL/uL — ABNORMAL LOW (ref 4.22–5.81)
RDW: 13.9 % (ref 11.5–15.5)
WBC: 5.6 10*3/uL (ref 4.0–10.5)
nRBC: 0 % (ref 0.0–0.2)

## 2020-01-15 LAB — COMPREHENSIVE METABOLIC PANEL
ALT: 17 U/L (ref 0–44)
AST: 21 U/L (ref 15–41)
Albumin: 2.5 g/dL — ABNORMAL LOW (ref 3.5–5.0)
Alkaline Phosphatase: 67 U/L (ref 38–126)
Anion gap: 9 (ref 5–15)
BUN: 24 mg/dL — ABNORMAL HIGH (ref 8–23)
CO2: 21 mmol/L — ABNORMAL LOW (ref 22–32)
Calcium: 8.7 mg/dL — ABNORMAL LOW (ref 8.9–10.3)
Chloride: 110 mmol/L (ref 98–111)
Creatinine, Ser: 0.89 mg/dL (ref 0.61–1.24)
GFR calc Af Amer: >60 mL/min (ref >60)
GFR calc non Af Amer: >60 mL/min (ref >60)
Glucose, Bld: 124 mg/dL — ABNORMAL HIGH (ref 70–99)
Potassium: 3.3 mmol/L — ABNORMAL LOW (ref 3.5–5.1)
Sodium: 140 mmol/L (ref 135–145)
Total Bilirubin: 0.7 mg/dL (ref 0.3–1.2)
Total Protein: 6 g/dL — ABNORMAL LOW (ref 6.5–8.1)

## 2020-01-15 LAB — LACTIC ACID, PLASMA: Lactic Acid, Venous: 0.9 mmol/L (ref 0.5–1.9)

## 2020-01-15 LAB — ABO/RH: ABO/RH(D): O POS

## 2020-01-15 MED ORDER — SODIUM CHLORIDE 0.9 % IV SOLN
INTRAVENOUS | Status: DC | PRN
  Administered 2020-01-15: 250 mL via INTRAVENOUS
  Administered 2020-01-15: 500 mL via INTRAVENOUS

## 2020-01-15 MED ORDER — POTASSIUM CHLORIDE 10 MEQ/100ML IV SOLN
10.0000 meq | INTRAVENOUS | Status: AC
  Administered 2020-01-15 (×4): 10 meq via INTRAVENOUS
  Filled 2020-01-15 (×4): qty 100

## 2020-01-15 MED ORDER — ENOXAPARIN SODIUM 40 MG/0.4ML ~~LOC~~ SOLN
40.0000 mg | Freq: Every day | SUBCUTANEOUS | Status: DC
  Administered 2020-01-15 – 2020-01-17 (×3): 40 mg via SUBCUTANEOUS
  Filled 2020-01-15 (×3): qty 0.4

## 2020-01-15 MED ORDER — POTASSIUM CHLORIDE 10 MEQ/100ML IV SOLN
10.0000 meq | INTRAVENOUS | Status: AC
  Administered 2020-01-15 (×3): 10 meq via INTRAVENOUS
  Filled 2020-01-15 (×3): qty 100

## 2020-01-15 MED ORDER — POTASSIUM CHLORIDE 10 MEQ/50ML IV SOLN
10.0000 meq | INTRAVENOUS | Status: DC
  Filled 2020-01-15 (×3): qty 50

## 2020-01-15 NOTE — H&P (Signed)
History and Physical    Kaz Auld FSE:[id number] DOB: May 15, 1935 DOA: 01/14/2020  PCP: Etter Sjogren, DO (Confirm with patient/family/NH records and if not entered, this has to be entered at [facility] point of entry) Patient coming from: Assisted living facility  I have personally briefly reviewed patient's old medical records in [place]  Chief Complaint: Fever, cough and weakness  HPI: Tarvis Blossom is a 85 y.o. male with medical history significant of dementia, hypertension, hyperlipidemia and arthritis ED from assisted living facility for evaluation of fever cough and generalized weakness patient was diagnosed with COVID-19 infection in December and was managed with remdesivir and IV prednisone and was discharged to assisted living facility.  According to the EMS patient started having fever cough and generalized weakness for the last 2 days and he also become altered from his baseline.  During my evaluation patient is awake and alert but minimally oriented to time person and place.  Patient states that he is feeling very weak and having difficulty with breathing but denies any other symptoms.  ED Course: Arrival to ED patient had fever of 102.2, blood pressure 140/80, heart rate 70, respiratory rate 26 and oxygen saturation 96% on room air.  Blood work showed high LDH, high ferritin and high CRP level.  D-dimer was also elevated and fibrinogen was also elevated.  Chest x-ray showed multifocal pneumonia likely viral from COVID-19.  CT chest PE angiogram was done and it was negative for pulmonary embolism.  Patient was managed in ED with bolus of normal saline, IV vancomycin, IV cefepime and p.o. azithromycin.     NOTE: I discussed the patient case in detail with his daughter/power of attorney Ms. [name] philtop and she mentioned to me that patient is DNR.  Review of Systems: System could not be obtained because of patient's current condition  Past Medical History:  Diagnosis Date  .  Arthritis    "joints" (10/15/2017)  . Benign prostatic hyperplasia   . Complication of anesthesia    "last OR in 06/2017 affected him cognitively; hasn't got back to baseline yet" (10/15/2017)  . Dementia ([place])   . Depression   . GERD (gastroesophageal reflux disease)   . High cholesterol   . Hypertension   . Hypothyroidism   . Renal disease   . Renal disorder   . TIA (transient ischemic attack) early 2000s    Past Surgical History:  Procedure Laterality Date  . ABDOMINAL EXPLORATION SURGERY  7/  . APPENDECTOMY    . CATARACT EXTRACTION W/ INTRAOCULAR LENS  IMPLANT, BILATERAL Bilateral   . COLECTOMY  1950s   "thought he had iteilis"  . ESOPHAGOGASTRODUODENOSCOPY N/A 10/22/2017   Procedure: ESOPHAGOGASTRODUODENOSCOPY (EGD);  Surgeon: Gatha Mayer, MD;  Location: [facility] ENDOSCOPY;  Service: Endoscopy;  Laterality: N/A;  . ESOPHAGOGASTRODUODENOSCOPY (EGD) WITH PROPOFOL N/A 10/16/2017   Procedure: ESOPHAGOGASTRODUODENOSCOPY (EGD) WITH PROPOFOL;  Surgeon: Milus Banister, MD;  Location: [facility] ENDOSCOPY;  Service: Endoscopy;  Laterality: N/A;  . FEMUR FRACTURE SURGERY Right 1950s  . FRACTURE SURGERY    . KNEE ARTHROSCOPY Left   . POSTERIOR LUMBAR FUSION    . ROTATOR CUFF REPAIR Left   . TONSILLECTOMY       reports that he quit smoking about 58 years ago. His smoking use included cigarettes. He quit after 7.00 years of use. He has never used smokeless tobacco. He reports current alcohol use. He reports that he does not use drugs.  Allergies  Allergen Reactions  . Lisinopril Swelling  Swelling of lips     Family History  Problem Relation Age of Onset  . Hypertension Father      Prior to Admission medications   Medication Sig Start Date End Date Taking? Authorizing Provider  acetaminophen (TYLENOL) 325 MG tablet Take 650 mg by mouth every 6 (six) hours as needed for mild pain.   Yes [provider]  amLODipine (NORVASC) 5 MG tablet Take 1 tablet (5 mg total) by mouth  daily. 11/26/18  Yes Caren Griffins, MD  clopidogrel (PLAVIX) 75 MG tablet Take 1 tablet (75 mg total) by mouth daily. 11/29/18  Yes Minus Breeding, MD  cyanocobalamin (,VITAMIN B-12,) 1000 MCG/ML injection Inject 1,000 mcg into the muscle every 30 (thirty) days.   Yes [provider]  diphenhydrAMINE (BENADRYL) 12.5 MG/5ML elixir Take 12.5 mg by mouth 2 (two) times daily. Ends 1.1.2021   Yes [provider]  hydrALAZINE (APRESOLINE) 25 MG tablet Take 25 mg by mouth 3 (three) times daily.   Yes [provider]  levothyroxine (SYNTHROID) 50 MCG tablet Take 50 mcg by mouth daily before breakfast.   Yes [provider]  Melatonin 3 MG SUBL Place 6 mg under the tongue at bedtime.   Yes [provider]  pantoprazole (PROTONIX) 40 MG tablet Take 1 tablet (40 mg total) by mouth daily. 04/05/18  Yes Dhungel, Nishant, MD  pravastatin (PRAVACHOL) 10 MG tablet Take 1 tablet (10 mg total) by mouth daily. 06/06/18  Yes McCue, Janett Billow, NP  sertraline (ZOLOFT) 100 MG tablet Take 1 tablet by mouth daily. 08/17/18  Yes [provider]  sertraline (ZOLOFT) 25 MG tablet Take 25 mg by mouth daily. Take with 121m zoloft to = 1238m  Yes [provider]  Cholecalciferol (VITAMIN D3) 25 MCG (1000 UT) CAPS Take 1,000 Units by mouth daily.    [provider]    Physical Exam: Vitals:   01/14/20 2100 01/14/20 2130 01/14/20 2217 01/15/20 0451  BP: (!) 121/57 (!) 125/58 (!) 132/52 (!) 124/55  Pulse: (!) 52 (!) 50 (!) 52 (!) 46  Resp: 19 (!) 21 20 20   Temp:   100 F (37.8 C) (!) 97.5 F (36.4 C)  TempSrc:   Oral Oral  SpO2: 97% 97% 99% 98%  Weight:   72.3 kg   Height:   5' 8"  (1.727 m)     Constitutional: NAD, calm, comfortable Vitals:   01/14/20 2100 01/14/20 2130 01/14/20 2217 01/15/20 0451  BP: (!) 121/57 (!) 125/58 (!) 132/52 (!) 124/55  Pulse: (!) 52 (!) 50 (!) 52 (!) 46  Resp: 19 (!) 21 20 20   Temp:   100 F (37.8 C) (!) 97.5 F  (36.4 C)  TempSrc:   Oral Oral  SpO2: 97% 97% 99% 98%  Weight:   72.3 kg   Height:   5' 8"  (1.727 m)    General: [age]ear old Caucasian male, awake and alert but not oriented to time person and place.  Patient is on 2 L of oxygen with nasal cannula Eyes: PERRL, lids and conjunctivae normal ENMT: Mucous membranes are moist. Posterior pharynx clear of any exudate or lesions.Normal dentition.  Neck: normal, supple, no masses, no thyromegaly Respiratory: Patient on 2 L of oxygen with nasal cannula.  Diminished breath sounds in bilateral lung bases.  No wheezes rales or rhonchi appreciated on auscultation.   No accessory muscle use.  Cardiovascular: Regular rate and rhythm, no murmurs / rubs / gallops. No extremity edema. 2+ pedal pulses. No  carotid bruits.  Abdomen: no tenderness, no masses palpated. No hepatosplenomegaly. Bowel sounds positive.  Musculoskeletal: no clubbing / cyanosis. No joint deformity upper and lower extremities. Good ROM, no contractures. Normal muscle tone.  Skin: no rashes, lesions, ulcers. No induration Neurologic: Patient is awake and alert but not oriented to time person and place.  Speech is very slow.  CN 2-12 grossly intact. Sensation intact, DTR normal. Strength 4/5 in all 4.  Psychiatric: Patient is awake and alert but still confused unable to answer the questions appropriately..    Labs on Admission: I have personally reviewed following labs and imaging studies  CBC: Recent Labs  Lab 01/14/20 1553 01/14/20 2200  WBC 6.9 6.6  NEUTROABS 6.0  --   HGB 10.4* 10.5*  HCT 31.5* 32.1*  MCV 90.3 91.7  PLT 401* 432   Basic Metabolic Panel: Recent Labs  Lab 01/14/20 1553 01/14/20 2200  NA 142  --   K 3.2*  --   CL 109  --   CO2 23  --   GLUCOSE 104*  --   BUN 21  --   CREATININE 1.05 1.06  CALCIUM 8.6*  --    GFR: Estimated Creatinine Clearance: 52 mL/min (by C-G formula based on SCr of 1.06 mg/dL). Liver Function Tests: Recent Labs  Lab  01/14/20 1553  AST 21  ALT 16  ALKPHOS 78  BILITOT 0.4  PROT 6.4*  ALBUMIN 2.6*   No results for input(s): LIPASE, AMYLASE in the last 168 hours. No results for input(s): AMMONIA in the last 168 hours. Coagulation Profile: No results for input(s): INR, PROTIME in the last 168 hours. Cardiac Enzymes: No results for input(s): CKTOTAL, CKMB, CKMBINDEX, TROPONINI in the last 168 hours. BNP (last 3 results) No results for input(s): PROBNP in the last 8760 hours. HbA1C: No results for input(s): HGBA1C in the last 72 hours. CBG: No results for input(s): GLUCAP in the last 168 hours. Lipid Profile: Recent Labs    01/14/20 1553  TRIG 68   Thyroid Function Tests: No results for input(s): TSH, T4TOTAL, FREET4, T3FREE, THYROIDAB in the last 72 hours. Anemia Panel: Recent Labs    01/14/20 1553  FERRITIN 334   Urine analysis:    Component Value Date/Time   COLORURINE AMBER (A) 12/19/2019 1925   APPEARANCEUR CLEAR 12/19/2019 1925   LABSPEC 1.026 12/19/2019 1925   PHURINE 5.0 12/19/2019 1925   GLUCOSEU NEGATIVE 12/19/2019 1925   HGBUR NEGATIVE 12/19/2019 1925   BILIRUBINUR NEGATIVE 12/19/2019 1925   BILIRUBINUR NEG 01/21/2018 2022   KETONESUR NEGATIVE 12/19/2019 1925   PROTEINUR 100 (A) 12/19/2019 1925   UROBILINOGEN 1.0 01/21/2018 2022   NITRITE NEGATIVE 12/19/2019 1925   LEUKOCYTESUR MODERATE (A) 12/19/2019 1925    Radiological Exams on Admission: CT Angio Chest PE W and/or Wo Contrast  Result Date: 01/14/2020 CLINICAL DATA:  85 year old male with shortness of breath. Elevated D-dimer. EXAM: CT ANGIOGRAPHY CHEST WITH CONTRAST TECHNIQUE: Multidetector CT imaging of the chest was performed using the standard protocol during bolus administration of intravenous contrast. Multiplanar CT image reconstructions and MIPs were obtained to evaluate the vascular anatomy. CONTRAST:  61m OMNIPAQUE IOHEXOL 350 MG/ML SOLN COMPARISON:  Chest CT dated 12/19/2019. FINDINGS: Evaluation of this  exam is limited due to respiratory motion artifact. Cardiovascular: There is mild cardiomegaly. No pericardial effusion. Three-vessel coronary vascular calcifications noted. There is mild atherosclerotic calcification of the thoracic aorta. No aneurysmal dilatation or dissection. The origins of the great vessels of the aortic arch appear  patent as visualized. Evaluation of the pulmonary arteries is limited due to respiratory motion artifact. No large or central pulmonary artery embolus identified. Mediastinum/Nodes: There is no hilar or mediastinal adenopathy. There is a small hiatal hernia. The esophagus is grossly unremarkable. No mediastinal fluid collection. Lungs/Pleura: Bilateral peripheral and subpleural clusters of ground-glass and hazy airspace densities most consistent with multifocal pneumonia likely viral or atypical etiology including COVID-19. Clinical correlation and follow-up recommended. There is no pleural effusion or pneumothorax. The central airways are patent. Upper Abdomen: No acute abnormality. Musculoskeletal: Osteopenia with degenerative changes of the spine. No acute osseous pathology. Review of the MIP images confirms the above findings. IMPRESSION: 1. No CT evidence of central pulmonary artery embolus. 2. Multifocal pneumonia, likely viral or atypical in etiology including COVID-19. Clinical correlation is recommended. Electronically Signed   By: Anner Crete M.D.   On: 01/14/2020 18:16   DG Chest Port 1 View  Result Date: 01/14/2020 CLINICAL DATA:  Fever, cough and low oxygen saturation. COVID positive patient. EXAM: PORTABLE CHEST 1 VIEW COMPARISON:  December 22, 2019 FINDINGS: Enlarged cardiac silhouette. Calcific atherosclerotic disease and tortuosity of the aorta. Mild patchy airspace consolidation in the left lung base. Improvement in the previously demonstrated bilateral upper lobe peripheral consolidation. Osseous structures are without acute abnormality. Soft tissues are  grossly normal. IMPRESSION: 1. Mild patchy airspace consolidation in the left lung base. 2. Improvement in the previously demonstrated bilateral upper lobe peripheral consolidation. 3. Enlarged cardiac silhouette. Electronically Signed   By: Fidela Salisbury M.D.   On: 01/14/2020 16:32    Assessment/Plan Principal Problem:   Pneumonia due to COVID-19 virus Continue oxygen supplementation with nasal cannula. Continue IV ceftriaxone and IV azithromycin Continue IV Decadron 6 mg for 10 days Patient already completed remdesivir course recently and there is no guideline  for restarting remdesivir again .  Vitamin C and zinc supplementation CT chest angiogram was negative for pulmonary embolism and showed multifocal pneumonia. Droplet and contact precautions in place  Active Problems:   Respiratory failure ([place])  See above    Hypokalemia Potassium repletion ordered and continue to monitor potassium level.    Altered mental status Patient was severely confused on arrival to the hospital.  Patient's condition is much better now he is awake and alert but not oriented to time person and place.  Confusion is most likely secondary to pneumonia from Covid 19.      DVT prophylaxis: Lovenox Code Status: DNR Family Communication: I personally called patient's daughter/POA and updated her in detail about patient's situation. Admission status: Inpatient/MedSurg   Edmonia Lynch MD [facility] Pager  If 7PM-7AM, please contact night-coverage [email or web address] Password   01/15/2020, 5:21 AM

## 2020-01-15 NOTE — Progress Notes (Signed)
Pt daughter [name] updated on pts arrival to his room.

## 2020-01-16 LAB — CBC WITH DIFFERENTIAL/PLATELET
Abs Immature Granulocytes: 0.06 10*3/uL (ref 0.00–0.07)
Basophils Absolute: 0 10*3/uL (ref 0.0–0.1)
Basophils Relative: 0 %
Eosinophils Absolute: 0 10*3/uL (ref 0.0–0.5)
Eosinophils Relative: 0 %
HCT: 35.5 % — ABNORMAL LOW (ref 39.0–52.0)
Hemoglobin: 11.1 g/dL — ABNORMAL LOW (ref 13.0–17.0)
Immature Granulocytes: 1 %
Lymphocytes Relative: 8 %
Lymphs Abs: 0.6 10*3/uL — ABNORMAL LOW (ref 0.7–4.0)
MCH: 29.3 pg (ref 26.0–34.0)
MCHC: 31.3 g/dL (ref 30.0–36.0)
MCV: 93.7 fL (ref 80.0–100.0)
Monocytes Absolute: 0.2 10*3/uL (ref 0.1–1.0)
Monocytes Relative: 3 %
Neutro Abs: 6.2 10*3/uL (ref 1.7–7.7)
Neutrophils Relative %: 88 %
Platelets: 347 10*3/uL (ref 150–400)
RBC: 3.79 MIL/uL — ABNORMAL LOW (ref 4.22–5.81)
RDW: 13.8 % (ref 11.5–15.5)
WBC: 7.1 10*3/uL (ref 4.0–10.5)
nRBC: 0 % (ref 0.0–0.2)

## 2020-01-16 LAB — COMPREHENSIVE METABOLIC PANEL
ALT: 21 U/L (ref 0–44)
AST: 30 U/L (ref 15–41)
Albumin: 2.7 g/dL — ABNORMAL LOW (ref 3.5–5.0)
Alkaline Phosphatase: 75 U/L (ref 38–126)
Anion gap: 10 (ref 5–15)
BUN: 21 mg/dL (ref 8–23)
CO2: 22 mmol/L (ref 22–32)
Calcium: 9 mg/dL (ref 8.9–10.3)
Chloride: 107 mmol/L (ref 98–111)
Creatinine, Ser: 0.94 mg/dL (ref 0.61–1.24)
GFR calc Af Amer: >60 mL/min (ref >60)
GFR calc non Af Amer: >60 mL/min (ref >60)
Glucose, Bld: 143 mg/dL — ABNORMAL HIGH (ref 70–99)
Potassium: 4 mmol/L (ref 3.5–5.1)
Sodium: 139 mmol/L (ref 135–145)
Total Bilirubin: 0.4 mg/dL (ref 0.3–1.2)
Total Protein: 6.6 g/dL (ref 6.5–8.1)

## 2020-01-16 MED ORDER — LEVOTHYROXINE SODIUM 50 MCG PO TABS
50.0000 ug | ORAL_TABLET | Freq: Every day | ORAL | Status: DC
  Administered 2020-01-17 – 2020-01-18 (×2): 50 ug via ORAL
  Filled 2020-01-16 (×2): qty 1

## 2020-01-16 MED ORDER — SERTRALINE HCL 25 MG PO TABS: 25.0000 mg | ORAL_TABLET | Freq: Every day | ORAL | Status: DC

## 2020-01-16 MED ORDER — ACETAMINOPHEN 325 MG PO TABS: 650.0000 mg | ORAL_TABLET | Freq: Four times a day (QID) | ORAL | Status: DC | PRN

## 2020-01-16 MED ORDER — CLOPIDOGREL BISULFATE 75 MG PO TABS
75.0000 mg | ORAL_TABLET | Freq: Every day | ORAL | Status: DC
  Administered 2020-01-16 – 2020-01-18 (×3): 75 mg via ORAL
  Filled 2020-01-16 (×3): qty 1

## 2020-01-16 MED ORDER — MELATONIN 3 MG PO TABS
3.0000 mg | ORAL_TABLET | Freq: Every day | ORAL | Status: DC
  Administered 2020-01-16 – 2020-01-17 (×2): 3 mg via ORAL
  Filled 2020-01-16 (×2): qty 1

## 2020-01-16 MED ORDER — SERTRALINE HCL 25 MG PO TABS
125.0000 mg | ORAL_TABLET | Freq: Every day | ORAL | Status: DC
  Administered 2020-01-16 – 2020-01-18 (×3): 125 mg via ORAL
  Filled 2020-01-16 (×3): qty 1

## 2020-01-16 MED ORDER — PRAVASTATIN SODIUM 20 MG PO TABS
10.0000 mg | ORAL_TABLET | Freq: Every day | ORAL | Status: DC
  Administered 2020-01-16 – 2020-01-17 (×2): 10 mg via ORAL
  Filled 2020-01-16 (×2): qty 1

## 2020-01-16 MED ORDER — VITAMIN D 25 MCG (1000 UNIT) PO TABS
1000.0000 [IU] | ORAL_TABLET | Freq: Every day | ORAL | Status: DC
  Administered 2020-01-16 – 2020-01-18 (×3): 1000 [IU] via ORAL
  Filled 2020-01-16 (×3): qty 1

## 2020-01-16 MED ORDER — AMLODIPINE BESYLATE 5 MG PO TABS
5.0000 mg | ORAL_TABLET | Freq: Every day | ORAL | Status: DC
  Administered 2020-01-16 – 2020-01-18 (×3): 5 mg via ORAL
  Filled 2020-01-16 (×3): qty 1

## 2020-01-16 MED ORDER — ALBUTEROL SULFATE HFA 108 (90 BASE) MCG/ACT IN AERS: 2.0000 | INHALATION_SPRAY | RESPIRATORY_TRACT | Status: DC | PRN

## 2020-01-16 MED ORDER — PANTOPRAZOLE SODIUM 40 MG PO TBEC
40.0000 mg | DELAYED_RELEASE_TABLET | Freq: Every day | ORAL | Status: DC
  Administered 2020-01-16 – 2020-01-18 (×3): 40 mg via ORAL
  Filled 2020-01-16 (×3): qty 1

## 2020-01-16 MED ORDER — MELATONIN 3 MG SL SUBL: 6.0000 mg | SUBLINGUAL_TABLET | Freq: Every day | SUBLINGUAL | Status: DC

## 2020-01-16 MED ORDER — ALBUTEROL SULFATE HFA 108 (90 BASE) MCG/ACT IN AERS
2.0000 | INHALATION_SPRAY | Freq: Three times a day (TID) | RESPIRATORY_TRACT | Status: DC
  Administered 2020-01-17 – 2020-01-18 (×5): 2 via RESPIRATORY_TRACT

## 2020-01-16 MED ORDER — SERTRALINE HCL 100 MG PO TABS: 100.0000 mg | ORAL_TABLET | Freq: Every day | ORAL | Status: DC

## 2020-01-16 NOTE — Plan of Care (Signed)
  Problem: Education: Goal: Knowledge of risk factors and measures for prevention of condition will improve Outcome: Progressing   Problem: Coping: Goal: Psychosocial and spiritual needs will be supported Outcome: Progressing   Problem: Respiratory: Goal: Complications related to the disease process, condition or treatment will be avoided or minimized Outcome: Progressing   

## 2020-01-16 NOTE — Progress Notes (Signed)
PROGRESS NOTE    Anthony Hayes  KPT:[id number] DOB: December 18, 1935 DOA: 01/14/2020 PCP: Etter Sjogren, DO    Brief Narrative:  85 year old Caucasian male with PMH of dementia, hypothyroidism, hypertension, GERD, depression, BPH, TIA recently diagnosed with Covid 19 requiring hospital admission when he completed a course of Decadron/remdesivir and was discharged to ALF on room air returns with fever and worsening weakness.  CTA chest here showed changes consistent with atypical or multifocal pneumonia, no PE.  Received broad-spectrum antibiotics in the ED, deescalated to ceftriaxone/azithromycin on admission.  Assessment & Plan:   Principal Problem:   Pneumonia due to COVID-19 virus Active Problems:   Respiratory failure ([facility])   Hypothyroid   Dementia without behavioral disturbance ([facility])   Hypokalemia   Altered mental status   Acute hypoxic respiratory failure requiring oxygen via nasal cannula Likely secondary to COVID-19 pneumonia versus community-acquired pneumonia.  Tested positive for COVID-19 on 12/19/2019 and completed remdesivir, dexamethasone course during that hospitalization to be discharged to ALF on room air saturating 92%.  Return with hypoxia requiring 3 L oxygen via nasal cannula, fever.  CTA negative for PE but findings suggestive of atypical/multifocal pneumonia.  CRP at the time of discharge on 12/28/202 was 1.3, CRP on 01/14/2020 increased to 11. Blood cultures drawn 01/13/2019 negative till date. Remdesivir not ordered as he is already completed 1 course of it. Dexamethasone 6 mg daily albuterol inhaler every 6. Continue ceftriaxone/azithromycin Multivitamins Wean oxygen to room air  Hypokalemia Resolved with replacement  Altered mentation Improving.  Likely in setting of infection  Hypothyroidism Continue Synthroid  TIA Continue Plavix, pravastatin  Depression Continue Zoloft  Sinus bradycardia Patient is asymptomatic.  Per daughter this is baseline for  him.  DVT prophylaxis: Lovenox SQ Code Status: DNR  Family Communication: Updated patient's daughter [name] Disposition Plan: Back to ALF when medically stable   Consultants:   None  Procedures:  None  Antimicrobials:   Ceftriaxone, azithromycin   Subjective: Afebrile overnight.  Awake, alert, oriented to self and knows that he is in the hospital.  Says he does not feel well but is unable to point as to why.    Objective: Vitals:   01/15/20 0451 01/15/20 1256 01/15/20 2123 01/16/20 0521  BP: (!) 124/55 (!) 126/50 (!) 128/59 (!) 156/81  Pulse: (!) 46 (!) 43 (!) 43 (!) 41  Resp: 20 18 19 19   Temp: (!) 97.5 F (36.4 C) 97.7 F (36.5 C) 97.8 F (36.6 C) (!) 97.5 F (36.4 C)  TempSrc: Oral Oral Oral Oral  SpO2: 98% 99% 97% 96%  Weight:      Height:        Intake/Output Summary (Last 24 hours) at 01/16/2020 0754 Last data filed at 01/16/2020 0600 Gross per 24 hour  Intake 1349.2 ml  Output 675 ml  Net 674.2 ml   Filed Weights   01/14/20 2217  Weight: 72.3 kg    Examination:  General exam: Appears calm and comfortable, elderly Respiratory system: Clear to auscultation. Respiratory effort normal. Cardiovascular system: S1 & S2 heard, RRR. No JVD, murmurs. No pedal edema. Gastrointestinal system: Abdomen is nondistended, soft and nontender. Normal bowel sounds heard. Central nervous system: Alert and oriented to self. No focal obvious neurological deficits. Extremities: Moves all extremities voluntarily. Skin: No rashes, lesions or ulcers Psychiatry: Mood & affect appropriate.   Data Reviewed: I have personally reviewed following labs and imaging studies  CBC: Recent Labs  Lab 01/14/20 1553 01/14/20 2200 01/15/20 0631 01/16/20 0155  WBC  6.9 6.6 5.6 7.1  NEUTROABS 6.0  --  5.0 6.2  HGB 10.4* 10.5* 9.5* 11.1*  HCT 31.5* 32.1* 29.7* 35.5*  MCV 90.3 91.7 92.8 93.7  PLT 401* 373 325 428   Basic Metabolic Panel: Recent Labs  Lab 01/14/20 1553  01/14/20 2200 01/15/20 0631 01/16/20 0155  NA 142  --  140 139  K 3.2*  --  3.3* 4.0  CL 109  --  110 107  CO2 23  --  21* 22  GLUCOSE 104*  --  124* 143*  BUN 21  --  24* 21  CREATININE 1.05 1.06 0.89 0.94  CALCIUM 8.6*  --  8.7* 9.0   GFR: Estimated Creatinine Clearance: 58.6 mL/min (by C-G formula based on SCr of 0.94 mg/dL). Liver Function Tests: Recent Labs  Lab 01/14/20 1553 01/15/20 0631 01/16/20 0155  AST 21 21 30   ALT 16 17 21   ALKPHOS 78 67 75  BILITOT 0.4 0.7 0.4  PROT 6.4* 6.0* 6.6  ALBUMIN 2.6* 2.5* 2.7*   No results for input(s): LIPASE, AMYLASE in the last 168 hours. No results for input(s): AMMONIA in the last 168 hours. Coagulation Profile: No results for input(s): INR, PROTIME in the last 168 hours. Cardiac Enzymes: No results for input(s): CKTOTAL, CKMB, CKMBINDEX, TROPONINI in the last 168 hours. BNP (last 3 results) No results for input(s): PROBNP in the last 8760 hours. HbA1C: No results for input(s): HGBA1C in the last 72 hours. CBG: No results for input(s): GLUCAP in the last 168 hours. Lipid Profile: Recent Labs    01/14/20 1553  TRIG 68   Thyroid Function Tests: No results for input(s): TSH, T4TOTAL, FREET4, T3FREE, THYROIDAB in the last 72 hours. Anemia Panel: Recent Labs    01/14/20 1553  FERRITIN 334   Sepsis Labs: Recent Labs  Lab 01/14/20 1553 01/14/20 2155 01/15/20 0631  PROCALCITON <0.10  --   --   LATICACIDVEN  --  1.0 0.9    Recent Results (from the past 240 hour(s))  Blood Culture (routine x 2)     Status: None (Preliminary result)   Collection Time: 01/14/20  3:53 PM   Specimen: BLOOD  Result Value Ref Range Status   Specimen Description   Final    BLOOD LEFT ANTECUBITAL Performed at [facility], [place] [address], [place], [place] [address]    Special Requests   Final    BOTTLES DRAWN AEROBIC AND ANAEROBIC Blood Culture adequate volume Performed at [facility]  [address], [place], [place] [address]    Culture   Final    NO GROWTH 2 DAYS Performed at [facility] [address] [place], [place] [address]    Report Status PENDING  Incomplete  Blood Culture (routine x 2)     Status: None (Preliminary result)   Collection Time: 01/14/20  3:58 PM   Specimen: BLOOD  Result Value Ref Range Status   Specimen Description   Final    BLOOD RIGHT ANTECUBITAL Performed at [facility] [facility] [address], [place], [place] [address]    Special Requests   Final    BOTTLES DRAWN AEROBIC AND ANAEROBIC Blood Culture adequate volume Performed at [facility] [address] [place], [place] [address]    Culture   Final    NO GROWTH 2 DAYS Performed at [facility] [address], [place], [place] [address]    Report Status PENDING  Incomplete  Radiology Studies: CT Angio Chest PE W and/or Wo Contrast  Result Date: 01/14/2020 CLINICAL DATA:  85 year old male with shortness of breath. Elevated D-dimer. EXAM: CT ANGIOGRAPHY CHEST WITH CONTRAST TECHNIQUE: Multidetector CT imaging of the chest was performed using the standard protocol during bolus administration of intravenous contrast. Multiplanar CT image reconstructions and MIPs were obtained to evaluate the vascular anatomy. CONTRAST:  10m OMNIPAQUE IOHEXOL 350 MG/ML SOLN COMPARISON:  Chest CT dated 12/19/2019. FINDINGS: Evaluation of this exam is limited due to respiratory motion artifact. Cardiovascular: There is mild cardiomegaly. No pericardial effusion. Three-vessel coronary vascular calcifications noted. There is mild atherosclerotic calcification of the thoracic aorta. No aneurysmal dilatation or dissection. The origins of the great vessels of the aortic arch appear patent as visualized. Evaluation of the pulmonary arteries is limited due to respiratory motion artifact. No large or central pulmonary artery embolus identified. Mediastinum/Nodes:  There is no hilar or mediastinal adenopathy. There is a small hiatal hernia. The esophagus is grossly unremarkable. No mediastinal fluid collection. Lungs/Pleura: Bilateral peripheral and subpleural clusters of ground-glass and hazy airspace densities most consistent with multifocal pneumonia likely viral or atypical etiology including COVID-19. Clinical correlation and follow-up recommended. There is no pleural effusion or pneumothorax. The central airways are patent. Upper Abdomen: No acute abnormality. Musculoskeletal: Osteopenia with degenerative changes of the spine. No acute osseous pathology. Review of the MIP images confirms the above findings. IMPRESSION: 1. No CT evidence of central pulmonary artery embolus. 2. Multifocal pneumonia, likely viral or atypical in etiology including COVID-19. Clinical correlation is recommended. Electronically Signed   By: AAnner CreteM.D.   On: 01/14/2020 18:16   DG Chest Port 1 View  Result Date: 01/14/2020 CLINICAL DATA:  Fever, cough and low oxygen saturation. COVID positive patient. EXAM: PORTABLE CHEST 1 VIEW COMPARISON:  December 22, 2019 FINDINGS: Enlarged cardiac silhouette. Calcific atherosclerotic disease and tortuosity of the aorta. Mild patchy airspace consolidation in the left lung base. Improvement in the previously demonstrated bilateral upper lobe peripheral consolidation. Osseous structures are without acute abnormality. Soft tissues are grossly normal. IMPRESSION: 1. Mild patchy airspace consolidation in the left lung base. 2. Improvement in the previously demonstrated bilateral upper lobe peripheral consolidation. 3. Enlarged cardiac silhouette. Electronically Signed   By: DFidela SalisburyM.D.   On: 01/14/2020 16:32        Scheduled Meds: . albuterol  2 puff Inhalation Q6H  . vitamin C  500 mg Oral Daily  . dexamethasone (DECADRON) injection  6 mg Intravenous Q24H  . enoxaparin (LOVENOX) injection  40 mg Subcutaneous QHS  . zinc  sulfate  220 mg Oral Daily   Continuous Infusions: . sodium chloride 10 mL/hr at 01/16/20 0600  . azithromycin 500 mg (01/15/20 0923)  . cefTRIAXone (ROCEPHIN)  IV 1 g (01/15/20 0831)     LOS: 2 days    Time spent: Spent more than 30 minutes in coordinating care for this patient including bedside patient care.   JLucky Cowboy MD [facility] If 7PM-7AM, please contact night-coverage 01/16/2020, 7:54 AM

## 2020-01-16 NOTE — Progress Notes (Signed)
Spoke with daughters [name] and [place]. Updated on POC. Verbalized understanding.

## 2020-01-17 ENCOUNTER — Telehealth: Payer: Self-pay

## 2020-01-17 LAB — CBC WITH DIFFERENTIAL/PLATELET
Abs Immature Granulocytes: 0.08 10*3/uL — ABNORMAL HIGH (ref 0.00–0.07)
Basophils Absolute: 0 10*3/uL (ref 0.0–0.1)
Basophils Relative: 0 %
Eosinophils Absolute: 0 10*3/uL (ref 0.0–0.5)
Eosinophils Relative: 0 %
HCT: 31.1 % — ABNORMAL LOW (ref 39.0–52.0)
Hemoglobin: 10.1 g/dL — ABNORMAL LOW (ref 13.0–17.0)
Immature Granulocytes: 1 %
Lymphocytes Relative: 7 %
Lymphs Abs: 0.5 10*3/uL — ABNORMAL LOW (ref 0.7–4.0)
MCH: 29.3 pg (ref 26.0–34.0)
MCHC: 32.5 g/dL (ref 30.0–36.0)
MCV: 90.1 fL (ref 80.0–100.0)
Monocytes Absolute: 0.2 10*3/uL (ref 0.1–1.0)
Monocytes Relative: 2 %
Neutro Abs: 6.7 10*3/uL (ref 1.7–7.7)
Neutrophils Relative %: 90 %
Platelets: 362 10*3/uL (ref 150–400)
RBC: 3.45 MIL/uL — ABNORMAL LOW (ref 4.22–5.81)
RDW: 13.6 % (ref 11.5–15.5)
WBC: 7.5 10*3/uL (ref 4.0–10.5)
nRBC: 0 % (ref 0.0–0.2)

## 2020-01-17 LAB — COMPREHENSIVE METABOLIC PANEL
ALT: 18 U/L (ref 0–44)
AST: 24 U/L (ref 15–41)
Albumin: 2.7 g/dL — ABNORMAL LOW (ref 3.5–5.0)
Alkaline Phosphatase: 69 U/L (ref 38–126)
Anion gap: 10 (ref 5–15)
BUN: 27 mg/dL — ABNORMAL HIGH (ref 8–23)
CO2: 22 mmol/L (ref 22–32)
Calcium: 8.9 mg/dL (ref 8.9–10.3)
Chloride: 102 mmol/L (ref 98–111)
Creatinine, Ser: 0.87 mg/dL (ref 0.61–1.24)
GFR calc Af Amer: >60 mL/min (ref >60)
GFR calc non Af Amer: >60 mL/min (ref >60)
Glucose, Bld: 134 mg/dL — ABNORMAL HIGH (ref 70–99)
Potassium: 3.8 mmol/L (ref 3.5–5.1)
Sodium: 134 mmol/L — ABNORMAL LOW (ref 135–145)
Total Bilirubin: 0.6 mg/dL (ref 0.3–1.2)
Total Protein: 6 g/dL — ABNORMAL LOW (ref 6.5–8.1)

## 2020-01-17 LAB — C-REACTIVE PROTEIN: CRP: 3.6 mg/dL — ABNORMAL HIGH (ref <1.0)

## 2020-01-17 LAB — D-DIMER, QUANTITATIVE: D-Dimer, Quant: 0.86 ug/mL-FEU — ABNORMAL HIGH (ref 0.00–0.50)

## 2020-01-17 LAB — FERRITIN: Ferritin: 567 ng/mL — ABNORMAL HIGH (ref 24–336)

## 2020-01-17 MED ORDER — AZITHROMYCIN 250 MG PO TABS
500.0000 mg | ORAL_TABLET | Freq: Every day | ORAL | Status: DC
  Administered 2020-01-18: 500 mg via ORAL
  Filled 2020-01-17 (×2): qty 2

## 2020-01-17 NOTE — Progress Notes (Signed)
Updated patient's daughters [name] and [name] via telephone.  They requested if the patient could have inpatient rehab.  Discussed he would not be a good candidate for inpatient rehab.  They will discuss this further and let us know what their preference would be: ALF with home health PT versus SNF for short-term rehab.  Reported patient had received a COVID-19 vaccine the day prior to presentation.  Also requested a repeat COVID-19 test to see if he is still positive.  No COVID-19 test was ordered at the time of admission.  Last positive test on 12/19/2019.  COVID-19 test ordered.  Budd Palmer, MD Internal Medicine  Hospitalist

## 2020-01-17 NOTE — Telephone Encounter (Signed)
Received update from [place], nurse at [facility], that patient currently in the hospital at [facility]. Hospital liaison team and Palliative NP updated.

## 2020-01-17 NOTE — Plan of Care (Signed)
  Problem: Education: Goal: Knowledge of risk factors and measures for prevention of condition will improve Outcome: Progressing   Problem: Coping: Goal: Psychosocial and spiritual needs will be supported Outcome: Progressing   Problem: Respiratory: Goal: Complications related to the disease process, condition or treatment will be avoided or minimized Outcome: Progressing   

## 2020-01-17 NOTE — Progress Notes (Signed)
PROGRESS NOTE    Anthony Hayes  WYO:[id number] DOB: 02/05/1935 DOA: 01/14/2020 PCP: Etter Sjogren, DO    Brief Narrative:  85 year old Caucasian male with PMH of dementia, hypothyroidism, hypertension, GERD, depression, BPH, TIA recently diagnosed with Covid 19 requiring hospital admission when he completed a course of Decadron/remdesivir and was discharged to ALF on room air returns with fever and worsening weakness.  CTA chest here showed changes consistent with atypical or multifocal pneumonia, no PE.  Received broad-spectrum antibiotics in the ED, deescalated to ceftriaxone/azithromycin on admission.  Assessment & Plan:   Principal Problem:   Pneumonia due to COVID-19 virus Active Problems:   Respiratory failure (HCC)   Hypothyroid   Dementia without behavioral disturbance (HCC)   Hypokalemia   Altered mental status   Acute hypoxic respiratory failure requiring oxygen via nasal cannula Likely secondary to COVID-19 pneumonia versus community-acquired pneumonia.  Tested positive for COVID-19 on 12/19/2019 and completed remdesivir, dexamethasone course during that hospitalization to be discharged to ALF on room air saturating 92%.  Return with hypoxia requiring 3 L oxygen via nasal cannula, fever cements has been weaned to room air.  CTA negative for PE but findings suggestive of atypical/multifocal pneumonia.  CRP at the time of discharge on 12/28/202 was 1.3, CRP on 01/14/2020 increased to 11. Blood cultures drawn 01/13/2019 negative till date. Remdesivir not ordered as he is already completed 1 course of it. Dexamethasone 6 mg daily albuterol inhaler every 6. Continue ceftriaxone/azithromycin day 4/5 Multivitamins  Hypokalemia Resolved with replacement  Altered mentation Improving.  Likely in setting of infection  Hypothyroidism Continue Synthroid  TIA Continue Plavix, pravastatin  Depression Continue Zoloft  Sinus bradycardia Patient is asymptomatic.  Per daughter  this is baseline for him.  DVT prophylaxis: Lovenox SQ Code Status: DNR  Family Communication: Attempted to call daughter [name] to give her an update, no response  Disposition Plan: Pending safe disposition/bed availability at ALF   Consultants:   None  Procedures:  None  Antimicrobials:   Ceftriaxone, azithromycin   Subjective: Afebrile overnight.  Awake, alert, oriented to self and knows that he is in the hospital.  Says he does not feel well but is unable to point as to why.    Objective: Vitals:   01/16/20 1441 01/16/20 1953 01/17/20 0437 01/17/20 1317  BP: (!) 145/76 131/86 129/87 129/61  Pulse: (!) 43 (!) 41 (!) 42 (!) 42  Resp: 16 19 18 16   Temp: 97.8 F (36.6 C) 98.2 F (36.8 C) 97.6 F (36.4 C) 98.7 F (37.1 C)  TempSrc: Oral Oral Oral Oral  SpO2: 91% 93% 95% 94%  Weight:      Height:        Intake/Output Summary (Last 24 hours) at 01/17/2020 1731 Last data filed at 01/17/2020 1000 Gross per 24 hour  Intake 1355.03 ml  Output 1150 ml  Net 205.03 ml   Filed Weights   01/14/20 2217  Weight: 72.3 kg    Examination:  General exam: Appears calm and comfortable, elderly Respiratory system: Clear to auscultation. Respiratory effort normal. Cardiovascular system: S1 & S2 heard, RRR. No JVD, murmurs. No pedal edema. Gastrointestinal system: Abdomen is nondistended, soft and nontender. Normal bowel sounds heard. Central nervous system: Alert and oriented to self. No focal obvious neurological deficits. Extremities: Moves all extremities voluntarily. Skin: No rashes, lesions or ulcers Psychiatry: Mood & affect appropriate.   Data Reviewed: I have personally reviewed following labs and imaging studies  CBC: Recent Labs  Lab 01/14/20 1553  01/14/20 2200 01/15/20 0631 01/16/20 0155 01/17/20 0145  WBC 6.9 6.6 5.6 7.1 7.5  NEUTROABS 6.0  --  5.0 6.2 6.7  HGB 10.4* 10.5* 9.5* 11.1* 10.1*  HCT 31.5* 32.1* 29.7* 35.5* 31.1*  MCV 90.3 91.7 92.8 93.7  90.1  PLT 401* 373 325 347 478   Basic Metabolic Panel: Recent Labs  Lab 01/14/20 1553 01/14/20 2200 01/15/20 0631 01/16/20 0155 01/17/20 0145  NA 142  --  140 139 134*  K 3.2*  --  3.3* 4.0 3.8  CL 109  --  110 107 102  CO2 23  --  21* 22 22  GLUCOSE 104*  --  124* 143* 134*  BUN 21  --  24* 21 27*  CREATININE 1.05 1.06 0.89 0.94 0.87  CALCIUM 8.6*  --  8.7* 9.0 8.9   GFR: Estimated Creatinine Clearance: 63.3 mL/min (by C-G formula based on SCr of 0.87 mg/dL). Liver Function Tests: Recent Labs  Lab 01/14/20 1553 01/15/20 0631 01/16/20 0155 01/17/20 0145  AST 21 21 30 24   ALT 16 17 21 18   ALKPHOS 78 67 75 69  BILITOT 0.4 0.7 0.4 0.6  PROT 6.4* 6.0* 6.6 6.0*  ALBUMIN 2.6* 2.5* 2.7* 2.7*   No results for input(s): LIPASE, AMYLASE in the last 168 hours. No results for input(s): AMMONIA in the last 168 hours. Coagulation Profile: No results for input(s): INR, PROTIME in the last 168 hours. Cardiac Enzymes: No results for input(s): CKTOTAL, CKMB, CKMBINDEX, TROPONINI in the last 168 hours. BNP (last 3 results) No results for input(s): PROBNP in the last 8760 hours. HbA1C: No results for input(s): HGBA1C in the last 72 hours. CBG: No results for input(s): GLUCAP in the last 168 hours. Lipid Profile: No results for input(s): CHOL, HDL, LDLCALC, TRIG, CHOLHDL, LDLDIRECT in the last 72 hours. Thyroid Function Tests: No results for input(s): TSH, T4TOTAL, FREET4, T3FREE, THYROIDAB in the last 72 hours. Anemia Panel: Recent Labs    01/17/20 0145  FERRITIN 567*   Sepsis Labs: Recent Labs  Lab 01/14/20 1553 01/14/20 2155 01/15/20 0631  PROCALCITON <0.10  --   --   LATICACIDVEN  --  1.0 0.9    Recent Results (from the past 240 hour(s))  Blood Culture (routine x 2)     Status: None (Preliminary result)   Collection Time: 01/14/20  3:53 PM   Specimen: BLOOD  Result Value Ref Range Status   Specimen Description   Final    BLOOD LEFT ANTECUBITAL Performed at  [facility], [place] [address] [place], [place] [address]    Special Requests   Final    BOTTLES DRAWN AEROBIC AND ANAEROBIC Blood Culture adequate volume Performed at [place] [address], [place], [place] [address]    Culture   Final    NO GROWTH 3 DAYS Performed at [facility], [place] [address], [place], [place] [address]    Report Status PENDING  Incomplete  Blood Culture (routine x 2)     Status: None (Preliminary result)   Collection Time: 01/14/20  3:58 PM   Specimen: BLOOD  Result Value Ref Range Status   Specimen Description   Final    BLOOD RIGHT ANTECUBITAL Performed at [place] [address], [place], [place] [address]    Special Requests   Final    BOTTLES DRAWN AEROBIC AND ANAEROBIC Blood Culture adequate volume Performed at [place] [address] [place], [place]  [address]    Culture   Final    NO GROWTH 3 DAYS Performed at [facility], [place] [address] [place], [place] [address]    Report Status PENDING  Incomplete       Radiology Studies: No results found.    Scheduled Meds: . albuterol  2 puff Inhalation TID  . amLODipine  5 mg Oral Daily  . vitamin C  500 mg Oral Daily  . [START ON 01/18/2020] azithromycin  500 mg Oral Daily  . cholecalciferol  1,000 Units Oral Daily  . clopidogrel  75 mg Oral Daily  . dexamethasone (DECADRON) injection  6 mg Intravenous Q24H  . enoxaparin (LOVENOX) injection  40 mg Subcutaneous QHS  . levothyroxine  50 mcg Oral Q0600  . Melatonin  3 mg Oral QHS  . pantoprazole  40 mg Oral Daily  . pravastatin  10 mg Oral q1800  . sertraline  125 mg Oral Daily  . zinc sulfate  220 mg Oral Daily   Continuous Infusions: . sodium chloride 10 mL/hr at 01/16/20 0600  . cefTRIAXone (ROCEPHIN)  IV 1 g (01/17/20 0826)     LOS: 3 days    Time spent: Spent more than 30 minutes in coordinating care for this patient including  bedside patient care.   Lucky Cowboy, MD [facility] If 7PM-7AM, please contact night-coverage 01/17/2020, 5:31 PM

## 2020-01-17 NOTE — NC FL2 (Signed)
[place] LEVEL OF CARE SCREENING TOOL     IDENTIFICATION  Patient Name: Anthony Hayes Birthdate: 04/21/1935 Sex: male Admission Date (Current Location): 01/14/2020  [facility] and [place] Number:  Herbalist and Address:  [facility],  [place], [place]      Provider Number: [id number]  Attending Physician Name and Address:  Lucky Cowboy, MD  Relative Name and Phone Number:  Philpott,Anita Daughter   [phone] or Seate,Whitney Daughter   [phone] or Sula Rumple   [phone]    Current Level of Care: Hospital Recommended Level of Care: [place], Memory Care Prior Approval Number:    Date Approved/Denied:   PASRR Number:    Discharge Plan: Other (Comment)([facility] unit)    Current Diagnoses: Patient Active Problem List   Diagnosis Date Noted  . Altered mental status 01/15/2020  . Pneumonia due to COVID-19 virus 01/14/2020  . COVID-19 virus infection 12/19/2019  . Protein-calorie malnutrition, severe ([place]) 12/19/2019  . HCAP (healthcare-associated pneumonia) 11/22/2018  . Fall 11/22/2018  . Chronic diastolic CHF (congestive heart failure) ([place]) 11/22/2018  . Hypoxia   . Bradycardia 10/28/2018  . Leg swelling 10/28/2018  . Irregular heart beat 09/08/2018  . Sleep disturbance   . Stage 3 chronic kidney disease   . Benign essential HTN   . Hypokalemia   . Hyponatremia   . Infarction of right basal ganglia ([place]) 04/06/2018  . Pressure injury of skin 04/06/2018  . Vitamin B12 deficiency   . Hypothyroid   . Crohn's disease with complication ([place])   . Benign prostatic hyperplasia   . Acute lower UTI   . Dementia without behavioral disturbance ([place])   . Acute ischemic stroke ([place]) 04/05/2018  . Leukocytosis   . Shortness of breath   . Acute encephalopathy   . CVA (cerebral vascular accident) ([place]) 04/03/2018  . Calculus, kidney 01/21/2018  . Acute blood loss anemia    . Mallory-Weiss syndrome   . Esophageal stricture   . Respiratory failure ([place])   . UGIB (upper gastrointestinal bleed) 10/16/2017  . GERD (gastroesophageal reflux disease) 10/16/2017  . Essential hypertension, benign 10/16/2017  . Chest pain 10/15/2017  . Hypothyroidism (acquired) 06/08/2017  . Chronic kidney disease, stage III (moderate) 03/19/2016  . Dyslipidemia 03/19/2016  . Degeneration of lumbar or lumbosacral intervertebral disc 07/10/2008  . Arthropathy, lower leg 05/23/2004    Orientation RESPIRATION BLADDER Height & Weight     Self  Normal Incontinent Weight: 159 lb 6.3 oz (72.3 kg) Height:  5' 8"  (172.7 cm)  BEHAVIORAL SYMPTOMS/MOOD NEUROLOGICAL BOWEL NUTRITION STATUS      Incontinent Diet(Clear Liquid diet)  AMBULATORY STATUS COMMUNICATION OF NEEDS Skin   Limited Assist Verbally PU Stage and Appropriate Care PU Stage 1 Dressing: (PRN dressing changes)                     Personal Care Assistance Level of Assistance  Bathing, Feeding, Dressing Bathing Assistance: Limited assistance Feeding assistance: Limited assistance Dressing Assistance: Limited assistance     Functional Limitations Info  Sight, Hearing, Speech Sight Info: Adequate Hearing Info: Adequate Speech Info: Adequate    SPECIAL CARE FACTORS FREQUENCY  PT (By licensed PT), OT (By licensed OT)     PT Frequency: Home health PT minimum 2x a week OT Frequency: Home Health OT minimum 2x a week            Contractures Contractures Info: Not present  Additional Factors Info  Code Status, Allergies, Psychotropic, Isolation Precautions Code Status Info: DNR Allergies Info: Lisinopril Psychotropic Info: sertraline (ZOLOFT) tablet 125 mg         Current Medications (01/17/2020):  This is the current hospital active medication list Current Facility-Administered Medications  Medication Dose Route Frequency Provider Last Rate Last Admin  . 0.9 %  sodium chloride infusion   Intravenous  PRN Bunnie Pion Z, DO 10 mL/hr at 01/16/20 0600 Rate Verify at 01/16/20 0600  . acetaminophen (TYLENOL) tablet 650 mg  650 mg Oral Q6H PRN Bunnie Pion Z, DO      . albuterol (VENTOLIN HFA) 108 (90 Base) MCG/ACT inhaler 2 puff  2 puff Inhalation TID Lucky Cowboy, MD   2 puff at 01/17/20 [phone]  . albuterol (VENTOLIN HFA) 108 (90 Base) MCG/ACT inhaler 2 puff  2 puff Inhalation Q4H PRN Lucky Cowboy, MD      . amLODipine (NORVASC) tablet 5 mg  5 mg Oral Daily Lucky Cowboy, MD   5 mg at 01/17/20 7408  . ascorbic acid (VITAMIN C) tablet 500 mg  500 mg Oral Daily Bunnie Pion Z, DO   500 mg at 01/17/20 1448  . [START ON 01/18/2020] azithromycin (ZITHROMAX) tablet 500 mg  500 mg Oral Daily Lucky Cowboy, MD      . cefTRIAXone (ROCEPHIN) 1 g in sodium chloride 0.9 % 100 mL IVPB  1 g Intravenous Q24H Bunnie Pion Z, DO 200 mL/hr at 01/17/20 0826 1 g at 01/17/20 0826  . cholecalciferol (VITAMIN D3) tablet 1,000 Units  1,000 Units Oral Daily Lucky Cowboy, MD   1,000 Units at 01/17/20 [phone]  . clopidogrel (PLAVIX) tablet 75 mg  75 mg Oral Daily Lucky Cowboy, MD   75 mg at 01/17/20 3149  . dexamethasone (DECADRON) injection 6 mg  6 mg Intravenous Q24H Bunnie Pion Z, DO   6 mg at 01/16/20 1929  . enoxaparin (LOVENOX) injection 40 mg  40 mg Subcutaneous QHS Bunnie Pion Z, DO   40 mg at 01/16/20 2106  . levothyroxine (SYNTHROID) tablet 50 mcg  50 mcg Oral Q0600 Lucky Cowboy, MD   50 mcg at 01/17/20 0525  . Melatonin TABS 3 mg  3 mg Oral QHS Lucky Cowboy, MD   3 mg at 01/16/20 2106  . ondansetron (ZOFRAN) tablet 4 mg  4 mg Oral Q6H PRN Bunnie Pion Z, DO       Or  . ondansetron [facility]) injection 4 mg  4 mg Intravenous Q6H PRN Bunnie Pion Z, DO      . pantoprazole (PROTONIX) EC tablet 40 mg  40 mg Oral Daily Lucky Cowboy, MD   40 mg at 01/17/20 7026  . pravastatin (PRAVACHOL) tablet 10 mg  10 mg Oral q1800 Lucky Cowboy, MD   10 mg at 01/16/20 1850  . sertraline (ZOLOFT) tablet  125 mg  125 mg Oral Daily Lucky Cowboy, MD   125 mg at 01/17/20 3785  . zinc sulfate capsule 220 mg  220 mg Oral Daily Bunnie Pion Z, DO   220 mg at 01/17/20 8850     Discharge Medications: Please see discharge summary for a list of discharge medications.  Relevant Imaging Results:  Relevant Lab Results:   Additional Information SSN [id number]  Ross Ludwig, LCSW

## 2020-01-17 NOTE — Evaluation (Signed)
Physical Therapy Evaluation Patient Details Name: Anthony Hayes MRN: [id number] DOB: 03/17/1935 Today's Date: 01/17/2020   History of Present Illness  Pt admitted with Covid+ PNA on 1/17, pt with recent acute care stay for similar diagnosis. PMH of dementia, TIA, lumbar fusion, L RCR CKD and colectomy  Clinical Impression   Pt presents with generalized weakness, poor sitting and standing balance, difficulty mobilizing out of bed, and decreased activity tolerance. Pt to benefit from acute PT to address deficits. Pt required mod-max assist +2 for bed mobility, sitting balance, and transfer to recliner at bedside. Per pt's daughters [name] and [name], who this PT spoke with over the phone, pt typically requires +1 assist for mobility and ADLs. Per family request, PT recommending return to ALF memory care unit with HHPT, OT consult also placed for pt. PT feels pt's home environment with maxed out supplemental services will be appropriate for d/c. PT to progress mobility as tolerated, and will continue to follow acutely.    SpO2: 90% and above on RA   Follow Up Recommendations Home health PT;Supervision/Assistance - 24 hour(back to ALF with HHPT, per family request)    Equipment Recommendations  None recommended by PT    Recommendations for Other Services  OT consult     Precautions / Restrictions Precautions Precautions: Fall Restrictions Weight Bearing Restrictions: No      Mobility  Bed Mobility Overal bed mobility: Needs Assistance Bed Mobility: Supine to Sit     Supine to sit: Mod assist;HOB elevated;+2 for safety/equipment;+2 for physical assistance     General bed mobility comments: mod assist for walking LEs to EOB, trunk elevation with HHA to assist, scooting to EOB with use of bed pads. verbal and tactile cuing for seqencing. Total assist +2 for scooting to [facility] in preparation for stand pivot transfer to recliner.  Transfers Overall transfer level: Needs  assistance Equipment used: 2 person hand held assist Transfers: Sit to/from Stand Sit to Stand: Max assist;+2 physical assistance;+2 safety/equipment;From elevated surface         General transfer comment: max assist +2 for power up, hip extension, steadying upon standing. Pt with use of HHA +2 and truncal support to come to standing, tolerated ~15 seconds static standing with verbal cuing for upright posture. Sit to stand x2, both times from EOB.  Ambulation/Gait             General Gait Details: unable to attempt - 3 weeks ago pt amb 18 ft with PT  Stairs            Wheelchair Mobility    Modified Rankin (Stroke Patients Only)       Balance Overall balance assessment: Needs assistance;History of Falls Sitting-balance support: Bilateral upper extremity supported;Feet supported Sitting balance-[name] Scale: Poor Sitting balance - Comments: heavy posterior and L lateral leaning, corrected with PT assist and use of chair back in front of pt for him to hold onto. Cannot sit without support. Sat EOB with support ~10 minutes Postural control: Posterior lean;Left lateral lean Standing balance support: Bilateral upper extremity supported;During functional activity Standing balance-[name] Scale: Zero Standing balance comment: max +2 to stand                             Pertinent Vitals/Pain Pain Assessment: No/denies pain Faces Pain Scale: No hurt    Home Living Family/patient expects to be discharged to:: Other (Comment)  Additional Comments: Pt is resident at [facility], admitted from [facility]    Prior Function Level of Independence: Needs assistance   Gait / Transfers Assistance Needed: pt states "yes" when asked if he was walking PTA, unsure of accuracy given baseline of dementia     Comments: 3 weeks ago, charting as follows: "RN advises she has spoken to dtr - pt is single person assist at memory care unit for  transfers"     Hand Dominance   Dominant Hand: Left    Extremity/Trunk Assessment   Upper Extremity Assessment Upper Extremity Assessment: Generalized weakness    Lower Extremity Assessment Lower Extremity Assessment: Generalized weakness    Cervical / Trunk Assessment Cervical / Trunk Assessment: Other exceptions Cervical / Trunk Exceptions: rounded shoulders, forward head  Communication   Communication: HOH  Cognition Arousal/Alertness: Awake/alert Behavior During Therapy: Flat affect Overall Cognitive Status: History of cognitive impairments - at baseline                                 General Comments: Pt oriented to self, could not state previous residence or current location and was shocked to discover he was in [place]. Pt follows one-step commands consistently with multimodal cuing as needed.      General Comments General comments (skin integrity, edema, etc.): open scab noted on pt's skull on upper L, pt reports "I've had some falls" and unsure if this is related    Exercises General Exercises - Lower Extremity Ankle Circles/Pumps: AROM;Both;5 reps;Supine Long Arc Quad: AROM;Both;5 reps;Seated   Assessment/Plan    PT Assessment Patient needs continued PT services  PT Problem List Decreased strength;Decreased activity tolerance;Decreased balance;Decreased mobility;Decreased cognition;Decreased knowledge of use of DME;Decreased safety awareness       PT Treatment Interventions DME instruction;Gait training;Functional mobility training;Therapeutic activities;Balance training;Therapeutic exercise;Patient/family education;Neuromuscular re-education    PT Goals (Current goals can be found in the Care Plan section)  Acute Rehab PT Goals Patient Stated Goal: per family - back to memory care with [facility] services PT Goal Formulation: With patient/family Time For Goal Achievement: 01/31/20 Potential to Achieve Goals: Fair    Frequency Min  3X/week   Barriers to discharge        Co-evaluation               AM-PAC PT "6 Clicks" Mobility  Outcome Measure Help needed turning from your back to your side while in a flat bed without using bedrails?: A Lot Help needed moving from lying on your back to sitting on the side of a flat bed without using bedrails?: A Lot Help needed moving to and from a bed to a chair (including a wheelchair)?: Total Help needed standing up from a chair using your arms (e.g., wheelchair or bedside chair)?: Total Help needed to walk in hospital room?: Total Help needed climbing 3-5 steps with a railing? : Total 6 Click Score: 8    End of Session Equipment Utilized During Treatment: Gait belt Activity Tolerance: Patient tolerated treatment well Patient left: in chair;with call bell/phone within reach;with chair alarm set Nurse Communication: Mobility status PT Visit Diagnosis: Difficulty in walking, not elsewhere classified (R26.2);Muscle weakness (generalized) (M62.81)    Time: 2947-6546 PT Time Calculation (min) (ACUTE ONLY): 23 min   Charges:   PT Evaluation $PT Eval Low Complexity: 1 Low PT Treatments $Neuromuscular Re-education: 8-22 mins       Odilia Damico E,  PT Acute Rehabilitation Services Pager [phone]  Office [phone]  Nichalos Brenton D Elonda Husky 01/17/2020, 11:17 AM

## 2020-01-17 NOTE — TOC Progression Note (Addendum)
Transition of Care [facility]) - Progression Note    Patient Details  Name: Anthony Hayes MRN: [id number] Date of Birth: July 02, 1935  Transition of Care [facility]) CM/SW Contact  Ross Ludwig, [place] Phone Number: 01/17/2020, 1:38 PM  Clinical Narrative:    CSW spoke to Legrand Como at [facility], to see if patient is able to return today.  Legrand Como said he will review patient's clinicals and call CSW back if they can accept him back today.  CSW awaiting for call back from [facility].    4:45pm  CSW has not heard back from Legrand Como yet, CSW left message on voice mail for patient.    Expected Discharge Plan and Services  Patient to return back to Memory Care ALF.                                               Social Determinants of Health (SDOH) Interventions    Readmission Risk Interventions No flowsheet data found.

## 2020-01-17 NOTE — Progress Notes (Signed)
Patient assisted from chair to bed with assist x3 at this time. Tolerated well. Patient disoriented to place, time and situation. Gown and linens changed at this time. Patient positioned for comfort, decreased stimulation in room. No s/s of acute distress noted. O2 sat 96% on room air. Safety measures intact. Will continue to monitor.

## 2020-01-17 NOTE — Progress Notes (Signed)
PHARMACIST - PHYSICIAN COMMUNICATION  CONCERNING: Antibiotic IV to Oral Route Change Policy  RECOMMENDATION: This patient is receiving azithromycin by the intravenous route.  Based on criteria approved by the Pharmacy and Therapeutics Committee, the antibiotic(s) is/are being converted to the equivalent oral dose form(s).   DESCRIPTION: These criteria include:  Patient being treated for a respiratory tract infection, urinary tract infection, cellulitis or clostridium difficile associated diarrhea if on metronidazole  The patient is not neutropenic and does not exhibit a GI malabsorption state  The patient is eating (either orally or via tube) and/or has been taking other orally administered medications for a least 24 hours  The patient is improving clinically and has a Tmax < 100.5  If you have questions about this conversion, please contact the Pharmacy Department  []  ( [phone] )  [place] []  ( [phone] )  [facility] []  ( [phone] )  [place] []  ( [phone] )  [facility] [x]  ( [phone] )  [facility]  

## 2020-01-18 LAB — CBC WITH DIFFERENTIAL/PLATELET
Abs Immature Granulocytes: 0.1 10*3/uL — ABNORMAL HIGH (ref 0.00–0.07)
Basophils Absolute: 0 10*3/uL (ref 0.0–0.1)
Basophils Relative: 0 %
Eosinophils Absolute: 0 10*3/uL (ref 0.0–0.5)
Eosinophils Relative: 0 %
HCT: 32.1 % — ABNORMAL LOW (ref 39.0–52.0)
Hemoglobin: 10.3 g/dL — ABNORMAL LOW (ref 13.0–17.0)
Immature Granulocytes: 2 %
Lymphocytes Relative: 7 %
Lymphs Abs: 0.5 10*3/uL — ABNORMAL LOW (ref 0.7–4.0)
MCH: 28.7 pg (ref 26.0–34.0)
MCHC: 32.1 g/dL (ref 30.0–36.0)
MCV: 89.4 fL (ref 80.0–100.0)
Monocytes Absolute: 0.1 10*3/uL (ref 0.1–1.0)
Monocytes Relative: 2 %
Neutro Abs: 6 10*3/uL (ref 1.7–7.7)
Neutrophils Relative %: 89 %
Platelets: 416 10*3/uL — ABNORMAL HIGH (ref 150–400)
RBC: 3.59 MIL/uL — ABNORMAL LOW (ref 4.22–5.81)
RDW: 13.2 % (ref 11.5–15.5)
WBC: 6.8 10*3/uL (ref 4.0–10.5)
nRBC: 0 % (ref 0.0–0.2)

## 2020-01-18 LAB — C-REACTIVE PROTEIN: CRP: 2 mg/dL — ABNORMAL HIGH (ref <1.0)

## 2020-01-18 LAB — COMPREHENSIVE METABOLIC PANEL
ALT: 23 U/L (ref 0–44)
AST: 32 U/L (ref 15–41)
Albumin: 2.8 g/dL — ABNORMAL LOW (ref 3.5–5.0)
Alkaline Phosphatase: 78 U/L (ref 38–126)
Anion gap: 11 (ref 5–15)
BUN: 23 mg/dL (ref 8–23)
CO2: 22 mmol/L (ref 22–32)
Calcium: 8.6 mg/dL — ABNORMAL LOW (ref 8.9–10.3)
Chloride: 102 mmol/L (ref 98–111)
Creatinine, Ser: 0.97 mg/dL (ref 0.61–1.24)
GFR calc Af Amer: >60 mL/min (ref >60)
GFR calc non Af Amer: >60 mL/min (ref >60)
Glucose, Bld: 128 mg/dL — ABNORMAL HIGH (ref 70–99)
Potassium: 3.5 mmol/L (ref 3.5–5.1)
Sodium: 135 mmol/L (ref 135–145)
Total Bilirubin: 0.6 mg/dL (ref 0.3–1.2)
Total Protein: 6.3 g/dL — ABNORMAL LOW (ref 6.5–8.1)

## 2020-01-18 LAB — FERRITIN: Ferritin: 402 ng/mL — ABNORMAL HIGH (ref 24–336)

## 2020-01-18 LAB — SARS CORONAVIRUS 2 (TAT 6-24 HRS): SARS Coronavirus 2: POSITIVE — AB

## 2020-01-18 LAB — D-DIMER, QUANTITATIVE: D-Dimer, Quant: 0.68 ug/mL-FEU — ABNORMAL HIGH (ref 0.00–0.50)

## 2020-01-18 MED ORDER — ASCORBIC ACID 500 MG PO TABS: 500.0000 mg | ORAL_TABLET | Freq: Every day | ORAL | 0 refills | Status: DC

## 2020-01-18 MED ORDER — AMOXICILLIN-POT CLAVULANATE 875-125 MG PO TABS: 1.0000 | ORAL_TABLET | Freq: Two times a day (BID) | ORAL | 0 refills | Status: AC

## 2020-01-18 MED ORDER — ZINC SULFATE 220 (50 ZN) MG PO CAPS: 220.0000 mg | ORAL_CAPSULE | Freq: Every day | ORAL | 0 refills | Status: DC

## 2020-01-18 MED ORDER — DEXAMETHASONE 6 MG PO TABS: 6.0000 mg | ORAL_TABLET | Freq: Every day | ORAL | 0 refills | Status: AC

## 2020-01-18 NOTE — Plan of Care (Signed)
  Problem: Respiratory: Goal: Complications related to the disease process, condition or treatment will be avoided or minimized Outcome: Progressing

## 2020-01-18 NOTE — TOC Initial Note (Signed)
Transition of Care [facility]) - Initial/Assessment Note    Patient Details  Name: Anthony Hayes MRN: [id number] Date of Birth: March 22, 1935  [facility] of [facility]) CM/SW Contact:    Ross Ludwig, LCSW Phone Number: 01/18/2020, 10:09 AM  Clinical Narrative:                  Patient is an 85 year old male who is alert and oriented x1.  Patient has dementia, CSW completed assessment by speaking to patient's daughter and also talking to [facility].  Patient has been at [facility] for a couple of years, patient's family would like him to return however, per ALF they need patient to go to SNF for short term rehab first before he is able to return back to ALF.  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  Patient is on a heart healthy diet and has been participating with therapy.  Patient's daughter gave CSW permission to begin bed search for Covid + beds.  Expected Discharge Plan: Skilled Nursing Facility Barriers to Discharge: SNF Pending bed offer   Patient Goals and CMS Choice Patient states their goals for this hospitalization and ongoing recovery are:: To go to SNF for short term rehab, then return back to ALF. CMS Medicare.gov Compare Post Acute Care list provided to:: Patient Represenative (must comment) Choice offered to / list presented to : Adult Children  Expected Discharge Plan and Services Expected Discharge Plan: [place] In-house Referral: Clinical Social Work   Post Acute Care Choice: [place] Living arrangements for the past 2 months: [place]                                      Prior Living Arrangements/Services Living arrangements for the past 2 months: [place] Lives with:: Facility Resident Patient language and need for interpreter reviewed:: Yes Do you feel safe going back to the place where you live?: No   ALF feel he needs short term  rehab before he is able to return back home.  Need for Family Participation in Patient Care: Yes (Comment) Care giver support system in place?: Yes (comment)   Criminal Activity/Legal Involvement Pertinent to Current Situation/Hospitalization: No - Comment as needed  Activities of Daily Living Home Assistive Devices/Equipment: Blood pressure cuff, Eyeglasses, Grab bars around toilet, Grab bars in shower, Hand-held shower hose, Walker (specify type), Scales, Wheelchair(rollator-[facility] has necessary equipment for their residents) ADL Screening (condition at time of admission) Patient's cognitive ability adequate to safely complete daily activities?: No(patient has dementia) Is the patient deaf or have difficulty hearing?: No Does the patient have difficulty seeing, even when wearing glasses/contacts?: No Does the patient have difficulty concentrating, remembering, or making decisions?: Yes Patient able to express need for assistance with ADLs?: No Does the patient have difficulty dressing or bathing?: Yes Independently performs ADLs?: No Communication: Independent Dressing (OT): Needs assistance Is this a change from baseline?: Pre-admission baseline Grooming: Needs assistance Is this a change from baseline?: Pre-admission baseline Feeding: Needs assistance Is this a change from baseline?: Pre-admission baseline Bathing: Needs assistance Is this a change from baseline?: Pre-admission baseline Toileting: Needs assistance Is this a change from baseline?: Pre-admission baseline In/Out Bed: Needs assistance Is this a change from baseline?: Pre-admission baseline Walks in Home: Needs assistance Is this a change from baseline?: Pre-admission  baseline Does the patient have difficulty walking or climbing stairs?: Yes(secondary to weakness) Weakness of Legs: Both Weakness of Arms/Hands: Both  Permission Sought/Granted Permission sought to share information with : Facility Automotive engineer, Family Supports Permission granted to share information with : Yes, Release of Information Signed  Share Information with NAME: Philpott,Anita Daughter   [phone] or Seate,Whitney Daughter   [phone] or [place]ple   [phone]  Permission granted to share info w AGENCY: SNF admissions        Emotional Assessment Appearance:: Appears stated age   Affect (typically observed): Accepting, Appropriate, Calm, Stable Orientation: : Oriented to Self Alcohol / Substance Use: Not Applicable Psych Involvement: No (comment)  Admission diagnosis:  Hypoxia [R09.02] Pneumonia due to COVID-19 virus [U07.1, J12.82] COVID-19 [U07.1] Patient Active Problem List   Diagnosis Date Noted  . Altered mental status 01/15/2020  . Pneumonia due to COVID-19 virus 01/14/2020  . COVID-19 virus infection 12/19/2019  . Protein-calorie malnutrition, severe ([place]) 12/19/2019  . HCAP (healthcare-associated pneumonia) 11/22/2018  . Fall 11/22/2018  . Chronic diastolic CHF (congestive heart failure) ([place]) 11/22/2018  . Hypoxia   . Bradycardia 10/28/2018  . Leg swelling 10/28/2018  . Irregular heart beat 09/08/2018  . Sleep disturbance   . Stage 3 chronic kidney disease   . Benign essential HTN   . Hypokalemia   . Hyponatremia   . Infarction of right basal ganglia ([place]) 04/06/2018  . Pressure injury of skin 04/06/2018  . Vitamin B12 deficiency   . Hypothyroid   . Crohn's disease with complication ([place])   . Benign prostatic hyperplasia   . Acute lower UTI   . Dementia without behavioral disturbance ([place])   . Acute ischemic stroke ([place]) 04/05/2018  . Leukocytosis   . Shortness of breath   . Acute encephalopathy   . CVA (cerebral vascular accident) ([place]) 04/03/2018  . Calculus, kidney 01/21/2018  . Acute blood loss anemia   . Mallory-Weiss syndrome   . Esophageal stricture   . Respiratory failure ([place])   . UGIB (upper gastrointestinal bleed) 10/16/2017  . GERD  (gastroesophageal reflux disease) 10/16/2017  . Essential hypertension, benign 10/16/2017  . Chest pain 10/15/2017  . Hypothyroidism (acquired) 06/08/2017  . Chronic kidney disease, stage III (moderate) 03/19/2016  . Dyslipidemia 03/19/2016  . Degeneration of lumbar or lumbosacral intervertebral disc 07/10/2008  . Arthropathy, lower leg 05/23/2004   PCP:  Etter Sjogren, DO Pharmacy:   [facility], [place] [facility] [address] [place] [address] Phone: [phone] Fax: [phone]     Social Determinants of Health (SDOH) Interventions    Readmission Risk Interventions No flowsheet data found.

## 2020-01-18 NOTE — Discharge Summary (Signed)
Physician Discharge Summary  Anthony Hayes OZH:[id number] DOB: 1935-02-14 DOA: 01/14/2020  PCP: Etter Sjogren, DO  Admit date: 01/14/2020 Discharge date: 01/18/2020  Admitted From: [facility] Disposition:  ALF (with home health services)   Recommendations for Outpatient Follow-up:  1. Augmentin twice daily for 3 days prescribed to complete a course of 7 days for possible secondary bacterial infection.  Dexamethasone 6 mg daily for 6 days prescribed to complete a course of 10 days for COVID-19 pneumonia. 2. As the patient has already tested positive for COVID-19, he is not a candidate for the COVID-19 vaccine for at least 3 months. 3. Hydralazine was discontinued as his BP was normal on amlodipine only.  Please monitor BP and add hydralazine if needed. 4. Recommend home health services for physical therapy, occupational therapy.  Home Health: Yes (to be ordered by the facility)  Equipment/Devices: None   Discharge Condition: Stable CODE STATUS: DNR Diet recommendation: regular diet   Brief/Interim Summary: 85 year old Caucasian male with PMH of dementia, hypothyroidism, hypertension, GERD, depression, BPH, TIA, recently diagnosed with COVID-19 on 12/19/2019 requiring hospital admission when he completed a course of Decadron/remdesivir and was discharged to ALF on room air.  Returns to the hospital on 01/14/2020 with fever, worsening weakness and found to be hypoxic.  CTA chest showed changes consistent with atypical multifocal pneumonia but no PE.  Per daughters, patient had received the COVID-19 vaccine the day prior to presentation.  He was placed on 3 L oxygen via nasal cannula, subsequently weaned to room air prior to discharge.  Was also started on dexamethasone 6 mg daily.  Received ceftriaxone/azithromycin for 4 days.  He was noted to have sinus bradycardia which is baseline per his daughter.  He worked with physical therapy who recommended SNF.  After discussion with patient's  daughters, decision was made to discharge to ALF with home health PT services.  He has remained hemodynamically stable for last 24 to 48 hours and is saturating 92% on room air.  Discharge Diagnoses:  Principal Problem:   Pneumonia due to COVID-19 virus Active Problems:   Respiratory failure ([place])   Hypothyroid   Dementia without behavioral disturbance (HCC)   Hypokalemia   Altered mental status   Discharge Instructions:  Discharge Instructions    Diet - low sodium heart healthy   Complete by: As directed    Increase activity slowly   Complete by: As directed      Allergies as of 01/18/2020      Reactions   Lisinopril Swelling   Swelling of lips      Medication List    STOP taking these medications   diphenhydrAMINE 12.5 MG/5ML elixir Commonly known as: BENADRYL   hydrALAZINE 25 MG tablet Commonly known as: APRESOLINE     TAKE these medications   acetaminophen 325 MG tablet Commonly known as: TYLENOL Take 650 mg by mouth every 6 (six) hours as needed for mild pain.   amLODipine 5 MG tablet Commonly known as: NORVASC Take 1 tablet (5 mg total) by mouth daily.   amoxicillin-clavulanate 875-125 MG tablet Commonly known as: Augmentin Take 1 tablet by mouth 2 (two) times daily for 3 days. Start taking on: January 19, 2020   ascorbic acid 500 MG tablet Commonly known as: VITAMIN C Take 1 tablet (500 mg total) by mouth daily. Start taking on: January 19, 2020   clopidogrel 75 MG tablet Commonly known as: PLAVIX Take 1 tablet (75 mg total) by mouth daily.   cyanocobalamin 1000  MCG/ML injection Commonly known as: (VITAMIN B-12) Inject 1,000 mcg into the muscle every 30 (thirty) days.   dexamethasone 6 MG tablet Commonly known as: Decadron Take 1 tablet (6 mg total) by mouth daily for 6 days.   levothyroxine 50 MCG tablet Commonly known as: SYNTHROID Take 50 mcg by mouth daily before breakfast.   Melatonin 3 MG Subl Place 6 mg under the tongue at  bedtime.   pantoprazole 40 MG tablet Commonly known as: PROTONIX Take 1 tablet (40 mg total) by mouth daily.   pravastatin 10 MG tablet Commonly known as: Pravachol Take 1 tablet (10 mg total) by mouth daily.   sertraline 25 MG tablet Commonly known as: ZOLOFT Take 25 mg by mouth daily. Take with 145m zoloft to = 1247m  sertraline 100 MG tablet Commonly known as: ZOLOFT Take 1 tablet by mouth daily.   Vitamin D3 25 MCG (1000 UT) Caps Take 1,000 Units by mouth daily.   zinc sulfate 220 (50 Zn) MG capsule Take 1 capsule (220 mg total) by mouth daily. Start taking on: January 19, 2020       Allergies  Allergen Reactions  . Lisinopril Swelling    Swelling of lips     Consultations:  None   Procedures/Studies: CT Angio Chest PE W and/or Wo Contrast  Result Date: 01/14/2020 CLINICAL DATA:  [age]ear old male with shortness of breath. Elevated D-dimer. EXAM: CT ANGIOGRAPHY CHEST WITH CONTRAST TECHNIQUE: Multidetector CT imaging of the chest was performed using the standard protocol during bolus administration of intravenous contrast. Multiplanar CT image reconstructions and MIPs were obtained to evaluate the vascular anatomy. CONTRAST:  8043mMNIPAQUE IOHEXOL 350 MG/ML SOLN COMPARISON:  Chest CT dated 12/19/2019. FINDINGS: Evaluation of this exam is limited due to respiratory motion artifact. Cardiovascular: There is mild cardiomegaly. No pericardial effusion. Three-vessel coronary vascular calcifications noted. There is mild atherosclerotic calcification of the thoracic aorta. No aneurysmal dilatation or dissection. The origins of the great vessels of the aortic arch appear patent as visualized. Evaluation of the pulmonary arteries is limited due to respiratory motion artifact. No large or central pulmonary artery embolus identified. Mediastinum/Nodes: There is no hilar or mediastinal adenopathy. There is a small hiatal hernia. The esophagus is grossly unremarkable. No  mediastinal fluid collection. Lungs/Pleura: Bilateral peripheral and subpleural clusters of ground-glass and hazy airspace densities most consistent with multifocal pneumonia likely viral or atypical etiology including COVID-19. Clinical correlation and follow-up recommended. There is no pleural effusion or pneumothorax. The central airways are patent. Upper Abdomen: No acute abnormality. Musculoskeletal: Osteopenia with degenerative changes of the spine. No acute osseous pathology. Review of the MIP images confirms the above findings. IMPRESSION: 1. No CT evidence of central pulmonary artery embolus. 2. Multifocal pneumonia, likely viral or atypical in etiology including COVID-19. Clinical correlation is recommended. Electronically Signed   By: AraAnner CreteD.   On: 01/14/2020 18:16   CT Angio Chest PE W and/or Wo Contrast  Result Date: 12/19/2019 CLINICAL DATA:  Shortness of breath, COVID positive EXAM: CT ANGIOGRAPHY CHEST WITH CONTRAST TECHNIQUE: Multidetector CT imaging of the chest was performed using the standard protocol during bolus administration of intravenous contrast. Multiplanar CT image reconstructions and MIPs were obtained to evaluate the vascular anatomy. CONTRAST:  100m57mNIPAQUE IOHEXOL 350 MG/ML SOLN COMPARISON:  None. FINDINGS: Cardiovascular: There is slightly suboptimal opacification of the main pulmonary artery. However there is no filling defect in the central or segmental pulmonary arteries. There is mild cardiomegaly. No evidence of right  ventricular heart strain. No pericardial effusion. There is normal three-vessel brachiocephalic anatomy without proximal stenosis. The thoracic aorta is normal in appearance. Coronary artery calcifications are seen. Mediastinum/Nodes: No hilar, mediastinal, or axillary adenopathy. Thyroid gland, trachea, and esophagus demonstrate no significant findings. Lungs/Pleura: Patchy/ground-glass peripherally based airspace opacities are seen  throughout both lungs, predominantly within the right upper lung and lingula. There is also streaky airspace opacity at both lung bases. No pleural effusion or pneumothorax. Upper Abdomen: There is a small hiatal hernia. No acute abnormalities present in the visualized portions of the upper abdomen. Musculoskeletal: No chest wall abnormality. No acute or significant osseous findings. Review of the MIP images confirms the above findings. IMPRESSION: Slightly suboptimal opacification of the main pulmonary artery. No central or segmental pulmonary embolism. Multifocal patchy/ground-glass opacities throughout both lungs, consistent with COVID pneumonia. Aortic Atherosclerosis (ICD10-I70.0). Electronically Signed   By: Prudencio Pair M.D.   On: 12/19/2019 22:17   DG Chest Port 1 View  Result Date: 01/14/2020 CLINICAL DATA:  Fever, cough and low oxygen saturation. COVID positive patient. EXAM: PORTABLE CHEST 1 VIEW COMPARISON:  December 22, 2019 FINDINGS: Enlarged cardiac silhouette. Calcific atherosclerotic disease and tortuosity of the aorta. Mild patchy airspace consolidation in the left lung base. Improvement in the previously demonstrated bilateral upper lobe peripheral consolidation. Osseous structures are without acute abnormality. Soft tissues are grossly normal. IMPRESSION: 1. Mild patchy airspace consolidation in the left lung base. 2. Improvement in the previously demonstrated bilateral upper lobe peripheral consolidation. 3. Enlarged cardiac silhouette. Electronically Signed   By: Fidela Salisbury M.D.   On: 01/14/2020 16:32   DG CHEST PORT 1 VIEW  Result Date: 12/22/2019 CLINICAL DATA:  85 year old male with shortness of breath. Positive COVID-19. EXAM: PORTABLE CHEST 1 VIEW COMPARISON:  Portable chest 12/21/2019. CT chest 12/19/2019, and earlier. FINDINGS: Portable AP semi upright view at 0418 hours. Larger lung volumes. Stable cardiomegaly and mediastinal contours. Scattered peripheral indistinct  and interstitial pulmonary opacities redemonstrated and stable to mildly regressed since 12/19/2019. No areas of worsening ventilation. No pneumothorax, pulmonary edema or pleural effusion. Stable trachea. No acute osseous abnormality identified. IMPRESSION: 1. Larger lung volumes. Scattered bilateral peripheral and indistinct opacity in keeping with COVID-19 pneumonia appears stable to mildly regressed since 12/19/2019. 2. No new cardiopulmonary abnormality. Electronically Signed   By: Genevie Ann M.D.   On: 12/22/2019 05:18   DG CHEST PORT 1 VIEW  Result Date: 12/21/2019 CLINICAL DATA:  Shortness of breath. EXAM: PORTABLE CHEST 1 VIEW COMPARISON:  12/19/2019 FINDINGS: The heart is mildly enlarged but stable. Stable tortuosity, ectasia and calcification of the thoracic aorta. Persistent patchy bilateral hazy infiltrates consistent with COVID pneumonia. No pleural effusions. IMPRESSION: Persistent patchy bilateral lung infiltrates. Electronically Signed   By: Marijo Sanes M.D.   On: 12/21/2019 07:28   DG Chest Port 1 View  Result Date: 12/19/2019 CLINICAL DATA:  Covert pneumonia.  Hypoxia. EXAM: PORTABLE CHEST 1 VIEW COMPARISON:  11/21/2018 FINDINGS: Mild cardiomegaly. Indistinct peripheral airspace opacity noted in the right upper lobe and peripherally in the left mid lung. Suspected mild bibasilar atelectasis. Atherosclerotic calcification of the aortic arch. No pleural effusion is identified. IMPRESSION: 1. Hazy an indistinctly marginated peripheral airspace opacities in the right upper lobe and left mid lung favoring pneumonia over mild edema. 2. Mild enlargement of the cardiopericardial silhouette. Electronically Signed   By: Van Clines M.D.   On: 12/19/2019 18:58       Subjective: Says he is doing fine.  Having lunch.  Discharge Exam: Vitals:   01/17/20 1317 01/18/20 0510  BP: 129/61 119/80  Pulse: (!) 42 (!) 42  Resp: 16 19  Temp: 98.7 F (37.1 C) (!) 97.5 F (36.4 C)  SpO2:  94% 92%   Vitals:   01/16/20 1953 01/17/20 0437 01/17/20 1317 01/18/20 0510  BP: 131/86 129/87 129/61 119/80  Pulse: (!) 41 (!) 42 (!) 42 (!) 42  Resp: 19 18 16 19   Temp: 98.2 F (36.8 C) 97.6 F (36.4 C) 98.7 F (37.1 C) (!) 97.5 F (36.4 C)  TempSrc: Oral Oral Oral Oral  SpO2: 93% 95% 94% 92%  Weight:      Height:       General exam: Appears calm and comfortable, elderly Respiratory system: Clear to auscultation. Respiratory effort normal. Cardiovascular system: S1 & S2 heard, RRR. No JVD, murmurs. No pedal edema. Gastrointestinal system: Abdomen is nondistended, soft and nontender. Normal bowel sounds heard. Central nervous system: Alert and oriented to self. No focal obvious neurological deficits. Extremities: Moves all extremities voluntarily. Skin: No rashes, lesions or ulcers Psychiatry:  Flat affect   The results of significant diagnostics from this hospitalization (including imaging, microbiology, ancillary and laboratory) are listed below for reference.     Microbiology: Recent Results (from the past 240 hour(s))  Blood Culture (routine x 2)     Status: None (Preliminary result)   Collection Time: 01/14/20  3:53 PM   Specimen: BLOOD  Result Value Ref Range Status   Specimen Description   Final    BLOOD LEFT ANTECUBITAL Performed at [facility] [address] [place], [place] [address]    Special Requests   Final    BOTTLES DRAWN AEROBIC AND ANAEROBIC Blood Culture adequate volume Performed at [facility] [address], [place], [place] [address]    Culture   Final    NO GROWTH 4 DAYS Performed at [facility], [place] [address], [place], [place] [address]    Report Status PENDING  Incomplete  Blood Culture (routine x 2)     Status: None (Preliminary result)   Collection Time: 01/14/20  3:58 PM   Specimen: BLOOD  Result Value Ref Range Status   Specimen Description   Final    BLOOD RIGHT  ANTECUBITAL Performed at [facility] [address], [place], [place] [address]    Special Requests   Final    BOTTLES DRAWN AEROBIC AND ANAEROBIC Blood Culture adequate volume Performed at [facility] [address], [place], [place] [address]    Culture   Final    NO GROWTH 4 DAYS Performed at [facility], [place] [address], [place],  [address]    Report Status PENDING  Incomplete  SARS CORONAVIRUS 2 (TAT 6-24 HRS) Nasopharyngeal Nasopharyngeal Swab     Status: Abnormal   Collection Time: 01/17/20  6:13 PM   Specimen: Nasopharyngeal Swab  Result Value Ref Range Status   SARS Coronavirus 2 POSITIVE (A) NEGATIVE Final    Comment: RESULT CALLED TO, READ BACK BY AND VERIFIED WITH: RN TONY PAULINO AT 0430  BY MESSAN HOUEGNIFIO ON 01/18/2020 (NOTE) SARS-CoV-2 target nucleic acids are DETECTED. The SARS-CoV-2 RNA is generally detectable in upper and lower respiratory specimens during the acute phase of infection. Positive results are indicative of the presence of SARS-CoV-2 RNA. Clinical correlation with patient history and other diagnostic information is  necessary to determine patient infection status. Positive results do not rule out bacterial  infection or co-infection with other viruses.  The expected result is Negative. Fact Sheet for Patients: SugarRoll.be Fact Sheet for Healthcare Providers: https://www.woods-mathews.com/ This test is not yet approved or cleared by the [place] FDA and  has been authorized for detection and/or diagnosis of SARS-CoV-2 by FDA under an Emergency Use Authorization (EUA). This EUA will remain  in effect (meaning this te st can be used) for the duration of the COVID-19 declaration under Section 564(b)(1) of the Act, 21 U.S.C. section 360bbb-3(b)(1), unless the authorization is terminated or revoked sooner. Performed at [facility], [place]  [address], [place], [place] [address]      Labs: BNP (last 3 results) No results for input(s): BNP in the last 8760 hours. Basic Metabolic Panel: Recent Labs  Lab 01/14/20 1553 01/14/20 1553 01/14/20 2200 01/15/20 0631 01/16/20 0155 01/17/20 0145 01/18/20 0215  NA 142  --   --  140 139 134* 135  K 3.2*  --   --  3.3* 4.0 3.8 3.5  CL 109  --   --  110 107 102 102  CO2 23  --   --  21* 22 22 22   GLUCOSE 104*  --   --  124* 143* 134* 128*  BUN 21  --   --  24* 21 27* 23  CREATININE 1.05   < > 1.06 0.89 0.94 0.87 0.97  CALCIUM 8.6*  --   --  8.7* 9.0 8.9 8.6*   < > = values in this interval not displayed.   Liver Function Tests: Recent Labs  Lab 01/14/20 1553 01/15/20 0631 01/16/20 0155 01/17/20 0145 01/18/20 0215  AST 21 21 30 24  32  ALT 16 17 21 18 23   ALKPHOS 78 67 75 69 78  BILITOT 0.4 0.7 0.4 0.6 0.6  PROT 6.4* 6.0* 6.6 6.0* 6.3*  ALBUMIN 2.6* 2.5* 2.7* 2.7* 2.8*   No results for input(s): LIPASE, AMYLASE in the last 168 hours. No results for input(s): AMMONIA in the last 168 hours. CBC: Recent Labs  Lab 01/14/20 1553 01/14/20 1553 01/14/20 2200 01/15/20 0631 01/16/20 0155 01/17/20 0145 01/18/20 0215  WBC 6.9   < > 6.6 5.6 7.1 7.5 6.8  NEUTROABS 6.0  --   --  5.0 6.2 6.7 6.0  HGB 10.4*   < > 10.5* 9.5* 11.1* 10.1* 10.3*  HCT 31.5*   < > 32.1* 29.7* 35.5* 31.1* 32.1*  MCV 90.3   < > 91.7 92.8 93.7 90.1 89.4  PLT 401*   < > 373 325 347 362 416*   < > = values in this interval not displayed.   Cardiac Enzymes: No results for input(s): CKTOTAL, CKMB, CKMBINDEX, TROPONINI in the last 168 hours. BNP: Invalid input(s): POCBNP CBG: No results for input(s): GLUCAP in the last 168 hours. D-Dimer Recent Labs    01/17/20 0145 01/18/20 0215  DDIMER 0.86* 0.68*   Hgb A1c No results for input(s): HGBA1C in the last 72 hours. Lipid Profile No results for input(s): CHOL, HDL, LDLCALC, TRIG, CHOLHDL, LDLDIRECT in the last 72 hours. Thyroid function studies No  results for input(s): TSH, T4TOTAL, T3FREE, THYROIDAB in the last 72 hours.  Invalid input(s): FREET3 Anemia work up Recent Labs    01/17/20 0145 01/18/20 0215  FERRITIN 567* 402*   Urinalysis    Component Value Date/Time   COLORURINE AMBER (A) 12/19/2019 1925   APPEARANCEUR CLEAR 12/19/2019 1925   LABSPEC 1.026 12/19/2019 1925   PHURINE 5.0 12/19/2019 1925  GLUCOSEU NEGATIVE 12/19/2019 1925   HGBUR NEGATIVE 12/19/2019 1925   BILIRUBINUR NEGATIVE 12/19/2019 1925   BILIRUBINUR NEG 01/21/2018 2022   KETONESUR NEGATIVE 12/19/2019 1925   PROTEINUR 100 (A) 12/19/2019 1925   UROBILINOGEN 1.0 01/21/2018 2022   NITRITE NEGATIVE 12/19/2019 1925   LEUKOCYTESUR MODERATE (A) 12/19/2019 1925   Sepsis Labs Invalid input(s): PROCALCITONIN,  WBC,  LACTICIDVEN Microbiology Recent Results (from the past 240 hour(s))  Blood Culture (routine x 2)     Status: None (Preliminary result)   Collection Time: 01/14/20  3:53 PM   Specimen: BLOOD  Result Value Ref Range Status   Specimen Description   Final    BLOOD LEFT ANTECUBITAL Performed at [facility], [place] [address], [place], [place] [address]    Special Requests   Final    BOTTLES DRAWN AEROBIC AND ANAEROBIC Blood Culture adequate volume Performed at [facility] [address], [place], [place] [address]    Culture   Final    NO GROWTH 4 DAYS Performed at [facility], [place] [address] [place], [place] [address]    Report Status PENDING  Incomplete  Blood Culture (routine x 2)     Status: None (Preliminary result)   Collection Time: 01/14/20  3:58 PM   Specimen: BLOOD  Result Value Ref Range Status   Specimen Description   Final    BLOOD RIGHT ANTECUBITAL Performed at [facility] [address] [place], [place] [address]    Special Requests   Final    BOTTLES DRAWN AEROBIC AND ANAEROBIC Blood Culture adequate volume Performed at [facility] [address], [place], [place] [address]    Culture   Final    NO GROWTH 4 DAYS Performed at [facility], [place] [address] [place], [place] [address]    Report Status PENDING  Incomplete  SARS CORONAVIRUS 2 (TAT 6-24 HRS) Nasopharyngeal Nasopharyngeal Swab     Status: Abnormal   Collection Time: 01/17/20  6:13 PM   Specimen: Nasopharyngeal Swab  Result Value Ref Range Status   SARS Coronavirus 2 POSITIVE (A) NEGATIVE Final    Comment: RESULT CALLED TO, READ BACK BY AND VERIFIED WITH: RN TONY PAULINO AT 0430  BY MESSAN HOUEGNIFIO ON 01/18/2020 (NOTE) SARS-CoV-2 target nucleic acids are DETECTED. The SARS-CoV-2 RNA is generally detectable in upper and lower respiratory specimens during the acute phase of infection. Positive results are indicative of the presence of SARS-CoV-2 RNA. Clinical correlation with patient history and other diagnostic information is  necessary to determine patient infection status. Positive results do not rule out bacterial infection or co-infection with other viruses.  The expected result is Negative. Fact Sheet for Patients: SugarRoll.be Fact Sheet for Healthcare Providers: https://www.woods-mathews.com/ This test is not yet approved or cleared by the [place] FDA and  has been authorized for detection and/or diagnosis of SARS-CoV-2 by FDA under an Emergency Use Authorization (EUA). This EUA will remain  in effect (meaning this te st can be used) for the duration of the COVID-19 declaration under Section 564(b)(1) of the Act, 21 U.S.C. section 360bbb-3(b)(1), unless the authorization is terminated or revoked sooner. Performed at [facility], [place] [address] [place], [place] [address]      Time coordinating discharge: Over 30 minutes  SIGNED:   Lucky Cowboy, MD  [facility] 01/18/2020, 1:44 PM  If 7PM-7AM, please contact night-coverage

## 2020-01-18 NOTE — Progress Notes (Signed)
OT Cancellation Note  Patient Details Name: Anthony Hayes MRN: [id number] DOB: 1935-07-03   Cancelled Treatment:    Reason Eval/Treat Not Completed: Other (comment)   Noted plan is back to ALF- will defer OT eval back to [place], [place] [phone] Office- [phone], Edwena Felty D 01/18/2020, 1:52 PM

## 2020-01-18 NOTE — Progress Notes (Signed)
Attempted to call report to Legrand Como at [facility] care.  Left message.

## 2020-01-18 NOTE — TOC Transition Note (Addendum)
Transition of Care [facility]) - CM/SW Discharge Note   Patient Details  Name: Anthony Hayes MRN: [id number] Date of Birth: [birth year]  Transition of [facility] CM/SW Contact:  Ross Ludwig, LCSW Phone Number: 01/18/2020, 3:59 PM   Clinical Narrative:    Patient to be d/c'ed today to [facility].  Patient and family agreeable to plans will transport via ems RN to call report to Legrand Como at [phone].  Patient's daughters are aware of patient discharging today.      Final next level of care: Assisted Living Barriers to Discharge: Barriers Resolved   Patient Goals and CMS Choice Patient states their goals for this hospitalization and ongoing recovery are:: Patient will be discharging back to ALF CMS Medicare.gov Compare Post Acute Care list provided to:: Patient Represenative (must comment) Choice offered to / list presented to : Adult Children  Discharge Placement              Patient chooses bed at: [facility] Patient to be transferred to facility by: [facility] Name of family member notified: [name] and [name], patient's daughters Patient and family notified of of transfer: 01/18/20  Discharge Plan and Services In-house Referral: Clinical Social Work   Post Acute Care Choice: [place]          DME Arranged: N/A           HH Agency: NA     Representative spoke with at [facility]: na  Social Determinants of Health ([place]) Interventions     Readmission Risk Interventions No flowsheet data found.

## 2020-01-18 NOTE — Progress Notes (Signed)
Physical Therapy Treatment Patient Details Name: Anthony Hayes MRN: [id number] DOB: 1935-12-03 Today's Date: 01/18/2020    History of Present Illness Pt admitted with Covid+ PNA on 1/17, pt with recent acute care stay for similar diagnosis. PMH of dementia, TIA, lumbar fusion, L RCR CKD and colectomy    PT Comments    Pt with flat affect this session, which pt attributes to being fatigued. Pt required increased physical assist to perform bed mobility and sitting balance activities today, pt with more significant truncal fatigue noted. Pt tolerated limited LE supine and sitting exercises, but not agreeable to transfer OOB due to fatigue and LE weakness. PT placed pt in chair position for pressure relief and pulmonary health. PT to continue to follow acutely, recommending SNF vs HHPT at ALF depending on what family and pt decide. At this point, pt feels more appropriate for SNF level of care.    Follow Up Recommendations  Home health PT;Supervision/Assistance - 24 hour;SNF     Equipment Recommendations  None recommended by PT    Recommendations for Other Services       Precautions / Restrictions Precautions Precautions: Fall Restrictions Weight Bearing Restrictions: No    Mobility  Bed Mobility Overal bed mobility: Needs Assistance Bed Mobility: Supine to Sit;Sit to Supine     Supine to sit: Max assist;HOB elevated Sit to supine: Max assist;HOB elevated   General bed mobility comments: max assist for supine<>sit for trunk and LE management, scooting to and from EOB, and positioning in bed upon return to supine with use of flat bed and bed pads.  Transfers                 General transfer comment: unable to attempt, pt unable to maintain sitting balance or tolerate mobility progression  Ambulation/Gait                 Stairs             Wheelchair Mobility    Modified Rankin (Stroke Patients Only)       Balance Overall balance assessment: Needs  assistance;History of Falls Sitting-balance support: Bilateral upper extremity supported;Feet supported Sitting balance-[name] Scale: Poor Sitting balance - Comments: heavy posterior and L lateral leaning, corrected with PT assist and use of chair back in front of pt for him to hold onto. Unsuccessful in correcting posterior leaning with UE support this session, as today pt cannot sit without UE and PT support. Sat EOB with support ~10 minutes Postural control: Posterior lean;Left lateral lean Standing balance support: Bilateral upper extremity supported;During functional activity Standing balance-[name] Scale: Zero                              Cognition Arousal/Alertness: Awake/alert Behavior During Therapy: Flat affect Overall Cognitive Status: History of cognitive impairments - at baseline                                 General Comments: pt with history of dementia. Pt oriented to self and speaks of daughters. Pt appears uninterested in mobility this session.      Exercises General Exercises - Lower Extremity Ankle Circles/Pumps: Both;Supine;10 reps;PROM Long Arc Quad: AROM;Both;Seated(8 repetitions, limited by fatigue) Heel Slides: AAROM;Both;10 reps;Supine    General Comments General comments (skin integrity, edema, etc.): SpO2 92% and above on RA      Pertinent Vitals/Pain Pain Assessment:  No/denies pain Faces Pain Scale: No hurt    Home Living                      Prior Function            PT Goals (current goals can now be found in the care plan section) Acute Rehab PT Goals Patient Stated Goal: per family - back to memory care with [facility] services PT Goal Formulation: With patient/family Time For Goal Achievement: 01/31/20 Potential to Achieve Goals: Fair Progress towards PT goals: Progressing toward goals    Frequency    Min 3X/week      PT Plan Discharge plan needs to be updated    Co-evaluation               AM-PAC PT "6 Clicks" Mobility   Outcome Measure  Help needed turning from your back to your side while in a flat bed without using bedrails?: Total Help needed moving from lying on your back to sitting on the side of a flat bed without using bedrails?: Total Help needed moving to and from a bed to a chair (including a wheelchair)?: Total Help needed standing up from a chair using your arms (e.g., wheelchair or bedside chair)?: Total Help needed to walk in hospital room?: Total Help needed climbing 3-5 steps with a railing? : Total 6 Click Score: 6    End of Session Equipment Utilized During Treatment: Gait belt Activity Tolerance: Patient limited by fatigue Patient left: with call bell/phone within reach;in bed;with bed alarm set Nurse Communication: Mobility status PT Visit Diagnosis: Difficulty in walking, not elsewhere classified (R26.2);Muscle weakness (generalized) (M62.81)     Time: 6222-9798 PT Time Calculation (min) (ACUTE ONLY): 25 min  Charges:  $Therapeutic Exercise: 8-22 mins $Therapeutic Activity: 8-22 mins                     Luwanda Starr E, PT Acute Rehabilitation Services Pager [phone]  Office [phone]  Xavier Munger D Elonda Husky 01/18/2020, 2:57 PM

## 2020-01-18 NOTE — Progress Notes (Signed)
Report called to Legrand Como at [facility]. Awaiting PTAR to transport.

## 2020-01-18 NOTE — NC FL2 (Addendum)
[place] LEVEL OF CARE SCREENING TOOL     IDENTIFICATION  Patient Name: Anthony Hayes Birthdate: 01-09-1935 Sex: male Admission Date (Current Location): 01/14/2020  [facility] and [place] Number:  Herbalist and Address:  [facility],  [place], [place]      Provider Number: [id number]  Attending Physician Name and Address:  Lucky Cowboy, MD  Relative Name and Phone Number:  Philpott,Anita Daughter   [phone] or Seate,Whitney Daughter   [phone] or Sula Rumple   [phone]    Current Level of Care: Hospital Recommended Level of Care: ALF Memory Care Prior Approval Number:    Date Approved/Denied:   PASRR Number: [id number]  Discharge Plan: ALF ([facility] memory care.   Current Diagnoses: Patient Active Problem List   Diagnosis Date Noted  . Altered mental status 01/15/2020  . Pneumonia due to COVID-19 virus 01/14/2020  . COVID-19 virus infection 12/19/2019  . Protein-calorie malnutrition, severe ([place]) 12/19/2019  . HCAP (healthcare-associated pneumonia) 11/22/2018  . Fall 11/22/2018  . Chronic diastolic CHF (congestive heart failure) ([place]) 11/22/2018  . Hypoxia   . Bradycardia 10/28/2018  . Leg swelling 10/28/2018  . Irregular heart beat 09/08/2018  . Sleep disturbance   . Stage 3 chronic kidney disease   . Benign essential HTN   . Hypokalemia   . Hyponatremia   . Infarction of right basal ganglia ([place]) 04/06/2018  . Pressure injury of skin 04/06/2018  . Vitamin B12 deficiency   . Hypothyroid   . Crohn's disease with complication ([place])   . Benign prostatic hyperplasia   . Acute lower UTI   . Dementia without behavioral disturbance ([place])   . Acute ischemic stroke ([place]) 04/05/2018  . Leukocytosis   . Shortness of breath   . Acute encephalopathy   . CVA (cerebral vascular accident) ([place]) 04/03/2018  . Calculus, kidney 01/21/2018  . Acute blood loss anemia   . Mallory-Weiss syndrome    . Esophageal stricture   . Respiratory failure ([place])   . UGIB (upper gastrointestinal bleed) 10/16/2017  . GERD (gastroesophageal reflux disease) 10/16/2017  . Essential hypertension, benign 10/16/2017  . Chest pain 10/15/2017  . Hypothyroidism (acquired) 06/08/2017  . Chronic kidney disease, stage III (moderate) 03/19/2016  . Dyslipidemia 03/19/2016  . Degeneration of lumbar or lumbosacral intervertebral disc 07/10/2008  . Arthropathy, lower leg 05/23/2004    Orientation RESPIRATION BLADDER Height & Weight     Self  Normal Incontinent Weight: 159 lb 6.3 oz (72.3 kg) Height:  5' 8"  (172.7 cm)  BEHAVIORAL SYMPTOMS/MOOD NEUROLOGICAL BOWEL NUTRITION STATUS      Incontinent Diet(Heart Healthy regular diet)  AMBULATORY STATUS COMMUNICATION OF NEEDS Skin   Limited Assist Verbally PU Stage and Appropriate Care PU Stage 1 Dressing: (PRN dressing changes)                     Personal Care Assistance Level of Assistance  Bathing, Feeding, Dressing Bathing Assistance: Limited assistance Feeding assistance: Limited assistance Dressing Assistance: Limited assistance     Functional Limitations Info  Sight, Hearing, Speech Sight Info: Adequate Hearing Info: Adequate Speech Info: Adequate    SPECIAL CARE FACTORS FREQUENCY  PT (By licensed PT), OT (By licensed OT)      PT Frequency: Minimum 2x a week for home health OT Frequency: Minimum 2x a week for home health        Contractures Contractures Info: Not present    Additional Factors Info  Code  Status, Allergies, Psychotropic, Isolation Precautions Code Status Info: DNR Allergies Info: Lisinopril Psychotropic Info: sertraline (ZOLOFT) tablet 125 mg         Current Medications (01/18/2020):  This is the current hospital active medication list Current Facility-Administered Medications  Medication Dose Route Frequency Provider Last Rate Last Admin  . 0.9 %  sodium chloride infusion   Intravenous PRN Bunnie Pion Z, DO  10 mL/hr at 01/16/20 0600 Rate Verify at 01/16/20 0600  . acetaminophen (TYLENOL) tablet 650 mg  650 mg Oral Q6H PRN Bunnie Pion Z, DO      . albuterol (VENTOLIN HFA) 108 (90 Base) MCG/ACT inhaler 2 puff  2 puff Inhalation TID Lucky Cowboy, MD   2 puff at 01/18/20 [phone]  . albuterol (VENTOLIN HFA) 108 (90 Base) MCG/ACT inhaler 2 puff  2 puff Inhalation Q4H PRN Lucky Cowboy, MD      . amLODipine (NORVASC) tablet 5 mg  5 mg Oral Daily Lucky Cowboy, MD   5 mg at 01/18/20 0932  . ascorbic acid (VITAMIN C) tablet 500 mg  500 mg Oral Daily Bunnie Pion Z, DO   500 mg at 01/18/20 0272  . azithromycin (ZITHROMAX) tablet 500 mg  500 mg Oral Daily Lucky Cowboy, MD   500 mg at 01/18/20 5366  . cefTRIAXone (ROCEPHIN) 1 g in sodium chloride 0.9 % 100 mL IVPB  1 g Intravenous Q24H Lucky Cowboy, MD 200 mL/hr at 01/18/20 0828 1 g at 01/18/20 0828  . cholecalciferol (VITAMIN D3) tablet 1,000 Units  1,000 Units Oral Daily Lucky Cowboy, MD   1,000 Units at 01/18/20 0932  . clopidogrel (PLAVIX) tablet 75 mg  75 mg Oral Daily Lucky Cowboy, MD   75 mg at 01/18/20 0932  . dexamethasone (DECADRON) injection 6 mg  6 mg Intravenous Q24H Bunnie Pion Z, DO   6 mg at 01/17/20 2115  . enoxaparin (LOVENOX) injection 40 mg  40 mg Subcutaneous QHS Bunnie Pion Z, DO   40 mg at 01/17/20 2115  . levothyroxine (SYNTHROID) tablet 50 mcg  50 mcg Oral Q0600 Lucky Cowboy, MD   50 mcg at 01/18/20 0530  . Melatonin TABS 3 mg  3 mg Oral QHS Lucky Cowboy, MD   3 mg at 01/17/20 2115  . ondansetron (ZOFRAN) tablet 4 mg  4 mg Oral Q6H PRN Bunnie Pion Z, DO       Or  . ondansetron [facility]) injection 4 mg  4 mg Intravenous Q6H PRN Bunnie Pion Z, DO      . pantoprazole (PROTONIX) EC tablet 40 mg  40 mg Oral Daily Lucky Cowboy, MD   40 mg at 01/18/20 0932  . pravastatin (PRAVACHOL) tablet 10 mg  10 mg Oral q1800 Lucky Cowboy, MD   10 mg at 01/17/20 1809  . sertraline (ZOLOFT) tablet 125 mg  125 mg Oral Daily  Lucky Cowboy, MD   125 mg at 01/18/20 0932  . zinc sulfate capsule 220 mg  220 mg Oral Daily Bunnie Pion Z, DO   220 mg at 01/18/20 4403     Discharge Medications: STOP taking these medications   diphenhydrAMINE 12.5 MG/5ML elixir Commonly known as: BENADRYL   hydrALAZINE 25 MG tablet Commonly known as: APRESOLINE     TAKE these medications   acetaminophen 325 MG tablet Commonly known as: TYLENOL Take 650 mg by mouth every 6 (six) hours as needed for mild pain.   amLODipine 5  MG tablet Commonly known as: NORVASC Take 1 tablet (5 mg total) by mouth daily.   amoxicillin-clavulanate 875-125 MG tablet Commonly known as: Augmentin Take 1 tablet by mouth 2 (two) times daily for 3 days. Start taking on: January 19, 2020   ascorbic acid 500 MG tablet Commonly known as: VITAMIN C Take 1 tablet (500 mg total) by mouth daily. Start taking on: January 19, 2020   clopidogrel 75 MG tablet Commonly known as: PLAVIX Take 1 tablet (75 mg total) by mouth daily.   cyanocobalamin 1000 MCG/ML injection Commonly known as: (VITAMIN B-12) Inject 1,000 mcg into the muscle every 30 (thirty) days.   dexamethasone 6 MG tablet Commonly known as: Decadron Take 1 tablet (6 mg total) by mouth daily for 6 days.   levothyroxine 50 MCG tablet Commonly known as: SYNTHROID Take 50 mcg by mouth daily before breakfast.   Melatonin 3 MG Subl Place 6 mg under the tongue at bedtime.   pantoprazole 40 MG tablet Commonly known as: PROTONIX Take 1 tablet (40 mg total) by mouth daily.   pravastatin 10 MG tablet Commonly known as: Pravachol Take 1 tablet (10 mg total) by mouth daily.   sertraline 25 MG tablet Commonly known as: ZOLOFT Take 25 mg by mouth daily. Take with 168m zoloft to = 1261m  sertraline 100 MG tablet Commonly known as: ZOLOFT Take 1 tablet by mouth daily.   Vitamin D3 25 MCG (1000 UT) Caps Take 1,000 Units by mouth daily.   zinc sulfate 220 (50 Zn)  MG capsule Take 1 capsule (220 mg total) by mouth daily. Start taking on: January 19, 2020        Relevant Imaging Results:  Relevant Lab Results:   Additional Information SSN [id number] LudwigLCSW

## 2020-01-19 LAB — CULTURE, BLOOD (ROUTINE X 2)
Culture: NO GROWTH
Culture: NO GROWTH
Special Requests: ADEQUATE
Special Requests: ADEQUATE

## 2020-02-20 ENCOUNTER — Other Ambulatory Visit: Payer: Self-pay

## 2020-02-20 ENCOUNTER — Non-Acute Institutional Stay: Payer: Medicare Other | Admitting: Internal Medicine

## 2020-02-20 ENCOUNTER — Encounter: Payer: Self-pay | Admitting: Internal Medicine

## 2020-02-20 VITALS — Ht 66.0 in | Wt 161.8 lb

## 2020-02-20 DIAGNOSIS — Z7189 Other specified counseling: Secondary | ICD-10-CM

## 2020-02-20 DIAGNOSIS — Z515 Encounter for palliative care: Secondary | ICD-10-CM

## 2020-02-20 NOTE — Progress Notes (Signed)
Feb 23rd, 2021 [facility] Palliative Care Telephone: [phone] Fax: [phone]   PATIENT NAME: Anthony Hayes DOB: 04-19-1935 MRN: [id number] [place] [address], [place] [address]   PRIMARY CARE PROVIDER:   Farrel Conners NP ([facility])    [facility], [facility]. [address] [place], [place] [address] Dr. Jewel Baize [phone] Dr. Minus Breeding (Cardiology)   REFERRING PROVIDER:Thu Alfonse Spruce NP  RESPONSIBLE PARTY:  *(dtr) [name] [phone], (M) [phone]. Whitney [id number] [phone], [phone] (Rob). (S-I-L) Dione Booze [phone]    IMPRESSION/RECOMMENDATIONS: 1.Advance Care Planning:  A. Directives: (present in facility chart and [facility] EMR) DNR. MOST form details: DNR/DNI, Limited scope of medical interventions, Yes to Antibiotics and IVFs, feeding tube for a defined trial period.     B. Goals of Care: Daughters hoping for COVID restrictions to be lifted soon, so that they can visit their dad face to face.    2. Cognitive Functional Status: FAST 6e.  Patient is s/p 3 hospitalizations within the last 3 months. First sepsis from pneumonia, then 2 separate admissions for COVID pneumonia. Most recent hospital discharge was about 1 month ago. Staff report patient has suffered a progressive functional and cognitive decline over these last few months. He is less engaging, and answers in just a few words. He can follow simple commands. He is oriented to self. Couldn't remember the name of one of his daughters.  He is more somnolent and weaker. He often falls asleep at the dining table. He is now a consistent 2 person assist to transfer, and is wheelchair bound. He is incontinent of bowel and bladder. Staff report a decreased appetite. He may feed himself initially (for breakfast), but then need assist for rest of meals. Staff report patient coughs when drinking, or consuming his mechanical soft  diet. He has a speech therapy consult ordered for tomorrow. Current weight is 161.8 lbs, which is a loss of almost 8 lbs over the prior month. At 5'6" his BMI is 26.1 kg/m2.   3. Follow up: I plan to call daughter [name] with updates of today's visit. Will follow along every 1-2 months or so, or prn.   I spent 60 minutes providing this consultation from 810am - 11am.  More than 50% of the time in this consultation was spent coordinating communication, interviewing of staff and family, and reconciling SN MAR with EPIC med list.     HISTORY OF PRESENT ILLNESS:  Anthony Hayes is a 85 y.o. male with medical h/o hyperlipidemia, HTN, stroke (April 2019, Plavix), GERD, hypothyroidism, depression, dementia, arthritis, BPH, Bradycardia (Dr. Alba [facility]) and CKD 3 (creatinine 0.95).  admission  11/25-11/29/19 Hospitalization for sepsis secondary to pneumonia. He was discharged to [facility] for rehab, then readmitted to [facility] 12/16/2018.  12/22-12/28/20 Hospitalization for COVID pneumonia/UTI. 1/17-1/21/2021 Hospitalization for COVID pneumonia This is a follow up Palliative Care visit (from 12/28/2019)    CODE STATUS: DNR.    PPS:  30%   HOSPICE ELIGIBILITY/DIAGNOSIS: TBD  PAST MEDICAL HISTORY:  Past Medical History:  Diagnosis Date  . Arthritis    "joints" (10/15/2017)  . Benign prostatic hyperplasia   . Complication of anesthesia    "last OR in 06/2017 affected him cognitively; hasn't got back to baseline yet" (10/15/2017)  . Dementia ([place])   . Depression   . GERD (gastroesophageal reflux disease)   . High cholesterol   .  Hypertension   . Hypothyroidism   . Renal disease   . Renal disorder   . TIA (transient ischemic attack) early 2000s    SOCIAL HX:  Social History   Tobacco Use  . Smoking status: Former Smoker    Years: 7.00    Types: Cigarettes    Quit date: 1963    Years since quitting: 58.1  . Smokeless tobacco: Never Used   Substance Use Topics  . Alcohol use: Yes    Comment: 10/15/2017 "a few drinks/year"    ALLERGIES:  Allergies  Allergen Reactions  . Lisinopril Swelling    Swelling of lips      PERTINENT MEDICATIONS:  Outpatient Encounter Medications as of 02/20/2020  Medication Sig  . amLODipine (NORVASC) 5 MG tablet Take 1 tablet (5 mg total) by mouth daily.  Marland Kitchen ascorbic acid (VITAMIN C) 500 MG tablet Take 1 tablet (500 mg total) by mouth daily.  . Cholecalciferol (VITAMIN D3) 50 MCG (2000 UT) capsule Take by mouth daily.   . clopidogrel (PLAVIX) 75 MG tablet Take 1 tablet (75 mg total) by mouth daily.  . cyanocobalamin (,VITAMIN B-12,) 1000 MCG/ML injection Inject 1,000 mcg into the muscle every 30 (thirty) days.  Marland Kitchen levothyroxine (SYNTHROID) 50 MCG tablet Take 50 mcg by mouth daily before breakfast.  . Melatonin 3 MG SUBL Place 6 mg under the tongue at bedtime.  . pantoprazole (PROTONIX) 40 MG tablet Take 1 tablet (40 mg total) by mouth daily.  . pravastatin (PRAVACHOL) 10 MG tablet Take 1 tablet (10 mg total) by mouth daily.  . sertraline (ZOLOFT) 100 MG tablet Take 1 tablet by mouth daily.  . sertraline (ZOLOFT) 25 MG tablet Take 25 mg by mouth daily. Take with 173m zoloft to = 1289m . zinc sulfate 220 (50 Zn) MG capsule Take 1 capsule (220 mg total) by mouth daily.  . Marland Kitchencetaminophen (TYLENOL) 325 MG tablet Take 650 mg by mouth every 6 (six) hours as needed for mild pain.   No facility-administered encounter medications on file as of 02/20/2020.    PHYSICAL EXAM:   Frail appearing, slender elderly male who has a flatter affect than when I last saw him 3 weeks earlier. He can follow some simple commands. He is slowly feeding himself lunch Lung fields: posterior base inspiratory crackles. Cardiac: RRR, brady. Soft systolic murmur Extremities: no LE edema  Abd: soft, non-distended, NABS. Skin: no rashes exposed areas. Neurological: Weakness but otherwise non-focal.  MaJulianne Handler NP

## 2020-02-28 ENCOUNTER — Encounter (HOSPITAL_COMMUNITY): Payer: Self-pay | Admitting: Emergency Medicine

## 2020-02-28 ENCOUNTER — Emergency Department (HOSPITAL_COMMUNITY)
Admission: EM | Admit: 2020-02-28 | Discharge: 2020-02-29 | Disposition: A | Discharge status: Home / Self Care | Payer: Medicare Other | Attending: Emergency Medicine | Admitting: Emergency Medicine

## 2020-02-28 ENCOUNTER — Emergency Department (HOSPITAL_COMMUNITY): Payer: Medicare Other

## 2020-02-28 DIAGNOSIS — N183 Chronic kidney disease, stage 3 unspecified: Secondary | ICD-10-CM | POA: Insufficient documentation

## 2020-02-28 DIAGNOSIS — Z8616 Personal history of COVID-19: Secondary | ICD-10-CM | POA: Insufficient documentation

## 2020-02-28 DIAGNOSIS — Z87891 Personal history of nicotine dependence: Secondary | ICD-10-CM | POA: Insufficient documentation

## 2020-02-28 DIAGNOSIS — I130 Hypertensive heart and chronic kidney disease with heart failure and stage 1 through stage 4 chronic kidney disease, or unspecified chronic kidney disease: Secondary | ICD-10-CM | POA: Insufficient documentation

## 2020-02-28 DIAGNOSIS — R001 Bradycardia, unspecified: Secondary | ICD-10-CM | POA: Insufficient documentation

## 2020-02-28 DIAGNOSIS — Z79899 Other long term (current) drug therapy: Secondary | ICD-10-CM | POA: Insufficient documentation

## 2020-02-28 DIAGNOSIS — Z66 Do not resuscitate: Secondary | ICD-10-CM | POA: Insufficient documentation

## 2020-02-28 DIAGNOSIS — Z7902 Long term (current) use of antithrombotics/antiplatelets: Secondary | ICD-10-CM | POA: Insufficient documentation

## 2020-02-28 DIAGNOSIS — E039 Hypothyroidism, unspecified: Secondary | ICD-10-CM | POA: Diagnosis not present

## 2020-02-28 DIAGNOSIS — R410 Disorientation, unspecified: Secondary | ICD-10-CM | POA: Diagnosis present

## 2020-02-28 DIAGNOSIS — F0390 Unspecified dementia without behavioral disturbance: Secondary | ICD-10-CM | POA: Diagnosis not present

## 2020-02-28 DIAGNOSIS — I5032 Chronic diastolic (congestive) heart failure: Secondary | ICD-10-CM | POA: Diagnosis not present

## 2020-02-28 LAB — CBC
HCT: 36.7 % — ABNORMAL LOW (ref 39.0–52.0)
Hemoglobin: 12 g/dL — ABNORMAL LOW (ref 13.0–17.0)
MCH: 28.4 pg (ref 26.0–34.0)
MCHC: 32.7 g/dL (ref 30.0–36.0)
MCV: 87 fL (ref 80.0–100.0)
Platelets: 278 10*3/uL (ref 150–400)
RBC: 4.22 MIL/uL (ref 4.22–5.81)
RDW: 15 % (ref 11.5–15.5)
WBC: 6.2 10*3/uL (ref 4.0–10.5)
nRBC: 0 % (ref 0.0–0.2)

## 2020-02-28 LAB — BASIC METABOLIC PANEL
Anion gap: 12 (ref 5–15)
BUN: 22 mg/dL (ref 8–23)
CO2: 24 mmol/L (ref 22–32)
Calcium: 9.3 mg/dL (ref 8.9–10.3)
Chloride: 105 mmol/L (ref 98–111)
Creatinine, Ser: 1.12 mg/dL (ref 0.61–1.24)
GFR calc Af Amer: >60 mL/min (ref >60)
GFR calc non Af Amer: >60 mL/min (ref >60)
Glucose, Bld: 93 mg/dL (ref 70–99)
Potassium: 3.2 mmol/L — ABNORMAL LOW (ref 3.5–5.1)
Sodium: 141 mmol/L (ref 135–145)

## 2020-02-28 LAB — URINALYSIS, ROUTINE W REFLEX MICROSCOPIC
Bilirubin Urine: NEGATIVE
Glucose, UA: NEGATIVE mg/dL
Hgb urine dipstick: NEGATIVE
Ketones, ur: NEGATIVE mg/dL
Leukocytes,Ua: NEGATIVE
Nitrite: NEGATIVE
Protein, ur: NEGATIVE mg/dL
Specific Gravity, Urine: 1.021 (ref 1.005–1.030)
pH: 5 (ref 5.0–8.0)

## 2020-02-28 LAB — TROPONIN I (HIGH SENSITIVITY)
Troponin I (High Sensitivity): 19 ng/L — ABNORMAL HIGH (ref <18)
Troponin I (High Sensitivity): 19 ng/L — ABNORMAL HIGH (ref <18)

## 2020-02-28 MED ORDER — SODIUM CHLORIDE 0.9% FLUSH: 3.0000 mL | Freq: Once | INTRAVENOUS | Status: DC

## 2020-02-28 NOTE — ED Triage Notes (Signed)
Pt here from nursing home where he had a episode today of being less responsive and brady , pt is back at baseline now , family wanted pt transported  to hospital  For eval

## 2020-02-28 NOTE — ED Provider Notes (Signed)
[facility] EMERGENCY DEPARTMENT Provider Note   CSN: [id number] Arrival date & time: 02/28/20  1844     History CC: confusion  Anthony Hayes is a 85 y.o. male history of bradycardia presenting from his nursing facility with concern for bradycardia and less responsiveness.  The patient is a DNR.  He has a legal guardian who is his daughter at the bedside.  He is advanced mention cannot provide any history.  She tells me the facility was concerned because the patient's heart rate got "low," however she says he is always sinus brady with rate in the 40's.  They believe the patient is at his baseline.  She thinks is possible he is UTI, as he has had this in the past, and sometimes has episodes of unresponsiveness with UTI.  Patient himself has no acute complaints.  He denies any headache.  He denies any chest pain or shortness of breath.  HPI     Past Medical History:  Diagnosis Date  . Arthritis    "joints" (10/15/2017)  . Benign prostatic hyperplasia   . Complication of anesthesia    "last OR in 06/2017 affected him cognitively; hasn't got back to baseline yet" (10/15/2017)  . Dementia ([place])   . Depression   . GERD (gastroesophageal reflux disease)   . High cholesterol   . Hypertension   . Hypothyroidism   . Renal disease   . Renal disorder   . TIA (transient ischemic attack) early 2000s    Patient Active Problem List   Diagnosis Date Noted  . Altered mental status 01/15/2020  . Pneumonia due to COVID-19 virus 01/14/2020  . COVID-19 virus infection 12/19/2019  . Protein-calorie malnutrition, severe ([place]) 12/19/2019  . HCAP (healthcare-associated pneumonia) 11/22/2018  . Fall 11/22/2018  . Chronic diastolic CHF (congestive heart failure) ([place]) 11/22/2018  . Hypoxia   . Bradycardia 10/28/2018  . Leg swelling 10/28/2018  . Irregular heart beat 09/08/2018  . Sleep disturbance   . Stage 3 chronic kidney disease   . Benign essential HTN   . Hypokalemia   .  Hyponatremia   . Infarction of right basal ganglia ([place]) 04/06/2018  . Pressure injury of skin 04/06/2018  . Vitamin B12 deficiency   . Hypothyroid   . Crohn's disease with complication ([place])   . Benign prostatic hyperplasia   . Acute lower UTI   . Dementia without behavioral disturbance ([place])   . Acute ischemic stroke ([place]) 04/05/2018  . Leukocytosis   . Shortness of breath   . Acute encephalopathy   . CVA (cerebral vascular accident) ([place]) 04/03/2018  . Calculus, kidney 01/21/2018  . Acute blood loss anemia   . Mallory-Weiss syndrome   . Esophageal stricture   . Respiratory failure ([place])   . UGIB (upper gastrointestinal bleed) 10/16/2017  . GERD (gastroesophageal reflux disease) 10/16/2017  . Essential hypertension, benign 10/16/2017  . Chest pain 10/15/2017  . Hypothyroidism (acquired) 06/08/2017  . Chronic kidney disease, stage III (moderate) 03/19/2016  . Dyslipidemia 03/19/2016  . Degeneration of lumbar or lumbosacral intervertebral disc 07/10/2008  . Arthropathy, lower leg 05/23/2004    Past Surgical History:  Procedure Laterality Date  . ABDOMINAL EXPLORATION SURGERY  7/  . APPENDECTOMY    . CATARACT EXTRACTION W/ INTRAOCULAR LENS  IMPLANT, BILATERAL Bilateral   . COLECTOMY  1950s   "thought he had iteilis"  . ESOPHAGOGASTRODUODENOSCOPY N/A 10/22/2017   Procedure: ESOPHAGOGASTRODUODENOSCOPY (EGD);  Surgeon: Gatha Mayer, MD;  Location: [facility] ENDOSCOPY;  Service: Endoscopy;  Laterality: N/A;  . ESOPHAGOGASTRODUODENOSCOPY (EGD) WITH PROPOFOL N/A 10/16/2017   Procedure: ESOPHAGOGASTRODUODENOSCOPY (EGD) WITH PROPOFOL;  Surgeon: Milus Banister, MD;  Location: [facility] ENDOSCOPY;  Service: Endoscopy;  Laterality: N/A;  . FEMUR FRACTURE SURGERY Right 1950s  . FRACTURE SURGERY    . KNEE ARTHROSCOPY Left   . POSTERIOR LUMBAR FUSION    . ROTATOR CUFF REPAIR Left   . TONSILLECTOMY         Family History  Problem Relation Age of Onset  . Hypertension Father     Social  History   Tobacco Use  . Smoking status: Former Smoker    Years: 7.00    Types: Cigarettes    Quit date: 1963    Years since quitting: 58.2  . Smokeless tobacco: Never Used  Substance Use Topics  . Alcohol use: Yes    Comment: 10/15/2017 "a few drinks/year"  . Drug use: No    Home Medications Prior to Admission medications   Medication Sig Start Date End Date Taking? Authorizing Provider  amLODipine (NORVASC) 5 MG tablet Take 1 tablet (5 mg total) by mouth daily. 11/26/18  Yes Caren Griffins, MD  ascorbic acid (VITAMIN C) 500 MG tablet Take 1 tablet (500 mg total) by mouth daily. 01/19/20  Yes Lucky Cowboy, MD  Cholecalciferol (VITAMIN D3) 50 MCG (2000 UT) capsule Take 2,000 Units by mouth daily.    Yes [provider]  clopidogrel (PLAVIX) 75 MG tablet Take 1 tablet (75 mg total) by mouth daily. 11/29/18  Yes Minus Breeding, MD  cyanocobalamin (,VITAMIN B-12,) 1000 MCG/ML injection Inject 1,000 mcg into the muscle every 30 (thirty) days.   Yes [provider]  levothyroxine (SYNTHROID) 50 MCG tablet Take 50 mcg by mouth daily before breakfast.   Yes [provider]  Melatonin 3 MG SUBL Place 6 mg under the tongue at bedtime.   Yes [provider]  pantoprazole (PROTONIX) 40 MG tablet Take 1 tablet (40 mg total) by mouth daily. 04/05/18  Yes Dhungel, Nishant, MD  pravastatin (PRAVACHOL) 10 MG tablet Take 1 tablet (10 mg total) by mouth daily. Patient taking differently: Take 10 mg by mouth at bedtime.  06/06/18  Yes McCue, Janett Billow, NP  sertraline (ZOLOFT) 100 MG tablet Take 100 mg by mouth daily. Take with Sertraline 25 mg to equal 125 mg 08/17/18  Yes [provider]  sertraline (ZOLOFT) 25 MG tablet Take 25 mg by mouth daily. Take with Sertraline 100 mg to equal 125 mg   Yes [provider]  zinc sulfate 220 (50 Zn) MG capsule Take 1 capsule (220 mg total) by mouth daily. 01/19/20  Yes Lucky Cowboy, MD    Allergies      Lisinopril  Review of Systems   Review of Systems  Unable to perform ROS: Dementia (level 5 caveat)    Physical Exam Updated Vital Signs BP (!) 153/75   Pulse (!) 44   Temp 98.5 F (36.9 C) (Oral)   Resp 16   SpO2 99%   Physical Exam Vitals and nursing note reviewed.  Constitutional:      Appearance: He is well-developed.  HENT:     Head: Normocephalic and atraumatic.  Eyes:     Conjunctiva/sclera: Conjunctivae normal.  Cardiovascular:     Rate and Rhythm: Regular rhythm. Bradycardia present.     Pulses: Normal pulses.  Pulmonary:     Effort: Pulmonary effort is normal. No respiratory distress.     Breath sounds: Normal breath  sounds.  Abdominal:     Palpations: Abdomen is soft.     Tenderness: There is no abdominal tenderness.  Musculoskeletal:     Cervical back: Neck supple.  Skin:    General: Skin is warm and dry.  Neurological:     General: No focal deficit present.     Mental Status: He is alert. Mental status is at baseline.     Cranial Nerves: No cranial nerve deficit.     Sensory: No sensory deficit.     ED Results / Procedures / Treatments   Labs (all labs ordered are listed, but only abnormal results are displayed) Labs Reviewed  BASIC METABOLIC PANEL - Abnormal; Notable for the following components:      Result Value   Potassium 3.2 (*)    All other components within normal limits  CBC - Abnormal; Notable for the following components:   Hemoglobin 12.0 (*)    HCT 36.7 (*)    All other components within normal limits  TROPONIN I (HIGH SENSITIVITY) - Abnormal; Notable for the following components:   Troponin I (High Sensitivity) 19 (*)    All other components within normal limits  TROPONIN I (HIGH SENSITIVITY) - Abnormal; Notable for the following components:   Troponin I (High Sensitivity) 19 (*)    All other components within normal limits  URINALYSIS, ROUTINE W REFLEX MICROSCOPIC    EKG  Radiology DG Chest 2 View  Result Date:  02/28/2020 CLINICAL DATA:  Weakness EXAM: CHEST - 2 VIEW COMPARISON:  CT a chest 01/14/2020 FINDINGS: A stable mediastinum and cardiac silhouette. Prominence of the RIGHT hilum is similar to comparison exams. Mild interstitial pattern in lingula. Lungs are hyperinflated. No focal consolidation. IMPRESSION: Mild interstitial pattern in the lingula could represent interstitial edema or early pneumonia. Electronically Signed   By: Suzy Bouchard M.D.   On: 02/28/2020 19:28    Procedures Procedures (including critical care time)  Medications Ordered in ED Medications  sodium chloride flush (NS) 0.9 % injection 3 mL (has no administration in time range)    ED Course  I have reviewed the triage vital signs and the nursing notes.  Pertinent labs & imaging results that were available during my care of the patient were reviewed by me and considered in my medical decision making (see chart for details).  85 yo male here with transient episode of reported bradycardia (unclear how low HR was) and possible confusion, or minimal responsiveness, while at [facility].  No deficits noted by EMS.  No neuro deficits on arrival.  Patient appears comfortable. His daughter at bedside tells me this is his baseline mental status.  He has chronic bradycardia, with HR in the 40's.  Labs here with no noteable ecg findings.  Specifically, he has a bifascicular block (unchanged from prior) with no ST elevations, no ischemia changes, and no evidence of heart block, per my interpretation.  He has no fever or leukocytosis to suggest infection.  UA negative.  DG chest with questionable lingula infiltrate, but in the abscence of fever, cough, leukocytosis, or abnormal pulmonary exam, I doubt this is PNA.  Likewise doubt COVID.  Gluose wnl.   Mild hypok+, unlikely to cause deficits  No evidence of stroke.  Don't think he needs CTH.  Will discharge back to [facility].  I gave report to their  staff.  Clinical Course as of Feb 27 2357  Wed Feb 28, 2020  2343 UA negative, will discharge   [MT]  Clinical Course User Index [MT] Hanaa Payes, Carola Rhine, MD    Final Clinical Impression(s) / ED Diagnoses Final diagnoses:  Bradycardia  Confusion    Rx / DC Orders ED Discharge Orders    None       Langston Masker Carola Rhine, MD 02/28/20 2358

## 2020-02-28 NOTE — Discharge Instructions (Addendum)
Labs are included.  UA did not indicate a urinary infection.  Xray showed a possible infiltrate in the right lung, but he had no other signs of pneumonia (no fever, no hypoxia, no elevated white blood cell count).  I did not feel he had a bacterial pneumonia at this time.  Patient was stable here, ECG without acute findings.  He has chronic sinus bradycardia.  He can follow up with a cardiologist if there are any further concerns about this episode today.

## 2020-02-29 ENCOUNTER — Encounter: Payer: Self-pay | Admitting: Internal Medicine

## 2020-02-29 ENCOUNTER — Non-Acute Institutional Stay: Payer: Medicare Other | Admitting: Internal Medicine

## 2020-02-29 ENCOUNTER — Other Ambulatory Visit: Payer: Self-pay

## 2020-02-29 VITALS — BP 142/70 | HR 48 | Ht 66.0 in | Wt 164.0 lb

## 2020-02-29 DIAGNOSIS — Z7189 Other specified counseling: Secondary | ICD-10-CM

## 2020-02-29 DIAGNOSIS — Z515 Encounter for palliative care: Secondary | ICD-10-CM

## 2020-02-29 NOTE — ED Notes (Signed)
   Patients daughter requested to take him home POV to [facility].  Spoke with Dr. Langston Masker and he said that was ok.  Called [facility] to notify them that patient was heading back to facility and confirmed someone would be there to help get him back inside.

## 2020-02-29 NOTE — Progress Notes (Signed)
March 4th, 2021 [facility] Telephone: [phone] Fax: [phone]   PATIENT NAME: Anthony Hayes DOB: 04-26-1935 MRN: [id number] Anthony Hayes 1, [place] [address]   PRIMARY CARE PROVIDER:   Farrel Conners NP ([facility])    [facility], [facility]. [address] [place], [place] [address] Dr. Jewel Hayes [phone] Dr. Minus Hayes (Cardiology)   REFERRING PROVIDER:Thu Alfonse Spruce NP   RESPONSIBLE PARTY:  *(dtr) Anthony Hayes [phone], (M) [phone]. Anthony Hayes [id number] [phone], [phone] (Rob). (S-I-L) Anthony Hayes [phone]   IMPRESSION/RECOMMENDATIONS: 1.Advance Care Planning:             A. Directives: (present in facility chart and [facility] EMR) DNR. MOST form details: DNR/DNI, Limited scope of medical interventions, Yes to Antibiotics and IVFs, feeding tube for a defined trial period.                B. Goals of Care: Daughters hoping for COVID restrictions to be lifted soon, so that they can visit their dad face to face. Spoke to facility Surveyor, quantity; hope is to liberalize visiting restrictions end of next week so that family will be able to visit within apartment. I plan to explore with daughters if they wish their dad to continue to be sent to the ER as he decompensates, or if they believe patients wishes would be to defer these visits. Also, would they wish for a cardiology consult to ascertain if bradycardia is contributing to patients symptoms of lethargy/somnolence. Could also just be a symptom of expected continued cognitive decline of dementia. Start education / discussion regarding hospice services as an alternative choice.    2. Cognitive Functional Status: FAST 6e.  Patient is s/p 3 hospitalizations and 1 ER eval within the last 3 months. First hospitalization with sepsis from pneumonia, then 2 separate admissions for COVID pneumonia. Most recent ER visit  yesterday d/t HR 38, Sat 88%, increased lethargy and somnolence such that not engaging despite tactile and verbal stimulation. In ER HR was 44. Neuro exam back to baseline. UA checked and was negative. Troponin 1 elevated at 19 but no CP or significant EKG changes. Sent back to facility. Staff note increased weakness and somnolence. He is no longer ambulatory; transfers with sit to stand device. He is dependent for all ADLs. He consumes almost 100% of a pureed diet. Can initially feed himself  of his breakfast albeit slowly, but thereafter needs encouragement and full assist by staff for rest of his meals. Patient is oriented to self and able to follow commands. Speaks coherently in short simple sentences. He did not remember his ER visit yesterday or remember seeing his daughter who spent 7 hours with him at that time. He knew he wasnt home and seemed to recognize the name of facility when I supplied. Staff report patient is continent when toileted on schedule. Note he seems too weak to raise drinking cup to his mouth. They will trial use of a straw. Patient states good appetite and enjoys his meals. He is no longer showing signs of coughing and choking with change of diet to pureed, with regular liquids (changed from mechanical soft after recent facility speech therapy eval). He is okay with new food texture. Current weight is 164 lbs (Feb), which is stable over the prior few months.  At 5'6" his BMI is about 26.1 kg/m2.    3. Follow up: I plan to  call daughter Anthony Hayes with updates of todays visit. Will follow along every 1-2 months or so, or prn.   I spent 60 minutes providing this consultation from 10am - 11am.  More than 50% of the time in this consultation was spent coordinating communication, interviewing of staff and family, and reconciling SN MAR with EPIC med list.      HISTORY OF PRESENT ILLNESS:  Anthony Hayes is a 85 y.o. male with medical h/o hyperlipidemia, HTN, stroke (April 2019, Plavix), GERD,  hypothyroidism, depression, dementia, arthritis, BPH, Bradycardia (Dr. [facility]) and CKD 3 (creatinine 0.95).  admission  -11/25-11/29/19 Hospitalization for sepsis secondary to pneumonia. He was discharged to [facility] for rehab, then readmitted to [facility] 12/16/2018.  -12/22-12/28/20 Hospitalization for COVID pneumonia/UTI. -1/17-1/21/2021 Hospitalization for COVID pneumonia -02/28/2020 ER visit for bradycardia (HR 44) and decreased responsiveness from baseline. Neg UA. Elevated Troponin 1 (19) but no EKG changes, no CP This is a follow up Palliative Care visit (from 02/20/2020)    CODE STATUS: DNR.    PPS:  30%   HOSPICE ELIGIBILITY/DIAGNOSIS: TBD  PAST MEDICAL HISTORY:  Past Medical History:  Diagnosis Date   Arthritis    "joints" (10/15/2017)   Benign prostatic hyperplasia    Complication of anesthesia    "last OR in 06/2017 affected him cognitively; hasn't got back to baseline yet" (10/15/2017)   Dementia ([facility])    Depression    GERD (gastroesophageal reflux disease)    High cholesterol    Hypertension    Hypothyroidism    Renal disease    Renal disorder    TIA (transient ischemic attack) early 2000s    SOCIAL HX:  Social History   Tobacco Use   Smoking status: Former Smoker    Years: 7.00    Types: Cigarettes    Quit date: 1963    Years since quitting: 58.2   Smokeless tobacco: Never Used  Substance Use Topics   Alcohol use: Yes    Comment: 10/15/2017 "a few drinks/year"    ALLERGIES:  Allergies  Allergen Reactions   Lisinopril Swelling    Swelling of lips      PERTINENT MEDICATIONS:  Outpatient Encounter Medications as of 02/29/2020  Medication Sig   amLODipine (NORVASC) 5 MG tablet Take 1 tablet (5 mg total) by mouth daily.   ascorbic acid (VITAMIN C) 500 MG tablet Take 1 tablet (500 mg total) by mouth daily.   Cholecalciferol (VITAMIN D3) 50 MCG (2000 UT) capsule Take 2,000 Units by mouth  daily.    clopidogrel (PLAVIX) 75 MG tablet Take 1 tablet (75 mg total) by mouth daily.   cyanocobalamin (,VITAMIN B-12,) 1000 MCG/ML injection Inject 1,000 mcg into the muscle every 30 (thirty) days.   levothyroxine (SYNTHROID) 50 MCG tablet Take 50 mcg by mouth daily before breakfast.   Melatonin 3 MG SUBL Place 6 mg under the tongue at bedtime.   pantoprazole (PROTONIX) 40 MG tablet Take 1 tablet (40 mg total) by mouth daily.   pravastatin (PRAVACHOL) 10 MG tablet Take 1 tablet (10 mg total) by mouth daily. (Patient taking differently: Take 10 mg by mouth at bedtime. )   sertraline (ZOLOFT) 100 MG tablet Take 100 mg by mouth daily. Take with Sertraline 25 mg to equal 125 mg   sertraline (ZOLOFT) 25 MG tablet Take 25 mg by mouth daily. Take with Sertraline 100 mg to equal 125 mg   zinc sulfate 220 (50 Zn) MG capsule Take 1 capsule (220 mg  total) by mouth daily.   No facility-administered encounter medications on file as of 02/29/2020.    PHYSICAL EXAM:   BP 142/70, HR 48, RR 20   Frail appearing, slender elderly male who appears fatigued. He is engaging good initial eye contact He has a pleasant affect, and answers most questions though just A & O to self. He has poor short term memory. He can follow simple commands. He starts to answer a question, then voice trails off and he stars into space.  Lung fields: soft end insp crackles R posterior base.  Cardiac: RRR, brady. Soft systolic murmur Extremities: no LE edema  Abd: soft, non-distended, NABS. Skin: no rashes exposed areas. Neurological: Weakness but otherwise non-focal. Able to raise eyebrows, equilateral appearance of smile, equal hand grips and foot pushes. Julianne Handler, NP

## 2020-03-07 ENCOUNTER — Encounter: Payer: Self-pay | Admitting: Internal Medicine

## 2020-03-07 ENCOUNTER — Non-Acute Institutional Stay: Payer: Federal, State, Local not specified - PPO | Admitting: Internal Medicine

## 2020-03-07 NOTE — Progress Notes (Signed)
March 11th, 2021 [facility] Telephone: [phone] Fax: [phone]   PATIENT NAME: Anthony Hayes DOB: 07/01/1935 MRN: [id number] [place] [address], [place] [address]   PRIMARY CARE PROVIDER:   Farrel Conners NP ([facility])    [facility], [facility]. MAIN [place], [place] [address] Dr. Jewel Baize [phone] Dr. Minus Breeding (Cardiology)   REFERRING PROVIDER:Thu Alfonse Spruce NP   RESPONSIBLE PARTY:  *(dtr) [name] [phone], (M) [phone]. Whitney [id number] [phone], [phone] (Rob). (S-I-L) Dione Booze [phone]    IMPRESSION/RECOMMENDATIONS: 1.Advance Care Planning:             A. Directives: (present in facility chart and [facility] EMR) DNR. MOST form details: DNR/DNI, Limited scope of medical interventions, Yes to Antibiotics and IVFs, feeding tube for a defined trial period.                B. Goals of Care: Daughters hoping for COVID restrictions to be lifted soon, so that they can visit their dad face to face. Spoke to facility Surveyor, quantity; hope is to liberalize visiting within the next few weeks. anticipate patient will get his second COVID shot this Sat.   2. Cognitive Functional Status: FAST 6e. Patient continues A & O to self. He maintains good eye contact and is pleasantly engaging, though speaks in very short sentences.He has continued marked fatigue such that he can barely feed himself some of his breakfast; staff need to encourage/assist/feed the rest of breakfast and his lunch and dinner. He has been sleeping greater part of the day. He is dependent for all his ADLs. He is non-ambulatory. Mostly continent with toileting schedule. This morning extremely fatigued, likely reflecting have ridden about the facility as a passenger (pedaled by member of the activities staff) yesterday. He tells me that was a lot of fun. No recent weights. His last weight in Feb  was 164 lbs. At 5'6" his BMI was 26.1kg/m2. Staff report patient consumes about 25%-100% of a pureed diet.    3. Follow up: I called his daughter [name] with updates of today's visit. Will follow along every 1-2 months or so, or prn.   I spent 60 minutes providing this consultation from 4pm - 5pm.  More than 50% of the time in this consultation was spent coordinating communication, interviewing of staff and family, and reconciling SN MAR with EPIC med list.      HISTORY OF PRESENT ILLNESS:  Anthony Hayes is a 85 y.o. male with medical h/o hyperlipidemia, HTN, stroke (April 2019, Plavix), GERD, hypothyroidism, depression, dementia, arthritis, BPH, Bradycardia (Dr. Al[facility]) and CKD 3 (creatinine 0.95).  admission  -11/25-11/29/19 Hospitalization for sepsis secondary to pneumonia. He was discharged to [facility] for rehab, then readmitted to [facility] 12/16/2018.  -12/22-12/28/20 Hospitalization for COVID pneumonia/UTI. -1/17-1/21/2021 Hospitalization for COVID pneumonia -02/28/2020 ER visit for bradycardia (HR 44) and decreased responsiveness from baseline. Neg UA. Elevated Troponin 1 (19) but no EKG changes, no CP This is a follow up Palliative Care visit (from 02/29/2020)    CODE STATUS: DNR.    PPS:  30%   HOSPICE ELIGIBILITY/DIAGNOSIS: TBD  PAST MEDICAL HISTORY:  Past Medical History:  Diagnosis Date  . Arthritis    "joints" (10/15/2017)  . Benign prostatic hyperplasia   . Complication of anesthesia    "last OR in 06/2017 affected him cognitively; hasn't got back  to baseline yet" (10/15/2017)  . Dementia ([place])   . Depression   . GERD (gastroesophageal reflux disease)   . High cholesterol   . Hypertension   . Hypothyroidism   . Renal disease   . Renal disorder   . TIA (transient ischemic attack) early 2000s    SOCIAL HX:  Social History   Tobacco Use  . Smoking status: Former Smoker    Years: 7.00    Types: Cigarettes    Quit date:  1963    Years since quitting: 58.2  . Smokeless tobacco: Never Used  Substance Use Topics  . Alcohol use: Yes    Comment: 10/15/2017 "a few drinks/year"    ALLERGIES:  Allergies  Allergen Reactions  . Lisinopril Swelling    Swelling of lips      PERTINENT MEDICATIONS:  Outpatient Encounter Medications as of 03/07/2020  Medication Sig  . amLODipine (NORVASC) 5 MG tablet Take 1 tablet (5 mg total) by mouth daily.  Marland Kitchen ascorbic acid (VITAMIN C) 500 MG tablet Take 1 tablet (500 mg total) by mouth daily.  . Cholecalciferol (VITAMIN D3) 50 MCG (2000 UT) capsule Take 2,000 Units by mouth daily.   . clopidogrel (PLAVIX) 75 MG tablet Take 1 tablet (75 mg total) by mouth daily.  . cyanocobalamin (,VITAMIN B-12,) 1000 MCG/ML injection Inject 1,000 mcg into the muscle every 30 (thirty) days.  Marland Kitchen levothyroxine (SYNTHROID) 50 MCG tablet Take 50 mcg by mouth daily before breakfast.  . Melatonin 3 MG SUBL Place 6 mg under the tongue at bedtime.  . pantoprazole (PROTONIX) 40 MG tablet Take 1 tablet (40 mg total) by mouth daily.  . pravastatin (PRAVACHOL) 10 MG tablet Take 1 tablet (10 mg total) by mouth daily. (Patient taking differently: Take 10 mg by mouth at bedtime. )  . sertraline (ZOLOFT) 100 MG tablet Take 100 mg by mouth daily. Take with Sertraline 25 mg to equal 125 mg  . sertraline (ZOLOFT) 25 MG tablet Take 25 mg by mouth daily. Take with Sertraline 100 mg to equal 125 mg  . zinc sulfate 220 (50 Zn) MG capsule Take 1 capsule (220 mg total) by mouth daily.   No facility-administered encounter medications on file as of 03/07/2020.    PHYSICAL EXAM:   RR 16, HR 48, BP 148/74  T 97.5po, Slender, frail appearing elderly male sleeping peacefully in supine in bed. He awoke easily to voice/tactile stimulation. He is engaging good initial eye contact He has a pleasant affect, and answers most questions though just A & O to self.  Lung fields: clear.  Cardiac: RRR, brady. Soft systolic  murmur Extremities: no LE edema  Abd: soft, non-distended, NABS. Skin: no rashes exposed areas. Neurological: Weakness but otherwise non-focal.   Julianne Handler, NP

## 2020-03-19 ENCOUNTER — Other Ambulatory Visit: Payer: Self-pay

## 2020-03-19 ENCOUNTER — Encounter: Payer: Medicare Other | Admitting: Internal Medicine

## 2020-03-21 ENCOUNTER — Non-Acute Institutional Stay: Payer: Medicare Other | Admitting: Internal Medicine

## 2020-03-21 ENCOUNTER — Encounter: Payer: Self-pay | Admitting: Internal Medicine

## 2020-03-21 ENCOUNTER — Other Ambulatory Visit: Payer: Self-pay

## 2020-03-21 VITALS — Ht 66.0 in | Wt 150.0 lb

## 2020-03-21 DIAGNOSIS — Z7189 Other specified counseling: Secondary | ICD-10-CM

## 2020-03-21 DIAGNOSIS — Z515 Encounter for palliative care: Secondary | ICD-10-CM

## 2020-03-21 NOTE — Progress Notes (Addendum)
March 24th, 2021 [facility] Telephone: [phone] Fax: [phone]   PATIENT NAME: Anthony Hayes DOB: April 08, 1935 MRN: [id number] [place] [address], [place] [address]   PRIMARY CARE PROVIDER:   Farrel Conners NP ([facility])    [facility], [place] MAIN [place], [place] [address] Dr. Jewel Baize [phone] Dr. Minus Breeding (Cardiology)   REFERRING PROVIDER:Thu Alfonse Spruce NP   RESPONSIBLE PARTY:  *(dtr) [name] [phone], (M) [phone]. Perla [phone], [phone] ([address]). (S-I-L) Dione Booze [phone]   IMPRESSION/RECOMMENDATIONS: 1.Brief episode unresponsiveness: Patient sitting at dining room table eating supper. Had sudden onset redden face, tensing up/overall body rigidity, and inability to respond to me. Staring straight ahead. He dropped the spoon out of his hand. Episode lasted less than a min, then resolved. No food in his mouth/no choking. Patient back to his baseline within a few minutes, but with residual feelings of malaise. Post event BP 120/80, with HR 48, RR 16.   2. Cognitive Functional Status: FAST 6e. Patient continues A & O to self. He appears more fatigued and thin. Less inclined to maintain contact. Speaking in word or three sentences. He can feed himself small amounts then gets very fatigued and staff need to feed him. He is eating just bites at his meals.He has continued marked fatigue, he has been sleeping greater part of the day. He is dependent for all his ADLs. He is non-ambulatory. He is incontinent of bowel and bladder. His weight is 150lbs, which is a loss of 14 lbs (8.5% of body weight) over the last month. At 5'6" his BMI is 22.2kg/m2. Staff report his clothing is very baggy on him.   3.Advance Care Planning:             A. Directives: (present in facility chart and [facility] EMR) DNR. MOST form details: DNR/DNI, Limited scope of  medical interventions, Yes to Antibiotics and IVFs, feeding tube for a defined trial period.                B. Goals of Care: Comfort. Hospice referral. Daughters hoping for COVID restrictions to be lifted soon, so that they can visit their dad face to face.      4. Follow up: I called his daughters [name] and [place] with updates of today's visit. In view of patient's decline and wish to avoid hospitalization, both daughters wish for patient to be evaluated for hospice services.   I spent 60 minutes providing this consultation from 4pm - 5pm.  More than 50% of the time in this consultation was spent coordinating communication, interviewing of staff and family, and reconciling SN MAR with EPIC med list.      HISTORY OF PRESENT ILLNESS:  Anthony Hayes is a 85 y.o. male with medical h/o hyperlipidemia, HTN, stroke (April 2019, Plavix), GERD, hypothyroidism, depression, dementia, arthritis, BPH, Bradycardia (Dr. Alba Cory [facility]) and CKD 3 (creatinine 0.95).  admission  -11/25-11/29/19 Hospitalization for sepsis secondary to pneumonia. He was discharged to [facility] for rehab, then readmitted to [place] [facility] 12/16/2018.  -12/22-12/28/20 Hospitalization for COVID pneumonia/UTI. -1/17-1/21/2021 Hospitalization for COVID pneumonia -02/28/2020 ER visit for bradycardia (HR 44) and decreased responsiveness from baseline. Neg UA. Elevated Troponin 1 (19) but no EKG changes, no CP This is a follow up Palliative Care visit from 03/07/2020.   CODE STATUS: DNR.    PPS:  30%  HOSPICE ELIGIBILITY/DIAGNOSIS: TBD  PAST MEDICAL HISTORY:  Past Medical History:  Diagnosis Date  . Arthritis    "joints" (10/15/2017)  . Benign prostatic hyperplasia   . Complication of anesthesia    "last OR in 06/2017 affected him cognitively; hasn't got back to baseline yet" (10/15/2017)  . Dementia ([place])   . Depression   . GERD (gastroesophageal reflux disease)   . High cholesterol   .  Hypertension   . Hypothyroidism   . Renal disease   . Renal disorder   . TIA (transient ischemic attack) early 2000s    SOCIAL HX:  Social History   Tobacco Use  . Smoking status: Former Smoker    Years: 7.00    Types: Cigarettes    Quit date: 1963    Years since quitting: 58.2  . Smokeless tobacco: Never Used  Substance Use Topics  . Alcohol use: Yes    Comment: 10/15/2017 "a few drinks/year"    ALLERGIES:  Allergies  Allergen Reactions  . Lisinopril Swelling    Swelling of lips      PERTINENT MEDICATIONS:  Outpatient Encounter Medications as of 03/21/2020  Medication Sig  . amLODipine (NORVASC) 5 MG tablet Take 1 tablet (5 mg total) by mouth daily.  Marland Kitchen ascorbic acid (VITAMIN C) 500 MG tablet Take 1 tablet (500 mg total) by mouth daily.  . Cholecalciferol (VITAMIN D3) 50 MCG (2000 UT) capsule Take 2,000 Units by mouth daily.   . clopidogrel (PLAVIX) 75 MG tablet Take 1 tablet (75 mg total) by mouth daily.  . cyanocobalamin (,VITAMIN B-12,) 1000 MCG/ML injection Inject 1,000 mcg into the muscle every 30 (thirty) days.  Marland Kitchen levothyroxine (SYNTHROID) 50 MCG tablet Take 50 mcg by mouth daily before breakfast.  . Melatonin 3 MG SUBL Place 6 mg under the tongue at bedtime.  . pantoprazole (PROTONIX) 40 MG tablet Take 1 tablet (40 mg total) by mouth daily.  . pravastatin (PRAVACHOL) 10 MG tablet Take 1 tablet (10 mg total) by mouth daily. (Patient taking differently: Take 10 mg by mouth at bedtime. )  . sertraline (ZOLOFT) 100 MG tablet Take 100 mg by mouth daily. Take with Sertraline 25 mg to equal 125 mg  . sertraline (ZOLOFT) 25 MG tablet Take 25 mg by mouth daily. Take with Sertraline 100 mg to equal 125 mg  . zinc sulfate 220 (50 Zn) MG capsule Take 1 capsule (220 mg total) by mouth daily.   No facility-administered encounter medications on file as of 03/21/2020.    PHYSICAL EXAM:   RR 16, HR 48, BP 120/80    Slender, frail appearing elderly male sitting at the dining  room table in his wheelchair. He is much more fatigued and less engaging then at previous visit. Answers appropriately in few word sentences. ..  Lung fields: clear.  Cardiac: RRR, brady. Soft systolic murmur Extremities: no LE edema  Abd: soft, non-distended, NABS. Skin: no rashes exposed areas. Neurological: Weakness but otherwise non-focal.   Julianne Handler, NP

## 2020-04-20 IMAGING — DX DG FOOT COMPLETE 3+V*L*
3 series · 3 of 3 positions shown · non-contrast
Comparison: No comparison studies available.

CLINICAL DATA: Fall today left foot swelling.

EXAM:
LEFT FOOT - COMPLETE 3+ VIEW

[x foot ap left]
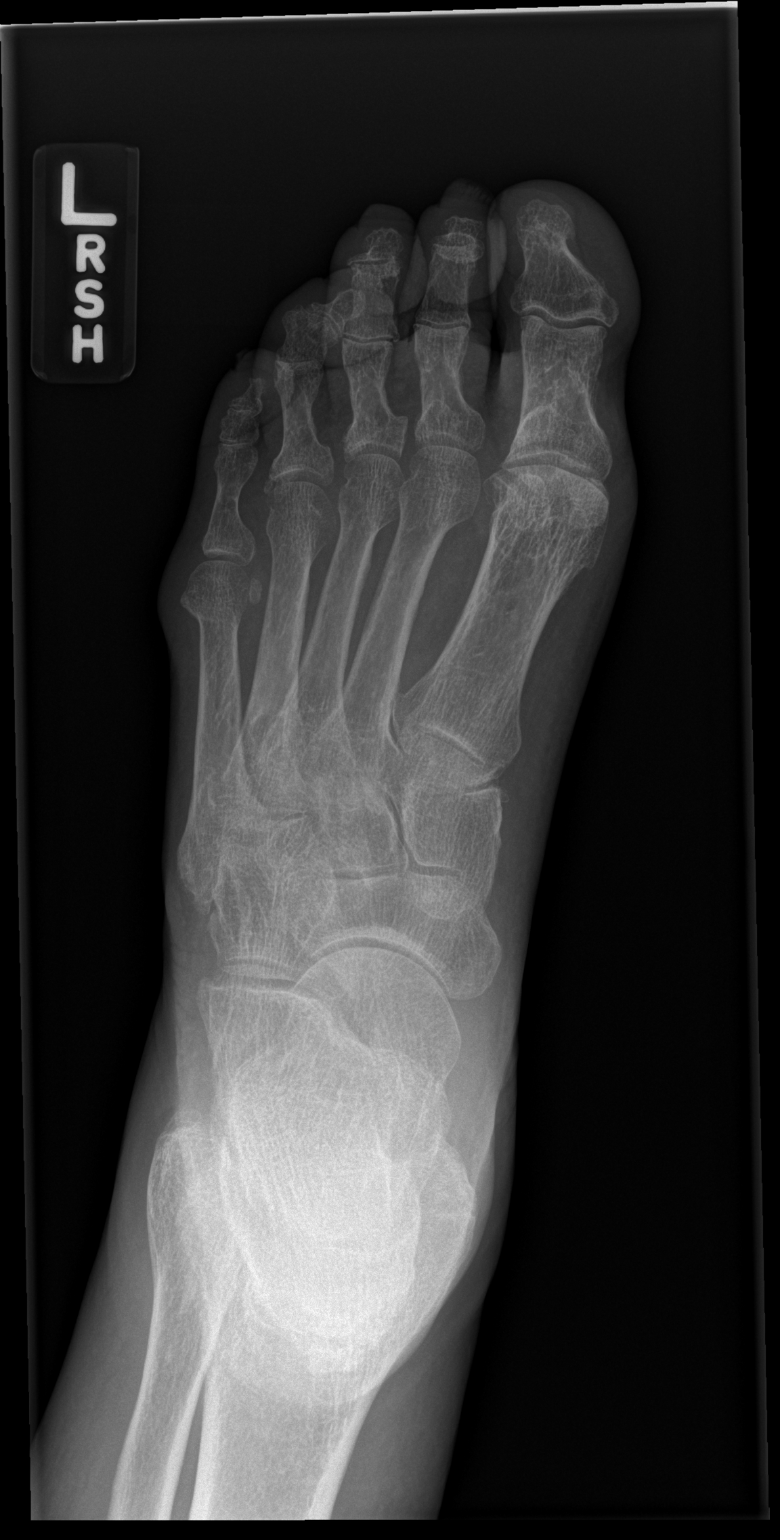

[x foot obl left]
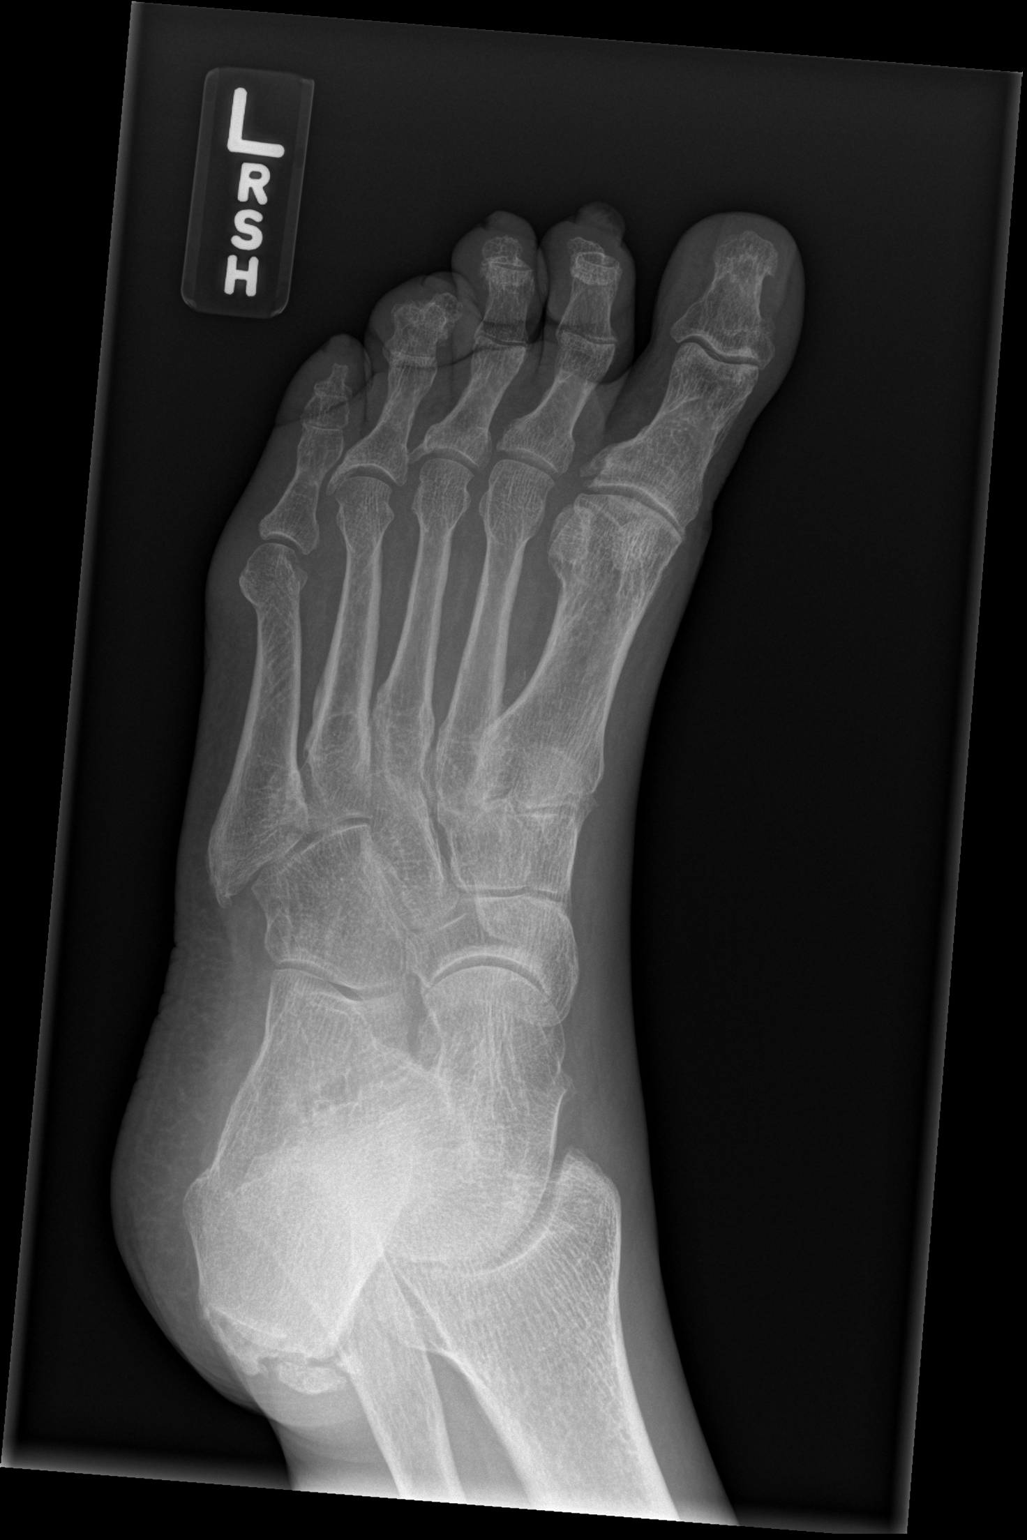

[x foot lat left]
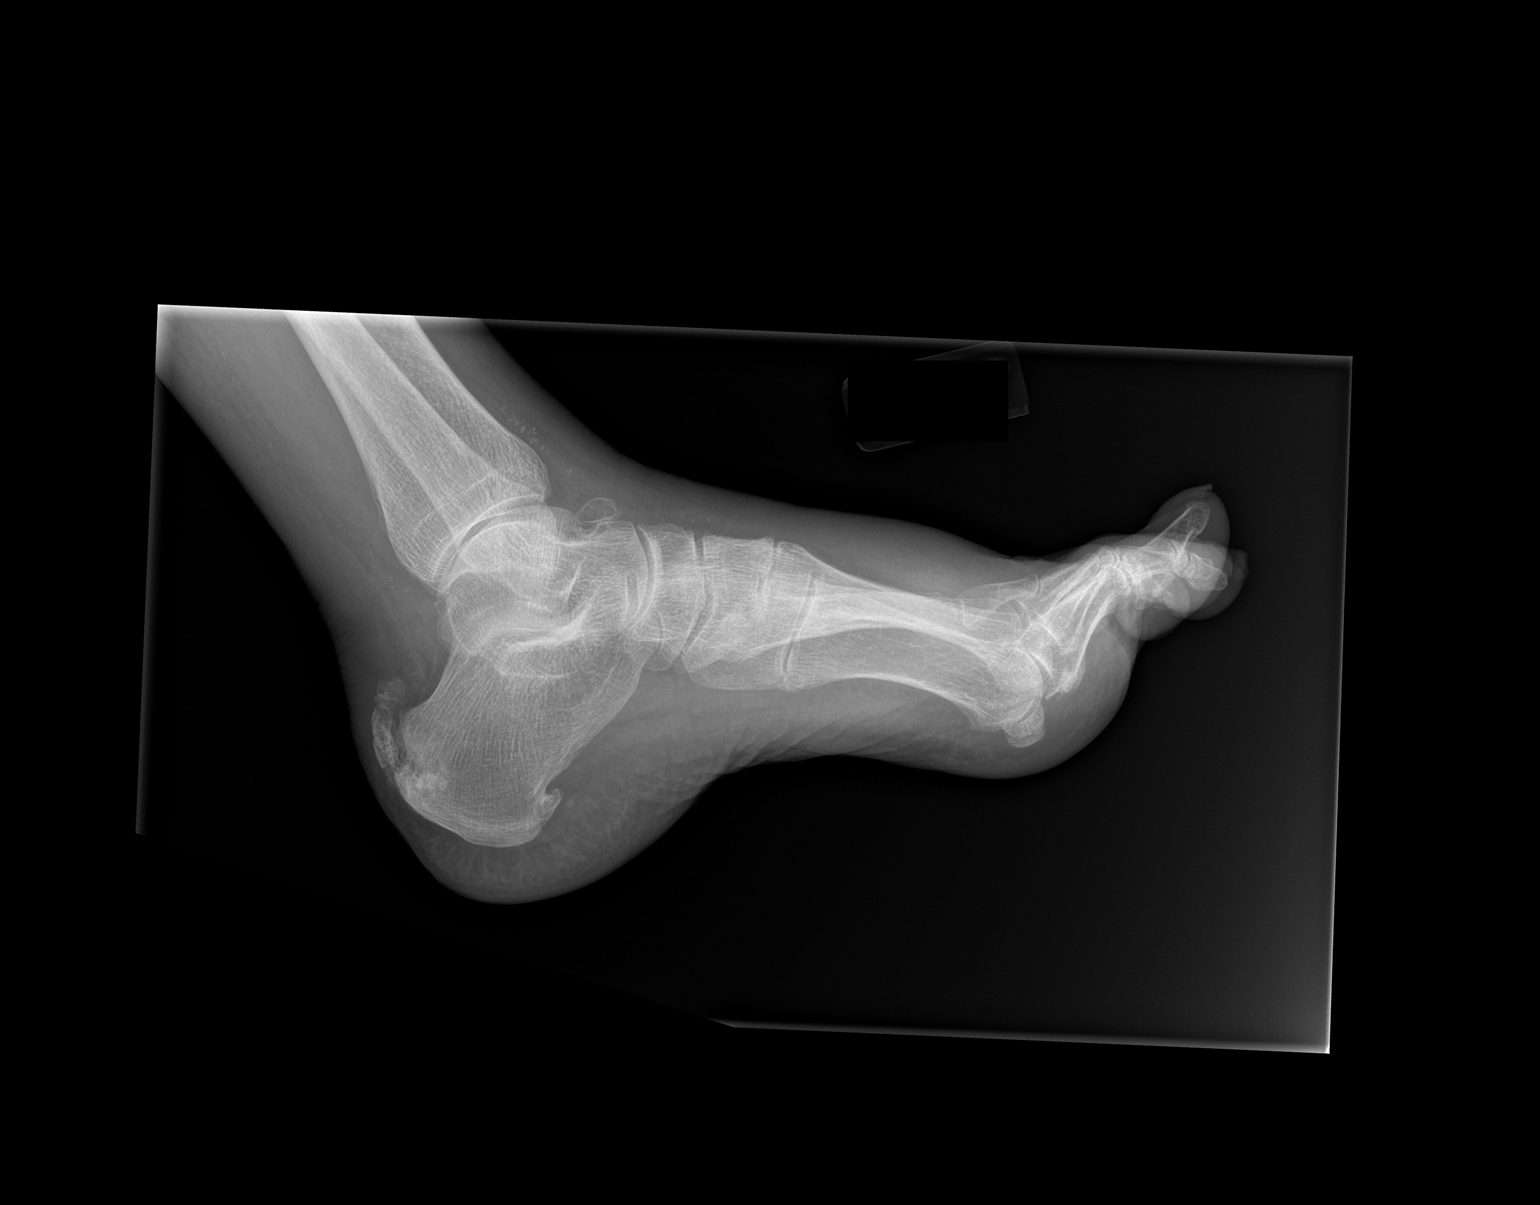

[3 of 3 positions shown; findings below may reference images not displayed]

FINDINGS: Bones are diffusely demineralized. No evidence for an acute
fracture. No subluxation or dislocation. Degenerative changes are
noted in scattered MTP joints. Mineralization at the Achilles tendon
insertion is compatible with prior trauma or repetitive injury. No
worrisome lytic or sclerotic osseous abnormality.
IMPRESSION: No acute bony abnormality.

## 2020-04-29 IMAGING — CR DG SHOULDER 2+V*L*
2 series · 2 of 2 positions shown · non-contrast
Comparison: No prior shoulder imaging.  Chest radiograph 04/05/2018

CLINICAL DATA: Left shoulder pain after fall tonight.

EXAM:
LEFT SHOULDER - 2+ VIEW

[shoulder grashey]
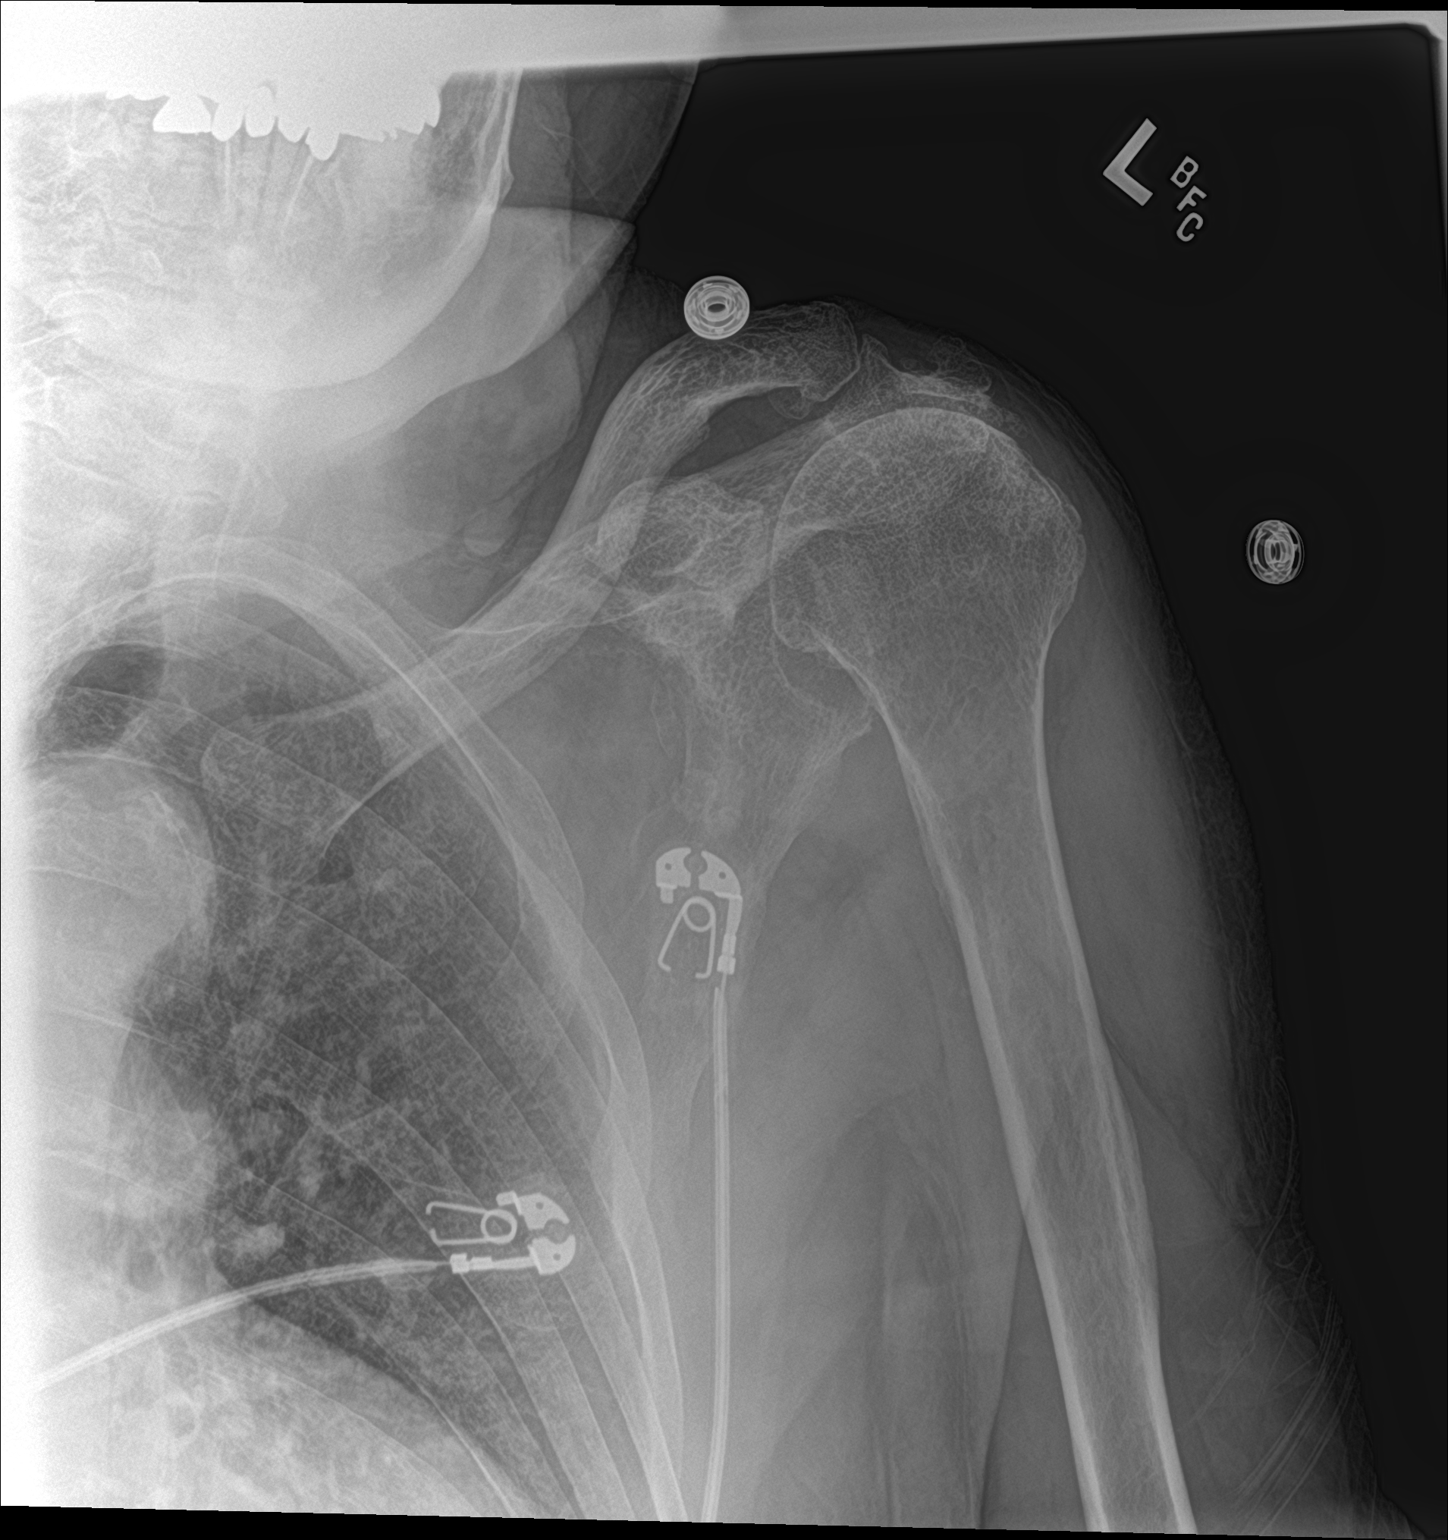

[shoulder y view]
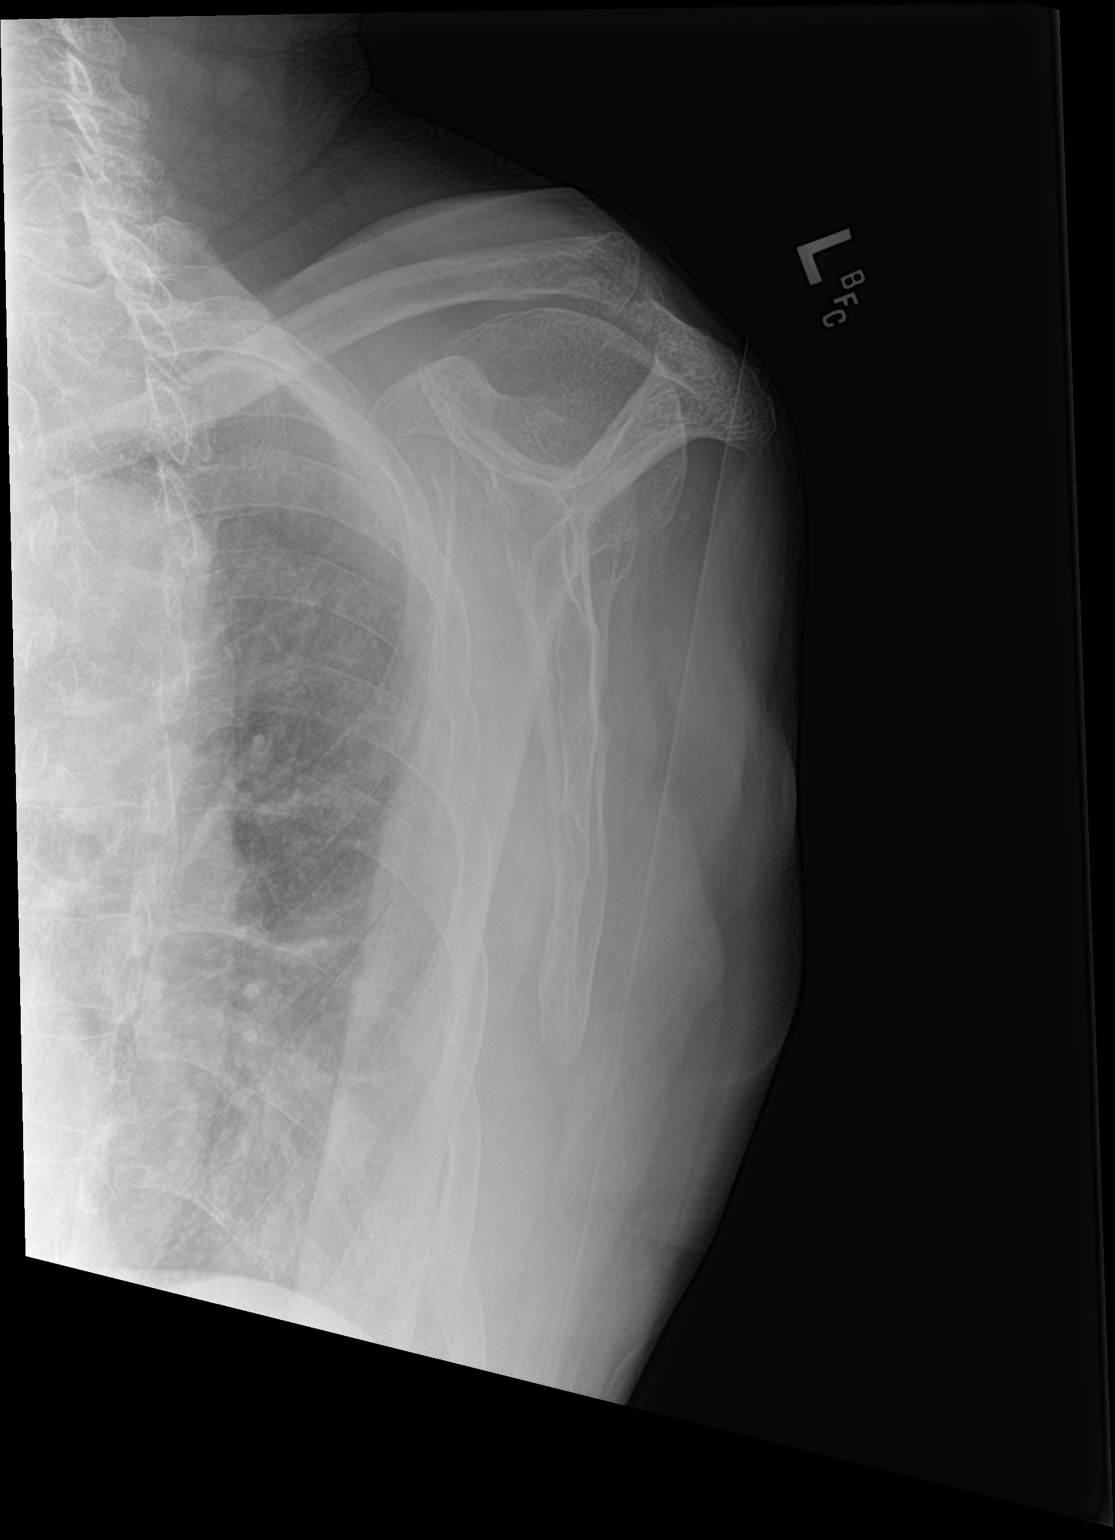

[2 of 2 positions shown; findings below may reference images not displayed]

FINDINGS: No acute fracture. Chronic superior subluxation of the humeral head
abutting the undersurface of the acromion. Inferiorly directed
osteophytes at the acromioclavicular joint. Subcortical irregularity
of the lateral humeral head. Possible intra-articular bodies,
chronic and unchanged.
IMPRESSION: 1. No acute fracture or dislocation.
2. Findings consistent with chronic rotator cuff arthropathy.
Acromioclavicular osteoarthritis with inferior osteophytes.

## 2020-11-16 ENCOUNTER — Encounter (HOSPITAL_COMMUNITY): Payer: Self-pay | Admitting: Emergency Medicine

## 2020-11-16 ENCOUNTER — Other Ambulatory Visit: Payer: Self-pay

## 2020-11-16 ENCOUNTER — Emergency Department (HOSPITAL_COMMUNITY)
Admission: EM | Admit: 2020-11-16 | Discharge: 2020-11-16 | Disposition: A | Discharge status: Home / Self Care | Attending: Emergency Medicine | Admitting: Emergency Medicine

## 2020-11-16 DIAGNOSIS — I5032 Chronic diastolic (congestive) heart failure: Secondary | ICD-10-CM | POA: Insufficient documentation

## 2020-11-16 DIAGNOSIS — I130 Hypertensive heart and chronic kidney disease with heart failure and stage 1 through stage 4 chronic kidney disease, or unspecified chronic kidney disease: Secondary | ICD-10-CM | POA: Diagnosis not present

## 2020-11-16 DIAGNOSIS — F0390 Unspecified dementia without behavioral disturbance: Secondary | ICD-10-CM | POA: Insufficient documentation

## 2020-11-16 DIAGNOSIS — W19XXXA Unspecified fall, initial encounter: Secondary | ICD-10-CM

## 2020-11-16 DIAGNOSIS — Z87891 Personal history of nicotine dependence: Secondary | ICD-10-CM | POA: Insufficient documentation

## 2020-11-16 DIAGNOSIS — E039 Hypothyroidism, unspecified: Secondary | ICD-10-CM | POA: Insufficient documentation

## 2020-11-16 DIAGNOSIS — Z79899 Other long term (current) drug therapy: Secondary | ICD-10-CM | POA: Insufficient documentation

## 2020-11-16 DIAGNOSIS — W050XXA Fall from non-moving wheelchair, initial encounter: Secondary | ICD-10-CM | POA: Insufficient documentation

## 2020-11-16 DIAGNOSIS — S0001XA Abrasion of scalp, initial encounter: Secondary | ICD-10-CM | POA: Insufficient documentation

## 2020-11-16 DIAGNOSIS — N183 Chronic kidney disease, stage 3 unspecified: Secondary | ICD-10-CM | POA: Insufficient documentation

## 2020-11-16 LAB — BASIC METABOLIC PANEL
Anion gap: 10 (ref 5–15)
BUN: 26 mg/dL — ABNORMAL HIGH (ref 8–23)
CO2: 22 mmol/L (ref 22–32)
Calcium: 9.2 mg/dL (ref 8.9–10.3)
Chloride: 107 mmol/L (ref 98–111)
Creatinine, Ser: 0.97 mg/dL (ref 0.61–1.24)
GFR, Estimated: >60 mL/min (ref >60)
Glucose, Bld: 90 mg/dL (ref 70–99)
Potassium: 3.8 mmol/L (ref 3.5–5.1)
Sodium: 139 mmol/L (ref 135–145)

## 2020-11-16 LAB — CBC
HCT: 38.2 % — ABNORMAL LOW (ref 39.0–52.0)
Hemoglobin: 12.2 g/dL — ABNORMAL LOW (ref 13.0–17.0)
MCH: 29.8 pg (ref 26.0–34.0)
MCHC: 31.9 g/dL (ref 30.0–36.0)
MCV: 93.2 fL (ref 80.0–100.0)
Platelets: 345 10*3/uL (ref 150–400)
RBC: 4.1 MIL/uL — ABNORMAL LOW (ref 4.22–5.81)
RDW: 13.3 % (ref 11.5–15.5)
WBC: 11.4 10*3/uL — ABNORMAL HIGH (ref 4.0–10.5)
nRBC: 0 % (ref 0.0–0.2)

## 2020-11-16 NOTE — Progress Notes (Signed)
Manufacturing engineer [facility]) Community Based Palliative Care       This patient is enrolled as an active hospice patient with [facility] hospice.  [facility] will continue to follow for any discharge planning needs and to coordinate continuation of hospice care after discharge.    At time of discharge please use [facility] for transport, as we contract with them for our active hospice patients.  If you have questions or need assistance, please call [phone] or contact the hospital Liaison listed on AMION.     Thank you for the opportunity to participate in this patient's care.     Domenic Moras, BSN, RN [facility] Liaison   [phone]

## 2020-11-16 NOTE — ED Triage Notes (Signed)
Pt arrives to ED via [facility] from [place] with complaints of lower back and buttocks pain. Per EMS, pt slid out of wheel chair, striking his bottom against the floor. No injury to head or neck. EMS noted to be brady by EMS, HR in higher 40's.

## 2020-11-16 NOTE — ED Provider Notes (Signed)
[facility] EMERGENCY DEPARTMENT Provider Note   CSN: [id number] Arrival date & time: 11/16/20  1211  LEVEL 5 CAVEAT - DEMENTIA  History Chief Complaint  Patient presents with  . Back Pain  . Bradycardia    Anthony Hayes is a 85 y.o. male.  HPI 85 year old male presents after a fall.  History is from the daughter at the bedside.  Patient fell out of his wheelchair which is not uncommon for him as he often tries to pick up things on the ground.  There was no report of loss of consciousness.  Originally patient was complaining of leg pain though she is not sure which one and he no longer is complaining of pain.  EMS report was of back pain.  Daughter states family wished they had called hospice rather than 911 as they would not have wanted him brought to the hospital.  He does appear to have a scalp abrasion though they reported no head injury at the facility.  He is on Plavix.   Past Medical History:  Diagnosis Date  . Arthritis    "joints" (10/15/2017)  . Benign prostatic hyperplasia   . Complication of anesthesia    "last OR in 06/2017 affected him cognitively; hasn't got back to baseline yet" (10/15/2017)  . Dementia ([place])   . Depression   . GERD (gastroesophageal reflux disease)   . High cholesterol   . Hypertension   . Hypothyroidism   . Renal disease   . Renal disorder   . TIA (transient ischemic attack) early 2000s    Patient Active Problem List   Diagnosis Date Noted  . Altered mental status 01/15/2020  . Pneumonia due to COVID-19 virus 01/14/2020  . COVID-19 virus infection 12/19/2019  . Protein-calorie malnutrition, severe ([place]) 12/19/2019  . HCAP (healthcare-associated pneumonia) 11/22/2018  . Fall 11/22/2018  . Chronic diastolic CHF (congestive heart failure) ([place]) 11/22/2018  . Hypoxia   . Bradycardia 10/28/2018  . Leg swelling 10/28/2018  . Irregular heart beat 09/08/2018  . Sleep disturbance   . Stage 3 chronic kidney disease ([place])     . Benign essential HTN   . Hypokalemia   . Hyponatremia   . Infarction of right basal ganglia ([place]) 04/06/2018  . Pressure injury of skin 04/06/2018  . Vitamin B12 deficiency   . Hypothyroid   . Crohn's disease with complication ([place])   . Benign prostatic hyperplasia   . Acute lower UTI   . Dementia without behavioral disturbance ([place])   . Acute ischemic stroke ([place]) 04/05/2018  . Leukocytosis   . Shortness of breath   . Acute encephalopathy   . CVA (cerebral vascular accident) ([place]) 04/03/2018  . Calculus, kidney 01/21/2018  . Acute blood loss anemia   . Mallory-Weiss syndrome   . Esophageal stricture   . Respiratory failure ([place])   . UGIB (upper gastrointestinal bleed) 10/16/2017  . GERD (gastroesophageal reflux disease) 10/16/2017  . Essential hypertension, benign 10/16/2017  . Chest pain 10/15/2017  . Hypothyroidism (acquired) 06/08/2017  . Chronic kidney disease, stage III (moderate) 03/19/2016  . Dyslipidemia 03/19/2016  . Degeneration of lumbar or lumbosacral intervertebral disc 07/10/2008  . Arthropathy, lower leg 05/23/2004    Past Surgical History:  Procedure Laterality Date  . ABDOMINAL EXPLORATION SURGERY  7/  . APPENDECTOMY    . CATARACT EXTRACTION W/ INTRAOCULAR LENS  IMPLANT, BILATERAL Bilateral   . COLECTOMY  1950s   "thought he had iteilis"  . ESOPHAGOGASTRODUODENOSCOPY N/A 10/22/2017  Procedure: ESOPHAGOGASTRODUODENOSCOPY (EGD);  Surgeon: Gatha Mayer, MD;  Location[facility] ENDOSCOPY;  Service: Endoscopy;  Laterality: N/A;  . ESOPHAGOGASTRODUODENOSCOPY (EGD) WITH PROPOFOL N/A 10/16/2017   Procedure: ESOPHAGOGASTRODUODENOSCOPY (EGD) WITH PROPOFOL;  Surgeon: Milus Banister, MD;  Location: [facility] ENDOSCOPY;  Service: Endoscopy;  Laterality: N/A;  . FEMUR FRACTURE SURGERY Right 1950s  . FRACTURE SURGERY    . KNEE ARTHROSCOPY Left   . POSTERIOR LUMBAR FUSION    . ROTATOR CUFF REPAIR Left   . TONSILLECTOMY         Family History  Problem Relation Age  of Onset  . Hypertension Father     Social History   Tobacco Use  . Smoking status: Former Smoker    Years: 7.00    Types: Cigarettes    Quit date: 1963    Years since quitting: 58.9  . Smokeless tobacco: Never Used  Vaping Use  . Vaping Use: Never used  Substance Use Topics  . Alcohol use: Yes    Comment: 10/15/2017 "a few drinks/year"  . Drug use: No    Home Medications Prior to Admission medications   Medication Sig Start Date End Date Taking? Authorizing Provider  amLODipine (NORVASC) 5 MG tablet Take 1 tablet (5 mg total) by mouth daily. 11/26/18   Caren Griffins, MD  ascorbic acid (VITAMIN C) 500 MG tablet Take 1 tablet (500 mg total) by mouth daily. 01/19/20   Lucky Cowboy, MD  Cholecalciferol (VITAMIN D3) 50 MCG (2000 UT) capsule Take 2,000 Units by mouth daily.     [provider]  clopidogrel (PLAVIX) 75 MG tablet Take 1 tablet (75 mg total) by mouth daily. 11/29/18   Minus Breeding, MD  cyanocobalamin (,VITAMIN B-12,) 1000 MCG/ML injection Inject 1,000 mcg into the muscle every 30 (thirty) days.    [provider]  levothyroxine (SYNTHROID) 50 MCG tablet Take 50 mcg by mouth daily before breakfast.    [provider]  Melatonin 3 MG SUBL Place 6 mg under the tongue at bedtime.    [provider]  pantoprazole (PROTONIX) 40 MG tablet Take 1 tablet (40 mg total) by mouth daily. 04/05/18   Dhungel, Flonnie Overman, MD  pravastatin (PRAVACHOL) 10 MG tablet Take 1 tablet (10 mg total) by mouth daily. Patient taking differently: Take 10 mg by mouth at bedtime.  06/06/18   Frann Rider, NP  sertraline (ZOLOFT) 100 MG tablet Take 100 mg by mouth daily. Take with Sertraline 25 mg to equal 125 mg 08/17/18   [provider]  sertraline (ZOLOFT) 25 MG tablet Take 25 mg by mouth daily. Take with Sertraline 100 mg to equal 125 mg    [provider]  zinc sulfate 220 (50 Zn) MG capsule Take 1 capsule (220 mg total) by mouth daily.  01/19/20   Lucky Cowboy, MD    Allergies    Lisinopril  Review of Systems   Review of Systems  Unable to perform ROS: Dementia    Physical Exam Updated Vital Signs BP (!) 157/57   Pulse (!) 43   Temp 97.6 F (36.4 C) (Oral)   Resp 15   Ht 5' 6"  (1.676 m)   Wt 68 kg   SpO2 100%   BMI 24.20 kg/m   Physical Exam Vitals and nursing note reviewed.  Constitutional:      Appearance: He is well-developed.  HENT:     Head: Normocephalic.      Right Ear: External ear normal.  Left Ear: External ear normal.     Nose: Nose normal.  Eyes:     General:        Right eye: No discharge.        Left eye: No discharge.     Extraocular Movements: Extraocular movements intact.  Cardiovascular:     Rate and Rhythm: Normal rate and regular rhythm.     Heart sounds: Normal heart sounds.  Pulmonary:     Effort: Pulmonary effort is normal.     Breath sounds: Normal breath sounds.  Abdominal:     Palpations: Abdomen is soft.     Tenderness: There is no abdominal tenderness.  Musculoskeletal:     Cervical back: Neck supple. No tenderness.     Thoracic back: No tenderness.     Lumbar back: No tenderness.     Comments: No obvious extremity trauma. Normal ROM of hips/knees. No leg tenderness  Skin:    General: Skin is warm and dry.  Neurological:     Mental Status: He is alert.     Comments: CN 3-12 grossly intact. 5/5 strength in all 4 extremities.   Psychiatric:        Mood and Affect: Mood is not anxious.     ED Results / Procedures / Treatments   Labs (all labs ordered are listed, but only abnormal results are displayed) Labs Reviewed  CBC - Abnormal; Notable for the following components:      Result Value   WBC 11.4 (*)    RBC 4.10 (*)    Hemoglobin 12.2 (*)    HCT 38.2 (*)    All other components within normal limits  BASIC METABOLIC PANEL - Abnormal; Notable for the following components:   BUN 26 (*)    All other components within normal limits    EKG EKG  Interpretation  Date/Time:  Saturday November 16 2020 12:17:39 EST Ventricular Rate:  46 PR Interval:  208 QRS Duration: 144 QT Interval:  606 QTC Calculation: 530 R Axis:   -76 Text Interpretation: Sinus bradycardia with Premature atrial complexes Right bundle branch block Left anterior fascicular block Bifascicular block Minimal voltage criteria for LVH, may be normal variant ( R in aVL ) T wave abnormality, consider inferolateral ischemia Abnormal ECG similar to Mar 2021 Confirmed by Sherwood Gambler [id number]) on 11/16/2020 1:08:24 PM   Radiology No results found.  Procedures Procedures (including critical care time)  Medications Ordered in ED Medications - No data to display  ED Course  I have reviewed the triage vital signs and the nursing notes.  Pertinent labs & imaging results that were available during my care of the patient were reviewed by me and considered in my medical decision making (see chart for details).    MDM Rules/Calculators/A&P                          I discussed options with the daughter at the bedside and she also asked the patient. Given he is in hospice, they prefer no treatment or work-up. Labs were already obtained as well as an ECG from triage. Mild leukocytosis is likely reactive. ECG shows recurrent bradycardia but his heart rate is typically in the 40s. I discussed that because he is on Plavix typical protocol would be to get a head CT but daughter here and daughter on the phone states that the result would not matter. They would pursue no intervention. He is also not having any obvious  back or lower extremity pain/injury and given this they do not want x-rays. Prefer to take him back to his facility. I think this is reasonable. Final Clinical Impression(s) / ED Diagnoses Final diagnoses:  Fall, initial encounter  Abrasion of scalp, initial encounter    Rx / DC Orders ED Discharge Orders    None       Sherwood Gambler, MD 11/16/20 1353

## 2021-08-29 ENCOUNTER — Emergency Department (HOSPITAL_COMMUNITY)

## 2021-08-29 ENCOUNTER — Emergency Department (HOSPITAL_COMMUNITY)
Admission: EM | Admit: 2021-08-29 | Discharge: 2021-08-30 | Disposition: A | Discharge status: Skilled Nursing Facility | Source: Home / Self Care | Attending: Emergency Medicine | Admitting: Emergency Medicine

## 2021-08-29 ENCOUNTER — Other Ambulatory Visit: Payer: Self-pay

## 2021-08-29 DIAGNOSIS — T1490XA Injury, unspecified, initial encounter: Secondary | ICD-10-CM

## 2021-08-29 DIAGNOSIS — Z7902 Long term (current) use of antithrombotics/antiplatelets: Secondary | ICD-10-CM | POA: Insufficient documentation

## 2021-08-29 DIAGNOSIS — W050XXA Fall from non-moving wheelchair, initial encounter: Secondary | ICD-10-CM | POA: Insufficient documentation

## 2021-08-29 DIAGNOSIS — S0101XA Laceration without foreign body of scalp, initial encounter: Secondary | ICD-10-CM

## 2021-08-29 DIAGNOSIS — K922 Gastrointestinal hemorrhage, unspecified: Secondary | ICD-10-CM | POA: Diagnosis not present

## 2021-08-29 DIAGNOSIS — S0181XA Laceration without foreign body of other part of head, initial encounter: Secondary | ICD-10-CM | POA: Insufficient documentation

## 2021-08-29 DIAGNOSIS — S0990XA Unspecified injury of head, initial encounter: Secondary | ICD-10-CM

## 2021-08-29 DIAGNOSIS — F0390 Unspecified dementia without behavioral disturbance: Secondary | ICD-10-CM | POA: Insufficient documentation

## 2021-08-29 DIAGNOSIS — Z79899 Other long term (current) drug therapy: Secondary | ICD-10-CM | POA: Insufficient documentation

## 2021-08-29 DIAGNOSIS — A4152 Sepsis due to Pseudomonas: Secondary | ICD-10-CM | POA: Diagnosis not present

## 2021-08-29 DIAGNOSIS — L89150 Pressure ulcer of sacral region, unstageable: Secondary | ICD-10-CM | POA: Insufficient documentation

## 2021-08-29 DIAGNOSIS — W19XXXA Unspecified fall, initial encounter: Secondary | ICD-10-CM

## 2021-08-29 LAB — CBC WITH DIFFERENTIAL/PLATELET
Abs Immature Granulocytes: 0.04 10*3/uL (ref 0.00–0.07)
Basophils Absolute: 0 10*3/uL (ref 0.0–0.1)
Basophils Relative: 0 %
Eosinophils Absolute: 0 10*3/uL (ref 0.0–0.5)
Eosinophils Relative: 0 %
HCT: 41 % (ref 39.0–52.0)
Hemoglobin: 13.9 g/dL (ref 13.0–17.0)
Immature Granulocytes: 0 %
Lymphocytes Relative: 20 %
Lymphs Abs: 1.9 10*3/uL (ref 0.7–4.0)
MCH: 30.8 pg (ref 26.0–34.0)
MCHC: 33.9 g/dL (ref 30.0–36.0)
MCV: 90.9 fL (ref 80.0–100.0)
Monocytes Absolute: 0.7 10*3/uL (ref 0.1–1.0)
Monocytes Relative: 8 %
Neutro Abs: 6.6 10*3/uL (ref 1.7–7.7)
Neutrophils Relative %: 72 %
Platelets: 298 10*3/uL (ref 150–400)
RBC: 4.51 MIL/uL (ref 4.22–5.81)
RDW: 13.6 % (ref 11.5–15.5)
WBC: 9.3 10*3/uL (ref 4.0–10.5)
nRBC: 0 % (ref 0.0–0.2)

## 2021-08-29 LAB — COMPREHENSIVE METABOLIC PANEL
ALT: 16 U/L (ref 0–44)
AST: 27 U/L (ref 15–41)
Albumin: 3.8 g/dL (ref 3.5–5.0)
Alkaline Phosphatase: 120 U/L (ref 38–126)
Anion gap: 9 (ref 5–15)
BUN: 31 mg/dL — ABNORMAL HIGH (ref 8–23)
CO2: 24 mmol/L (ref 22–32)
Calcium: 9.7 mg/dL (ref 8.9–10.3)
Chloride: 105 mmol/L (ref 98–111)
Creatinine, Ser: 1.12 mg/dL (ref 0.61–1.24)
GFR, Estimated: >60 mL/min (ref >60)
Glucose, Bld: 95 mg/dL (ref 70–99)
Potassium: 4 mmol/L (ref 3.5–5.1)
Sodium: 138 mmol/L (ref 135–145)
Total Bilirubin: 0.9 mg/dL (ref 0.3–1.2)
Total Protein: 7.6 g/dL (ref 6.5–8.1)

## 2021-08-29 LAB — CK: Total CK: 69 U/L (ref 49–397)

## 2021-08-29 LAB — PROTIME-INR
INR: 1.1 (ref 0.8–1.2)
Prothrombin Time: 13.9 seconds (ref 11.4–15.2)

## 2021-08-29 MED ORDER — ACETAMINOPHEN 325 MG PO TABS
650.0000 mg | ORAL_TABLET | Freq: Once | ORAL | Status: AC
  Administered 2021-08-29: 650 mg via ORAL
  Filled 2021-08-29: qty 2

## 2021-08-29 MED ORDER — OXYCODONE-ACETAMINOPHEN 5-325 MG PO TABS
1.0000 | ORAL_TABLET | Freq: Once | ORAL | Status: AC
  Administered 2021-08-29: 1 via ORAL
  Filled 2021-08-29: qty 1

## 2021-08-29 MED ORDER — HYDROCODONE-ACETAMINOPHEN 5-325 MG PO TABS: 1.0000 | ORAL_TABLET | Freq: Once | ORAL | Status: DC

## 2021-08-29 MED ORDER — BACITRACIN ZINC 500 UNIT/GM EX OINT
TOPICAL_OINTMENT | Freq: Two times a day (BID) | CUTANEOUS | Status: DC
  Administered 2021-08-29: 1 via TOPICAL
  Filled 2021-08-29: qty 0.9

## 2021-08-29 MED ORDER — LIDOCAINE-EPINEPHRINE (PF) 2 %-1:200000 IJ SOLN
10.0000 mL | Freq: Once | INTRAMUSCULAR | Status: AC
  Administered 2021-08-29: 10 mL
  Filled 2021-08-29: qty 20

## 2021-08-29 NOTE — ED Notes (Signed)
Patient transported to CT 

## 2021-08-29 NOTE — Progress Notes (Signed)
Orthopedic Tech Progress Note Patient Details:  Roberto Romanoski May 01, 1935 [id number] Level 2 Trauma Patient ID: [name], male   DOB: 1935-01-27, 86 y.o.   MRN: [id number]  Chip Boer 08/29/2021, 7:08 PM

## 2021-08-29 NOTE — Progress Notes (Signed)
   08/29/21 1900  Clinical Encounter Type  Visited With Patient  Visit Type Initial  Referral From Nurse  Consult/Referral To Chaplain  Spiritual Encounters  Spiritual Needs Prayer  Stress Factors  Patient Stress Factors Not reviewed  Family Stress Factors Not reviewed  On Call Chaplain met with patient at trauma room - patient was altered mental status and could not communicate  clearly - no family or friends present.  Will remain available through evening for any additional spiritual needs.    Respectfully Submitted - Rev. Mammie Lorenzo

## 2021-08-29 NOTE — ED Notes (Signed)
Back to room at this time accompanied by [name].

## 2021-08-29 NOTE — Discharge Instructions (Addendum)
You had a traumatic laceration of your forehead this was repaired with 3 stitches.  He will need to have these removed in 7-10 days by your primary care provider.  Please take Tylenol 1000 mg every 6 hours as needed for pain.  Please take your medications as prescribed.  Return to the ER for any new or concerning symptoms.

## 2021-08-29 NOTE — ED Notes (Signed)
Anthony Hayes the POA states patient always run a low heart rate in the 40's

## 2021-08-29 NOTE — ED Notes (Signed)
Patient is back in room 35

## 2021-08-29 NOTE — ED Notes (Signed)
15th on PTAR list

## 2021-08-29 NOTE — ED Provider Notes (Addendum)
[facility] EMERGENCY DEPARTMENT Provider Note   CSN: [id number] Arrival date & time: 08/29/21  1837     History Chief Complaint  Patient presents with   Anthony Hayes is a 86 y.o. male.  HPI Patient is an 86 year old male presented today to the emergency room brought in by EMS from [facility] facility is a DNR.  Per EMS patient had an unwitnessed fall today he usually is around in wheelchair.  He has a laceration to his forehead which was controlled with pressure dressing.  Apparently the fall was heard by bystanders who immediately came to his side and he was found to be bleeding from his forehead laceration on the floor.  Did not seem to be confused or altered from his baseline.  Level 5 caveat due to dementia    No past medical history on file.  There are no problems to display for this patient.   The histories are not reviewed yet. Please review them in the "History" navigator section and refresh this [place].     No family history on file.     Home Medications Prior to Admission medications   Medication Sig Start Date End Date Taking? Authorizing Provider  Amino Acids-Protein Hydrolys (PRO-STAT) LIQD Take 30 mLs by mouth daily at 8 pm. 08/11/21  Yes [provider]  amLODipine (NORVASC) 5 MG tablet Take 5 mg by mouth in the morning. 08/13/21  Yes [provider]  clopidogrel (PLAVIX) 75 MG tablet Take 75 mg by mouth in the morning. 08/13/21  Yes [provider]  loperamide (IMODIUM A-D) 2 MG tablet Take 2-4 mg by mouth See admin instructions. Take 4 mg by mouth after the 1st loose stool and 2 mg after each subsequent loose stool (max 6 doses/24 hours)   Yes [provider]  LORazepam (ATIVAN) 0.5 MG tablet Take 0.25 mg by mouth every 12 (twelve) hours as needed for anxiety (or agitation).   Yes [provider]  melatonin 3 MG TABS tablet Take 6 mg by mouth at bedtime.   Yes [provider]  Nutritional Supplements (CARNATION INSTANT BREAKFAST PO) Take 1 packet by mouth See admin instructions. Mix 1 packet into 8 ounces of milk and drink 2 times a day   Yes [provider]  prochlorperazine (COMPAZINE) 10 MG tablet Take 10 mg by mouth every 4 (four) hours as needed for nausea or vomiting.   Yes [provider]  REMEDY ANTIFUNGAL 2 % powder Apply 1 application topically See admin instructions. Apply powder to rash to the posterior of right thigh and surrounding buttock on Mon/Wed/Fri   Yes [provider]  sertraline (ZOLOFT) 100 MG tablet Take 100 mg by mouth in the morning.   Yes [provider]  sertraline (ZOLOFT) 50 MG tablet Take 50 mg by mouth in the morning.   Yes [provider]  Skin Protectants, Misc. (BAZA PROTECT EX) Apply 1 application topically See admin instructions. Apply to creases of buttocks every 12 hours   Yes [provider]    Allergies    Lisinopril  Review of Systems   Review of Systems  Constitutional:  Negative for chills and fever.  HENT:  Negative for congestion.   Eyes:  Negative for pain.  Respiratory:  Negative for cough and shortness of breath.   Cardiovascular:  Negative for chest pain and leg swelling.  Gastrointestinal:  Negative for abdominal pain and vomiting.  Genitourinary:  Negative  for dysuria.  Musculoskeletal:  Negative for myalgias.       Head trauma  Skin:  Positive for wound. Negative for rash.  Neurological:  Negative for dizziness and headaches.   Physical Exam Updated Vital Signs BP 126/69   Pulse (!) 46   Temp 98.5 F (36.9 C) (Oral)   Resp 16   Ht 5' 8"  (1.727 m)   Wt 70.3 kg   SpO2 96%   BMI 23.57 kg/m   Physical Exam Vitals and nursing note reviewed.  Constitutional:      General: He is not in acute distress. HENT:     Head: Normocephalic.     Nose: Nose normal.  Eyes:     General: No scleral icterus. Cardiovascular:     Rate and  Rhythm: Normal rate and regular rhythm.     Pulses: Normal pulses.     Heart sounds: Normal heart sounds.  Pulmonary:     Effort: Pulmonary effort is normal. No respiratory distress.     Breath sounds: No wheezing.  Abdominal:     Palpations: Abdomen is soft.     Tenderness: There is no abdominal tenderness. There is no guarding or rebound.  Musculoskeletal:     Cervical back: Normal range of motion.     Right lower leg: No edema.     Left lower leg: No edema.     Comments: No significant midline C, T, L spines with palpation.  Small sacral ulcer without evidence of infection.  Bilateral upper and lower extremities without palpable tenderness full range of motion of all lower extremities.  Able to lift all 4 extremities individually off the bed.  Head is with evidence of trauma to the forehead  Skin:    General: Skin is warm and dry.     Capillary Refill: Capillary refill takes less than 2 seconds.     Comments: 3.5 cm laceration that is upside down Y-shaped.  Neurological:     Mental Status: He is alert. Mental status is at baseline.  Psychiatric:        Mood and Affect: Mood normal.        Behavior: Behavior normal.    ED Results / Procedures / Treatments   Labs (all labs ordered are listed, but only abnormal results are displayed) Labs Reviewed  COMPREHENSIVE METABOLIC PANEL - Abnormal; Notable for the following components:      Result Value   BUN 31 (*)    All other components within normal limits  CBC WITH DIFFERENTIAL/PLATELET  CK  PROTIME-INR    EKG EKG Interpretation  Date/Time:  Friday August 29 2021 18:44:49 EDT Ventricular Rate:  47 PR Interval:    QRS Duration: 150 QT Interval:  510 QTC Calculation: 451 R Axis:   -75 Text Interpretation: Poor data quality probable sinus with prolonged pr RBBB and LAFB Left ventricular hypertrophy No old tracing to compare Confirmed by Dorie Rank [phone]) on 08/29/2021 6:51:21 PM  Radiology DG Chest 1 View  Result  Date: 08/29/2021 CLINICAL DATA:  Fall EXAM: CHEST  1 VIEW COMPARISON:  02/28/2020 FINDINGS: Linear atelectasis or scar left base. No consolidation or effusion. Cardiomegaly with aortic atherosclerosis. No pneumothorax. IMPRESSION: No active disease.  Cardiomegaly Electronically Signed   By: Donavan Foil M.D.   On: 08/29/2021 19:49   DG Pelvis 1-2 Views  Result Date: 08/29/2021 CLINICAL DATA:  Fall syncope EXAM: PELVIS - 1-2 VIEW COMPARISON:  09/17/2018 FINDINGS: SI joints are non widened. Pubic symphysis and rami appear  intact. No fracture or malalignment. Surgical hardware in the right femur incompletely visualized. IMPRESSION: No acute osseous abnormality Electronically Signed   By: Donavan Foil M.D.   On: 08/29/2021 19:50   CT HEAD WO CONTRAST (5MM)  Result Date: 08/29/2021 CLINICAL DATA:  Head/neck trauma EXAM: CT HEAD WITHOUT CONTRAST CT CERVICAL SPINE WITHOUT CONTRAST TECHNIQUE: Multidetector CT imaging of the head and cervical spine was performed following the standard protocol without intravenous contrast. Multiplanar CT image reconstructions of the cervical spine were also generated. COMPARISON:  CT head dated 11/22/2018. CT head/cervical spine dated 11/02/2018. FINDINGS: CT HEAD FINDINGS Brain: No evidence of acute infarction, hemorrhage, hydrocephalus, extra-axial collection or mass lesion/mass effect. Global cortical and central atrophy.  Secondary ventriculomegaly. Subcortical white matter and periventricular small vessel ischemic changes. Vascular: Intracranial atherosclerosis. Skull: Normal. Negative for fracture or focal lesion. Sinuses/Orbits: The visualized paranasal sinuses are essentially clear. The mastoid air cells are unopacified. Other: Moderate extracranial hematoma overlying the left frontal bone (series 5/image 18). CT CERVICAL SPINE FINDINGS Alignment: Normal cervical lordosis. Skull base and vertebrae: No acute fracture. No primary bone lesion or focal pathologic process. Soft  tissues and spinal canal: No prevertebral fluid or swelling. No visible canal hematoma. Disc levels: Mild multilevel degenerative changes. Spinal canal is patent. Upper chest: Visualized lung apices are clear. Other: Visualized thyroid is unremarkable. IMPRESSION: Moderate extracranial hematoma overlying the left frontal bone. No evidence of calvarial fracture. No evidence of acute intracranial abnormality. Atrophy with small vessel ischemic changes. No evidence of traumatic injury to the cervical spine. Mild multilevel degenerative changes. Electronically Signed   By: Julian Hy M.D.   On: 08/29/2021 19:48   CT Cervical Spine Wo Contrast  Result Date: 08/29/2021 CLINICAL DATA:  Head/neck trauma EXAM: CT HEAD WITHOUT CONTRAST CT CERVICAL SPINE WITHOUT CONTRAST TECHNIQUE: Multidetector CT imaging of the head and cervical spine was performed following the standard protocol without intravenous contrast. Multiplanar CT image reconstructions of the cervical spine were also generated. COMPARISON:  CT head dated 11/22/2018. CT head/cervical spine dated 11/02/2018. FINDINGS: CT HEAD FINDINGS Brain: No evidence of acute infarction, hemorrhage, hydrocephalus, extra-axial collection or mass lesion/mass effect. Global cortical and central atrophy.  Secondary ventriculomegaly. Subcortical white matter and periventricular small vessel ischemic changes. Vascular: Intracranial atherosclerosis. Skull: Normal. Negative for fracture or focal lesion. Sinuses/Orbits: The visualized paranasal sinuses are essentially clear. The mastoid air cells are unopacified. Other: Moderate extracranial hematoma overlying the left frontal bone (series 5/image 18). CT CERVICAL SPINE FINDINGS Alignment: Normal cervical lordosis. Skull base and vertebrae: No acute fracture. No primary bone lesion or focal pathologic process. Soft tissues and spinal canal: No prevertebral fluid or swelling. No visible canal hematoma. Disc levels: Mild multilevel  degenerative changes. Spinal canal is patent. Upper chest: Visualized lung apices are clear. Other: Visualized thyroid is unremarkable. IMPRESSION: Moderate extracranial hematoma overlying the left frontal bone. No evidence of calvarial fracture. No evidence of acute intracranial abnormality. Atrophy with small vessel ischemic changes. No evidence of traumatic injury to the cervical spine. Mild multilevel degenerative changes. Electronically Signed   By: Julian Hy M.D.   On: 08/29/2021 19:48    Procedures .Marland KitchenLaceration Repair  Date/Time: 08/29/2021 9:13 PM Performed by: Tedd Sias, PA Authorized by: Tedd Sias, PA   Consent:    Consent obtained:  Verbal   Consent given by:  Patient   Risks discussed:  Infection, need for additional repair, pain, poor cosmetic result and poor wound healing   Alternatives discussed:  No  treatment and delayed treatment Universal protocol:    Procedure explained and questions answered to patient or proxy's satisfaction: yes     Relevant documents present and verified: yes     Test results available: yes     Imaging studies available: yes     Required blood products, implants, devices, and special equipment available: yes     Site/side marked: yes     Immediately prior to procedure, a time out was called: yes     Patient identity confirmed:  Verbally with patient Anesthesia:    Anesthesia method:  Local infiltration   Local anesthetic:  Lidocaine 2% WITH epi Laceration details:    Location:  Face   Face location:  Forehead   Length (cm):  3.5 Pre-procedure details:    Preparation:  Imaging obtained to evaluate for foreign bodies Exploration:    Hemostasis achieved with:  Direct pressure and epinephrine (Single figure-of-eight stitch)   Wound extent: no foreign bodies/material noted and no tendon damage noted     Contaminated: no   Treatment:    Area cleansed with:  Saline   Amount of cleaning:  Standard   Irrigation solution:   Sterile saline   Irrigation volume:  Copious   Irrigation method:  Pressure wash   Visualized foreign bodies/material removed: no     Debridement:  None Skin repair:    Repair method:  Sutures   Suture technique:  Figure eight and simple interrupted Approximation:    Approximation:  Close Post-procedure details:    Dressing:  Antibiotic ointment and non-adherent dressing   Procedure completion:  Tolerated well, no immediate complications .Critical Care  Date/Time: 08/30/2021 12:58 AM Performed by: Tedd Sias, PA Authorized by: Tedd Sias, PA   Critical care provider statement:    Critical care time (minutes):  35   Critical care time was exclusive of:  Separately billable procedures and treating other patients and teaching time   Critical care was necessary to treat or prevent imminent or life-threatening deterioration of the following conditions:  Trauma   Critical care was time spent personally by me on the following activities:  Discussions with consultants, evaluation of patient's response to treatment, examination of patient, review of old charts, re-evaluation of patient's condition, pulse oximetry, ordering and review of radiographic studies, ordering and review of laboratory studies and ordering and performing treatments and interventions   I assumed direction of critical care for this patient from another provider in my specialty: no     Medications Ordered in ED Medications  bacitracin ointment (1 application Topical Given 08/29/21 2156)  lidocaine-EPINEPHrine (XYLOCAINE W/EPI) 2 %-1:200000 (PF) injection 10 mL (10 mLs Infiltration Given 08/29/21 1854)  acetaminophen (TYLENOL) tablet 650 mg (650 mg Oral Given 08/29/21 2156)  oxyCODONE-acetaminophen (PERCOCET/ROXICET) 5-325 MG per tablet 1 tablet (1 tablet Oral Given 08/29/21 2156)    ED Course  I have reviewed the triage vital signs and the nursing notes.  Pertinent labs & imaging results that were available during my  care of the patient were reviewed by me and considered in my medical decision making (see chart for details).  Clinical Course as of 08/30/21 0058  Fri Aug 29, 2021  2033 Moderate extracranial hematoma overlying the left frontal bone. No evidence of calvarial fracture.   No evidence of acute intracranial abnormality. Atrophy with small vessel ischemic changes.   No evidence of traumatic injury to the cervical spine. Mild multilevel degenerative changes.     [WF]  2033 I personally  reviewed plain films of chest pelvis.  Also reviewed CT head and C-spine.  Agree radiology read no fractures intracranial hemorrhage no cervical spine fractures.  Overall well-appearing. [WF]  2033 CMP unremarkable.  CK CBC unremarkable.  Coags within normal limits. [WF]  2111 Discussed with facility.  Also discussed with daughter at bedside. [WF]    Clinical Course User Index [WF] Tedd Sias, [place]   MDM Rules/Calculators/A&P                           Patient is 86 year old male usually does not walk and uses wheelchair however appears that he attempted to get out of his wheelchair earlier today and had an unobserved fall however the fall was heard he was evaluated immediately after the fall did not have any neurologic abnormalities were at his baseline mentation however he had a gash to his forehead and was brought to the ER for evaluation of this and for evaluation after a fall  On examination patient is not complaining of any pain at all.  He does have a history of dementia and is not a particularly good historian however he denies any syncope.  It seems that he was not syncopal/was completely oriented at his baseline when he was found moments after the fall.  CT imaging obtained and discussed above.  Lab work obtained and discussed above.  I was able to stop the bleeding from his forehead with a figure-of-eight stitch and CT scan was obtained after that.  I then repaired with 3 superficial simple  interrupted stitches.  Discussed with patient's daughter at bedside she states he is at his baseline mentation currently.  Understands need for follow-up with PCP and return precautions which daughter is able to teach back to me.  I discussed this case with my attending physician who cosigned this note including patient's presenting symptoms, physical exam, and planned diagnostics and interventions. Attending physician stated agreement with plan or made changes to plan which were implemented.   Final Clinical Impression(s) / ED Diagnoses Final diagnoses:  Fall, initial encounter  Injury of head, initial encounter  Laceration of scalp, initial encounter    Rx / DC Orders ED Discharge Orders     None        Tedd Sias, [place] 08/30/21 0057    Tedd Sias, PA 08/30/21 0349    Dorie Rank, MD 08/30/21 [phone]

## 2021-08-29 NOTE — ED Triage Notes (Signed)
BIB from [facility] by EMS. Unwitnessed fall today. Laceration to the right upper forehead, bleeding controled.

## 2021-08-29 NOTE — ED Notes (Signed)
[facility] aware pt is going back to facility. Discharge instructions gone over with Tobey Bride RN (staff of [facility]).

## 2021-08-30 NOTE — ED Notes (Signed)
Breakfast ordered, bandage on forehead replaced

## 2021-08-30 NOTE — ED Notes (Signed)
Patient resting in stretcher comfortably. Eyes closed, Equal chest rise and fall. Patient alert to verbal stimuli. Call bell in reach, Stretcher in low and locked position. Side rails up x2.   

## 2021-08-31 ENCOUNTER — Inpatient Hospital Stay (HOSPITAL_COMMUNITY)
Admission: EM | Admit: 2021-08-31 | Discharge: 2021-09-05 | DRG: 871 | Disposition: A | Discharge status: Other Acute Inpatient Hospital | Source: Skilled Nursing Facility | Attending: Internal Medicine | Admitting: Internal Medicine

## 2021-08-31 ENCOUNTER — Emergency Department (HOSPITAL_COMMUNITY)

## 2021-08-31 ENCOUNTER — Encounter (HOSPITAL_COMMUNITY): Payer: Self-pay

## 2021-08-31 ENCOUNTER — Other Ambulatory Visit: Payer: Self-pay

## 2021-08-31 DIAGNOSIS — K224 Dyskinesia of esophagus: Secondary | ICD-10-CM | POA: Diagnosis present

## 2021-08-31 DIAGNOSIS — Z8719 Personal history of other diseases of the digestive system: Secondary | ICD-10-CM

## 2021-08-31 DIAGNOSIS — I130 Hypertensive heart and chronic kidney disease with heart failure and stage 1 through stage 4 chronic kidney disease, or unspecified chronic kidney disease: Secondary | ICD-10-CM | POA: Diagnosis present

## 2021-08-31 DIAGNOSIS — T8133XA Disruption of traumatic injury wound repair, initial encounter: Secondary | ICD-10-CM | POA: Diagnosis not present

## 2021-08-31 DIAGNOSIS — R140 Abdominal distension (gaseous): Secondary | ICD-10-CM

## 2021-08-31 DIAGNOSIS — N179 Acute kidney failure, unspecified: Secondary | ICD-10-CM | POA: Diagnosis present

## 2021-08-31 DIAGNOSIS — I959 Hypotension, unspecified: Secondary | ICD-10-CM | POA: Diagnosis present

## 2021-08-31 DIAGNOSIS — W050XXA Fall from non-moving wheelchair, initial encounter: Secondary | ICD-10-CM | POA: Diagnosis present

## 2021-08-31 DIAGNOSIS — D500 Iron deficiency anemia secondary to blood loss (chronic): Secondary | ICD-10-CM | POA: Diagnosis present

## 2021-08-31 DIAGNOSIS — Z20822 Contact with and (suspected) exposure to covid-19: Secondary | ICD-10-CM | POA: Diagnosis present

## 2021-08-31 DIAGNOSIS — A4152 Sepsis due to Pseudomonas: Principal | ICD-10-CM | POA: Diagnosis present

## 2021-08-31 DIAGNOSIS — Z66 Do not resuscitate: Secondary | ICD-10-CM | POA: Diagnosis present

## 2021-08-31 DIAGNOSIS — R64 Cachexia: Secondary | ICD-10-CM | POA: Diagnosis present

## 2021-08-31 DIAGNOSIS — Z79899 Other long term (current) drug therapy: Secondary | ICD-10-CM

## 2021-08-31 DIAGNOSIS — Z79890 Hormone replacement therapy (postmenopausal): Secondary | ICD-10-CM

## 2021-08-31 DIAGNOSIS — Z87891 Personal history of nicotine dependence: Secondary | ICD-10-CM

## 2021-08-31 DIAGNOSIS — R001 Bradycardia, unspecified: Secondary | ICD-10-CM | POA: Diagnosis present

## 2021-08-31 DIAGNOSIS — F0390 Unspecified dementia without behavioral disturbance: Secondary | ICD-10-CM | POA: Diagnosis present

## 2021-08-31 DIAGNOSIS — R296 Repeated falls: Secondary | ICD-10-CM | POA: Diagnosis present

## 2021-08-31 DIAGNOSIS — M1990 Unspecified osteoarthritis, unspecified site: Secondary | ICD-10-CM | POA: Diagnosis present

## 2021-08-31 DIAGNOSIS — Z8249 Family history of ischemic heart disease and other diseases of the circulatory system: Secondary | ICD-10-CM

## 2021-08-31 DIAGNOSIS — G9340 Encephalopathy, unspecified: Secondary | ICD-10-CM | POA: Diagnosis present

## 2021-08-31 DIAGNOSIS — R7881 Bacteremia: Secondary | ICD-10-CM

## 2021-08-31 DIAGNOSIS — Z7902 Long term (current) use of antithrombotics/antiplatelets: Secondary | ICD-10-CM

## 2021-08-31 DIAGNOSIS — A419 Sepsis, unspecified organism: Secondary | ICD-10-CM

## 2021-08-31 DIAGNOSIS — K449 Diaphragmatic hernia without obstruction or gangrene: Secondary | ICD-10-CM | POA: Diagnosis present

## 2021-08-31 DIAGNOSIS — Z888 Allergy status to other drugs, medicaments and biological substances status: Secondary | ICD-10-CM

## 2021-08-31 DIAGNOSIS — K920 Hematemesis: Secondary | ICD-10-CM | POA: Diagnosis present

## 2021-08-31 DIAGNOSIS — F32A Depression, unspecified: Secondary | ICD-10-CM | POA: Diagnosis present

## 2021-08-31 DIAGNOSIS — I5032 Chronic diastolic (congestive) heart failure: Secondary | ICD-10-CM | POA: Diagnosis present

## 2021-08-31 DIAGNOSIS — K219 Gastro-esophageal reflux disease without esophagitis: Secondary | ICD-10-CM | POA: Diagnosis present

## 2021-08-31 DIAGNOSIS — E039 Hypothyroidism, unspecified: Secondary | ICD-10-CM | POA: Diagnosis present

## 2021-08-31 DIAGNOSIS — R1310 Dysphagia, unspecified: Secondary | ICD-10-CM

## 2021-08-31 DIAGNOSIS — H9190 Unspecified hearing loss, unspecified ear: Secondary | ICD-10-CM | POA: Diagnosis present

## 2021-08-31 DIAGNOSIS — E785 Hyperlipidemia, unspecified: Secondary | ICD-10-CM | POA: Diagnosis present

## 2021-08-31 DIAGNOSIS — E878 Other disorders of electrolyte and fluid balance, not elsewhere classified: Secondary | ICD-10-CM | POA: Diagnosis present

## 2021-08-31 DIAGNOSIS — Z993 Dependence on wheelchair: Secondary | ICD-10-CM

## 2021-08-31 DIAGNOSIS — Z8616 Personal history of COVID-19: Secondary | ICD-10-CM

## 2021-08-31 DIAGNOSIS — Z9049 Acquired absence of other specified parts of digestive tract: Secondary | ICD-10-CM

## 2021-08-31 DIAGNOSIS — A498 Other bacterial infections of unspecified site: Secondary | ICD-10-CM

## 2021-08-31 DIAGNOSIS — K222 Esophageal obstruction: Secondary | ICD-10-CM | POA: Diagnosis present

## 2021-08-31 DIAGNOSIS — Z6823 Body mass index (BMI) 23.0-23.9, adult: Secondary | ICD-10-CM

## 2021-08-31 DIAGNOSIS — Z8673 Personal history of transient ischemic attack (TIA), and cerebral infarction without residual deficits: Secondary | ICD-10-CM

## 2021-08-31 DIAGNOSIS — K922 Gastrointestinal hemorrhage, unspecified: Secondary | ICD-10-CM | POA: Diagnosis present

## 2021-08-31 DIAGNOSIS — N183 Chronic kidney disease, stage 3 unspecified: Secondary | ICD-10-CM | POA: Diagnosis present

## 2021-08-31 DIAGNOSIS — Z981 Arthrodesis status: Secondary | ICD-10-CM

## 2021-08-31 DIAGNOSIS — E872 Acidosis: Secondary | ICD-10-CM | POA: Diagnosis present

## 2021-08-31 DIAGNOSIS — G9341 Metabolic encephalopathy: Secondary | ICD-10-CM | POA: Diagnosis present

## 2021-08-31 DIAGNOSIS — S0181XA Laceration without foreign body of other part of head, initial encounter: Secondary | ICD-10-CM | POA: Diagnosis present

## 2021-08-31 DIAGNOSIS — D62 Acute posthemorrhagic anemia: Secondary | ICD-10-CM | POA: Diagnosis present

## 2021-08-31 DIAGNOSIS — N400 Enlarged prostate without lower urinary tract symptoms: Secondary | ICD-10-CM | POA: Diagnosis present

## 2021-08-31 DIAGNOSIS — Y838 Other surgical procedures as the cause of abnormal reaction of the patient, or of later complication, without mention of misadventure at the time of the procedure: Secondary | ICD-10-CM | POA: Diagnosis not present

## 2021-08-31 DIAGNOSIS — E8809 Other disorders of plasma-protein metabolism, not elsewhere classified: Secondary | ICD-10-CM | POA: Diagnosis present

## 2021-08-31 LAB — APTT: aPTT: 37 seconds — ABNORMAL HIGH (ref 24–36)

## 2021-08-31 LAB — PROTIME-INR
INR: 1.6 — ABNORMAL HIGH (ref 0.8–1.2)
Prothrombin Time: 19.2 seconds — ABNORMAL HIGH (ref 11.4–15.2)

## 2021-08-31 LAB — POC OCCULT BLOOD, ED: Fecal Occult Bld: NEGATIVE

## 2021-08-31 LAB — RESP PANEL BY RT-PCR (FLU A&B, COVID) ARPGX2
Influenza A by PCR: NEGATIVE
Influenza B by PCR: NEGATIVE
SARS Coronavirus 2 by RT PCR: NEGATIVE

## 2021-08-31 MED ORDER — VANCOMYCIN HCL IN DEXTROSE 1-5 GM/200ML-% IV SOLN: 1000.0000 mg | Freq: Once | INTRAVENOUS | Status: DC

## 2021-08-31 MED ORDER — METRONIDAZOLE 500 MG/100ML IV SOLN
500.0000 mg | Freq: Once | INTRAVENOUS | Status: AC
  Administered 2021-08-31: 500 mg via INTRAVENOUS
  Filled 2021-08-31: qty 100

## 2021-08-31 MED ORDER — VANCOMYCIN HCL 1500 MG/300ML IV SOLN
1500.0000 mg | Freq: Once | INTRAVENOUS | Status: AC
  Administered 2021-09-01: 1500 mg via INTRAVENOUS
  Filled 2021-08-31: qty 300

## 2021-08-31 MED ORDER — LACTATED RINGERS IV SOLN: INTRAVENOUS | Status: AC

## 2021-08-31 MED ORDER — LACTATED RINGERS IV BOLUS (SEPSIS): 250.0000 mL | Freq: Once | INTRAVENOUS | Status: DC

## 2021-08-31 MED ORDER — LACTATED RINGERS IV BOLUS (SEPSIS)
1000.0000 mL | Freq: Once | INTRAVENOUS | Status: AC
  Administered 2021-08-31: 1000 mL via INTRAVENOUS

## 2021-08-31 MED ORDER — SODIUM CHLORIDE 0.9 % IV SOLN
2.0000 g | Freq: Once | INTRAVENOUS | Status: AC
  Administered 2021-09-01: 2 g via INTRAVENOUS
  Filled 2021-08-31: qty 2

## 2021-08-31 NOTE — ED Provider Notes (Signed)
[facility] DEPT Provider Note   CSN: [id number] Arrival date & time: 08/31/21  1857     History Chief Complaint  Patient presents with   Altered Mental Status    Anthony Hayes is a 86 y.o. male.   Altered Mental Status  Patient presented to the ED for evaluation of altered mental status.  Patient is a resident of [facility] facility.  He was seen in the emergency room 2 days ago after a fall out of his wheelchair.  Patient sustained a scalp laceration.  He was evaluated in the ED had CT scans and repair of the laceration.  According to the nursing facility the patient began having bloody emesis and bloody diarrhea today.  This started this morning.  They also noticed a change in his mental status.  In the ED patient initially was lying on his side not following commands but during my evaluation he did start to answer some questions.  He however is unable to provide any history to me.  He does know that he is in the hospital Past Medical History:  Diagnosis Date   Arthritis    "joints" (10/15/2017)   Benign prostatic hyperplasia    Complication of anesthesia    "last OR in 06/2017 affected him cognitively; hasn't got back to baseline yet" (10/15/2017)   Dementia (HCC)    Depression    GERD (gastroesophageal reflux disease)    High cholesterol    Hypertension    Hypothyroidism    Renal disease    Renal disorder    TIA (transient ischemic attack) early 2000s    Patient Active Problem List   Diagnosis Date Noted   Altered mental status 01/15/2020   Pneumonia due to COVID-19 virus 01/14/2020   COVID-19 virus infection 12/19/2019   Protein-calorie malnutrition, severe ([place]) 12/19/2019   HCAP (healthcare-associated pneumonia) 11/22/2018   Fall 11/22/2018   Chronic diastolic CHF (congestive heart failure) ([place]) 11/22/2018   Hypoxia    Bradycardia 10/28/2018   Leg swelling 10/28/2018   Irregular heart beat 09/08/2018   Sleep disturbance     Stage 3 chronic kidney disease (HCC)    Benign essential HTN    Hypokalemia    Hyponatremia    Infarction of right basal ganglia ([place]) 04/06/2018   Pressure injury of skin 04/06/2018   Vitamin B12 deficiency    Hypothyroid    Crohn's disease with complication ([place])    Benign prostatic hyperplasia    Acute lower UTI    Dementia without behavioral disturbance ([place])    Acute ischemic stroke ([place]) 04/05/2018   Leukocytosis    Shortness of breath    Acute encephalopathy    CVA (cerebral vascular accident) ([place]) 04/03/2018   Calculus, kidney 01/21/2018   Acute blood loss anemia    Mallory-Weiss syndrome    Esophageal stricture    Respiratory failure ([place])    UGIB (upper gastrointestinal bleed) 10/16/2017   GERD (gastroesophageal reflux disease) 10/16/2017   Essential hypertension, benign 10/16/2017   Chest pain 10/15/2017   Hypothyroidism (acquired) 06/08/2017   Chronic kidney disease, stage III (moderate) 03/19/2016   Dyslipidemia 03/19/2016   Degeneration of lumbar or lumbosacral intervertebral disc 07/10/2008   Arthropathy, lower leg 05/23/2004    Past Surgical History:  Procedure Laterality Date   ABDOMINAL EXPLORATION SURGERY  7/   APPENDECTOMY     CATARACT EXTRACTION W/ INTRAOCULAR LENS  IMPLANT, BILATERAL Bilateral    COLECTOMY  1950s   "thought he had iteilis"  ESOPHAGOGASTRODUODENOSCOPY N/A 10/22/2017   Procedure: ESOPHAGOGASTRODUODENOSCOPY (EGD);  Surgeon: Gatha Mayer, MD;  Location[facility] END[facility]COPY;  Service: Endoscopy;  Laterality: N/A;   ESOPHAGOGASTRODUODENOSCOPY (EGD) WITH PROPOFOL N/A 10/16/2017   Procedure: ESOPHAGOGASTRODUODENOSCOPY (EGD) WITH PROPOFOL;  Surgeon: Milus Banister, MD;  Location: [facility] ENDOSCOPY;  Service: Endoscopy;  Laterality: N/A;   FEMUR FRACTURE SURGERY Right 1950s   FRACTURE SURGERY     KNEE ARTHROSCOPY Left    POSTERIOR LUMBAR FUSION     ROTATOR CUFF REPAIR Left    TONSILLECTOMY         Family History  Problem Relation Age  of Onset   Hypertension Father     Social History   Tobacco Use   Smoking status: Former    Years: 7.00    Types: Cigarettes    Quit date: 1963    Years since quitting: 59.7   Smokeless tobacco: Never  Vaping Use   Vaping Use: Never used  Substance Use Topics   Alcohol use: Yes    Comment: 10/15/2017 "a few drinks/year"   Drug use: No    Home Medications Prior to Admission medications   Medication Sig Start Date End Date Taking? Authorizing Provider  amLODipine (NORVASC) 5 MG tablet Take 1 tablet (5 mg total) by mouth daily. 11/26/18   Caren Griffins, MD  ascorbic acid (VITAMIN C) 500 MG tablet Take 1 tablet (500 mg total) by mouth daily. 01/19/20   Lucky Cowboy, MD  Cholecalciferol (VITAMIN D3) 50 MCG (2000 UT) capsule Take 2,000 Units by mouth daily.     [provider]  clopidogrel (PLAVIX) 75 MG tablet Take 1 tablet (75 mg total) by mouth daily. 11/29/18   Minus Breeding, MD  cyanocobalamin (,VITAMIN B-12,) 1000 MCG/ML injection Inject 1,000 mcg into the muscle every 30 (thirty) days.    [provider]  levothyroxine (SYNTHROID) 50 MCG tablet Take 50 mcg by mouth daily before breakfast.    [provider]  Melatonin 3 MG SUBL Place 6 mg under the tongue at bedtime.    [provider]  pantoprazole (PROTONIX) 40 MG tablet Take 1 tablet (40 mg total) by mouth daily. 04/05/18   Dhungel, Flonnie Overman, MD  pravastatin (PRAVACHOL) 10 MG tablet Take 1 tablet (10 mg total) by mouth daily. Patient taking differently: Take 10 mg by mouth at bedtime.  06/06/18   Frann Rider, NP  sertraline (ZOLOFT) 100 MG tablet Take 100 mg by mouth daily. Take with Sertraline 25 mg to equal 125 mg 08/17/18   [provider]  sertraline (ZOLOFT) 25 MG tablet Take 25 mg by mouth daily. Take with Sertraline 100 mg to equal 125 mg    [provider]  zinc sulfate 220 (50 Zn) MG capsule Take 1 capsule (220 mg total) by mouth daily. 01/19/20   Lucky Cowboy, MD    Allergies    Lisinopril  Review of Systems   Review of Systems  Unable to perform ROS: Acuity of condition   Physical Exam Updated Vital Signs BP (!) 92/52 (BP Location: Right Arm)   Pulse 87   Temp (!) 101.1 F (38.4 C) (Rectal)   Resp (!) 23   Ht 1.676 m (5' 6" )   Wt 66 kg   SpO2 98%   BMI 23.48 kg/m   Physical Exam Vitals and nursing note reviewed.  Constitutional:      Appearance: He is well-developed. He is ill-appearing and toxic-appearing.     Comments: Elderly, frail  HENT:     Head: Normocephalic.     Comments: Laceration noted mid forehead, sutures noted within the wound, questionable dehiscence but there appears to be some active bleeding from the wound, dried blood noted around oropharynx    Right Ear: External ear normal.     Left Ear: External ear normal.  Eyes:     General: No scleral icterus.       Right eye: No discharge.        Left eye: No discharge.     Conjunctiva/sclera: Conjunctivae normal.  Neck:     Trachea: No tracheal deviation.  Cardiovascular:     Rate and Rhythm: Normal rate and regular rhythm.  Pulmonary:     Effort: Pulmonary effort is normal. No respiratory distress.     Breath sounds: Normal breath sounds. No stridor. No wheezing or rales.  Abdominal:     General: Bowel sounds are normal. There is no distension.     Palpations: Abdomen is soft.     Tenderness: There is no abdominal tenderness. There is no guarding or rebound.  Genitourinary:    Comments: Tan stool noted on rectal exam Musculoskeletal:        General: No deformity.     Cervical back: Neck supple.     Comments: Large amount of bruising noted left forearm and left hand  Skin:    General: Skin is warm and dry.     Findings: No rash.  Neurological:     Cranial Nerves: Cranial nerve deficit (No obvious facial droop, eyes deviating towards the right) present.     Sensory: Sensory deficit present.     Motor: No abnormal muscle tone or seizure activity.      Comments: Patient not consistently following commands, he is lying on his left side, will move his right arm somewhat when I asked him to, states it is difficult to move his left arm  Psychiatric:        Mood and Affect: Mood normal.    ED Results / Procedures / Treatments   Labs (all labs ordered are listed, but only abnormal results are displayed) Labs Reviewed  CULTURE, BLOOD (SINGLE)  TYPE AND SCREEN    EKG None  Radiology No results found.  Procedures .Critical Care  Date/Time: 09/01/2021 12:28 AM Performed by: Dorie Rank, MD Authorized by: Dorie Rank, MD   Critical care provider statement:    Critical care time (minutes):  45   Critical care was time spent personally by me on the following activities:  Discussions with consultants, evaluation of patient's response to treatment, examination of patient, ordering and performing treatments and interventions, ordering and review of laboratory studies, ordering and review of radiographic studies, pulse oximetry, re-evaluation of patient's condition, obtaining history from patient or surrogate and review of old charts   Medications Ordered in ED Medications  lactated ringers infusion (has no administration in time range)  ceFEPIme (MAXIPIME) 2 g in sodium chloride 0.9 % 100 mL IVPB (has no administration in time range)  metroNIDAZOLE (FLAGYL) IVPB 500 mg (has no administration in time range)  vancomycin (VANCOCIN) IVPB 1000 mg/200 mL premix (has no administration in time range)  lactated ringers bolus 1,000 mL (has no administration in time range)    And  lactated ringers bolus 1,000 mL (has no administration in time range)    And  lactated ringers bolus 250 mL (has no administration in time range)    ED Course  I have reviewed the triage vital  signs and the nursing notes.  Pertinent labs & imaging results that were available during my care of the patient were reviewed by me and considered in my medical decision making  (see chart for details).  Clinical Course as of 09/01/21 Cindy Hazy Aug 31, 2021  2026 Fever and hypotension noted.  We will treat for possible sepsis. [JK]  2026 Altered mental status and his recent head traumas concerning for the possibility of cerebral hemorrhage.  We will proceed with stat head CT [JK]  2231 Discussed goals of care with family.  Agrees with abx, fluids.  No intubation, aggressive care. [PN]  3614 Blood pressure is improved.  Now 118/72 [JK]    Clinical Course User Index [JK] Dorie Rank, MD   MDM Rules/Calculators/A&P                           Pt presented with altered mental status.  Noted to have fever , concerning for sepsis. Also concerning for possible occult head injury from his fall two days ago.  Bleeding noted from his prior head wound.  Staples placed to help with hemostasis.  CT scan without hemorrhage.  Dried blood around noted around the mouth.  No vomiting in the ED.   Hemoccult negative.  Discussed treatment with family, he is hospice DNR.  Will continue with abx, fluids.  Will need admission.  Labs pending at shift change.  Care turned over to Dr Oliver Hum Final Clinical Impression(s) / ED Diagnoses  Pending   Dorie Rank, MD 09/01/21 [phone]

## 2021-08-31 NOTE — ED Triage Notes (Signed)
Patient BIB [facility] from [facility] for Altered Mental Status and bloody emesis and bloody diarrhea. Staff reports that after breakfast vomiting and diarrhea started and mental status was decreasing. This is not patient's baseline.

## 2021-08-31 NOTE — Sepsis Progress Note (Signed)
[facility] following for Sepsis Protocol 

## 2021-08-31 NOTE — Progress Notes (Signed)
A consult was received from an ED physician for Vancomycin and Cefepime per pharmacy dosing.  The patient's profile has been reviewed for ht/wt/allergies/indication/available labs.    A one time order has been placed for Vancomycin 1510m IV and Cefepime 2g IV.  Further antibiotics/pharmacy consults should be ordered by admitting physician if indicated.                       Thank you, GLuiz Ochoa9/03/2021  8:17 PM

## 2021-08-31 NOTE — ED Provider Notes (Signed)
  Provider Note MRN:  [id number]  Arrival date & time: 09/01/21    ED Course and Medical Decision Making  Assumed care from Dr. [name] at shift change.  Concern for central additionally hypotensive but improved with fluids.  Delay in blood work, will admit after lab results.  1 AM update: Patient is ill-appearing, he is hypotensive in the 80s despite 2 L of crystalloid.  Labs are revealing a significant drop in his hemoglobin going from 12-7.  Concern for internal bleeding, possibly GI versus traumatic given the recent fall.  Discussed in length the concerns with family, possible septic or hemorrhagic shock.  Discussed patient's poor quality of life, the concern that this acute illness may take his life, the concerned that aggressive measures may only prolong suffering.  Management options discussed, patient's daughters are going to discuss it with their hospice contacts and we will readdress.  1:45 AM update: After discussion with the hospice team, plan is to not escalate.  We will continue fluids and antibiotics, no blood, no pressors, plan is for reevaluation in the morning.  Family is aware that this may lead to deterioration and death.  They explained that they are okay with this outcome.  They do not want to be over aggressive and cause suffering, but they also do not want to remove all monitoring and go to full comfort care just yet because this would feel like "pulling the plug".  Will admit to medicine for further management and continued care discussions.  .Critical Care  Date/Time: 09/01/2021 1:25 AM Performed by: Maudie Flakes, MD Authorized by: Maudie Flakes, MD   Critical care provider statement:    Critical care time (minutes):  45   Critical care was necessary to treat or prevent imminent or life-threatening deterioration of the following conditions:  Shock   Critical care was time spent personally by me on the following activities:  Discussions with consultants, evaluation of  patient's response to treatment, examination of patient, ordering and performing treatments and interventions, ordering and review of laboratory studies, ordering and review of radiographic studies, pulse oximetry, re-evaluation of patient's condition, obtaining history from patient or surrogate and review of old charts  Final Clinical Impressions(s) / ED Diagnoses     ICD-10-CM   1. Sepsis, due to unspecified organism, unspecified whether acute organ dysfunction present [facility])  A41.9       ED Discharge Orders     None       Discharge Instructions   None     Barth Kirks. [place] Small, [place] [email or web address]    Maudie Flakes, MD 09/01/21 0200

## 2021-09-01 ENCOUNTER — Inpatient Hospital Stay (HOSPITAL_COMMUNITY)

## 2021-09-01 DIAGNOSIS — I9589 Other hypotension: Secondary | ICD-10-CM

## 2021-09-01 DIAGNOSIS — R64 Cachexia: Secondary | ICD-10-CM | POA: Diagnosis present

## 2021-09-01 DIAGNOSIS — E8809 Other disorders of plasma-protein metabolism, not elsewhere classified: Secondary | ICD-10-CM

## 2021-09-01 DIAGNOSIS — R1312 Dysphagia, oropharyngeal phase: Secondary | ICD-10-CM | POA: Diagnosis not present

## 2021-09-01 DIAGNOSIS — G9341 Metabolic encephalopathy: Secondary | ICD-10-CM | POA: Diagnosis present

## 2021-09-01 DIAGNOSIS — I5032 Chronic diastolic (congestive) heart failure: Secondary | ICD-10-CM | POA: Diagnosis present

## 2021-09-01 DIAGNOSIS — A498 Other bacterial infections of unspecified site: Secondary | ICD-10-CM | POA: Diagnosis not present

## 2021-09-01 DIAGNOSIS — N179 Acute kidney failure, unspecified: Secondary | ICD-10-CM

## 2021-09-01 DIAGNOSIS — R6510 Systemic inflammatory response syndrome (SIRS) of non-infectious origin without acute organ dysfunction: Secondary | ICD-10-CM

## 2021-09-01 DIAGNOSIS — F0390 Unspecified dementia without behavioral disturbance: Secondary | ICD-10-CM | POA: Diagnosis present

## 2021-09-01 DIAGNOSIS — D62 Acute posthemorrhagic anemia: Secondary | ICD-10-CM

## 2021-09-01 DIAGNOSIS — D500 Iron deficiency anemia secondary to blood loss (chronic): Secondary | ICD-10-CM | POA: Diagnosis not present

## 2021-09-01 DIAGNOSIS — K222 Esophageal obstruction: Secondary | ICD-10-CM | POA: Diagnosis present

## 2021-09-01 DIAGNOSIS — E861 Hypovolemia: Secondary | ICD-10-CM

## 2021-09-01 DIAGNOSIS — N400 Enlarged prostate without lower urinary tract symptoms: Secondary | ICD-10-CM | POA: Diagnosis present

## 2021-09-01 DIAGNOSIS — W050XXA Fall from non-moving wheelchair, initial encounter: Secondary | ICD-10-CM | POA: Diagnosis present

## 2021-09-01 DIAGNOSIS — N183 Chronic kidney disease, stage 3 unspecified: Secondary | ICD-10-CM | POA: Diagnosis present

## 2021-09-01 DIAGNOSIS — R001 Bradycardia, unspecified: Secondary | ICD-10-CM | POA: Diagnosis present

## 2021-09-01 DIAGNOSIS — A4152 Sepsis due to Pseudomonas: Secondary | ICD-10-CM | POA: Diagnosis present

## 2021-09-01 DIAGNOSIS — Z7189 Other specified counseling: Secondary | ICD-10-CM

## 2021-09-01 DIAGNOSIS — K922 Gastrointestinal hemorrhage, unspecified: Secondary | ICD-10-CM | POA: Diagnosis present

## 2021-09-01 DIAGNOSIS — R933 Abnormal findings on diagnostic imaging of other parts of digestive tract: Secondary | ICD-10-CM | POA: Diagnosis not present

## 2021-09-01 DIAGNOSIS — E872 Acidosis: Secondary | ICD-10-CM | POA: Diagnosis present

## 2021-09-01 DIAGNOSIS — K920 Hematemesis: Secondary | ICD-10-CM | POA: Diagnosis present

## 2021-09-01 DIAGNOSIS — W19XXXD Unspecified fall, subsequent encounter: Secondary | ICD-10-CM

## 2021-09-01 DIAGNOSIS — E785 Hyperlipidemia, unspecified: Secondary | ICD-10-CM | POA: Diagnosis present

## 2021-09-01 DIAGNOSIS — G9340 Encephalopathy, unspecified: Secondary | ICD-10-CM | POA: Diagnosis not present

## 2021-09-01 DIAGNOSIS — A048 Other specified bacterial intestinal infections: Secondary | ICD-10-CM | POA: Diagnosis not present

## 2021-09-01 DIAGNOSIS — T8133XA Disruption of traumatic injury wound repair, initial encounter: Secondary | ICD-10-CM | POA: Diagnosis not present

## 2021-09-01 DIAGNOSIS — A419 Sepsis, unspecified organism: Secondary | ICD-10-CM | POA: Diagnosis not present

## 2021-09-01 DIAGNOSIS — M1990 Unspecified osteoarthritis, unspecified site: Secondary | ICD-10-CM | POA: Diagnosis present

## 2021-09-01 DIAGNOSIS — F32A Depression, unspecified: Secondary | ICD-10-CM | POA: Diagnosis present

## 2021-09-01 DIAGNOSIS — I959 Hypotension, unspecified: Secondary | ICD-10-CM

## 2021-09-01 DIAGNOSIS — Y92129 Unspecified place in nursing home as the place of occurrence of the external cause: Secondary | ICD-10-CM

## 2021-09-01 DIAGNOSIS — Z20822 Contact with and (suspected) exposure to covid-19: Secondary | ICD-10-CM | POA: Diagnosis present

## 2021-09-01 DIAGNOSIS — K219 Gastro-esophageal reflux disease without esophagitis: Secondary | ICD-10-CM | POA: Diagnosis present

## 2021-09-01 DIAGNOSIS — E039 Hypothyroidism, unspecified: Secondary | ICD-10-CM | POA: Diagnosis present

## 2021-09-01 DIAGNOSIS — Y838 Other surgical procedures as the cause of abnormal reaction of the patient, or of later complication, without mention of misadventure at the time of the procedure: Secondary | ICD-10-CM | POA: Diagnosis not present

## 2021-09-01 DIAGNOSIS — I130 Hypertensive heart and chronic kidney disease with heart failure and stage 1 through stage 4 chronic kidney disease, or unspecified chronic kidney disease: Secondary | ICD-10-CM | POA: Diagnosis present

## 2021-09-01 DIAGNOSIS — Z8616 Personal history of COVID-19: Secondary | ICD-10-CM | POA: Diagnosis not present

## 2021-09-01 DIAGNOSIS — Z66 Do not resuscitate: Secondary | ICD-10-CM | POA: Diagnosis present

## 2021-09-01 DIAGNOSIS — R7989 Other specified abnormal findings of blood chemistry: Secondary | ICD-10-CM

## 2021-09-01 DIAGNOSIS — E878 Other disorders of electrolyte and fluid balance, not elsewhere classified: Secondary | ICD-10-CM

## 2021-09-01 LAB — CBC WITH DIFFERENTIAL/PLATELET
Abs Immature Granulocytes: 0.17 10*3/uL — ABNORMAL HIGH (ref 0.00–0.07)
Basophils Absolute: 0 10*3/uL (ref 0.0–0.1)
Basophils Relative: 0 %
Eosinophils Absolute: 0 10*3/uL (ref 0.0–0.5)
Eosinophils Relative: 0 %
HCT: 22.6 % — ABNORMAL LOW (ref 39.0–52.0)
Hemoglobin: 7.3 g/dL — ABNORMAL LOW (ref 13.0–17.0)
Immature Granulocytes: 1 %
Lymphocytes Relative: 1 %
Lymphs Abs: 0.2 10*3/uL — ABNORMAL LOW (ref 0.7–4.0)
MCH: 31.2 pg (ref 26.0–34.0)
MCHC: 32.3 g/dL (ref 30.0–36.0)
MCV: 96.6 fL (ref 80.0–100.0)
Monocytes Absolute: 0.3 10*3/uL (ref 0.1–1.0)
Monocytes Relative: 2 %
Neutro Abs: 15 10*3/uL — ABNORMAL HIGH (ref 1.7–7.7)
Neutrophils Relative %: 96 %
Platelets: 137 10*3/uL — ABNORMAL LOW (ref 150–400)
RBC: 2.34 MIL/uL — ABNORMAL LOW (ref 4.22–5.81)
RDW: 14 % (ref 11.5–15.5)
WBC: 15.7 10*3/uL — ABNORMAL HIGH (ref 4.0–10.5)
nRBC: 0 % (ref 0.0–0.2)

## 2021-09-01 LAB — URINALYSIS, ROUTINE W REFLEX MICROSCOPIC
Bilirubin Urine: NEGATIVE
Glucose, UA: NEGATIVE mg/dL
Ketones, ur: NEGATIVE mg/dL
Nitrite: NEGATIVE
Protein, ur: NEGATIVE mg/dL
Specific Gravity, Urine: 1.02 (ref 1.005–1.030)
pH: 6 (ref 5.0–8.0)

## 2021-09-01 LAB — CBC
HCT: 27.5 % — ABNORMAL LOW (ref 39.0–52.0)
Hemoglobin: 9.6 g/dL — ABNORMAL LOW (ref 13.0–17.0)
MCH: 31.3 pg (ref 26.0–34.0)
MCHC: 34.9 g/dL (ref 30.0–36.0)
MCV: 89.6 fL (ref 80.0–100.0)
Platelets: 163 10*3/uL (ref 150–400)
RBC: 3.07 MIL/uL — ABNORMAL LOW (ref 4.22–5.81)
RDW: 14 % (ref 11.5–15.5)
WBC: 28.6 10*3/uL — ABNORMAL HIGH (ref 4.0–10.5)
nRBC: 0 % (ref 0.0–0.2)

## 2021-09-01 LAB — COMPREHENSIVE METABOLIC PANEL
ALT: 15 U/L (ref 0–44)
ALT: 20 U/L (ref 0–44)
AST: 30 U/L (ref 15–41)
AST: 41 U/L (ref 15–41)
Albumin: 1.8 g/dL — ABNORMAL LOW (ref 3.5–5.0)
Albumin: 2.8 g/dL — ABNORMAL LOW (ref 3.5–5.0)
Alkaline Phosphatase: 70 U/L (ref 38–126)
Alkaline Phosphatase: 90 U/L (ref 38–126)
Anion gap: 12 (ref 5–15)
Anion gap: 9 (ref 5–15)
BUN: 21 mg/dL (ref 8–23)
BUN: 29 mg/dL — ABNORMAL HIGH (ref 8–23)
CO2: 16 mmol/L — ABNORMAL LOW (ref 22–32)
CO2: 20 mmol/L — ABNORMAL LOW (ref 22–32)
Calcium: 7.4 mg/dL — ABNORMAL LOW (ref 8.9–10.3)
Calcium: 8.6 mg/dL — ABNORMAL LOW (ref 8.9–10.3)
Chloride: 106 mmol/L (ref 98–111)
Chloride: 109 mmol/L (ref 98–111)
Creatinine, Ser: 0.8 mg/dL (ref 0.61–1.24)
Creatinine, Ser: 1.33 mg/dL — ABNORMAL HIGH (ref 0.61–1.24)
GFR, Estimated: >60 mL/min (ref >60)
GFR, Estimated: 53 mL/min — ABNORMAL LOW (ref >60)
Glucose, Bld: 67 mg/dL — ABNORMAL LOW (ref 70–99)
Glucose, Bld: 85 mg/dL (ref 70–99)
Potassium: 3.6 mmol/L (ref 3.5–5.1)
Potassium: 3.7 mmol/L (ref 3.5–5.1)
Sodium: 134 mmol/L — ABNORMAL LOW (ref 135–145)
Sodium: 138 mmol/L (ref 135–145)
Total Bilirubin: 0.5 mg/dL (ref 0.3–1.2)
Total Bilirubin: 0.9 mg/dL (ref 0.3–1.2)
Total Protein: 3.6 g/dL — ABNORMAL LOW (ref 6.5–8.1)
Total Protein: 5.7 g/dL — ABNORMAL LOW (ref 6.5–8.1)

## 2021-09-01 LAB — MRSA NEXT GEN BY PCR, NASAL: MRSA by PCR Next Gen: NOT DETECTED

## 2021-09-01 LAB — BLOOD GAS, VENOUS
Acid-base deficit: 6.4 mmol/L — ABNORMAL HIGH (ref 0.0–2.0)
Bicarbonate: 16.5 mmol/L — ABNORMAL LOW (ref 20.0–28.0)
O2 Saturation: 47.2 %
Patient temperature: 98.6
pCO2, Ven: 26.7 mmHg — ABNORMAL LOW (ref 44.0–60.0)
pH, Ven: 7.407 (ref 7.250–7.430)
pO2, Ven: 29.3 mmHg — CL (ref 32.0–45.0)

## 2021-09-01 LAB — HEMOGLOBIN AND HEMATOCRIT, BLOOD
HCT: 38.1 % — ABNORMAL LOW (ref 39.0–52.0)
HCT: 23.8 % — ABNORMAL LOW (ref 39.0–52.0)
Hemoglobin: 12.5 g/dL — ABNORMAL LOW (ref 13.0–17.0)
Hemoglobin: 8.3 g/dL — ABNORMAL LOW (ref 13.0–17.0)

## 2021-09-01 LAB — TYPE AND SCREEN
ABO/RH(D): O POS
Antibody Screen: NEGATIVE

## 2021-09-01 LAB — LACTIC ACID, PLASMA
Lactic Acid, Venous: 3.2 mmol/L (ref 0.5–1.9)
Lactic Acid, Venous: 2.5 mmol/L (ref 0.5–1.9)

## 2021-09-01 LAB — PROCALCITONIN
Procalcitonin: 72.4 ng/mL
Procalcitonin: 79.54 ng/mL

## 2021-09-01 LAB — CK: Total CK: 317 U/L (ref 49–397)

## 2021-09-01 LAB — CORTISOL-AM, BLOOD: Cortisol - AM: 41.1 ug/dL — ABNORMAL HIGH (ref 6.7–22.6)

## 2021-09-01 LAB — PROTIME-INR
INR: 1.2 (ref 0.8–1.2)
Prothrombin Time: 15.4 seconds — ABNORMAL HIGH (ref 11.4–15.2)

## 2021-09-01 MED ORDER — SODIUM CHLORIDE 0.9 % IV BOLUS
1000.0000 mL | Freq: Once | INTRAVENOUS | Status: AC
  Administered 2021-09-01: 1000 mL via INTRAVENOUS

## 2021-09-01 MED ORDER — ONDANSETRON HCL 4 MG PO TABS: 4.0000 mg | ORAL_TABLET | Freq: Four times a day (QID) | ORAL | Status: DC | PRN

## 2021-09-01 MED ORDER — ACETAMINOPHEN 325 MG PO TABS: 650.0000 mg | ORAL_TABLET | Freq: Four times a day (QID) | ORAL | Status: DC | PRN

## 2021-09-01 MED ORDER — LORAZEPAM 0.5 MG PO TABS
0.2500 mg | ORAL_TABLET | Freq: Once | ORAL | Status: AC
  Administered 2021-09-01: 0.25 mg via ORAL
  Filled 2021-09-01: qty 1

## 2021-09-01 MED ORDER — HYDROCORTISONE NA SUCCINATE PF 100 MG IJ SOLR
100.0000 mg | Freq: Two times a day (BID) | INTRAMUSCULAR | Status: DC
  Administered 2021-09-01 – 2021-09-02 (×3): 100 mg via INTRAVENOUS
  Filled 2021-09-01 (×3): qty 2

## 2021-09-01 MED ORDER — METRONIDAZOLE 500 MG/100ML IV SOLN
500.0000 mg | Freq: Two times a day (BID) | INTRAVENOUS | Status: DC
  Administered 2021-09-01 – 2021-09-03 (×5): 500 mg via INTRAVENOUS
  Filled 2021-09-01 (×5): qty 100

## 2021-09-01 MED ORDER — SODIUM CHLORIDE 0.9 % IV SOLN: 2.0000 g | Freq: Three times a day (TID) | INTRAVENOUS | Status: DC

## 2021-09-01 MED ORDER — VANCOMYCIN VARIABLE DOSE PER UNSTABLE RENAL FUNCTION (PHARMACIST DOSING): Status: DC

## 2021-09-01 MED ORDER — ACETAMINOPHEN 650 MG RE SUPP: 650.0000 mg | Freq: Four times a day (QID) | RECTAL | Status: DC | PRN

## 2021-09-01 MED ORDER — VANCOMYCIN HCL 750 MG/150ML IV SOLN: 750.0000 mg | INTRAVENOUS | Status: DC

## 2021-09-01 MED ORDER — LACTATED RINGERS IV BOLUS
1000.0000 mL | Freq: Once | INTRAVENOUS | Status: AC
  Administered 2021-09-01: 1000 mL via INTRAVENOUS

## 2021-09-01 MED ORDER — LACTATED RINGERS IV SOLN: INTRAVENOUS | Status: AC

## 2021-09-01 MED ORDER — SODIUM CHLORIDE 0.9 % IV SOLN
2.0000 g | Freq: Two times a day (BID) | INTRAVENOUS | Status: DC
  Administered 2021-09-01 – 2021-09-03 (×5): 2 g via INTRAVENOUS
  Filled 2021-09-01 (×6): qty 2

## 2021-09-01 MED ORDER — SODIUM CHLORIDE 0.9 % IV SOLN: INTRAVENOUS | Status: DC | PRN

## 2021-09-01 MED ORDER — PANTOPRAZOLE SODIUM 40 MG IV SOLR
40.0000 mg | Freq: Two times a day (BID) | INTRAVENOUS | Status: DC
  Administered 2021-09-01 – 2021-09-04 (×8): 40 mg via INTRAVENOUS
  Filled 2021-09-01 (×8): qty 40

## 2021-09-01 MED ORDER — ONDANSETRON HCL 4 MG/2ML IJ SOLN: 4.0000 mg | Freq: Four times a day (QID) | INTRAMUSCULAR | Status: DC | PRN

## 2021-09-01 MED ORDER — CHLORHEXIDINE GLUCONATE CLOTH 2 % EX PADS
6.0000 | MEDICATED_PAD | Freq: Every day | CUTANEOUS | Status: DC
  Administered 2021-09-01 – 2021-09-04 (×4): 6 via TOPICAL

## 2021-09-01 MED ORDER — ORAL CARE MOUTH RINSE
15.0000 mL | Freq: Two times a day (BID) | OROMUCOSAL | Status: DC
  Administered 2021-09-01 – 2021-09-04 (×6): 15 mL via OROMUCOSAL

## 2021-09-01 MED ORDER — MORPHINE SULFATE (PF) 4 MG/ML IV SOLN
4.0000 mg | INTRAVENOUS | Status: DC | PRN
  Filled 2021-09-01: qty 1

## 2021-09-01 NOTE — Progress Notes (Signed)
Pharmacy Antibiotic Note  Anthony Hayes is a 86 y.o. male admitted on 08/31/2021 with sepsis.  In the ED, patient received Vancomycin 1574m IV, Cefepime 2gm IV and Metronidazole 5092mIV x 1 dose each.  Upon admission, Pharmacy has been consulted for Cefepime dosing.  Plan: Cefepime 2gm IV q8h Follow renal function F/U culture results and sensitivities  Height: 5' 6"  (167.6 cm) Weight: 66 kg (145 lb 8.1 oz) IBW/kg (Calculated) : 63.8  Temp (24hrs), Avg:101.1 F (38.4 C), Min:101.1 F (38.4 C), Max:101.1 F (38.4 C)  Recent Labs  Lab 08/31/21 2300  WBC 15.7*  CREATININE 0.80    Estimated Creatinine Clearance: 62 mL/min (by C-G formula based on SCr of 0.8 mg/dL).    Allergies  Allergen Reactions   Lisinopril Swelling    Swelling of lips     Antimicrobials this admission: 9/4 Metronidazole x 1 9/5 Vancomycin x 1 9/5 Cefepime >>  Dose adjustments this admission:    Microbiology results: 9/5 BCx:    Thank you for allowing pharmacy to be a part of this patient's care.  PoEverette RankPharmD 09/01/2021 4:20 AM

## 2021-09-01 NOTE — Evaluation (Signed)
Clinical/Bedside Swallow Evaluation Patient Details  Name: Anthony Hayes MRN: [id number] Date of Birth: [birth year]  Today's Date: 09/01/2021 Time: SLP Start Time (ACUTE ONLY): 1330 SLP Stop Time (ACUTE ONLY): 4163 SLP Time Calculation (min) (ACUTE ONLY): 15 min  Past Medical History:  Past Medical History:  Diagnosis Date   Arthritis    "joints" (10/15/2017)   Benign prostatic hyperplasia    Complication of anesthesia    "last OR in 06/2017 affected him cognitively; hasn't got back to baseline yet" (10/15/2017)   Dementia ([place])    Depression    GERD (gastroesophageal reflux disease)    High cholesterol    Hypertension    Hypothyroidism    Renal disease    Renal disorder    TIA (transient ischemic attack) early 2000s   Past Surgical History:  Past Surgical History:  Procedure Laterality Date   ABDOMINAL EXPLORATION SURGERY  7/   APPENDECTOMY     CATARACT EXTRACTION W/ INTRAOCULAR LENS  IMPLANT, BILATERAL Bilateral    COLECTOMY  1950s   "thought he had iteilis"   ESOPHAGOGASTRODUODENOSCOPY N/A 10/22/2017   Procedure: ESOPHAGOGASTRODUODENOSCOPY (EGD);  Surgeon: Gatha Mayer, MD;  Location: [facility] ENDOSCOPY;  Service: Endoscopy;  Laterality: N/A;   ESOPHAGOGASTRODUODENOSCOPY (EGD) WITH PROPOFOL N/A 10/16/2017   Procedure: ESOPHAGOGASTRODUODENOSCOPY (EGD) WITH PROPOFOL;  Surgeon: Milus Banister, MD;  Location: [facility] ENDOSCOPY;  Service: Endoscopy;  Laterality: N/A;   FEMUR FRACTURE SURGERY Right 1950s   FRACTURE SURGERY     KNEE ARTHROSCOPY Left    POSTERIOR LUMBAR FUSION     ROTATOR CUFF REPAIR Left    TONSILLECTOMY     HPI:  Patient is an 86 y.o. male with PMH: dementia on hospice, HTN, hypothyroidism, BPH, GERD, esophageal tear, and osteoarthritis brought to ED by EMS with concern for hematemesis, hematochezia and mental status change, and admitted for ABLA from acute GI bleed, SIRS and hypotension.  On admission he was febrile, hypotensive. No significant findings from CXR and CT  head only showed frontal and left preseptal soft tissue swelling where he hit his head on September 2. CT abdomen and pelvis concerning for cystitis.   Assessment / Plan / Recommendation Clinical Impression  Patient presents with a mild oropharyngeal dysphagia with suspected delayed swallow initiation and decreased mastication with ice chips, but no overt s/s aspiration or penetration. Patient was in bed, able to alert to respond to questions and accept small amount of ice chips, water sips, but overall is lethargic and fatigued. When asked where he was, he was not able to give coherent response, saying "Im up in the levels...." Patient is to be getting an esophagram secondary to h/o esophageal tear. SLP plans to f/u with patient after esophagram for PO trials when able. SLP Visit Diagnosis: Dysphagia, unspecified (R13.10)    Aspiration Risk  Mild aspiration risk;Moderate aspiration risk    Diet Recommendation NPO;Ice chips PRN after oral care;Other (Comment) (small amount ice chips/water sips PRN after oral care)   Medication Administration: Via alternative means    Other  Recommendations Oral Care Recommendations: Oral care BID;Staff/trained caregiver to provide oral care   Follow up Recommendations Other (comment) (TBD)      Frequency and Duration min 2x/week  1 week       Prognosis Prognosis for Safe Diet Advancement: Good      Swallow Study   General Date of Onset: 09/01/21 HPI: Patient is an 86 y.o. male with PMH: dementia on hospice, HTN, hypothyroidism, BPH, GERD, esophageal tear, and osteoarthritis  brought to ED by EMS with concern for hematemesis, hematochezia and mental status change, and admitted for ABLA from acute GI bleed, SIRS and hypotension.  On admission he was febrile, hypotensive. No significant findings from CXR and CT head only showed frontal and left preseptal soft tissue swelling where he hit his head on September 2. CT abdomen and pelvis concerning for  cystitis. Type of Study: Bedside Swallow Evaluation Previous Swallow Assessment: most recent from 12/21/2019 during previous hospitalization; patient was advanced to regular solids, thin liquids with precautions of small sips/bites Diet Prior to this Study: NPO Temperature Spikes Noted: No Respiratory Status: Room air History of Recent Intubation: No Behavior/Cognition: Cooperative;Pleasant mood;Lethargic/Drowsy Oral Cavity Assessment: Dry Oral Care Completed by SLP: Yes Oral Cavity - Dentition: Adequate natural dentition Self-Feeding Abilities: Total assist Patient Positioning: Upright in bed Baseline Vocal Quality: Low vocal intensity Volitional Cough: Strong Volitional Swallow: Unable to elicit    Oral/Motor/Sensory Function Overall Oral Motor/Sensory Function: Generalized oral weakness Facial ROM: Within Functional Limits Facial Symmetry: Within Functional Limits Lingual ROM: Within Functional Limits   Ice Chips Ice chips: Impaired Oral Phase Impairments: Impaired mastication Oral Phase Functional Implications: Prolonged oral transit   Thin Liquid Thin Liquid: Impaired Presentation: Straw Pharyngeal  Phase Impairments: Suspected delayed Swallow    Nectar Thick     Honey Thick     Puree Puree: Not tested   Solid     Solid: Not tested     Sonia Baller, MA, CCC-SLP Speech Therapy

## 2021-09-01 NOTE — Progress Notes (Addendum)
Pharmacy Antibiotic Note  Anthony Hayes is a 86 y.o. male admitted on 08/31/2021 with sepsis.  In the ED, patient received Vancomycin 1588m IV, Cefepime 2gm IV and Metronidazole 5018mIV x 1 dose each.  Upon admission, Pharmacy has been consulted for Cefepime and vancomycin dosing.  Of note, Scr has bumped up from 0.80 to 1.33 over night   Plan: Vancomycin 150059m 1 in ED  Will check VR with AM labs to help guide further dosing  Adjust Cefepime to 2gm IV q12h Metronidazole 500 mg IV q12h     Height: 5' 6"  (167.6 cm) Weight: 60.4 kg (133 lb 2.5 oz) IBW/kg (Calculated) : 63.8  Temp (24hrs), Avg:99 F (37.2 C), Min:97.8 F (36.6 C), Max:101.1 F (38.4 C)  Recent Labs  Lab 08/31/21 2300 09/01/21 0415 09/01/21 0547 09/01/21 0556 09/01/21 0706  WBC 15.7*  --   --   --  28.6*  CREATININE 0.80  --   --  1.33*  --   LATICACIDVEN  --  3.2* 2.5*  --   --      Estimated Creatinine Clearance: 35.3 mL/min (A) (by C-G formula based on SCr of 1.33 mg/dL (H)).    Allergies  Allergen Reactions   Lisinopril Swelling    Swelling of lips     Antimicrobials this admission:  9/4 Flagyl >>  9/5 Vancomycin >>  9/5 Cefepime >>  Dose adjustments this admission:     Microbiology results:  9/4 BCx:   9/5 UCx:   9/4 resp panel: negative  9/5 MRSA PCR: negative   Thank you for allowing pharmacy to be a part of this patient's care.  NikRoyetta AsalharmD, BCPS 09/01/2021 9:55 AM

## 2021-09-01 NOTE — H&P (Signed)
TRH H&P    Patient Demographics:    Anthony Hayes, is a 86 y.o. male  MRN: [id number]  DOB - 09-21-1935  Admit Date - 08/31/2021  Referring MD/NP/PA: Sedonia Small  Outpatient Primary MD for the patient is Etter Sjogren, DO  Patient coming from: [facility]  Chief complaint- Hematemesis    HPI:    Anthony Hayes  is a 86 y.o. male, with history of arthritis, BPH, dementia, GERD, hyperlipidemia, hypertension, hypothyroidism, renal disease, esophageal tear, on hospice since he had COVID in 2020 per family, presents the ED with a chief complaint of hematochezia and hematemesis. Patient unfortunately has advanced dementia and is not able to provide any history.Chart review reveals that [facility] facility noted bloody emesis and bloody diarrhea as well as declining mental status since breakfast.  Family at bedside reports that they did not hear about any hematochezia, just that hematemesis and possibly hematuria.  Patient is on Plavix.  Patient had presented to the ED 3 days ago.  At that time he had had a fall and syncope.  His hemoglobin at that time was in the 13's, today is 7.3.  He has a diagnosis of hypertension, but today his blood pressure upon arrival was 82/43.  He was worked up for sepsis.  He has a white blood cell count of 15.7, and he had a fever of 101.1.  So far no source has been found.  UA is still pending.  Chest x-ray showed no active disease.  CT head showed progressive frontal and left preseptal soft tissue swelling from where he hit his head and this fall on September 2.  Patient was started on vancomycin, cefepime, Flagyl.  Lactic acid is pending.  Patient was given 2 L of LR, and remained hypotensive.  ED provider spoke with the family regarding goals of care.  They initially said not to do comfort care, but not to escalate care.  They currently do not want a central line or blood  transfusion.  They do not want pressors.  At the time that I saw the patient and they were explaining that they want a little more work-up before they make a decision to go for full treatment or for full comfort care.  Family is concerned because 4 years ago patient was vomiting blood and CT scan showed an esophageal tear.  They were advised to come in and say their goodbyes.  GI was consulted and an EGD, dilated the esophagus had a stricture, and according to family they repaired the esophageal tear.  Patient healed and is still with [place] now 4 years later.  They are concerned that there is a "simple fix" like that, and they do not want to withdraw care until they know the source of the bleeding.  I advised him that we might not know the source of bleeding regardless of more work-up.  At this time they would like the CT chest abdomen pelvis.  As far as infection-family reports that patient was recently diagnosed with a tooth infection 1  week ago.  He was not put on antibiotics.  Lastly family is concerned to know if he is in rhabdomyolysis.  As he has a history of rhabdo 5 years ago.  CPK has been ordered.  Patient is DNR.  In the ED Temp 101.1, heart rate 71-87 (family reports that his baseline heart rate at every hospitalization he has been to is 40 bpm, so they are concerned that this higher heart rate for him), respiratory rate 17-27, blood pressure as low as 82/43, after 2 L bolus, 95/54, satting at 95% Leukocytosis at 15.7, hemoglobin 7.3, platelets 137 Albumin 1.8, calcium 7.4 Bicarb is low at 16 CT head shows no acute intracranial abnormality Negative respiratory panel Chest x-ray shows no active disease EKG is without acute ischemic changes Cefepime, vancomycin, Flagyl 2.25 LR bolus LR 150 mils per hour Beginning discussions regarding goals of care   Review of systems:    Unfortunately review of systems could not be reviewed secondary to patient's altered mental status    Past History  of the following :    Past Medical History:  Diagnosis Date   Arthritis    "joints" (10/15/2017)   Benign prostatic hyperplasia    Complication of anesthesia    "last OR in 06/2017 affected him cognitively; hasn't got back to baseline yet" (10/15/2017)   Dementia ([place])    Depression    GERD (gastroesophageal reflux disease)    High cholesterol    Hypertension    Hypothyroidism    Renal disease    Renal disorder    TIA (transient ischemic attack) early 2000s      Past Surgical History:  Procedure Laterality Date   ABDOMINAL EXPLORATION SURGERY  7/   APPENDECTOMY     CATARACT EXTRACTION W/ INTRAOCULAR LENS  IMPLANT, BILATERAL Bilateral    COLECTOMY  1950s   "thought he had iteilis"   ESOPHAGOGASTRODUODENOSCOPY N/A 10/22/2017   Procedure: ESOPHAGOGASTRODUODENOSCOPY (EGD);  Surgeon: Gatha Mayer, MD;  Location: [facility] ENDOSCOPY;  Service: Endoscopy;  Laterality: N/A;   ESOPHAGOGASTRODUODENOSCOPY (EGD) WITH PROPOFOL N/A 10/16/2017   Procedure: ESOPHAGOGASTRODUODENOSCOPY (EGD) WITH PROPOFOL;  Surgeon: Milus Banister, MD;  Location: [facility] ENDOSCOPY;  Service: Endoscopy;  Laterality: N/A;   FEMUR FRACTURE SURGERY Right 1950s   FRACTURE SURGERY     KNEE ARTHROSCOPY Left    POSTERIOR LUMBAR FUSION     ROTATOR CUFF REPAIR Left    TONSILLECTOMY        Social History:      Social History   Tobacco Use   Smoking status: Former    Years: 7.00    Types: Cigarettes    Quit date: 1963    Years since quitting: 59.7   Smokeless tobacco: Never  Substance Use Topics   Alcohol use: Yes    Comment: 10/15/2017 "a few drinks/year"       Family History :     Family History  Problem Relation Age of Onset   Hypertension Father       Home Medications:   Prior to Admission medications   Medication Sig Start Date End Date Taking? Authorizing Provider  amLODipine (NORVASC) 5 MG tablet Take 1 tablet (5 mg total) by mouth daily. 11/26/18   Caren Griffins, MD  ascorbic acid  (VITAMIN C) 500 MG tablet Take 1 tablet (500 mg total) by mouth daily. 01/19/20   Lucky Cowboy, MD  Cholecalciferol (VITAMIN D3) 50 MCG (2000 UT) capsule Take 2,000 Units by mouth daily.  [provider]  clopidogrel (PLAVIX) 75 MG tablet Take 1 tablet (75 mg total) by mouth daily. 11/29/18   Minus Breeding, MD  cyanocobalamin (,VITAMIN B-12,) 1000 MCG/ML injection Inject 1,000 mcg into the muscle every 30 (thirty) days.    [provider]  levothyroxine (SYNTHROID) 50 MCG tablet Take 50 mcg by mouth daily before breakfast.    [provider]  Melatonin 3 MG SUBL Place 6 mg under the tongue at bedtime.    [provider]  pantoprazole (PROTONIX) 40 MG tablet Take 1 tablet (40 mg total) by mouth daily. 04/05/18   Dhungel, Flonnie Overman, MD  pravastatin (PRAVACHOL) 10 MG tablet Take 1 tablet (10 mg total) by mouth daily. Patient taking differently: Take 10 mg by mouth at bedtime.  06/06/18   Frann Rider, NP  sertraline (ZOLOFT) 100 MG tablet Take 100 mg by mouth daily. Take with Sertraline 25 mg to equal 125 mg 08/17/18   [provider]  sertraline (ZOLOFT) 25 MG tablet Take 25 mg by mouth daily. Take with Sertraline 100 mg to equal 125 mg    [provider]  zinc sulfate 220 (50 Zn) MG capsule Take 1 capsule (220 mg total) by mouth daily. 01/19/20   Lucky Cowboy, MD     Allergies:     Allergies  Allergen Reactions   Lisinopril Swelling    Swelling of lips      Physical Exam:   Vitals  Blood pressure (!) 88/71, pulse 80, temperature (!) 101.1 F (38.4 C), temperature source Rectal, resp. rate (!) 21, height 5' 6"  (1.676 m), weight 66 kg, SpO2 94 %.  1.  General: Patient lying supine in bed, ill-appearing   2. Psychiatric: Alert and with incomprehensible speech   3. Neurologic: Incomprehensible speech, face is symmetric, not following commands  4. HEENMT:  Head with evidence of recent trauma from fall, normocephalic, pupils  reactive to light, neck is cachectic, trachea is midline, mucous membranes are dry   5. Respiratory : Lungs are clear to auscultation bilaterally without wheezing, rhonchi, rales, no cyanosis, no increase in work of breathing or accessory muscle use   6. Cardiovascular : Heart rate normal, rhythm is regular, no murmurs, rubs or gallops, no peripheral edema, peripheral pulses palpated   7. Gastrointestinal:  Abdomen is soft, nondistended, nontender to palpation bowel sounds active, no masses or organomegaly palpated   8. Skin:  Skin is warm, dry and intact without rashes, significant bruising left hand, no ulcers on limited exam   9.Musculoskeletal:  No acute deformities, no asymmetry in tone, no peripheral edema, peripheral pulses palpated, no tenderness to palpation in the extremities     Data Review:    CBC Recent Labs  Lab 08/31/21 2300  WBC 15.7*  HGB 7.3*  HCT 22.6*  PLT 137*  MCV 96.6  MCH 31.2  MCHC 32.3  RDW 14.0  LYMPHSABS 0.2*  MONOABS 0.3  EOSABS 0.0  BASOSABS 0.0   ------------------------------------------------------------------------------------------------------------------  Results for orders placed or performed during the hospital encounter of 08/31/21 (from the past 48 hour(s))  POC occult blood, ED     Status: None   Collection Time: 08/31/21  9:30 PM  Result Value Ref Range   Fecal Occult Bld NEGATIVE NEGATIVE  Type and screen [place]     Status: None   Collection Time: 08/31/21  9:32 PM  Result Value Ref Range   ABO/RH(D) O POS    Antibody Screen NEG  Sample Expiration      09/03/2021,2359 Performed at [facility], [place] [address], [place], [place] [address]   Resp Panel by RT-PCR (Flu A&B, Covid) Nasopharyngeal Swab     Status: None   Collection Time: 08/31/21  9:53 PM   Specimen: Nasopharyngeal Swab; Nasopharyngeal(NP) swabs in vial transport medium  Result Value Ref Range   SARS Coronavirus  2 by RT PCR NEGATIVE NEGATIVE    Comment: (NOTE) SARS-CoV-2 target nucleic acids are NOT DETECTED.  The SARS-CoV-2 RNA is generally detectable in upper respiratory specimens during the acute phase of infection. The lowest concentration of SARS-CoV-2 viral copies this assay can detect is 138 copies/mL. A negative result does not preclude SARS-Cov-2 infection and should not be used as the sole basis for treatment or other patient management decisions. A negative result may occur with  improper specimen collection/handling, submission of specimen other than nasopharyngeal swab, presence of viral mutation(s) within the areas targeted by this assay, and inadequate number of viral copies(<138 copies/mL). A negative result must be combined with clinical observations, patient history, and epidemiological information. The expected result is Negative.  Fact Sheet for Patients:  EntrepreneurPulse.com.au  Fact Sheet for Healthcare Providers:  IncredibleEmployment.be  This test is no t yet approved or cleared by the [place] FDA and  has been authorized for detection and/or diagnosis of SARS-CoV-2 by FDA under an Emergency Use Authorization (EUA). This EUA will remain  in effect (meaning this test can be used) for the duration of the COVID-19 declaration under Section 564(b)(1) of the Act, 21 U.S.C.section 360bbb-3(b)(1), unless the authorization is terminated  or revoked sooner.       Influenza A by PCR NEGATIVE NEGATIVE   Influenza B by PCR NEGATIVE NEGATIVE    Comment: (NOTE) The Xpert Xpress SARS-CoV-2/FLU/RSV plus assay is intended as an aid in the diagnosis of influenza from Nasopharyngeal swab specimens and should not be used as a sole basis for treatment. Nasal washings and aspirates are unacceptable for Xpert Xpress SARS-CoV-2/FLU/RSV testing.  Fact Sheet for Patients: EntrepreneurPulse.com.au  Fact Sheet for Healthcare  Providers: IncredibleEmployment.be  This test is not yet approved or cleared by the [place] FDA and has been authorized for detection and/or diagnosis of SARS-CoV-2 by FDA under an Emergency Use Authorization (EUA). This EUA will remain in effect (meaning this test can be used) for the duration of the COVID-19 declaration under Section 564(b)(1) of the Act, 21 U.S.C. section 360bbb-3(b)(1), unless the authorization is terminated or revoked.  Performed at [facility], [place] [address], [place], [place] [address]   Comprehensive metabolic panel     Status: Abnormal   Collection Time: 08/31/21 11:00 PM  Result Value Ref Range   Sodium 134 (L) 135 - 145 mmol/L   Potassium 3.6 3.5 - 5.1 mmol/L   Chloride 106 98 - 111 mmol/L   CO2 16 (L) 22 - 32 mmol/L   Glucose, Bld 67 (L) 70 - 99 mg/dL    Comment: Glucose reference range applies only to samples taken after fasting for at least 8 hours.   BUN 21 8 - 23 mg/dL   Creatinine, Ser 0.80 0.61 - 1.24 mg/dL   Calcium 7.4 (L) 8.9 - 10.3 mg/dL   Total Protein 3.6 (L) 6.5 - 8.1 g/dL   Albumin 1.8 (L) 3.5 - 5.0 g/dL   AST 30 15 - 41 U/L   ALT 15 0 - 44 U/L   Alkaline Phosphatase 70 38 - 126 U/L  Total Bilirubin 0.5 0.3 - 1.2 mg/dL   GFR, Estimated >60 >60 mL/min    Comment: (NOTE) Calculated using the CKD-EPI Creatinine Equation (2021)    Anion gap 12 5 - 15    Comment: Performed at [facility], [place] [address], [place], [place] [address]  CBC WITH DIFFERENTIAL     Status: Abnormal   Collection Time: 08/31/21 11:00 PM  Result Value Ref Range   WBC 15.7 (H) 4.0 - 10.5 K/uL   RBC 2.34 (L) 4.22 - 5.81 MIL/uL   Hemoglobin 7.3 (L) 13.0 - 17.0 g/dL   HCT 22.6 (L) 39.0 - 52.0 %   MCV 96.6 80.0 - 100.0 fL   MCH 31.2 26.0 - 34.0 pg   MCHC 32.3 30.0 - 36.0 g/dL   RDW 14.0 11.5 - 15.5 %   Platelets 137 (L) 150 - 400 K/uL   nRBC 0.0 0.0 - 0.2 %   Neutrophils Relative % 96 %   Neutro  Abs 15.0 (H) 1.7 - 7.7 K/uL   Lymphocytes Relative 1 %   Lymphs Abs 0.2 (L) 0.7 - 4.0 K/uL   Monocytes Relative 2 %   Monocytes Absolute 0.3 0.1 - 1.0 K/uL   Eosinophils Relative 0 %   Eosinophils Absolute 0.0 0.0 - 0.5 K/uL   Basophils Relative 0 %   Basophils Absolute 0.0 0.0 - 0.1 K/uL   WBC Morphology MILD LEFT SHIFT (1-5% METAS, OCC MYELO, OCC BANDS)     Comment: TOXIC GRANULATION   Immature Granulocytes 1 %   Abs Immature Granulocytes 0.17 (H) 0.00 - 0.07 K/uL    Comment: Performed at [facility], [place] [address] [place], [place] [address]  Protime-INR     Status: Abnormal   Collection Time: 08/31/21 11:00 PM  Result Value Ref Range   Prothrombin Time 19.2 (H) 11.4 - 15.2 seconds   INR 1.6 (H) 0.8 - 1.2    Comment: (NOTE) INR goal varies based on device and disease states. Performed at [facility], [place] [address] [place], [place] [address]   APTT     Status: Abnormal   Collection Time: 08/31/21 11:00 PM  Result Value Ref Range   aPTT 37 (H) 24 - 36 seconds    Comment:        IF BASELINE aPTT IS ELEVATED, SUGGEST PATIENT RISK ASSESSMENT BE USED TO DETERMINE APPROPRIATE ANTICOAGULANT THERAPY. Performed at [facility], [place] [address], [place], [place] [address]     Chemistries  Recent Labs  Lab 08/31/21 2300  NA 134*  K 3.6  CL 106  CO2 16*  GLUCOSE 67*  BUN 21  CREATININE 0.80  CALCIUM 7.4*  AST 30  ALT 15  ALKPHOS 70  BILITOT 0.5   ------------------------------------------------------------------------------------------------------------------  ------------------------------------------------------------------------------------------------------------------ GFR: Estimated Creatinine Clearance: 62 mL/min (by C-G formula based on SCr of 0.8 mg/dL). Liver Function Tests: Recent Labs  Lab 08/31/21 2300  AST 30  ALT 15  ALKPHOS 70  BILITOT 0.5  PROT 3.6*  ALBUMIN 1.8*   No results for  input(s): LIPASE, AMYLASE in the last 168 hours. No results for input(s): AMMONIA in the last 168 hours. Coagulation Profile: Recent Labs  Lab 08/31/21 2300  INR 1.6*   Cardiac Enzymes: No results for input(s): CKTOTAL, CKMB, CKMBINDEX, TROPONINI in the last 168 hours. BNP (last 3 results) No results for input(s): PROBNP in the last 8760 hours. HbA1C: No results for input(s): HGBA1C in the last 72 hours. CBG:  No results for input(s): GLUCAP in the last 168 hours. Lipid Profile: No results for input(s): CHOL, HDL, LDLCALC, TRIG, CHOLHDL, LDLDIRECT in the last 72 hours. Thyroid Function Tests: No results for input(s): TSH, T4TOTAL, FREET4, T3FREE, THYROIDAB in the last 72 hours. Anemia Panel: No results for input(s): VITAMINB12, FOLATE, FERRITIN, TIBC, IRON, RETICCTPCT in the last 72 hours.  --------------------------------------------------------------------------------------------------------------- Urine analysis:    Component Value Date/Time   COLORURINE YELLOW 02/28/2020 2330   APPEARANCEUR CLEAR 02/28/2020 2330   LABSPEC 1.021 02/28/2020 2330   PHURINE 5.0 02/28/2020 2330   GLUCOSEU NEGATIVE 02/28/2020 2330   HGBUR NEGATIVE 02/28/2020 2330   BILIRUBINUR NEGATIVE 02/28/2020 2330   BILIRUBINUR NEG 01/21/2018 2022   KETONESUR NEGATIVE 02/28/2020 2330   PROTEINUR NEGATIVE 02/28/2020 2330   UROBILINOGEN 1.0 01/21/2018 2022   NITRITE NEGATIVE 02/28/2020 2330   LEUKOCYTESUR NEGATIVE 02/28/2020 2330      Imaging Results:    DG Forearm Left  Result Date: 08/31/2021 CLINICAL DATA:  Left arm bruising, dementia EXAM: LEFT FOREARM - 2 VIEW COMPARISON:  None. FINDINGS: There is no evidence of fracture or other focal bone lesions. Soft tissues are unremarkable. IMPRESSION: Negative. Electronically Signed   By: Fidela Salisbury M.D.   On: 08/31/2021 20:41   CT HEAD WO CONTRAST (5MM)  Result Date: 08/31/2021 CLINICAL DATA:  Mental status change, unknown cause. Left forehead injury,  scalp soft tissue swelling, dementia. EXAM: CT HEAD WITHOUT CONTRAST TECHNIQUE: Contiguous axial images were obtained from the base of the skull through the vertex without intravenous contrast. COMPARISON:  08/29/2021, 11/22/2018 FINDINGS: Brain: Normal anatomic configuration of the brain. Moderate parenchymal volume loss is commensurate with the patient's age and stable since immediate prior examination, though appears slightly progressive since remote prior exam. Extensive bilateral subcortical and periventricular white matter changes are again identified likely reflecting the sequela of small vessel ischemia. Mild ventriculomegaly is stable, commensurate with the degree of parenchymal volume loss and likely reflecting the sequela of central atrophy. Multiple remote lacunar infarcts are noted within the basal ganglia bilaterally, the thalami bilaterally, and left cerebellar hemisphere. No acute intracranial hemorrhage or infarct. No abnormal mass effect or midline shift. No abnormal intra or extra-axial mass lesion. Vascular: No asymmetric hyperdense vasculature at the skull base. Skull: Intact Sinuses/Orbits: Paranasal sinuses are clear. Orbits are unremarkable. Other: Mastoid air cells and middle ear cavities are clear. There is increasing frontal scalp soft tissue swelling as well as left preseptal soft tissue swelling. Skin staples are seen anteriorly. IMPRESSION: Progressive frontal and left preseptal soft tissue swelling. No acute intracranial injury. No calvarial fracture. Advanced senescent change, progressive since remote prior examination. Multiple bilateral remote lacunar infarcts as outlined above. Electronically Signed   By: Fidela Salisbury M.D.   On: 08/31/2021 22:24   DG Chest Port 1 View  Result Date: 08/31/2021 CLINICAL DATA:  Sepsis, altered mental status, hematemesis EXAM: PORTABLE CHEST 1 VIEW COMPARISON:  08/29/2021 FINDINGS: Lungs are clear. No pneumothorax or pleural effusion. Mild  cardiomegaly is stable. Pulmonary vascularity is normal. No acute bone abnormality. IMPRESSION: No active disease.  Stable cardiomegaly. Electronically Signed   By: Fidela Salisbury M.D.   On: 08/31/2021 22:18   CT CHEST ABDOMEN PELVIS WO CONTRAST  Result Date: 09/01/2021 CLINICAL DATA:  Bloody emesis and diarrhea. EXAM: CT CHEST, ABDOMEN AND PELVIS WITHOUT CONTRAST TECHNIQUE: Multidetector CT imaging of the chest, abdomen and pelvis was performed following the standard protocol without IV contrast. COMPARISON:  Chest CT 01/14/2020 FINDINGS: CT CHEST FINDINGS Cardiovascular:  Normal heart size. No pericardial effusion. Aortic and coronary atherosclerosis. Mediastinum/Nodes: Negative for hematoma or pneumomediastinum. Small sliding hiatal hernia. No esophageal thickening or periesophageal inflammation seen. Lungs/Pleura: Dependent atelectasis. No focal opacity to suggest pneumonia or alveolar hemorrhage. Musculoskeletal: No chest wall mass or suspicious bone lesions identified. CT ABDOMEN PELVIS FINDINGS Hepatobiliary: No hepatic injury or perihepatic hematoma. Hazy high-density in the gallbladder without discrete calcified stone, usually sludge. Pancreas: No acute finding. Punctate calcification at the pancreatic head with no CBD dilatation or biliary labs derangement, best attributed to parenchymal calcification next to the CBD. Spleen: Normal in size without focal abnormality. Adrenals/Urinary Tract: Negative adrenals. Small high-density nodule in the upper pole right kidney consistent with hemorrhagic cyst. Thick walled bladder with perivesicular fat stranding Stomach/Bowel: Appendectomy. No fluid distended stomach, small-bowel obstruction, or inflammatory bowel wall thickening. No distal colonic fluid levels to correlate with diarrhea history. Vascular/Lymphatic: Atheromatous plaque, expected. Reproductive: Suspect prior trans urethral prostate resection Other: No ascites or pneumoperitoneum. 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 IMPRESSION: 1. No visible source of GI bleeding. 2. Thick walled bladder with perivesicular stranding, please correlate for cystitis. 3. Chronic/senescent changes described above. Electronically Signed   By: Monte Fantasia M.D.   On: 09/01/2021 04:15       Assessment & Plan:    Active Problems:   GI bleed   GI bleed With reported hematemesis and hematochezia Hemoglobin dropped from 13-7.3 Hold Plavix I have confirmed with family no blood at this time They would like to pursue imaging to try to identify source of bleeding -advised that this may not give them the answer Protonix twice daily IV N.p.o. Family will decide if they want to consult GI -we discussed nonaggressive treatments including no invasive procedures.  I have advised them that GI would only be on board to do an invasive procedure, so the consult should not be done unless they are wanting to move towards full care Repeat CBC pending Continue to monitor Blood loss anemia Secondary to above See plan above End of life discussion  Quality of life is poor with patient only recognizing daughters (per daughter report), remaining mostly nonverbal and not able to participate in normal conversation, no control of bowels or bladder, and living in a skilled nursing facility Daughters understand that patient made the decision to be DNR so they will continue with at Daughter is concerned that when the mother was dying their father fought for her every day, significantly they are not doing him just this is a do not fight for him every day Patient is already in hospice Family cannot come to decision regarding care or comfort measures, palliative care consult may be beneficial Acute metabolic encephalopathy Patient with advanced dementia -baseline is alert, recognizing his daughters, but mostly nonverbal Skilled nursing facility reported declining mentation  throughout the day-no specifics on chart review Likely secondary to anemia, also possible infection/sepsis Patient did have a head injury 3 days ago, CT scan at that time, and CT repeat CT scan today showed no acute intracranial abnormalities Continue to monitor Hypoalbuminemia Decrease in albumin from 3.83 days ago to 1.8 today Anticipate peripheral edema with thin amount of fluid to has been getting Discussed possible albumin infusion later today-family will be thinking about that so that if it is necessary they are ready to make a decision Hypotension/SIRS Ruling out sepsis Blood pressure as low as 82, white blood cell count 15.7, temperature 101.1 No infection identified as of yet  UA, CT scans, lactic acid, procalcitonin pending Patient did receive 2.25 L LR in the ED Patient still appears dry, another liter has been ordered Then continue LR 150 mils per hour Possible tooth infection per family? Acute blood loss anemia could also be causing the hypotension, stress reaction with leukocytosis Continue broad-spectrum antibiotics until further data is obtained Metabolic acidosis with normal anion gap Anion gap is 12 Bicarb 16 Normal renal function Lactic acidosis is a concern in the setting of hypotension pH/VBG pending Lactic acid pending Repeat labs pending, if worsening consider amp of bicarb    DVT Prophylaxis-     SCDs   AM Labs Ordered, also please review Full Orders  Family Communication: Admission, patients condition and plan of care including tests being ordered have been discussed with the patient and daughters who indicate understanding and agree with the plan and Code Status.  Code Status: DNR  Admission status: Inpatient :The appropriate admission status for this patient is INPATIENT. Inpatient status is judged to be reasonable and necessary in order to provide the required intensity of service to ensure the patient's safety. The patient's presenting symptoms,  physical exam findings, and initial radiographic and laboratory data in the context of their chronic comorbidities is felt to place them at high risk for further clinical deterioration. Furthermore, it is not anticipated that the patient will be medically stable for discharge from the hospital within 2 midnights of admission. The following factors support the admission status of inpatient.     The patient's presenting symptoms include hematochezia and hematemesis. The worrisome physical exam findings include ill-appearing, dehydration, fever The initial radiographic and laboratory data are worrisome because of significant drop of hemoglobin, decreased bicarb, leukocytosis,  The chronic co-morbidities include advanced dementia, GERD, hyperlipidemia, hypertension, hypothyroidism.       * I certify that at the point of admission it is my clinical judgment that the patient will require inpatient hospital care spanning beyond 2 midnights from the point of admission due to high intensity of service, high risk for further deterioration and high frequency of surveillance required.*  Time spent in minutes : [place]

## 2021-09-01 NOTE — Progress Notes (Signed)
[place] [id number] - [facility]) hospitalized hospice patient.   This is a current hospice patient with a terminal diagnosis of cerebral atherosclerosis. Patient was transported from [place] [facility] to [place] ED for evaluation of altered mental status, bloody emesis and diarrhea. Family contacted hospice prior to this transfer. Patient was admitted to [facility] on 09/01/2021 with a diagnosis of sepsis and GI bleed. Per Dr. Orpah Melter with [facility], this is a related hospital admission.   Visited at bedside, both daughters present. Patient was resting in bed, awake and alert, non verbal same as at baseline. No apparent distress. Patient is currently NPO pending results of Esophagram to rule out esophageal tear.   This patient remains inpatient appropriate due to the need of IV fluids and antibiotics.   VS: 97.9, 92/70, 63, 18, 95%RA I/O: not documented  Abnormal Labs: 09/01/2021 05:56 CO2: 20 (L) BUN: 29 (H) Creatinine: 1.33 (H) Calcium: 8.6 (L) Albumin: 2.8 (L) Total Protein: 5.7 (L) GFR, Estimated: 53 (L) Lactic Acid, Venous: 2.5 (HH) WBC: 28.6 (H) RBC: 3.07 (L) Hemoglobin: 9.6 (L) HCT: 27.5 (L) Prothrombin Time: 15.4 (H) Cortisol - AM: 41.1 (H) Epic Imaging: CT head IMPRESSION: Progressive frontal and left preseptal soft tissue swelling. No acute intracranial injury. No calvarial fracture. Advanced senescent change, progressive since remote prior examination. Multiple bilateral remote lacunar infarcts as outlined above.  IV/PRN Meds: Maxipime 2g IVPB BID, Flagyl 561m IVPB BID, LR 1511mhr, no PRNs   Problem List: -Acute blood loss anemia due to upper GI bleed-presents with hematemesis. ---Hgb improved to 9.6  -SIRS/hypotension/lactic acidosis-no clear source of infection yet.  Recent history of dental infection. ---Continue broad-spectrum antibiotics with vancomycin, cefepime and Flagyl -Acute metabolic encephalopathy in patient with advanced  dementia-multifactorial.  -AKI/azotemia -Metabolic acidosis-likely due to lactic acidosis and dehydration. -Coagulopathy: Resolved. -Debility/fall at nursing home-patient had a fall with head injury 3 days prior to presentation.  Had forehead laceration that has been repaired with staples  Discharge planning: Return to memory care when stable for discharge is appropriate.  Family Contact: Daughters at bedside during visit  IDG; Updated  GOC: Clear. DNR. Treat the infection but no aggressive interventions such as blood transfusions or colonoscopy/endoscopy.   Medication list and transfer summary placed on shadow chart.   Should patient need ambulance transport at discharge please use [facility].  Please do not hesitate to call with questions.   Thank you,   MaFarrel GordonRN, [facility] Hospitaliaison   [phone]

## 2021-09-01 NOTE — Sepsis Progress Note (Signed)
ED RN states 2nd IV site placed with 2nd set of BC obtained along with 1st lactic acid

## 2021-09-01 NOTE — Progress Notes (Signed)
PROGRESS NOTE  Anthony Hayes MLJ:[id number] DOB: 1935/10/26   PCP: Etter Sjogren, DO  Patient is from: [facility]  DOA: 08/31/2021 LOS: 0  Chief complaints:  Chief Complaint  Patient presents with   Altered Mental Status     Brief Narrative / Interim history: 86 year old M with history of dementia on hospice, HTN, hypothyroidism, BPH, GERD, esophageal tear, and osteoarthritis brought to ED by EMS with concern for hematemesis, hematochezia and mental status change, and admitted for ABLA from acute GI bleed, SIRS and hypotension.  He was febrile with leukocytosis, hypotension and lactic acidosis.  Hgb 7.3 (baseline 13 about 3 days prior).  CXR and CT head without significant finding other than frontal and left preseptal soft tissue swelling.  CT abdomen and pelvis raises concern for cystitis.  UA with moderate Hgb, LE and rare bacteria.  Cultures obtained.  Patient was started on IV fluid and broad-spectrum antibiotics, and admitted.  Per admitting provider, family not interested in blood transfusion or invasive procedures such as endoscopy or colonoscopy as of now.   Subjective: Seen and examined earlier this morning.  No major events overnight of this morning.  He is awake but only oriented to self.  He responds no to pain, shortness of breath or abdominal pain but not a reliable historian.  Barely follows command.  Objective: Vitals:   09/01/21 0700 09/01/21 0800 09/01/21 0900 09/01/21 1000  BP: (!) 88/54 (!) 85/46 (!) 91/47 92/70  Pulse: 73 67 63 63  Resp: (!) 23 19 14 18   Temp:  97.8 F (36.6 C)    TempSrc:  Oral    SpO2: 96% 94% 96% 95%  Weight:      Height:        Intake/Output Summary (Last 24 hours) at 09/01/2021 1110 Last data filed at 09/01/2021 0238 Gross per 24 hour  Intake 2500 ml  Output --  Net 2500 ml   Filed Weights   08/31/21 2009 09/01/21 0600  Weight: 66 kg 60.4 kg    Examination:  GENERAL: Frail looking elderly male.  No apparent  distress. HEENT: MMM.  Vision and hearing grossly intact.  Laceration above frontal head.  6 staples in place.  Slight bruising around left eye. NECK: Supple.  No apparent JVD.  RESP: On RA.  No IWOB.  Fair aeration bilaterally. CVS:  RRR. Heart sounds normal.  ABD/GI/GU: BS+. Abd soft, NTND.  MSK/EXT:  Moves extremities. No apparent deformity. No edema.  Significant muscle mass and subcu fat loss. SKIN: no apparent skin lesion or wound NEURO: Awake.  Oriented to self.  Barely follows command.  No apparent focal neuro deficit but limited exam. PSYCH: Calm. Normal affect.   Procedures:  None  Microbiology summarized: [id number] and influenza PCR nonreactive. MRSA PCR screen nonreactive. Urine culture pending Blood culture pending  Assessment & Plan: Acute blood loss anemia due to upper GI bleed-presents with hematemesis.  History of GERD and esophageal ulcer in the past.  Fecal Hemoccult negative.  Patient is on Plavix likely for TIA.  CT abdomen and pelvis without significant finding.  Family not interested in blood transfusion or invasive intervention such as endoscopy/colonoscopy as of now.  Fortunately, Hgb improved to 9.6 this morning.  Blood pressure improved. Recent Labs    11/16/20 1239 08/31/21 2300 09/01/21 0706  HGB 12.2* 7.3* 9.6*  -We will check anemia panel in the morning -Monitor H&H -Check anemia panel -Continue IV Protonix 40 mg twice daily -Esophagram with water soluble contrast. -  N.p.o. except ice chips -Continue IV fluid for hydration -Further plan based on goal of care discussion -We will discontinue Plavix indefinitely  SIRS/hypotension/lactic acidosis-no clear source of infection yet.  Recent history of dental infection.  He has fever, leukocytosis, hypotension and lactic acidosis.  Still with soft blood pressure but improved.  No respiratory symptoms.  CT abdomen and pelvis unrevealing.  UA not convincing for UTI.  A.m. cortisol appropriately high.   -Continue broad-spectrum antibiotics with vancomycin, cefepime and Flagyl for now -Follow blood and urine cultures -Trend lactic acidosis-improving. -Trend leukocytosis-uptrending. -Continue IV fluid and stress dose steroid -Continue holding home antihypertensive meds  Acute metabolic encephalopathy in patient with advanced dementia-multifactorial.  CT head without significant finding other than frontal soft tissue swelling from recent fall and trauma.  Has some signs of infection but no meningeal sign.  No apparent focal neurodeficit to suggest CVA.  He seems to take Ativan and Compazine at facility.  He is awake and oriented to self. -Check basic encephalopathy labs -Treat treatable causes as above -Aspiration, fall and delirium precautions -Minimize or avoid sedating medications  AKI/azotemia: Likely a combination of prerenal from hypotension and possible ATN from infectious process.  CT did not show obstructive etiology Recent Labs    11/16/20 1239 08/31/21 2300 09/01/21 0556  BUN 26* 21 29*  CREATININE 0.97 0.80 1.33*  -Monitor urine output -Continue IV fluid -Avoid nephrotoxic meds -Continue monitoring -Further work-up if no improvement  Metabolic acidosis-likely due to lactic acidosis and dehydration.  Likely from lactic acidosis and AKI.  Improving. -Continue monitoring  Coagulopathy: Resolved.  Leukocytosis/bandemia-suspect infectious source.  Is uptrending. -Continue broad-spectrum antibiotics  Hypothyroidism -Continue home Synthroid  Debility/fall at nursing home-patient had a fall with head injury 3 days prior to presentation.  Had forehead laceration that has been repaired with staples. -Staple removal in 4 to 5 days -Fall precautions -PT/OT eval if family is interested since he is enrolled with home hospice  Goal of care counseling-patient with advanced dementia and comorbidity as above.  He is enrolled with home hospice for about 2 years now.  He is  appropriately DNR/DNI.  Family not interested in blood transfusion, ACLS meds and invasive procedures such as EGD and colonoscopy. -Further goal of care discussion based on clinical progress   Body mass index is 21.49 kg/m.        DVT prophylaxis:  SCDs Start: 09/01/21 0547  Code Status: DNR/DNI Family Communication: Updated patient's daughters at bedside. Level of care: Stepdown Status is: Inpatient  Remains inpatient appropriate because:Hemodynamically unstable, Altered mental status, Ongoing diagnostic testing needed not appropriate for outpatient work up, IV treatments appropriate due to intensity of illness or inability to take PO, and Inpatient level of care appropriate due to severity of illness  Dispo: The patient is from: ALF-memory care              Anticipated d/c is to:  To be determined              Patient currently is not medically stable to d/c.   Difficult to place patient No       Consultants:  None   Sch Meds:  Scheduled Meds:  Chlorhexidine Gluconate Cloth  6 each Topical Q0600   hydrocortisone sod succinate (SOLU-CORTEF) inj  100 mg Intravenous Q12H   mouth rinse  15 mL Mouth Rinse BID   pantoprazole (PROTONIX) IV  40 mg Intravenous Q12H   vancomycin variable dose per unstable renal function (pharmacist dosing)  Does not apply See admin instructions   Continuous Infusions:  sodium chloride 10 mL/hr at 09/01/21 1010   ceFEPime (MAXIPIME) IV 2 g (09/01/21 1012)   lactated ringers     lactated ringers 150 mL/hr at 09/01/21 0031   lactated ringers 150 mL/hr at 09/01/21 0738   metronidazole     vancomycin     PRN Meds:.sodium chloride, acetaminophen **OR** acetaminophen, morphine injection, ondansetron **OR** ondansetron (ZOFRAN) IV  Antimicrobials: Anti-infectives (From admission, onward)    Start     Dose/Rate Route Frequency Ordered Stop   09/01/21 2200  vancomycin (VANCOREADY) IVPB 750 mg/150 mL        750 mg 150 mL/hr over 60 Minutes  Intravenous Every 24 hours 09/01/21 0942     09/01/21 1108  vancomycin variable dose per unstable renal function (pharmacist dosing)         Does not apply See admin instructions 09/01/21 1108     09/01/21 1030  ceFEPIme (MAXIPIME) 2 g in sodium chloride 0.9 % 100 mL IVPB        2 g 200 mL/hr over 30 Minutes Intravenous Every 12 hours 09/01/21 0942     09/01/21 1000  metroNIDAZOLE (FLAGYL) IVPB 500 mg        500 mg 100 mL/hr over 60 Minutes Intravenous Every 12 hours 09/01/21 0546     09/01/21 0900  ceFEPIme (MAXIPIME) 2 g in sodium chloride 0.9 % 100 mL IVPB  Status:  Discontinued        2 g 200 mL/hr over 30 Minutes Intravenous Every 8 hours 09/01/21 0424 09/01/21 0938   08/31/21 2030  vancomycin (VANCOREADY) IVPB 1500 mg/300 mL        1,500 mg 150 mL/hr over 120 Minutes Intravenous  Once 08/31/21 2017 09/01/21 0238   08/31/21 2015  ceFEPIme (MAXIPIME) 2 g in sodium chloride 0.9 % 100 mL IVPB        2 g 200 mL/hr over 30 Minutes Intravenous  Once 08/31/21 2012 09/01/21 0121   08/31/21 2015  metroNIDAZOLE (FLAGYL) IVPB 500 mg        500 mg 100 mL/hr over 60 Minutes Intravenous  Once 08/31/21 2012 08/31/21 2253   08/31/21 2015  vancomycin (VANCOCIN) IVPB 1000 mg/200 mL premix  Status:  Discontinued        1,000 mg 200 mL/hr over 60 Minutes Intravenous  Once 08/31/21 2012 08/31/21 2017        I have personally reviewed the following labs and images: CBC: Recent Labs  Lab 08/31/21 2300 09/01/21 0706  WBC 15.7* 28.6*  NEUTROABS 15.0*  --   HGB 7.3* 9.6*  HCT 22.6* 27.5*  MCV 96.6 89.6  PLT 137* 163   BMP &GFR Recent Labs  Lab 08/31/21 2300 09/01/21 0556  NA 134* 138  K 3.6 3.7  CL 106 109  CO2 16* 20*  GLUCOSE 67* 85  BUN 21 29*  CREATININE 0.80 1.33*  CALCIUM 7.4* 8.6*   Estimated Creatinine Clearance: 35.3 mL/min (A) (by C-G formula based on SCr of 1.33 mg/dL (H)). Liver & Pancreas: Recent Labs  Lab 08/31/21 2300 09/01/21 0556  AST 30 41  ALT 15 20   ALKPHOS 70 90  BILITOT 0.5 0.9  PROT 3.6* 5.7*  ALBUMIN 1.8* 2.8*   No results for input(s): LIPASE, AMYLASE in the last 168 hours. No results for input(s): AMMONIA in the last 168 hours. Diabetic: No results for input(s): HGBA1C in the last 72 hours. No results for input(s):  GLUCAP in the last 168 hours. Cardiac Enzymes: Recent Labs  Lab 09/01/21 0415  CKTOTAL 317   No results for input(s): PROBNP in the last 8760 hours. Coagulation Profile: Recent Labs  Lab 08/31/21 2300 09/01/21 0556  INR 1.6* 1.2   Thyroid Function Tests: No results for input(s): TSH, T4TOTAL, FREET4, T3FREE, THYROIDAB in the last 72 hours. Lipid Profile: No results for input(s): CHOL, HDL, LDLCALC, TRIG, CHOLHDL, LDLDIRECT in the last 72 hours. Anemia Panel: No results for input(s): VITAMINB12, FOLATE, FERRITIN, TIBC, IRON, RETICCTPCT in the last 72 hours. Urine analysis:    Component Value Date/Time   COLORURINE YELLOW (A) 09/01/2021 0753   APPEARANCEUR CLEAR (A) 09/01/2021 0753   LABSPEC 1.020 09/01/2021 0753   PHURINE 6.0 09/01/2021 0753   GLUCOSEU NEGATIVE 09/01/2021 0753   HGBUR MODERATE (A) 09/01/2021 0753   BILIRUBINUR NEGATIVE 09/01/2021 0753   BILIRUBINUR NEG 01/21/2018 2022   KETONESUR NEGATIVE 09/01/2021 0753   PROTEINUR NEGATIVE 09/01/2021 0753   UROBILINOGEN 1.0 01/21/2018 2022   NITRITE NEGATIVE 09/01/2021 0753   LEUKOCYTESUR MODERATE (A) 09/01/2021 0753   Sepsis Labs: Invalid input(s): PROCALCITONIN, [place]  Microbiology: Recent Results (from the past 240 hour(s))  Resp Panel by RT-PCR (Flu A&B, Covid) Nasopharyngeal Swab     Status: None   Collection Time: 08/31/21  9:53 PM   Specimen: Nasopharyngeal Swab; Nasopharyngeal(NP) swabs in vial transport medium  Result Value Ref Range Status   SARS Coronavirus 2 by RT PCR NEGATIVE NEGATIVE Final    Comment: (NOTE) SARS-CoV-2 target nucleic acids are NOT DETECTED.  The SARS-CoV-2 RNA is generally detectable in upper  respiratory specimens during the acute phase of infection. The lowest concentration of SARS-CoV-2 viral copies this assay can detect is 138 copies/mL. A negative result does not preclude SARS-Cov-2 infection and should not be used as the sole basis for treatment or other patient management decisions. A negative result may occur with  improper specimen collection/handling, submission of specimen other than nasopharyngeal swab, presence of viral mutation(s) within the areas targeted by this assay, and inadequate number of viral copies(<138 copies/mL). A negative result must be combined with clinical observations, patient history, and epidemiological information. The expected result is Negative.  Fact Sheet for Patients:  EntrepreneurPulse.com.au  Fact Sheet for Healthcare Providers:  IncredibleEmployment.be  This test is no t yet approved or cleared by the [place] FDA and  has been authorized for detection and/or diagnosis of SARS-CoV-2 by FDA under an Emergency Use Authorization (EUA). This EUA will remain  in effect (meaning this test can be used) for the duration of the COVID-19 declaration under Section 564(b)(1) of the Act, 21 U.S.C.section 360bbb-3(b)(1), unless the authorization is terminated  or revoked sooner.       Influenza A by PCR NEGATIVE NEGATIVE Final   Influenza B by PCR NEGATIVE NEGATIVE Final    Comment: (NOTE) The Xpert Xpress SARS-CoV-2/FLU/RSV plus assay is intended as an aid in the diagnosis of influenza from Nasopharyngeal swab specimens and should not be used as a sole basis for treatment. Nasal washings and aspirates are unacceptable for Xpert Xpress SARS-CoV-2/FLU/RSV testing.  Fact Sheet for Patients: EntrepreneurPulse.com.au  Fact Sheet for Healthcare Providers: IncredibleEmployment.be  This test is not yet approved or cleared by the [place] FDA and has been  authorized for detection and/or diagnosis of SARS-CoV-2 by FDA under an Emergency Use Authorization (EUA). This EUA will remain in effect (meaning this test can be used) for the duration of the COVID-19 declaration under Section  564(b)(1) of the Act, 21 U.S.C. section 360bbb-3(b)(1), unless the authorization is terminated or revoked.  Performed at [facility], [place] [address] [place], [place] [address]   MRSA Next Gen by PCR, Nasal     Status: None   Collection Time: 09/01/21  5:45 AM   Specimen: Nasal Mucosa; Nasal Swab  Result Value Ref Range Status   MRSA by PCR Next Gen NOT DETECTED NOT DETECTED Final    Comment: (NOTE) The GeneXpert MRSA Assay (FDA approved for NASAL specimens only), is one component of a comprehensive MRSA colonization surveillance program. It is not intended to diagnose MRSA infection nor to guide or monitor treatment for MRSA infections. Test performance is not FDA approved in patients less than 57 years old. Performed at [facility], [place] [address], [place], [place] [address]     Radiology Studies: DG Forearm Left  Result Date: 08/31/2021 CLINICAL DATA:  Left arm bruising, dementia EXAM: LEFT FOREARM - 2 VIEW COMPARISON:  None. FINDINGS: There is no evidence of fracture or other focal bone lesions. Soft tissues are unremarkable. IMPRESSION: Negative. Electronically Signed   By: Fidela Salisbury M.D.   On: 08/31/2021 20:41   CT HEAD WO CONTRAST (5MM)  Result Date: 08/31/2021 CLINICAL DATA:  Mental status change, unknown cause. Left forehead injury, scalp soft tissue swelling, dementia. EXAM: CT HEAD WITHOUT CONTRAST TECHNIQUE: Contiguous axial images were obtained from the base of the skull through the vertex without intravenous contrast. COMPARISON:  08/29/2021, 11/22/2018 FINDINGS: Brain: Normal anatomic configuration of the brain. Moderate parenchymal volume loss is commensurate with the patient's age and stable since  immediate prior examination, though appears slightly progressive since remote prior exam. Extensive bilateral subcortical and periventricular white matter changes are again identified likely reflecting the sequela of small vessel ischemia. Mild ventriculomegaly is stable, commensurate with the degree of parenchymal volume loss and likely reflecting the sequela of central atrophy. Multiple remote lacunar infarcts are noted within the basal ganglia bilaterally, the thalami bilaterally, and left cerebellar hemisphere. No acute intracranial hemorrhage or infarct. No abnormal mass effect or midline shift. No abnormal intra or extra-axial mass lesion. Vascular: No asymmetric hyperdense vasculature at the skull base. Skull: Intact Sinuses/Orbits: Paranasal sinuses are clear. Orbits are unremarkable. Other: Mastoid air cells and middle ear cavities are clear. There is increasing frontal scalp soft tissue swelling as well as left preseptal soft tissue swelling. Skin staples are seen anteriorly. IMPRESSION: Progressive frontal and left preseptal soft tissue swelling. No acute intracranial injury. No calvarial fracture. Advanced senescent change, progressive since remote prior examination. Multiple bilateral remote lacunar infarcts as outlined above. Electronically Signed   By: Fidela Salisbury M.D.   On: 08/31/2021 22:24   DG Chest Port 1 View  Result Date: 08/31/2021 CLINICAL DATA:  Sepsis, altered mental status, hematemesis EXAM: PORTABLE CHEST 1 VIEW COMPARISON:  08/29/2021 FINDINGS: Lungs are clear. No pneumothorax or pleural effusion. Mild cardiomegaly is stable. Pulmonary vascularity is normal. No acute bone abnormality. IMPRESSION: No active disease.  Stable cardiomegaly. Electronically Signed   By: Fidela Salisbury M.D.   On: 08/31/2021 22:18   CT CHEST ABDOMEN PELVIS WO CONTRAST  Result Date: 09/01/2021 CLINICAL DATA:  Bloody emesis and diarrhea. EXAM: CT CHEST, ABDOMEN AND PELVIS WITHOUT CONTRAST TECHNIQUE:  Multidetector CT imaging of the chest, abdomen and pelvis was performed following the standard protocol without IV contrast. COMPARISON:  Chest CT 01/14/2020 FINDINGS: CT CHEST FINDINGS Cardiovascular: Normal heart size. No pericardial effusion. Aortic and coronary  atherosclerosis. Mediastinum/Nodes: Negative for hematoma or pneumomediastinum. Small sliding hiatal hernia. No esophageal thickening or periesophageal inflammation seen. Lungs/Pleura: Dependent atelectasis. No focal opacity to suggest pneumonia or alveolar hemorrhage. Musculoskeletal: No chest wall mass or suspicious bone lesions identified. CT ABDOMEN PELVIS FINDINGS Hepatobiliary: No hepatic injury or perihepatic hematoma. Hazy high-density in the gallbladder without discrete calcified stone, usually sludge. Pancreas: No acute finding. Punctate calcification at the pancreatic head with no CBD dilatation or biliary labs derangement, best attributed to parenchymal calcification next to the CBD. Spleen: Normal in size without focal abnormality. Adrenals/Urinary Tract: Negative adrenals. Small high-density nodule in the upper pole right kidney consistent with hemorrhagic cyst. Thick walled bladder with perivesicular fat stranding Stomach/Bowel: Appendectomy. No fluid distended stomach, small-bowel obstruction, or inflammatory bowel wall thickening. No distal colonic fluid levels to correlate with diarrhea history. Vascular/Lymphatic: Atheromatous plaque, expected. Reproductive: Suspect prior trans urethral prostate resection Other: No ascites or pneumoperitoneum. 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 IMPRESSION: 1. No visible source of GI bleeding. 2. Thick walled bladder with perivesicular stranding, please correlate for cystitis. 3. Chronic/senescent changes described above. Electronically Signed   By: Monte Fantasia M.D.   On: 09/01/2021 04:15      Candie Gintz T[place]  If  7PM-7AM, please contact night-coverage [email or web address] 09/01/2021, 11:10 AM

## 2021-09-01 NOTE — Sepsis Progress Note (Signed)
Conversation took place via Management consultant, Physiological scientist stated Blood Culture gathered and sent prior to Abx's adm and that ED MD has plans to speak with family concerning status

## 2021-09-02 ENCOUNTER — Inpatient Hospital Stay (HOSPITAL_COMMUNITY)

## 2021-09-02 DIAGNOSIS — E8809 Other disorders of plasma-protein metabolism, not elsewhere classified: Secondary | ICD-10-CM | POA: Diagnosis not present

## 2021-09-02 DIAGNOSIS — R6510 Systemic inflammatory response syndrome (SIRS) of non-infectious origin without acute organ dysfunction: Secondary | ICD-10-CM | POA: Diagnosis not present

## 2021-09-02 DIAGNOSIS — D500 Iron deficiency anemia secondary to blood loss (chronic): Secondary | ICD-10-CM | POA: Diagnosis not present

## 2021-09-02 DIAGNOSIS — R001 Bradycardia, unspecified: Secondary | ICD-10-CM

## 2021-09-02 DIAGNOSIS — G9340 Encephalopathy, unspecified: Secondary | ICD-10-CM | POA: Diagnosis not present

## 2021-09-02 LAB — RENAL FUNCTION PANEL
Albumin: 2.6 g/dL — ABNORMAL LOW (ref 3.5–5.0)
Anion gap: 7 (ref 5–15)
BUN: 29 mg/dL — ABNORMAL HIGH (ref 8–23)
CO2: 19 mmol/L — ABNORMAL LOW (ref 22–32)
Calcium: 8.5 mg/dL — ABNORMAL LOW (ref 8.9–10.3)
Chloride: 111 mmol/L (ref 98–111)
Creatinine, Ser: 1.29 mg/dL — ABNORMAL HIGH (ref 0.61–1.24)
GFR, Estimated: 55 mL/min — ABNORMAL LOW (ref >60)
Glucose, Bld: 114 mg/dL — ABNORMAL HIGH (ref 70–99)
Phosphorus: 3.3 mg/dL (ref 2.5–4.6)
Potassium: 3.8 mmol/L (ref 3.5–5.1)
Sodium: 137 mmol/L (ref 135–145)

## 2021-09-02 LAB — MAGNESIUM: Magnesium: 1.5 mg/dL — ABNORMAL LOW (ref 1.7–2.4)

## 2021-09-02 LAB — GLUCOSE, CAPILLARY: Glucose-Capillary: 107 mg/dL — ABNORMAL HIGH (ref 70–99)

## 2021-09-02 LAB — CBC
HCT: 26.1 % — ABNORMAL LOW (ref 39.0–52.0)
Hemoglobin: 8.9 g/dL — ABNORMAL LOW (ref 13.0–17.0)
MCH: 30.9 pg (ref 26.0–34.0)
MCHC: 34.1 g/dL (ref 30.0–36.0)
MCV: 90.6 fL (ref 80.0–100.0)
Platelets: 121 10*3/uL — ABNORMAL LOW (ref 150–400)
RBC: 2.88 MIL/uL — ABNORMAL LOW (ref 4.22–5.81)
RDW: 14.1 % (ref 11.5–15.5)
WBC: 16.9 10*3/uL — ABNORMAL HIGH (ref 4.0–10.5)
nRBC: 0 % (ref 0.0–0.2)

## 2021-09-02 LAB — HEMOGLOBIN AND HEMATOCRIT, BLOOD
HCT: 24.6 % — ABNORMAL LOW (ref 39.0–52.0)
HCT: 27.8 % — ABNORMAL LOW (ref 39.0–52.0)
Hemoglobin: 8.5 g/dL — ABNORMAL LOW (ref 13.0–17.0)
Hemoglobin: 9.4 g/dL — ABNORMAL LOW (ref 13.0–17.0)

## 2021-09-02 LAB — URINE CULTURE: Culture: NO GROWTH

## 2021-09-02 LAB — VANCOMYCIN, RANDOM: Vancomycin Rm: 10

## 2021-09-02 LAB — PROCALCITONIN: Procalcitonin: 55.45 ng/mL

## 2021-09-02 MED ORDER — HYDROCORTISONE NA SUCCINATE PF 100 MG IJ SOLR
50.0000 mg | Freq: Two times a day (BID) | INTRAMUSCULAR | Status: DC
  Administered 2021-09-02 – 2021-09-03 (×2): 50 mg via INTRAVENOUS
  Filled 2021-09-02 (×2): qty 2

## 2021-09-02 MED ORDER — MAGNESIUM SULFATE 2 GM/50ML IV SOLN
2.0000 g | Freq: Once | INTRAVENOUS | Status: AC
  Administered 2021-09-02: 2 g via INTRAVENOUS
  Filled 2021-09-02: qty 50

## 2021-09-02 MED ORDER — IOHEXOL 300 MG/ML  SOLN
100.0000 mL | Freq: Once | INTRAMUSCULAR | Status: AC | PRN
  Administered 2021-09-02: 100 mL via ORAL

## 2021-09-02 MED ORDER — VANCOMYCIN HCL 750 MG/150ML IV SOLN
750.0000 mg | INTRAVENOUS | Status: DC
  Administered 2021-09-02 – 2021-09-03 (×2): 750 mg via INTRAVENOUS
  Filled 2021-09-02 (×2): qty 150

## 2021-09-02 MED ORDER — KCL IN DEXTROSE-NACL 20-5-0.9 MEQ/L-%-% IV SOLN
INTRAVENOUS | Status: DC
  Administered 2021-09-02: 100 mL/h via INTRAVENOUS
  Filled 2021-09-02 (×4): qty 1000

## 2021-09-02 MED ORDER — SODIUM CHLORIDE 0.9 % IR SOLN: Status: DC | PRN

## 2021-09-02 MED ORDER — HYDROGEN PEROXIDE 3 % EX SOLN
CUTANEOUS | Status: AC
  Filled 2021-09-02: qty 473

## 2021-09-02 MED ORDER — HYDROGEN PEROXIDE 3 % EX SOLN: Freq: Once | CUTANEOUS | Status: DC | PRN

## 2021-09-02 NOTE — Progress Notes (Signed)
SLP Cancellation Note  Patient Details Name: Anthony Hayes MRN: [id number] DOB: November 02, 1935   Cancelled treatment:       Reason Eval/Treat Not Completed: Other (comment) (per message from RN, esophagram will be re-attempted at 1400 today per family request)  Kathleen Lime, MS [facility] SLP [place] Office [phone] Pager [phone]  [name] 09/02/2021, 12:07 PM

## 2021-09-02 NOTE — Progress Notes (Signed)
[facility] [id number] - [facility]) hospitalized hospice patient.    This is a current hospice patient with a terminal diagnosis of cerebral atherosclerosis. Patient was transported from [facility] to [facility] ED for evaluation of altered mental status, bloody emesis and diarrhea. Family contacted hospice prior to this transfer. Patient was admitted to [facility] on 09/01/2021 with a diagnosis of sepsis and GI bleed. Per Dr. Orpah Melter with [facility], this is a related hospital admission.    Visited at bedside,family not present. Received report from bedside nurse. Patient is awake, alert and oriented at baseline. More talkative today. Attempted Esophagram today but patient was not able to follow instructions. Reschedule for afternoon and daughter will go with him and assist. Patient is currently NPO except for ice chips.    This patient remains inpatient appropriate due to the need of IV fluids and antibiotics. Diagnostics to rule out esophageal tear.   VS:98, 125/50, 41, 18, 98% RA I/O: 5372.8/600  Abnormal Labs: 09/02/2021 05:55 Hemoglobin: 8.5 (L) HCT: 24.6 (L) 09/02/2021 00:42 CO2: 19 (L) Glucose: 114 (H) BUN: 29 (H) Creatinine: 1.29 (H) Calcium: 8.5 (L) Magnesium: 1.5 (L) Albumin: 2.6 (L) GFR, Estimated: 55 (L) WBC: 16.9 (H) RBC: 2.88 (L) Hemoglobin: 8.9 HCT: 26.1 (L) Platelets: 121 (L)  IV/PRN Meds: Maxipime 2g IVPB BID, Flagyl 520m IVPB BID, LR 1579mhr, no PRNs    Problem List: -Acute blood loss anemia due to upper GI bleed-presents with hematemesis. ---Hgb improved to 9.6  -SIRS/hypotension/lactic acidosis-no clear source of infection yet.  Recent history of dental infection. ---Continue broad-spectrum antibiotics with vancomycin, cefepime and Flagyl -Acute metabolic encephalopathy in patient with advanced dementia-multifactorial.  -AKI/azotemia -Metabolic acidosis-likely due to lactic acidosis and dehydration. -Coagulopathy: Resolved. -Debility/fall  at nursing home-patient had a fall with head injury 3 days prior to presentation.  Had forehead laceration that has been repaired with staples   Discharge planning: Return to memory care when stable for discharge is appropriate.   Family Contact: spoke with daughters by phone    IDG: Updated   GOC: Clear. DNR. Treat the infection but no aggressive interventions such as blood transfusions or colonoscopy/endoscopy.    Should patient need ambulance transport at discharge please [facility].   Please do not hesitate to call with questions.   Thank you,   MaFarrel GordonRN, CCC[facility] Hospitaliaison   [phone]

## 2021-09-02 NOTE — Progress Notes (Signed)
Pharmacy Antibiotic Note  Anthony Hayes is a 86 y.o. male admitted on 08/31/2021 with sepsis.  In the ED, patient received Vancomycin 1519m IV, Cefepime 2gm IV and Metronidazole 5024mIV x 1 dose each.  Upon admission, Pharmacy has been consulted for Cefepime and vancomycin dosing.  Vanc random 0042 = 10 mcg/ml, SCr on admit 1.12 >> 1.29 Cultures no growth, CXray no acute process, ?source sepsis?  Plan: Vancomycin [id number]   SCr daily Adjust Cefepime to 2gm IV q12h Metronidazole 500 mg IV q12h  Consideration back to hospice care  Height: 5' 6"  (167.6 cm) Weight: 60.4 kg (133 lb 2.5 oz) IBW/kg (Calculated) : 63.8  Temp (24hrs), Avg:98.2 F (36.8 C), Min:97.5 F (36.4 C), Max:99.3 F (37.4 C)  Recent Labs  Lab 08/29/21 1848 08/31/21 2300 09/01/21 0415 09/01/21 0547 09/01/21 0556 09/01/21 0706 09/02/21 0042  WBC 9.3 15.7*  --   --   --  28.6* 16.9*  CREATININE 1.12 0.80  --   --  1.33*  --  1.29*  LATICACIDVEN  --   --  3.2* 2.5*  --   --   --   VANCORANDOM  --   --   --   --   --   --  10     Estimated Creatinine Clearance: 36.4 mL/min (A) (by C-G formula based on SCr of 1.29 mg/dL (H)).    Allergies  Allergen Reactions   Lisinopril Swelling    Swelling of lips    Lisinopril Other (See Comments)    "Allergic," per MAR    Antimicrobials this admission:  9/4 Flagyl >>  9/5 Vancomycin >>  9/5 Cefepime >>  Dose adjustments this admission:   Microbiology results:  9/4 BCx:  ngtd 9/5 UCx: ng-final 9/4 resp panel: negative  9/5 MRSA PCR: negative   Thank you for allowing pharmacy to be a part of this patient's care.  GreMinda DittoarmD [facility] [id number]/2022, 10:40 AM

## 2021-09-02 NOTE — Plan of Care (Signed)
Hospice following. Esophageal flexogram done today. Pt is bradycardic per baseline. Confused and pleasant able to follow commands when given time. RA. Ext cath in place with adequate UO. No bm for shift. 2 PIVs R arm. IVF d/t NPO status. Daughters visited today and updated. Pt repositioned q2h d/t limited mobility. Foam dressing in place. SCDs on. Fall precautions in place.    Problem: Health Behavior/Discharge Planning: Goal: Ability to manage health-related needs will improve Outcome: Progressing   Problem: Clinical Measurements: Goal: Respiratory complications will improve Outcome: Progressing   Problem: Elimination: Goal: Will not experience complications related to bowel motility Outcome: Progressing   Problem: Safety: Goal: Ability to remain free from injury will improve Outcome: Progressing   Problem: Skin Integrity: Goal: Risk for impaired skin integrity will decrease Outcome: Progressing

## 2021-09-02 NOTE — Progress Notes (Signed)
PROGRESS NOTE  Anthony Hayes TMA:[id number] DOB: 09-19-1935   PCP: Etter Sjogren, DO  Patient is from: [facility]  DOA: 08/31/2021 LOS: 1  Chief complaints:  Chief Complaint  Patient presents with   Altered Mental Status     Brief Narrative / Interim history: 86 year old M with history of dementia on hospice, HTN, hypothyroidism, BPH, GERD, esophageal tear, and osteoarthritis brought to ED by EMS with concern for hematemesis, hematochezia and mental status change, and admitted for ABLA from acute GI bleed, SIRS and hypotension.  He was febrile with leukocytosis, hypotension and lactic acidosis.  Hgb 7.3 (baseline 13 about 3 days prior).  CXR and CT head without significant finding other than frontal and left preseptal soft tissue swelling.  CT abdomen and pelvis raises concern for cystitis.  UA with moderate Hgb, LE and rare bacteria.  Cultures obtained.  Patient was started on IV fluid and broad-spectrum antibiotics, and admitted.  Per admitting provider, family not interested in blood transfusion, ACLS meds or invasive procedures such as endoscopy or colonoscopy as of now.   Blood and urine cultures negative.  Blood pressure improved but bradycardic to 30s and 40s.  He is not symptomatic from this.  Per family, baseline heart rate is in 32s.  Remains on broad-spectrum antibiotics until cultures are negative for 48 hours.  Family not interested in escalation of care.  Follow esophagram.   Subjective: Seen and examined earlier this morning.  No major events overnight or this morning.  He is a sleepy but wakes to voice.  He denies pain, shortness of breath, nausea or vomiting.  However, he is not a great historian.  He is only oriented to self and "[facility]".  He thinks he is in [place]. Objective: Vitals:   09/02/21 1100 09/02/21 1145 09/02/21 1200 09/02/21 1300  BP:  (!) 133/49 (!) 125/50 (!) 139/55  Pulse: (!) 42 (!) 38 (!) 41 (!) 40  Resp: 17 17  18 17   Temp:   98 F (36.7 C)   TempSrc:   Oral   SpO2: 100% 98% 98% 100%  Weight:      Height:        Intake/Output Summary (Last 24 hours) at 09/02/2021 1327 Last data filed at 09/02/2021 1200 Gross per 24 hour  Intake 5522.77 ml  Output 1200 ml  Net 4322.77 ml   Filed Weights   08/31/21 2009 09/01/21 0600  Weight: 66 kg 60.4 kg    Examination:  GENERAL: No apparent distress.  Nontoxic. HEENT:Vision and hearing grossly intact.  PERRL.  [place] laceration.  Staples and stitches in place. NECK: Supple.  No apparent JVD.  RESP: 100% on RA.  No IWOB.  Fair aeration bilaterally. CVS: HR ranges from 38-43.  Regular rhythm.  Heart sounds normal.  ABD/GI/GU: BS+. Abd soft, NTND.  MSK/EXT:  Moves extremities. No apparent deformity. No edema.  SKIN: no apparent skin lesion or wound NEURO: Sleepy but wakes to voice.  Oriented to self and hospital.  Follows commands.  PERRL.  Patellar reflex symmetric.  No apparent focal neuro deficit but limited exam due to mental status. PSYCH: Calm.  No distress or agitation.  Procedures:  None  Microbiology summarized: [id number] and influenza PCR nonreactive. MRSA PCR screen nonreactive. Urine culture pending Blood culture pending  Assessment & Plan: Acute blood loss anemia due to upper GI bleed-presents with hematemesis.  History of GERD and esophageal ulcer in the past.  Fecal Hemoccult negative.  Patient is  on Plavix likely for TIA.  CT abdomen and pelvis without significant finding.  Family not interested in blood transfusion or invasive intervention such as endoscopy/colonoscopy as of now.  Fortunately, Hgb improved to 9.4.  Blood pressure improved as well. Recent Labs    11/16/20 1239 08/29/21 1848 08/31/21 2300 09/01/21 0706 09/01/21 1300 09/01/21 1755 09/02/21 0042 09/02/21 0555 09/02/21 1311  HGB 12.2* 13.9 7.3* 9.6* 12.5* 8.3* 8.9* 8.5* 9.4*  -Continue IV Protonix 40 mg twice daily -Esophagram with water soluble  contrast. -N.p.o. except ice chips pending esophagram -Continue IV fluid for hydration -Discontinue Plavix indefinitely-I do not see clear indication.  SIRS/hypotension/lactic acidosis-no clear source of infection yet.  Recent history of dental infection.  He has fever, leukocytosis, hypotension and lactic acidosis.  BP improved.  Cultures negative.  No respiratory symptoms.  CT abdomen and pelvis unrevealing.  UA not convincing for UTI.  A.m. cortisol appropriately high.  Sepsis physiology improving. -Continue vancomycin, cefepime and Flagyl until cultures are negative for 48 hours. -Follow blood and urine cultures -Trend lactic acidosis-improving. -Trend leukocytosis-improving -Start weaning stress dose steroid -Continue holding home antihypertensive meds  Bradycardia: Seems to have junctional rhythm on telemetry monitoring.  He is not on nodal blocking agents or Aricept.  Per family, baseline heart rate in 63s.  He does not seem to be symptomatic from this.  Seems to demonstrate some chronotropic response with minimal activity. -Check twelve-lead EKG -Avoid nodal blocking agents -No atropine since family not interested in ACLS meds.  Acute metabolic encephalopathy in patient with advanced dementia-multifactorial.  CT head without significant finding other than frontal soft tissue swelling from recent fall and trauma.  Has some signs of infection but no meningeal sign.  No apparent focal neurodeficit to suggest CVA.  He seems to take Ativan and Compazine at facility.  He is awake and oriented to self and hospital.  Per family, this is his baseline. -Treat treatable causes as above -Aspiration, fall and delirium precautions -Minimize or avoid sedating medications  AKI/azotemia: Likely a combination of prerenal from hypotension and possible ATN from infectious process.  CT did not show obstructive etiology Recent Labs    11/16/20 1239 08/29/21 1848 08/31/21 2300 09/01/21 0556  09/02/21 0042  BUN 26* 31* 21 29* 29*  CREATININE 0.97 1.12 0.80 1.33* 1.29*  -Monitor urine output -Continue IV fluid -Avoid nephrotoxic meds -Continue monitoring -Further work-up if no improvement  Metabolic acidosis-likely due to lactic acidosis and dehydration.  Likely from lactic acidosis and AKI.  Improving. -Continue monitoring  Coagulopathy: Resolved.  Leukocytosis/bandemia-suspect infectious source.  Improving. -Continue broad-spectrum antibiotics  Hypothyroidism -Start IV Synthroid if no p.o. intake in the next 24 hours.  Debility/fall at nursing home-patient had a fall with head injury 3 days prior to presentation.  Had forehead laceration that has been repaired with staples and stitches on 9/2. -Staple and stitch removal in 5 to 7 days from date of repair -Fall precautions -PT/OT eval if family is interested since he is enrolled with home hospice  Goal of care counseling-patient with advanced dementia and comorbidity as above.  He is enrolled with home hospice for about 2 years now.  He is appropriately DNR/DNI.  Family not interested in blood transfusion, ACLS meds and invasive procedures such as EGD and colonoscopy. -Further goal of care discussion based on clinical progress   Body mass index is 21.49 kg/m.        DVT prophylaxis:  SCDs Start: 09/01/21 0547  Code Status: DNR/DNI Family Communication:  Updated patient's daughters at bedside. Level of care: Telemetry Status is: Inpatient  Remains inpatient appropriate because:Hemodynamically unstable, Altered mental status, Ongoing diagnostic testing needed not appropriate for outpatient work up, IV treatments appropriate due to intensity of illness or inability to take PO, and Inpatient level of care appropriate due to severity of illness  Dispo: The patient is from: ALF-memory care              Anticipated d/c is to:  To be determined              Patient currently is not medically stable to d/c.    Difficult to place patient No       Consultants:  None   Sch Meds:  Scheduled Meds:  Chlorhexidine Gluconate Cloth  6 each Topical Q0600   hydrocortisone sod succinate (SOLU-CORTEF) inj  100 mg Intravenous Q12H   hydrogen peroxide       mouth rinse  15 mL Mouth Rinse BID   pantoprazole (PROTONIX) IV  40 mg Intravenous Q12H   vancomycin variable dose per unstable renal function (pharmacist dosing)   Does not apply See admin instructions   Continuous Infusions:  sodium chloride Stopped (09/02/21 0322)   ceFEPime (MAXIPIME) IV Stopped (09/02/21 1240)   lactated ringers     metronidazole Stopped (09/02/21 1240)   vancomycin 750 mg (09/02/21 1255)   PRN Meds:.sodium chloride, acetaminophen **OR** acetaminophen, hydrogen peroxide, morphine injection, ondansetron **OR** ondansetron (ZOFRAN) IV  Antimicrobials: Anti-infectives (From admission, onward)    Start     Dose/Rate Route Frequency Ordered Stop   09/02/21 1200  vancomycin (VANCOREADY) IVPB 750 mg/150 mL        750 mg 150 mL/hr over 60 Minutes Intravenous Every 24 hours 09/02/21 1035     09/01/21 2200  vancomycin (VANCOREADY) IVPB 750 mg/150 mL  Status:  Discontinued        750 mg 150 mL/hr over 60 Minutes Intravenous Every 24 hours 09/01/21 0942 09/01/21 1202   09/01/21 1108  vancomycin variable dose per unstable renal function (pharmacist dosing)         Does not apply See admin instructions 09/01/21 1108     09/01/21 1030  ceFEPIme (MAXIPIME) 2 g in sodium chloride 0.9 % 100 mL IVPB        2 g 200 mL/hr over 30 Minutes Intravenous Every 12 hours 09/01/21 0942     09/01/21 1000  metroNIDAZOLE (FLAGYL) IVPB 500 mg        500 mg 100 mL/hr over 60 Minutes Intravenous Every 12 hours 09/01/21 0546     09/01/21 0900  ceFEPIme (MAXIPIME) 2 g in sodium chloride 0.9 % 100 mL IVPB  Status:  Discontinued        2 g 200 mL/hr over 30 Minutes Intravenous Every 8 hours 09/01/21 0424 09/01/21 0938   08/31/21 2030  vancomycin  (VANCOREADY) IVPB 1500 mg/300 mL        1,500 mg 150 mL/hr over 120 Minutes Intravenous  Once 08/31/21 2017 09/01/21 0238   08/31/21 2015  ceFEPIme (MAXIPIME) 2 g in sodium chloride 0.9 % 100 mL IVPB        2 g 200 mL/hr over 30 Minutes Intravenous  Once 08/31/21 2012 09/01/21 0121   08/31/21 2015  metroNIDAZOLE (FLAGYL) IVPB 500 mg        500 mg 100 mL/hr over 60 Minutes Intravenous  Once 08/31/21 2012 08/31/21 2253   08/31/21 2015  vancomycin (VANCOCIN) IVPB 1000 mg/200 mL premix  Status:  Discontinued        1,000 mg 200 mL/hr over 60 Minutes Intravenous  Once 08/31/21 2012 08/31/21 2017        I have personally reviewed the following labs and images: CBC: Recent Labs  Lab 08/29/21 1848 08/31/21 2300 09/01/21 0706 09/01/21 1300 09/01/21 1755 09/02/21 0042 09/02/21 0555 09/02/21 1311  WBC 9.3 15.7* 28.6*  --   --  16.9*  --   --   NEUTROABS 6.6 15.0*  --   --   --   --   --   --   HGB 13.9 7.3* 9.6* 12.5* 8.3* 8.9* 8.5* 9.4*  HCT 41.0 22.6* 27.5* 38.1* 23.8* 26.1* 24.6* 27.8*  MCV 90.9 96.6 89.6  --   --  90.6  --   --   PLT 298 137* 163  --   --  121*  --   --    BMP &GFR Recent Labs  Lab 08/29/21 1848 08/31/21 2300 09/01/21 0556 09/02/21 0042  NA 138 134* 138 137  K 4.0 3.6 3.7 3.8  CL 105 106 109 111  CO2 24 16* 20* 19*  GLUCOSE 95 67* 85 114*  BUN 31* 21 29* 29*  CREATININE 1.12 0.80 1.33* 1.29*  CALCIUM 9.7 7.4* 8.6* 8.5*  MG  --   --   --  1.5*  PHOS  --   --   --  3.3   Estimated Creatinine Clearance: 36.4 mL/min (A) (by C-G formula based on SCr of 1.29 mg/dL (H)). Liver & Pancreas: Recent Labs  Lab 08/29/21 1848 08/31/21 2300 09/01/21 0556 09/02/21 0042  AST 27 30 41  --   ALT 16 15 20   --   ALKPHOS 120 70 90  --   BILITOT 0.9 0.5 0.9  --   PROT 7.6 3.6* 5.7*  --   ALBUMIN 3.8 1.8* 2.8* 2.6*   No results for input(s): LIPASE, AMYLASE in the last 168 hours. No results for input(s): AMMONIA in the last 168 hours. Diabetic: No results for  input(s): HGBA1C in the last 72 hours. Recent Labs  Lab 09/02/21 0759  GLUCAP 107*   Cardiac Enzymes: Recent Labs  Lab 08/29/21 1848 09/01/21 0415  CKTOTAL 69 317   No results for input(s): PROBNP in the last 8760 hours. Coagulation Profile: Recent Labs  Lab 08/29/21 1848 08/31/21 2300 09/01/21 0556  INR 1.1 1.6* 1.2   Thyroid Function Tests: No results for input(s): TSH, T4TOTAL, FREET4, T3FREE, THYROIDAB in the last 72 hours. Lipid Profile: No results for input(s): CHOL, HDL, LDLCALC, TRIG, CHOLHDL, LDLDIRECT in the last 72 hours. Anemia Panel: No results for input(s): VITAMINB12, FOLATE, FERRITIN, TIBC, IRON, RETICCTPCT in the last 72 hours. Urine analysis:    Component Value Date/Time   COLORURINE YELLOW (A) 09/01/2021 0753   APPEARANCEUR CLEAR (A) 09/01/2021 0753   LABSPEC 1.020 09/01/2021 0753   PHURINE 6.0 09/01/2021 0753   GLUCOSEU NEGATIVE 09/01/2021 0753   HGBUR MODERATE (A) 09/01/2021 0753   BILIRUBINUR NEGATIVE 09/01/2021 0753   BILIRUBINUR NEG 01/21/2018 2022   KETONESUR NEGATIVE 09/01/2021 0753   PROTEINUR NEGATIVE 09/01/2021 0753   UROBILINOGEN 1.0 01/21/2018 2022   NITRITE NEGATIVE 09/01/2021 0753   LEUKOCYTESUR MODERATE (A) 09/01/2021 0753   Sepsis Labs: Invalid input(s): PROCALCITONIN, [place]  Microbiology: Recent Results (from the past 240 hour(s))  Culture, blood (single)     Status: None (Preliminary result)   Collection Time: 08/31/21  9:31 PM   Specimen: BLOOD  Result Value  Ref Range Status   Specimen Description   Final    BLOOD BLOOD RIGHT FOREARM Performed at [facility] [address], [place], [place] [address]    Special Requests   Final    BOTTLES DRAWN AEROBIC AND ANAEROBIC Blood Culture adequate volume Performed at [facility] [address], [place], [place] [address]    Culture   Final    NO GROWTH 1 DAY Performed at [facility], [place] [address], [place], [place]  [address]    Report Status PENDING  Incomplete  Resp Panel by RT-PCR (Flu A&B, Covid) Nasopharyngeal Swab     Status: None   Collection Time: 08/31/21  9:53 PM   Specimen: Nasopharyngeal Swab; Nasopharyngeal(NP) swabs in vial transport medium  Result Value Ref Range Status   SARS Coronavirus 2 by RT PCR NEGATIVE NEGATIVE Final    Comment: (NOTE) SARS-CoV-2 target nucleic acids are NOT DETECTED.  The SARS-CoV-2 RNA is generally detectable in upper respiratory specimens during the acute phase of infection. The lowest concentration of SARS-CoV-2 viral copies this assay can detect is 138 copies/mL. A negative result does not preclude SARS-Cov-2 infection and should not be used as the sole basis for treatment or other patient management decisions. A negative result may occur with  improper specimen collection/handling, submission of specimen other than nasopharyngeal swab, presence of viral mutation(s) within the areas targeted by this assay, and inadequate number of viral copies(<138 copies/mL). A negative result must be combined with clinical observations, patient history, and epidemiological information. The expected result is Negative.  Fact Sheet for Patients:  EntrepreneurPulse.com.au  Fact Sheet for Healthcare Providers:  IncredibleEmployment.be  This test is no t yet approved or cleared by the [place] FDA and  has been authorized for detection and/or diagnosis of SARS-CoV-2 by FDA under an Emergency Use Authorization (EUA). This EUA will remain  in effect (meaning this test can be used) for the duration of the COVID-19 declaration under Section 564(b)(1) of the Act, 21 U.S.C.section 360bbb-3(b)(1), unless the authorization is terminated  or revoked sooner.       Influenza A by PCR NEGATIVE NEGATIVE Final   Influenza B by PCR NEGATIVE NEGATIVE Final    Comment: (NOTE) The Xpert Xpress SARS-CoV-2/FLU/RSV plus assay is intended as an  aid in the diagnosis of influenza from Nasopharyngeal swab specimens and should not be used as a sole basis for treatment. Nasal washings and aspirates are unacceptable for Xpert Xpress SARS-CoV-2/FLU/RSV testing.  Fact Sheet for Patients: EntrepreneurPulse.com.au  Fact Sheet for Healthcare Providers: IncredibleEmployment.be  This test is not yet approved or cleared by the [place] FDA and has been authorized for detection and/or diagnosis of SARS-CoV-2 by FDA under an Emergency Use Authorization (EUA). This EUA will remain in effect (meaning this test can be used) for the duration of the COVID-19 declaration under Section 564(b)(1) of the Act, 21 U.S.C. section 360bbb-3(b)(1), unless the authorization is terminated or revoked.  Performed at [facility], [place] [address], [place], [place] [address]   Blood Culture (routine x 2)     Status: None (Preliminary result)   Collection Time: 08/31/21 11:17 PM   Specimen: BLOOD  Result Value Ref Range Status   Specimen Description   Final    BLOOD RIGHT ANTECUBITAL Performed at [facility] [address], [place],  [address]    Special Requests   Final    BOTTLES DRAWN AEROBIC AND ANAEROBIC Blood Culture  adequate volume Performed at [facility] [address], [place], [place] [address]    Culture   Final    NO GROWTH 1 DAY Performed at [facility], [place] [address], [place], [place] [address]    Report Status PENDING  Incomplete  MRSA Next Gen by PCR, Nasal     Status: None   Collection Time: 09/01/21  5:45 AM   Specimen: Nasal Mucosa; Nasal Swab  Result Value Ref Range Status   MRSA by PCR Next Gen NOT DETECTED NOT DETECTED Final    Comment: (NOTE) The GeneXpert MRSA Assay (FDA approved for NASAL specimens only), is one component of a comprehensive MRSA colonization surveillance program. It is not intended to  diagnose MRSA infection nor to guide or monitor treatment for MRSA infections. Test performance is not FDA approved in patients less than 35 years old. Performed at [facility], [place] [address] [place], [place] [address]   Blood Culture (routine x 2)     Status: None (Preliminary result)   Collection Time: 09/01/21  5:47 AM   Specimen: BLOOD  Result Value Ref Range Status   Specimen Description   Final    BLOOD BLOOD LEFT FOREARM Performed at [facility] [address], [place], [place] [address]    Special Requests   Final    BOTTLES DRAWN AEROBIC AND ANAEROBIC Blood Culture adequate volume Performed at [facility] [address], [place], [place] [address]    Culture   Final    NO GROWTH < 24 HOURS Performed at [facility] [address] [place], [place] [address]    Report Status PENDING  Incomplete  Urine Culture     Status: None   Collection Time: 09/01/21  7:53 AM   Specimen: Urine, Catheterized  Result Value Ref Range Status   Specimen Description   Final    URINE, CATHETERIZED Performed at [facility] [address], [place], [place] [address]    Special Requests   Final    NONE Performed at [facility], [place] [address], [place], [place] [address]    Culture   Final    NO GROWTH Performed at [facility], [place] [address] [place],  [address]    Report Status 09/02/2021 FINAL  Final    Radiology Studies: No results found.    Anthony Hayes T. [place]  If 7PM-7AM, please contact night-coverage [email or web address] 09/02/2021, 1:27 PM

## 2021-09-02 NOTE — TOC Initial Note (Signed)
Transition of Care [facility]) - Initial/Assessment Note   Patient Details  Name: Anthony Hayes MRN: [id number] Date of Birth: 1935-02-15  Transition of Care [facility]) CM/SW Contact:    Sherie Don, LCSW Phone Number: 09/02/2021, 10:25 AM  Clinical Narrative: Patient is an 86 year old male who was admitted for a GI bleed. Readmission checklist completed due to high readmission score.  CSW spoke with patient's daughter, Anthony Hayes, to complete assessment as patient has dementia and is oriented x1. Per daughter, patient resides in the memory care unit at [facility]. Patient is active with [facility] for hospice services and daughter reported she thinks the patient has been receiving hospice services for over a year.  CSW spoke with [name], RN with [facility] hospice. Per [name], patient has been on hospice for over a year and the plan is for the patient to return to [facility] as long as he can manage at that level of care. If patient is able to return to [facility], he will need to be transported via [facility] [phone]) as [facility] has a contract with them. TOC to follow for discharge needs.                 Expected Discharge Plan: Memory Care Barriers to Discharge: Continued Medical Work up  Patient Goals and CMS Choice Patient states their goals for this hospitalization and ongoing recovery are:: Return to [place] with [facility] CMS Medicare.gov Compare Post Acute Care list provided to:: Patient Represenative (must comment) Anthony Hayes (daughter)) Choice offered to / list presented to : Adult Children  Expected Discharge Plan and Services Expected Discharge Plan: Memory Care In-house Referral: Clinical Social Work Post Acute Care Choice: Hospice Living arrangements for the past 2 months: [place] ([facility] memory care unit)            DME Arranged: N/A DME Agency: NA  Prior Living Arrangements/Services Living arrangements for  the past 2 months: [place] ([facility] memory care unit) Lives with:: Facility Resident Patient language and need for interpreter reviewed:: Yes Do you feel safe going back to the place where you live?: Yes      Need for Family Participation in Patient Care: Yes (Comment) (Patient has dementia.) Care giver support system in place?: Yes (comment) Criminal Activity/Legal Involvement Pertinent to Current Situation/Hospitalization: No - Comment as needed  Permission Sought/Granted Permission sought to share information with : Other (comment) Permission granted to share information with : Yes, Verbal Permission Granted Permission granted to share info w AGENCY: [facility]  Emotional Assessment Appearance:: Appears stated age Attitude/Demeanor/Rapport: Unable to Assess Affect (typically observed): Unable to Assess Orientation: : Oriented to Self Alcohol / Substance Use: Not Applicable Psych Involvement: No (comment)  Admission diagnosis:  GI bleed [K92.2] Sepsis, due to unspecified organism, unspecified whether acute organ dysfunction present [facility]) [A41.9] Patient Active Problem List   Diagnosis Date Noted   GI bleed 09/01/2021   Hypoalbuminemia 09/01/2021   Hypotension 09/01/2021   Low bicarbonate 09/01/2021   Altered mental status 01/15/2020   Pneumonia due to COVID-19 virus 01/14/2020   COVID-19 virus infection 12/19/2019   Protein-calorie malnutrition, severe ([place]) 12/19/2019   HCAP (healthcare-associated pneumonia) 11/22/2018   Fall 11/22/2018   Chronic diastolic CHF (congestive heart failure) ([place]) 11/22/2018   Hypoxia    Bradycardia 10/28/2018   Leg swelling 10/28/2018   Irregular heart beat 09/08/2018   Sleep disturbance    Stage 3 chronic kidney disease (  [place])    Benign essential HTN    Hypokalemia    Hyponatremia    Infarction of right basal ganglia ([place]) 04/06/2018   Pressure injury of skin 04/06/2018   Vitamin B12 deficiency    Hypothyroid     Crohn's disease with complication ([place])    Benign prostatic hyperplasia    Acute lower UTI    Dementia without behavioral disturbance ([place])    Acute ischemic stroke ([place]) 04/05/2018   Leukocytosis    Shortness of breath    Acute encephalopathy    CVA (cerebral vascular accident) ([place]) 04/03/2018   Calculus, kidney 01/21/2018   Blood loss anemia    Mallory-Weiss syndrome    Esophageal stricture    Respiratory failure ([place])    UGIB (upper gastrointestinal bleed) 10/16/2017   GERD (gastroesophageal reflux disease) 10/16/2017   Essential hypertension, benign 10/16/2017   Chest pain 10/15/2017   Hypothyroidism (acquired) 06/08/2017   Chronic kidney disease, stage III (moderate) 03/19/2016   Dyslipidemia 03/19/2016   Degeneration of lumbar or lumbosacral intervertebral disc 07/10/2008   Arthropathy, lower leg 05/23/2004   PCP:  Etter Sjogren, DO Pharmacy:   [facility], [place] [address] [place] [address] Phone: [phone] Fax: [phone]  Readmission Risk Interventions Readmission Risk Prevention Plan 09/02/2021  Transportation Screening Complete  HRI or Home Care Consult Complete  Social Work Consult for [place] Planning/Counseling Complete  Palliative Care Screening Not Applicable  Medication Review Press photographer) Complete  Some recent data might be hidden

## 2021-09-03 ENCOUNTER — Other Ambulatory Visit: Payer: Self-pay

## 2021-09-03 DIAGNOSIS — K222 Esophageal obstruction: Secondary | ICD-10-CM

## 2021-09-03 DIAGNOSIS — A419 Sepsis, unspecified organism: Secondary | ICD-10-CM

## 2021-09-03 DIAGNOSIS — R933 Abnormal findings on diagnostic imaging of other parts of digestive tract: Secondary | ICD-10-CM | POA: Diagnosis not present

## 2021-09-03 DIAGNOSIS — K920 Hematemesis: Secondary | ICD-10-CM | POA: Diagnosis not present

## 2021-09-03 DIAGNOSIS — K922 Gastrointestinal hemorrhage, unspecified: Secondary | ICD-10-CM | POA: Diagnosis not present

## 2021-09-03 LAB — BLOOD CULTURE ID PANEL (REFLEXED) - BCID2

## 2021-09-03 LAB — CBC
HCT: 29.4 % — ABNORMAL LOW (ref 39.0–52.0)
Hemoglobin: 10.1 g/dL — ABNORMAL LOW (ref 13.0–17.0)
MCH: 31.2 pg (ref 26.0–34.0)
MCHC: 34.4 g/dL (ref 30.0–36.0)
MCV: 90.7 fL (ref 80.0–100.0)
Platelets: 120 10*3/uL — ABNORMAL LOW (ref 150–400)
RBC: 3.24 MIL/uL — ABNORMAL LOW (ref 4.22–5.81)
RDW: 14.2 % (ref 11.5–15.5)
WBC: 18 10*3/uL — ABNORMAL HIGH (ref 4.0–10.5)
nRBC: 0 % (ref 0.0–0.2)

## 2021-09-03 LAB — CREATININE, SERUM
Creatinine, Ser: 1.05 mg/dL (ref 0.61–1.24)
GFR, Estimated: >60 mL/min (ref >60)

## 2021-09-03 LAB — RENAL FUNCTION PANEL
Albumin: 2.6 g/dL — ABNORMAL LOW (ref 3.5–5.0)
Anion gap: 6 (ref 5–15)
BUN: 30 mg/dL — ABNORMAL HIGH (ref 8–23)
CO2: 20 mmol/L — ABNORMAL LOW (ref 22–32)
Calcium: 8.4 mg/dL — ABNORMAL LOW (ref 8.9–10.3)
Chloride: 109 mmol/L (ref 98–111)
Creatinine, Ser: 1.11 mg/dL (ref 0.61–1.24)
GFR, Estimated: >60 mL/min (ref >60)
Glucose, Bld: 127 mg/dL — ABNORMAL HIGH (ref 70–99)
Phosphorus: 2.6 mg/dL (ref 2.5–4.6)
Potassium: 4.1 mmol/L (ref 3.5–5.1)
Sodium: 135 mmol/L (ref 135–145)

## 2021-09-03 LAB — GLUCOSE, CAPILLARY
Glucose-Capillary: 115 mg/dL — ABNORMAL HIGH (ref 70–99)
Glucose-Capillary: 109 mg/dL — ABNORMAL HIGH (ref 70–99)
Glucose-Capillary: 177 mg/dL — ABNORMAL HIGH (ref 70–99)

## 2021-09-03 LAB — MAGNESIUM: Magnesium: 2.1 mg/dL (ref 1.7–2.4)

## 2021-09-03 LAB — PROCALCITONIN: Procalcitonin: 29.36 ng/mL

## 2021-09-03 MED ORDER — HYDROCORTISONE NA SUCCINATE PF 100 MG IJ SOLR
50.0000 mg | Freq: Every day | INTRAMUSCULAR | Status: DC
  Administered 2021-09-04: 50 mg via INTRAVENOUS
  Filled 2021-09-03: qty 2

## 2021-09-03 MED ORDER — SODIUM CHLORIDE 0.9 % IV SOLN
2.0000 g | INTRAVENOUS | Status: DC
  Administered 2021-09-03 – 2021-09-04 (×2): 2 g via INTRAVENOUS
  Filled 2021-09-03 (×2): qty 20

## 2021-09-03 NOTE — Progress Notes (Signed)
SLP Cancellation Note  Patient Details Name: Anthony Hayes MRN: [id number] DOB: 04-28-1935   Cancelled treatment:       Reason Eval/Treat Not Completed: Other (comment) (note pt for possible endoscopy due to results of esophagram per notes, will follow up)  Kathleen Lime, MS [facility] SLP [place] Office [phone] Pager [phone]  [name] 09/03/2021, 5:19 PM

## 2021-09-03 NOTE — Consult Note (Addendum)
Referring Provider: Jacki Cones Primary Care Physician:  Etter Sjogren, DO Primary Gastroenterologist:  Dr. Ardis Hughs   Reason for Consultation: Esophageal stricture  HPI: Anthony Hayes is a 86 y.o. male with a past medical history of depression, arthritis, hypertension, hypercholesterolemia, hypothyroidism, TIA or CVA on Plavix, dementia, renal disease, rhabdomyolysis, GERD, benign esophageal stricture, upper GI bleed/hematemesis secondary to ulcerative esophagitis and a Mallory-Weiss tear 10/2017. He is wheelchair-bound and was placed under hospice care since he was diagnosed with COVID in 2020. Past colectomy in the 1950s, further details are unclear.   He suffered an unwitnessed fall at [facility] which resulted in a laceration to his forehead and he was transported to [facility] ED on 08/29/2021 for further evaluation.  A head CT was negative and his laceration was treated and he was discharged back to [facility].  He developed altered mental status with hematemesis and bloody diarrhea on 08/31/2021 and he was transported by EMS to [facility] for further evaluation.  In the ED, he was hypotensive with a fever of 101.77F.  Labs showed an elevated WBC count of 15.7.  Hg 7.3 (Hg 13.9 on 08/29/2021). FOBT +. CT of the head showed progressive frontal and left preseptal soft tissue swelling from where he hit his head on 9/2.  He received IV fluids and sepsis protocol was initiated and he was started on IV Vancomycin, cefepime and Flagyl.  CTAP without contrast 9/5 was fairly unrevealing.  No evidence of any upper or lower GI mass or inflammatory/infectious process. He underwent a barium swallow on 09/03/2019 which showed narrowing to the distal esophagus, a small hiatal hernia and mild esophageal dysmotility.   His son Christan is at the bedside. He stated his father started vomiting up blood on Sunday 08/31/2021 but he stated his father did not have any bloody bowel movements. His  appetite is usually good. He chews his food slowly and mostly eats a soft diet without complaints of dysphagia or heartburn. No obvious signs of abdominal pain. His last dose of Plavix was likely on 9/4. Never had a colonoscopy. He has a history of an upper GI bleed in 2018.   He was admitted to the hospital 09/2017 due to having a hematemesis/upper GI bleed.  An EGD 10/16/2017 showed severe ulcerative esophagitis with a benign-appearing peptic stricture with a large blood clot in the esophagus.  Proximal to the stricture was a deep mucosal tear that was 5 cm long with a few blood clots adhering to this area with some pinpoint bruising.  A repeat EGD was done 07/22/2017 which showed evidence of a nonbleeding esophageal ulcer, a small hiatal hernia, multiple gastric polyps and a small amount of residual food was in the stomach.  I spoke to his daughter and POA [name] on the telephone and she stated her father and family members wish to purse an EGD if appropriate due to his recent hematemesis with his known history of an UGI bleed secondary to ulcerative esophagitis and a Mallory Weiss tear. [name] stated if there is a problem in his esophagus which could be treated and would prevent further episodes of vomiting blood she would provide consent for any required endoscopic evaluation.    No further hematemesis episodes since admission. He just past a moderate amount of normal brown formed stools as reported by his RN.  Hg level today up to 10.1. He has not received any blood transfusions since his admission. Last dose of Plavix was at 8:30  am on 9/4 as verified by the SNF staff. He is hemodynamically stable, to transfer out of the ICU today.    Chest/abdomen/pelvic CT without contrast 09/01/2021: 1. No visible source of GI bleeding. 2. Thick walled bladder with perivesicular stranding, please correlate for cystitis. 3. Chronic/senescent changes described above.    Barium swallow with tablet  09/02/2021: Limited examination, as described.  Problem-oriented water-soluble esophagram demonstrating no evidence of esophageal perforation/leak.  Focus of apparent mild to, at most, moderate narrowing within the distal esophagus, which could reflect a focal stricture. Endoscopy may be considered for further evaluation, if clinically appropriate. The esophagus is otherwise normal in caliber.  Mild intermittent esophageal dysmotility with tertiary contractions.  Small sliding hiatal hernia  EGD 10/22/2017 by Dr. Carlean Purl: Benign-appearing esophageal stenosis. Dilated. - Non-bleeding esophageal ulcer. Healing - caused by retching/vomiting last weekend we think. No stigmata - Small hiatal hernia. - Multiple gastric polyps. Diminutive and classic appearance of benign fundic gland polyps - A small amount of food (residue) in the stomach. - The examination was otherwise normal. - No specimens collected.  EGD 10/16/2017 Dr. Ardis Hughs: - Severe ulcerative esophagitis with benign appearing peptic stricture and a long, deep (but not perforated) mucosal tear in the distal esphagus - What appeared to be very thickened esophagus was actually a large lumen filling blood clot.   Past Medical History:  Diagnosis Date   Arthritis    "joints" (10/15/2017)   Benign prostatic hyperplasia    Complication of anesthesia    "last OR in 06/2017 affected him cognitively; hasn't got back to baseline yet" (10/15/2017)   Dementia (HCC)    Depression    GERD (gastroesophageal reflux disease)    High cholesterol    Hypertension    Hypothyroidism    Renal disease    Renal disorder    TIA (transient ischemic attack) early 2000s    Past Surgical History:  Procedure Laterality Date   ABDOMINAL EXPLORATION SURGERY  7/   APPENDECTOMY     CATARACT EXTRACTION W/ INTRAOCULAR LENS  IMPLANT, BILATERAL Bilateral    COLECTOMY  1950s   "thought he had iteilis"   ESOPHAGOGASTRODUODENOSCOPY N/A 10/22/2017    Procedure: ESOPHAGOGASTRODUODENOSCOPY (EGD);  Surgeon: Gatha Mayer, MD;  Location: [facility] ENDOSCOPY;  Service: Endoscopy;  Laterality: N/A;   ESOPHAGOGASTRODUODENOSCOPY (EGD) WITH PROPOFOL N/A 10/16/2017   Procedure: ESOPHAGOGASTRODUODENOSCOPY (EGD) WITH PROPOFOL;  Surgeon: Milus Banister, MD;  Location: [facility] ENDOSCOPY;  Service: Endoscopy;  Laterality: N/A;   FEMUR FRACTURE SURGERY Right 1950s   FRACTURE SURGERY     KNEE ARTHROSCOPY Left    POSTERIOR LUMBAR FUSION     ROTATOR CUFF REPAIR Left    TONSILLECTOMY      Prior to Admission medications   Medication Sig Start Date End Date Taking? Authorizing Provider  Amino Acids-Protein Hydrolys (PRO-STAT) LIQD Take 30 mLs by mouth daily. For wound healing (right buttock) 08/11/21  Yes [provider]  amLODipine (NORVASC) 5 MG tablet Take 1 tablet (5 mg total) by mouth daily. 11/26/18  Yes Caren Griffins, MD  ANTI-DIARRHEAL 2 MG tablet Take 4 mg by mouth as directed. 2 caplets after first loose stool, then 1 caplet after each subsequent loose stool up to 6 doses in 24 hrs. 07/31/21  Yes [provider]  clopidogrel (PLAVIX) 75 MG tablet Take 1 tablet (75 mg total) by mouth daily. 11/29/18  Yes Minus Breeding, MD  LORazepam (ATIVAN) 0.5 MG tablet Take 0.25 mg by mouth every 12 (twelve) hours as  needed for anxiety. 05/28/21  Yes [provider]  Melatonin 3 MG SUBL Place 6 mg under the tongue at bedtime.   Yes [provider]  Nutritional Supplements (CARNATION BREAKFAST ESSENTIALS) PACK Take 1 Package by mouth 2 (two) times daily with a meal. Mix with milk   Yes [provider]  prochlorperazine (COMPAZINE) 10 MG tablet Take 10 mg by mouth every 4 (four) hours as needed for nausea or vomiting.   Yes [provider]  REMEDY PHYTOPLEX ANTIFUNGAL 2 % powder Apply 1 application topically 3 (three) times a week. Jory Sims, Friday 05/27/21  Yes [provider]  sertraline (ZOLOFT) 100 MG tablet  Take 100 mg by mouth daily. Take with Sertraline 50 mg to equal 150 mg 08/17/18  Yes [provider]  sertraline (ZOLOFT) 50 MG tablet Take 50 mg by mouth daily. Take with Sertraline 100 mg to equal 150 mg   Yes [provider]  Skin Protectants, Misc. (DIMETHICONE-ZINC OXIDE) cream Apply 1 application topically every 12 (twelve) hours. Apply to buttocks for redness 06/23/21  Yes [provider]  Amino Acids-Protein Hydrolys (PRO-STAT) LIQD Take 30 mLs by mouth daily at 8 pm. 08/11/21   [provider]  amLODipine (NORVASC) 5 MG tablet Take 5 mg by mouth in the morning. 08/13/21   [provider]  clopidogrel (PLAVIX) 75 MG tablet Take 75 mg by mouth in the morning. 08/13/21   [provider]  cyanocobalamin (,VITAMIN B-12,) 1000 MCG/ML injection Inject 1,000 mcg into the muscle every 30 (thirty) days.    [provider]  levothyroxine (SYNTHROID) 50 MCG tablet Take 50 mcg by mouth daily before breakfast.    [provider]  loperamide (IMODIUM A-D) 2 MG tablet Take 2-4 mg by mouth See admin instructions. Take 4 mg by mouth after the 1st loose stool and 2 mg after each subsequent loose stool (max 6 doses/24 hours)    [provider]  LORazepam (ATIVAN) 0.5 MG tablet Take 0.25 mg by mouth every 12 (twelve) hours as needed for anxiety (or agitation).    [provider]  melatonin 3 MG TABS tablet Take 6 mg by mouth at bedtime.    [provider]  Nutritional Supplements (CARNATION INSTANT BREAKFAST PO) Take 1 packet by mouth See admin instructions. Mix 1 packet into 8 ounces of milk and drink 2 times a day    [provider]  pantoprazole (PROTONIX) 40 MG tablet Take 1 tablet (40 mg total) by mouth daily. Patient not taking: Reported on 09/01/2021 04/05/18   Dhungel, Flonnie Overman, MD  pravastatin (PRAVACHOL) 10 MG tablet Take 1 tablet (10 mg total) by mouth daily. Patient not taking: Reported on 09/01/2021  06/06/18   Frann Rider, NP  prochlorperazine (COMPAZINE) 10 MG tablet Take 10 mg by mouth every 4 (four) hours as needed for nausea or vomiting.    [provider]  REMEDY ANTIFUNGAL 2 % powder Apply 1 application topically See admin instructions. Apply powder to rash to the posterior of right thigh and surrounding buttock on Mon/Wed/Fri    [provider]  sertraline (ZOLOFT) 100 MG tablet Take 100 mg by mouth in the morning.    [provider]  sertraline (ZOLOFT) 50 MG tablet Take 50 mg by mouth in the morning.    [provider]  Skin Protectants, Misc. (BAZA PROTECT EX) Apply 1 application topically See admin instructions. Apply to creases of buttocks every 12 hours    [provider]    Current Facility-Administered Medications  Medication Dose Route Frequency Provider Last Rate Last Admin   0.9 %  sodium chloride infusion   Intravenous PRN Mercy Riding, MD   Stopped at 09/02/21 1824   acetaminophen (TYLENOL) tablet 650 mg  650 mg Oral Q6H PRN Zierle-Ghosh, Asia B, DO       Or   acetaminophen (TYLENOL) suppository 650 mg  650 mg Rectal Q6H PRN Zierle-Ghosh, Asia B, DO       ceFEPIme (MAXIPIME) 2 g in sodium chloride 0.9 % 100 mL IVPB  2 g Intravenous Q12H Royetta Asal, RPH   Stopped at 09/03/21 1113   Chlorhexidine Gluconate Cloth 2 % PADS 6 each  6 each Topical Q0600 Zierle-Ghosh, Asia B, DO   6 each at 09/03/21 0602   dextrose 5 % and 0.9 % NaCl with KCl 20 mEq/L infusion   Intravenous Continuous Mercy Riding, MD   Stopped at 09/03/21 1034   hydrocortisone sodium succinate (SOLU-CORTEF) 100 MG injection 50 mg  50 mg Intravenous Q12H Wendee Beavers T, MD   50 mg at 09/03/21 0601   hydrogen peroxide 3 % external solution   Topical Once PRN Gonfa, Taye T, MD       lactated ringers bolus 250 mL  250 mL Intravenous Once Zierle-Ghosh, Asia B, DO       MEDLINE mouth rinse  15 mL Mouth Rinse BID Cyndia Skeeters, Taye T, MD   15 mL at 09/03/21 1033    metroNIDAZOLE (FLAGYL) IVPB 500 mg  500 mg Intravenous Q12H Zierle-Ghosh, Asia B, DO 100 mL/hr at 09/03/21 1115 500 mg at 09/03/21 1115   morphine 4 MG/ML injection 4 mg  4 mg Intravenous Q3H PRN Zierle-Ghosh, Asia B, DO       ondansetron (ZOFRAN) tablet 4 mg  4 mg Oral Q6H PRN Zierle-Ghosh, Asia B, DO       Or   ondansetron (ZOFRAN) injection 4 mg  4 mg Intravenous Q6H PRN Zierle-Ghosh, Asia B, DO       pantoprazole (PROTONIX) injection 40 mg  40 mg Intravenous Q12H Zierle-Ghosh, Asia B, DO   40 mg at 09/03/21 1033   vancomycin (VANCOREADY) IVPB 750 mg/150 mL  750 mg Intravenous Q24H Minda Ditto, RPH   Stopped at 09/02/21 1344    Allergies as of 08/31/2021 - Review Complete 08/31/2021  Allergen Reaction Noted   Lisinopril Swelling 10/01/2015   Lisinopril Other (See Comments) 08/29/2021    Family History  Problem Relation Age of Onset   Hypertension Father     Social History   Socioeconomic History   Marital status: Widowed    Spouse name: Not on file   Number of children: Not on file   Years of education: Not on file   Highest education level: Not on file  Occupational History   Not on file  Tobacco Use   Smoking status: Former    Years: 7.00    Types: Cigarettes    Quit date: 1963    Years since quitting: 59.7   Smokeless tobacco: Never  Vaping Use   Vaping Use: Never used  Substance and Sexual Activity   Alcohol use: Yes    Comment: 10/15/2017 "a few drinks/year"   Drug use: No   Sexual activity: Not on file  Other Topics Concern   Not on file  Social History Narrative   Not on file   Social Determinants of Health   Financial Resource Strain: Not on file  Food Insecurity: Not on file  Transportation Needs: Not on file  Physical Activity: Not on file  Stress: Not on file  Social Connections: Not on file  Intimate Partner Violence: Not on file    Review of Systems:  See HPI, all other systems reviewed and are negative   Physical Exam: Vital signs in  last 24 hours: Temp:  [96 F (35.6 C)-98.3 F (36.8 C)] 97.9 F (36.6 C) (09/07 0800) Pulse Rate:  [34-51] 41 (09/07 1100) Resp:  [11-21] 20 (09/07 1100) BP: (118-194)/(40-108) 180/72 (09/07 1100) SpO2:  [94 %-100 %] 100 % (09/07 1100) Last BM Date:  (pta) General:  86 year old  male HOH in NAD. Forehead with laceration/ staples intact.  Head:  Normocephalic and atraumatic. Eyes:  No scleral icterus. Conjunctiva pink. Ears:  Normal auditory acuity. Nose:  No deformity, discharge or lesions. Mouth:  Dentition intact. No ulcers or lesions.  Neck:  Supple. No lymphadenopathy or thyromegaly.  Lungs:  Breath sounds clear throughout. Heart:  RRR, no murmur.  Abdomen:  Soft, nondistended. Nontender. Central midline abdominal scar intact.  Rectal: Deferred. Musculoskeletal:  Symmetrical without gross deformities.  Pulses:  Normal pulses noted. Extremities:  Scattered ecchymotic patches to upper extremities. Without clubbing or edema. Neurologic:  Alert and  oriented x 2. No focal deficits.  Skin:  Forehead with laceration/staples and ecchymosis.  Psych:  Alert and cooperative. Normal mood and affect.  Intake/Output from previous day: 09/06 0701 - 09/07 0700 In: 2037 [I.V.:1345; IV Piggyback:692.1] Out: 1625 [Urine:1625] Intake/Output this shift: Total I/O In: 702.1 [P.O.:240; I.V.:456.7; IV Piggyback:5.4] Out: 600 [Urine:600]  Lab Results: Recent Labs    09/01/21 0706 09/01/21 1300 09/02/21 0042 09/02/21 0555 09/02/21 1311 09/03/21 0239  WBC 28.6*  --  16.9*  --   --  18.0*  HGB 9.6*   < > 8.9* 8.5* 9.4* 10.1*  HCT 27.5*   < > 26.1* 24.6* 27.8* 29.4*  PLT 163  --  121*  --   --  120*   < > = values in this interval not displayed.   BMET Recent Labs    09/01/21 0556 09/02/21 0042 09/03/21 0239  NA 138 137 135  K 3.7 3.8 4.1  CL 109 111 109  CO2 20* 19* 20*  GLUCOSE 85 114* 127*  BUN 29* 29* 30*  CREATININE 1.33* 1.29* 1.05  1.11  CALCIUM 8.6* 8.5* 8.4*    LFT Recent Labs    09/01/21 0556 09/02/21 0042 09/03/21 0239  PROT 5.7*  --   --   ALBUMIN 2.8*   < > 2.6*  AST 41  --   --   ALT 20  --   --   ALKPHOS 90  --   --   BILITOT 0.9  --   --    < > = values in this interval not displayed.   PT/INR Recent Labs    08/31/21 2300 09/01/21 0556  LABPROT 19.2* 15.4*  INR 1.6* 1.2   Hepatitis Panel No results for input(s): HEPBSAG, HCVAB, HEPAIGM, HEPBIGM in the last 72 hours.    Studies/Results: DG ESOPHAGUS W SINGLE CM (SOL OR THIN BA)  Result Date: 09/02/2021 CLINICAL DATA:  Dysphagia.  Hematemesis. EXAM: ESOPHOGRAM/BARIUM SWALLOW TECHNIQUE: Single contrast examination was performed using thin barium or water soluble. FLUOROSCOPY TIME:  Fluoroscopy Time:  1 minute, 30 seconds Radiation Exposure Index (if provided by the fluoroscopic device): 35.8 mGy Number of Acquired Spot Images: 4 COMPARISON:  CT chest/abdomen/pelvis 09/01/2021. FINDINGS:  Evaluation limited by the patient's inability to stand and limited ability to reposition. There is a focus of apparent narrowing within the distal esophagus which appears mild to, at most, moderate in severity. This was not appreciated during the examination, but was identified on post-procedure review of the esophagram images. The esophagus is otherwise normal in caliber. Mild intermittent esophageal dysmotility with tertiary contractions. Small sliding hiatal hernia. No gastroesophageal reflux was observed. No extraluminal contrast is demonstrated to suggest esophageal perforation/leak. IMPRESSION: Limited examination, as described. Problem-oriented water-soluble esophagram demonstrating no evidence of esophageal perforation/leak. Focus of apparent mild to, at most, moderate narrowing within the distal esophagus, which could reflect a focal stricture. Endoscopy may be considered for further evaluation, if clinically appropriate. The esophagus is otherwise normal in caliber. Mild intermittent esophageal  dysmotility with tertiary contractions. Small sliding hiatal hernia. Electronically Signed   By: Kellie Simmering D.O.   On: 09/02/2021 15:40    IMPRESSION/PLAN:  12) 86 year old male with a history of GERD, ulcerative esophagitis, upper GI bleed/Mallory-Weiss tear in 2018 admitted to the hospital 08/31/2021 with altered mental status, hematemesis and anemia while on Plavix.  Hemoglobin 13.9 on 08/29/2021 -> 7.3 on 08/31/2021 -> today 10.1 without receiving any blood transfusions. + FOBT. No overt GI bleeding. Today Hg 10.1. Barium swallow 9/6 identified narrowing of the distal esophagus and mild esophageal dysmotility. CTAP unrevealing.  -Monitor H/H closely  -Transfuse for Hg < 8 -Continue to hold Plavix -Continue Pantoprazole 40 mg IV twice daily -Defer decision regarding endoscopic evaluation to Dr. Silverio Decamp. Daughter and POA [name] provided consent for EGD if pursued.  -Diet as tolerated   2) Dementia with acute metabolic encephalopathy  3) SIRS/hypotension, improving   4) History of CVA or TIA on Plavix. Last dose of Plavix was 9/4 at 8am. -Continue to hold Plavix   5) On Hospice since he had Covid pneumonia 2020   Noralyn Pick  09/03/2021, 12:21 PM    Attending physician's note   I have taken a history, examined the patient and reviewed the chart. I agree with the Advanced Practitioner's note, impression and recommendations.  86 year old gentleman with history of dementia, multiple comorbidities, CVA on chronic Plavix admitted with altered mental status. Had episode of hematemesis and diarrhea on September 4 He has not had any further episodes of hematemesis since admission  Hemoglobin has remained stable, slightly improved in the last 2 days  He has known peptic stricture underwent EGD in 2018  Barium esophagram yesterday showed mild esophageal stricture otherwise no significant mucosal abnormality EGD will likely be low yield and unlikely to have significant benefit from  esophageal dilation, given he did not have any esophageal stasis to suggest severe stenosis.  Continue with dietary modifications as needed, consider repeat swallow evaluation with speech and swallow therapist  Okay to restart Plavix from GI standpoint  GI will sign off, available if have any questions  Discussed above plan with family (son and daughter) at bedside The family was provided an opportunity to ask questions and all were answered. They agreed with the plan and demonstrated an understanding of the instructions.  Damaris Hippo , MD [phone]

## 2021-09-03 NOTE — Progress Notes (Signed)
Pt is oriented to self. Pt vitals in chart with heart rate bradycardia, MD notified. Family doesnot wanted to any further intervention on it. Pt has staple and sutures on fore head. Pt skin is all bruised .,abrasion and ecchymosis.will continue monitor the pt.

## 2021-09-03 NOTE — Progress Notes (Signed)
Occupational Therapy Evaluation  Patient from ALF memory care facility. Son present to provide history as patient has dementia. Staff provide 2 person assist to wheelchair at baseline with patient able to bear some weight in lower extremities. Has assist for BADLs, can feed himself. Patient appears at baseline needing max A x2 for bed mobility and total A for perianal care due to loose watery diarrhea in bed (son states this is normal). No further acute OT needs at this time, will sign off.     09/03/21 1200  OT Visit Information  Last OT Received On 09/03/21  Assistance Needed +2  History of Present Illness Patient is an 86 year old male admitted from ALF memory care unit after fall out of wheelchair sustaining head and forearm lac. Patient also diagnosed with Acute blood loss anemia due to upper GI bleed-presents with hematemesis. PMH includes dementia on hospice, HTN, hypothyroidism, BPH, GERD, esophageal tear, and osteoarthritis  Precautions  Precautions Fall  Precaution Comments loose diarrhea  Restrictions  Weight Bearing Restrictions No  Home Living  Family/patient expects to be discharged to: Assisted living  Prior Function  Level of Independence Needs assistance  Gait / Transfers Assistance Needed son present, reports staff would two person transfer patient to a wheelchair, patient able to bear some weight through legs  ADL's / [place] can feed himself, otherwise assist for dressing, bathing, toileting. son reports frequent loose diarrhea due to "doesn't have much of his intestine"  Communication  Communication No difficulties  Pain Assessment  Pain Assessment Faces  Faces Pain Scale 4  Pain Location generalized with bed mobility  Pain Descriptors / Indicators Moaning  Pain Intervention(s) Monitored during session;Limited activity within patient's tolerance  Cognition  Arousal/Alertness Awake/alert  Behavior During Therapy WFL for tasks assessed/performed   Overall Cognitive Status History of cognitive impairments - at baseline  Upper Extremity Assessment  Upper Extremity Assessment RUE deficits/detail;LUE deficits/detail  RUE Deficits / Details bilateral shoulder surgeries with limited shoulder AROM, able to bring hand to mouth with B UEs  Lower Extremity Assessment  Lower Extremity Assessment Defer to PT evaluation  ADL  Overall ADL's  At baseline  General ADL Comments patient needing total assistance for perianal care at bed level due to loose/liquid diarrhea  Bed Mobility  Overal bed mobility Needs Assistance  Bed Mobility Rolling  Rolling Max assist;+2 for physical assistance;+2 for safety/equipment  General bed mobility comments patient attempts to turn when directed but needing max x1-2 to complete rolling in bed for peri care  Transfers  General transfer comment deferred, would need hoyer lift while in hospital for out of chair. has significant assist +2 at baseline from care facility for transfers  Balance  Overall balance assessment History of Falls  OT - End of Session  Activity Tolerance Patient tolerated treatment well  Patient left in bed;with call bell/phone within reach;with bed alarm set;with family/visitor present  Nurse Communication Mobility status  OT Assessment  OT Recommendation/Assessment Patient does not need any further OT services  OT Visit Diagnosis History of falling (Z91.81)  OT Problem List Pain  AM-PAC OT "6 Clicks" Daily Activity Outcome Measure (Version 2)  Help from another person eating meals? 3  Help from another person taking care of personal grooming? 2  Help from another person toileting, which includes using toliet, bedpan, or urinal? 1  Help from another person bathing (including washing, rinsing, drying)? 1  Help from another person to put on and taking off regular upper body  clothing? 1  Help from another person to put on and taking off regular lower body clothing? 1  6 Click Score 9   Progressive Mobility  What is the highest level of mobility based on the progressive mobility assessment? Level 1 (Bedfast) - Unable to balance while sitting on edge of bed  Mobility Sit up in bed/chair position for meals  OT Recommendation  Follow Up Recommendations Other (comment) (return to care facility)  OT Equipment None recommended by OT  Acute Rehab OT Goals  Patient Stated Goal did not state  OT Goal Formulation All assessment and education complete, DC therapy  OT Time Calculation  OT Start Time (ACUTE ONLY) 1146  OT Stop Time (ACUTE ONLY) 1219  OT Time Calculation (min) 33 min  OT General Charges  $OT Visit 1 Visit  OT Evaluation  $OT Eval Low Complexity 1 Low  Written Expression  Dominant Hand  (did not specify)   Anthony Hayes OT OT pager: [phone]

## 2021-09-03 NOTE — Progress Notes (Signed)
PHARMACY - PHYSICIAN COMMUNICATION CRITICAL VALUE ALERT - BLOOD CULTURE IDENTIFICATION (BCID)  Anthony Hayes is an 86 y.o. male who presented to [facility] on 08/31/2021 with a chief complaint of GI bleed. Also noted to have sepsis with unknown source  Assessment:  1/6 BCx bottles growing Proteus spp; possible sources include urinary (although UCx negative) or GI  Name of physician (or Provider) Contacted: [name]  Current antibiotics: Cefepime, Flagyl  Changes to prescribed antibiotics recommended: narrow to Rocephin 2g q24 Recommendations accepted by provider  Results for orders placed or performed during the hospital encounter of 08/31/21  Blood Culture ID Panel (Reflexed) (Collected: 08/31/2021 11:17 PM)  Result Value Ref Range   Enterococcus faecalis NOT DETECTED NOT DETECTED   Enterococcus Faecium NOT DETECTED NOT DETECTED   Listeria monocytogenes NOT DETECTED NOT DETECTED   Staphylococcus species NOT DETECTED NOT DETECTED   Staphylococcus aureus (BCID) NOT DETECTED NOT DETECTED   Staphylococcus epidermidis NOT DETECTED NOT DETECTED   Staphylococcus lugdunensis NOT DETECTED NOT DETECTED   Streptococcus species NOT DETECTED NOT DETECTED   Streptococcus agalactiae NOT DETECTED NOT DETECTED   Streptococcus pneumoniae NOT DETECTED NOT DETECTED   Streptococcus pyogenes NOT DETECTED NOT DETECTED   A.calcoaceticus-baumannii NOT DETECTED NOT DETECTED   Bacteroides fragilis NOT DETECTED NOT DETECTED   Enterobacterales DETECTED (A) NOT DETECTED   Enterobacter cloacae complex NOT DETECTED NOT DETECTED   Escherichia coli NOT DETECTED NOT DETECTED   Klebsiella aerogenes NOT DETECTED NOT DETECTED   Klebsiella oxytoca NOT DETECTED NOT DETECTED   Klebsiella pneumoniae NOT DETECTED NOT DETECTED   Proteus species DETECTED (A) NOT DETECTED   Salmonella species NOT DETECTED NOT DETECTED   Serratia marcescens NOT DETECTED NOT DETECTED   Haemophilus influenzae NOT DETECTED NOT DETECTED   Neisseria  meningitidis NOT DETECTED NOT DETECTED   Pseudomonas aeruginosa NOT DETECTED NOT DETECTED   Stenotrophomonas maltophilia NOT DETECTED NOT DETECTED   Candida albicans NOT DETECTED NOT DETECTED   Candida auris NOT DETECTED NOT DETECTED   Candida glabrata NOT DETECTED NOT DETECTED   Candida krusei NOT DETECTED NOT DETECTED   Candida parapsilosis NOT DETECTED NOT DETECTED   Candida tropicalis NOT DETECTED NOT DETECTED   Cryptococcus neoformans/gattii NOT DETECTED NOT DETECTED   CTX-M ESBL NOT DETECTED NOT DETECTED   Carbapenem resistance IMP NOT DETECTED NOT DETECTED   Carbapenem resistance KPC NOT DETECTED NOT DETECTED   Carbapenem resistance NDM NOT DETECTED NOT DETECTED   Carbapenem resist OXA 48 LIKE NOT DETECTED NOT DETECTED   Carbapenem resistance VIM NOT DETECTED NOT DETECTED    Rozanne Heumann A 09/03/2021  5:34 PM

## 2021-09-03 NOTE — Progress Notes (Signed)
PROGRESS NOTE    Anthony Hayes  YDX:[id number] DOB: 02-Aug-1935 DOA: 08/31/2021 PCP: Etter Sjogren, DO   Brief Narrative: 86 year old M with history of dementia on hospice, HTN, hypothyroidism, BPH, GERD, esophageal tear, and osteoarthritis brought to ED by EMS with concern for hematemesis, hematochezia and mental status change, and admitted for ABLA from acute GI bleed, SIRS and hypotension.  He was febrile with leukocytosis, hypotension and lactic acidosis.  Hgb 7.3 (baseline 13 about 3 days prior).  CXR and CT head without significant finding other than frontal and left preseptal soft tissue swelling.  CT abdomen and pelvis raises concern for cystitis.  UA with moderate Hgb, LE and rare bacteria.  Cultures obtained.  Patient was started on IV fluid and broad-spectrum antibiotics, and admitted.  Per admitting provider, family not interested in blood transfusion, ACLS meds or invasive procedures such as endoscopy or colonoscopy as of now.    Blood and urine cultures negative.  Blood pressure improved but bradycardic to 30s and 40s.  He is not symptomatic from this.  Per family, baseline heart rate is in 48s.  Remains on broad-spectrum antibiotics until cultures are negative for 48 hours.  Family not interested in escalation of care.  Follow esophagram.  Assessment & Plan:   Active Problems:   Blood loss anemia   Acute encephalopathy   GI bleed   Hypoalbuminemia   Hypotension   Low bicarbonate    #1 acute blood loss anemia due to upper GI bleed and presented with hematemesis has a history of esophageal ulcer and GERD in the past. GI consulted since family now wanting to pursue endoscopy.  Continue IV Protonix. Continue IV fluids. Plavix stopped. Esophagogram-concerning for stricture  #2 sepsis poa-unclear source. On broad-spectrum antibiotics Vanco cefepime and Flagyl. MRSA PCR negative, DC vancomycin.  upon admission he was febrile with leukocytosis lactic acidosis and hypotension and  unclear source.  Cultures negative.  #3 bradycardia avoid nodal blocking agents Patient asymptomatic. EKG with sinus bradycardia no acute findings.  #4 AKI likely from hypotension/infectious process possible, CT abdomen no obstruction.  #5 acute metabolic encephalopathy likely multifactorial.  CT head with frontal soft tissue swelling from recent fall and trauma.  #6 hypothyroidism on IV Synthroid  #7 recurrent falls at the nursing home patient had a fall with head injury with 3 days prior to admission to the hospital.  Forehead laceration has been repaired with staples and stitches on 08/29/2021.  #8 goals of care patient's CODE STATUS is DNR/DNI. Family is not interested in aggressive work-up blood transfusion or ACLS medications or invasive procedures, however they are okay with proceeding with endoscopy.   Pressure Injury 04/05/18 Stage I -  Intact skin with non-blanchable redness of a localized area usually over a bony prominence. (Active)  04/05/18 0800  Location: Buttocks  Location Orientation:   Staging: Stage I -  Intact skin with non-blanchable redness of a localized area usually over a bony prominence.  Wound Description (Comments):   Present on Admission:      Pressure Injury 12/20/19 Buttocks Left Stage 1 -  Intact skin with non-blanchable redness of a localized area usually over a bony prominence. (Active)  12/20/19 0236  Location: Buttocks  Location Orientation: Left  Staging: Stage 1 -  Intact skin with non-blanchable redness of a localized area usually over a bony prominence.  Wound Description (Comments):   Present on Admission:     Estimated body mass index is 21.49 kg/m as calculated from the following:  Height as of this encounter: 5' 6"  (1.676 m).   Weight as of this encounter: 60.4 kg.  DVT prophylaxis: SCD  code Status: DO NOT RESUSCITATE Family Communication: Discussed with daughter [name] over the phone  disposition Plan:  Status is:  Inpatient  Remains inpatient appropriate because:Hemodynamically unstable  Dispo: The patient is from: Home              Anticipated d/c is to: Home              Patient currently is not medically stable to d/c.   Difficult to place patient No       Consultants:  GI  Procedures:none Antimicrobials:   Subjective:  Patient resting in bed staff reports no overnight events he is extremely hard of hearing Objective: Vitals:   09/03/21 0900 09/03/21 1000 09/03/21 1100 09/03/21 1330  BP: (!) 152/69 (!) 129/99 (!) 180/72 (!) 151/74  Pulse: (!) 42 (!) 37 (!) 41 (!) 43  Resp: 18 17 20 18   Temp:    97.7 F (36.5 C)  TempSrc:    Axillary  SpO2: 95% 97% 100% 100%  Weight:      Height:        Intake/Output Summary (Last 24 hours) at 09/03/2021 1404 Last data filed at 09/03/2021 1228 Gross per 24 hour  Intake 2961.33 ml  Output 1625 ml  Net 1336.33 ml   Filed Weights   08/31/21 2009 09/01/21 0600  Weight: 66 kg 60.4 kg    Examination:  General exam: Appears calm and comfortable  Respiratory system: Clear to auscultation. Respiratory effort normal. Cardiovascular system: S1 & S2 heard, RRR. No JVD, murmurs, rubs, gallops or clicks. No pedal edema. Gastrointestinal system: Abdomen is nondistended, soft and nontender. No organomegaly or masses felt. Normal bowel sounds heard. Central nervous system: Alert and oriented. No focal neurological deficits. Extremities: Symmetric 5 x 5 power. Skin: No rashes, lesions or ulcers Psychiatry: Judgement and insight appear normal. Mood & affect appropriate.     Data Reviewed: I have personally reviewed following labs and imaging studies  CBC: Recent Labs  Lab 08/29/21 1848 08/31/21 2300 09/01/21 0706 09/01/21 1300 09/01/21 1755 09/02/21 0042 09/02/21 0555 09/02/21 1311 09/03/21 0239  WBC 9.3 15.7* 28.6*  --   --  16.9*  --   --  18.0*  NEUTROABS 6.6 15.0*  --   --   --   --   --   --   --   HGB 13.9 7.3* 9.6*   < > 8.3* 8.9*  8.5* 9.4* 10.1*  HCT 41.0 22.6* 27.5*   < > 23.8* 26.1* 24.6* 27.8* 29.4*  MCV 90.9 96.6 89.6  --   --  90.6  --   --  90.7  PLT 298 137* 163  --   --  121*  --   --  120*   < > = values in this interval not displayed.   Basic Metabolic Panel: Recent Labs  Lab 08/29/21 1848 08/31/21 2300 09/01/21 0556 09/02/21 0042 09/03/21 0239  NA 138 134* 138 137 135  K 4.0 3.6 3.7 3.8 4.1  CL 105 106 109 111 109  CO2 24 16* 20* 19* 20*  GLUCOSE 95 67* 85 114* 127*  BUN 31* 21 29* 29* 30*  CREATININE 1.12 0.80 1.33* 1.29* 1.05  1.11  CALCIUM 9.7 7.4* 8.6* 8.5* 8.4*  MG  --   --   --  1.5* 2.1  PHOS  --   --   --  3.3 2.6   GFR: Estimated Creatinine Clearance: 44.7 mL/min (by C-G formula based on SCr of 1.05 mg/dL). Liver Function Tests: Recent Labs  Lab 08/29/21 1848 08/31/21 2300 09/01/21 0556 09/02/21 0042 09/03/21 0239  AST 27 30 41  --   --   ALT 16 15 20   --   --   ALKPHOS 120 70 90  --   --   BILITOT 0.9 0.5 0.9  --   --   PROT 7.6 3.6* 5.7*  --   --   ALBUMIN 3.8 1.8* 2.8* 2.6* 2.6*   No results for input(s): LIPASE, AMYLASE in the last 168 hours. No results for input(s): AMMONIA in the last 168 hours. Coagulation Profile: Recent Labs  Lab 08/29/21 1848 08/31/21 2300 09/01/21 0556  INR 1.1 1.6* 1.2   Cardiac Enzymes: Recent Labs  Lab 08/29/21 1848 09/01/21 0415  CKTOTAL 69 317   BNP (last 3 results) No results for input(s): PROBNP in the last 8760 hours. HbA1C: No results for input(s): HGBA1C in the last 72 hours. CBG: Recent Labs  Lab 09/02/21 0759 09/03/21 0851  GLUCAP 107* 115*   Lipid Profile: No results for input(s): CHOL, HDL, LDLCALC, TRIG, CHOLHDL, LDLDIRECT in the last 72 hours. Thyroid Function Tests: No results for input(s): TSH, T4TOTAL, FREET4, T3FREE, THYROIDAB in the last 72 hours. Anemia Panel: No results for input(s): VITAMINB12, FOLATE, FERRITIN, TIBC, IRON, RETICCTPCT in the last 72 hours. Sepsis Labs: Recent Labs  Lab  09/01/21 0400 09/01/21 0415 09/01/21 0547 09/01/21 0556 09/02/21 0042 09/03/21 0239  PROCALCITON 72.40  --   --  79.54 55.45 29.36  LATICACIDVEN  --  3.2* 2.5*  --   --   --     Recent Results (from the past 240 hour(s))  Culture, blood (single)     Status: None (Preliminary result)   Collection Time: 08/31/21  9:31 PM   Specimen: BLOOD  Result Value Ref Range Status   Specimen Description   Final    BLOOD BLOOD RIGHT FOREARM Performed at [facility], [place] [address] [place], [place] [address]    Special Requests   Final    BOTTLES DRAWN AEROBIC AND ANAEROBIC Blood Culture adequate volume Performed at [place] [address] [place], [place] [address]    Culture   Final    NO GROWTH 2 DAYS Performed at [place] [address], [place], [place] [address]    Report Status PENDING  Incomplete  Resp Panel by RT-PCR (Flu A&B, Covid) Nasopharyngeal Swab     Status: None   Collection Time: 08/31/21  9:53 PM   Specimen: Nasopharyngeal Swab; Nasopharyngeal(NP) swabs in vial transport medium  Result Value Ref Range Status   SARS Coronavirus 2 by RT PCR NEGATIVE NEGATIVE Final    Comment: (NOTE) SARS-CoV-2 target nucleic acids are NOT DETECTED.  The SARS-CoV-2 RNA is generally detectable in upper respiratory specimens during the acute phase of infection. The lowest concentration of SARS-CoV-2 viral copies this assay can detect is 138 copies/mL. A negative result does not preclude SARS-Cov-2 infection and should not be used as the sole basis for treatment or other patient management decisions. A negative result may occur with  improper specimen collection/handling, submission of specimen other than nasopharyngeal swab, presence of viral mutation(s) within the areas targeted by this assay, and inadequate number of viral copies(<138 copies/mL). A negative result must be combined with clinical observations, patient history, and  epidemiological information. The expected  result is Negative.  Fact Sheet for Patients:  EntrepreneurPulse.com.au  Fact Sheet for Healthcare Providers:  IncredibleEmployment.be  This test is no t yet approved or cleared by the [place] FDA and  has been authorized for detection and/or diagnosis of SARS-CoV-2 by FDA under an Emergency Use Authorization (EUA). This EUA will remain  in effect (meaning this test can be used) for the duration of the COVID-19 declaration under Section 564(b)(1) of the Act, 21 U.S.C.section 360bbb-3(b)(1), unless the authorization is terminated  or revoked sooner.       Influenza A by PCR NEGATIVE NEGATIVE Final   Influenza B by PCR NEGATIVE NEGATIVE Final    Comment: (NOTE) The Xpert Xpress SARS-CoV-2/FLU/RSV plus assay is intended as an aid in the diagnosis of influenza from Nasopharyngeal swab specimens and should not be used as a sole basis for treatment. Nasal washings and aspirates are unacceptable for Xpert Xpress SARS-CoV-2/FLU/RSV testing.  Fact Sheet for Patients: EntrepreneurPulse.com.au  Fact Sheet for Healthcare Providers: IncredibleEmployment.be  This test is not yet approved or cleared by the [place] FDA and has been authorized for detection and/or diagnosis of SARS-CoV-2 by FDA under an Emergency Use Authorization (EUA). This EUA will remain in effect (meaning this test can be used) for the duration of the COVID-19 declaration under Section 564(b)(1) of the Act, 21 U.S.C. section 360bbb-3(b)(1), unless the authorization is terminated or revoked.  Performed at [facility], [place] [address], [place], [place] [address]   Blood Culture (routine x 2)     Status: None (Preliminary result)   Collection Time: 08/31/21 11:17 PM   Specimen: BLOOD  Result Value Ref Range Status   Specimen Description   Final    BLOOD RIGHT  ANTECUBITAL Performed at [place] [address] [place], [place] [address]    Special Requests   Final    BOTTLES DRAWN AEROBIC AND ANAEROBIC Blood Culture adequate volume Performed at [place] [address], [place], [place] [address]    Culture  Setup Time   Final    GRAM NEGATIVE RODS AEROBIC BOTTLE ONLY Organism ID to follow    Culture   Final    NO GROWTH 2 DAYS Performed at [facility], [place] [address], [place], [place] [address]    Report Status PENDING  Incomplete  MRSA Next Gen by PCR, Nasal     Status: None   Collection Time: 09/01/21  5:45 AM   Specimen: Nasal Mucosa; Nasal Swab  Result Value Ref Range Status   MRSA by PCR Next Gen NOT DETECTED NOT DETECTED Final    Comment: (NOTE) The GeneXpert MRSA Assay (FDA approved for NASAL specimens only), is one component of a comprehensive MRSA colonization surveillance program. It is not intended to diagnose MRSA infection nor to guide or monitor treatment for MRSA infections. Test performance is not FDA approved in patients less than 59 years old. Performed at [facility], [place] [address], [place], [place] [address]   Blood Culture (routine x 2)     Status: None (Preliminary result)   Collection Time: 09/01/21  5:47 AM   Specimen: BLOOD  Result Value Ref Range Status   Specimen Description   Final    BLOOD BLOOD LEFT FOREARM Performed at [place] [address], [place], [place] [address]    Special Requests   Final    BOTTLES DRAWN AEROBIC AND ANAEROBIC Blood Culture adequate volume Performed at  [facility], [place] [address], [place], [place] [address]    Culture   Final    NO GROWTH 2 DAYS Performed at [facility] [address], [place], [place] [address]    Report Status PENDING  Incomplete  Urine Culture     Status: None   Collection Time: 09/01/21  7:53 AM   Specimen: Urine,  Catheterized  Result Value Ref Range Status   Specimen Description   Final    URINE, CATHETERIZED Performed at [place] [address] [place], [place] [address]    Special Requests   Final    NONE Performed at [facility], [place] [address] [place], [place] [address]    Culture   Final    NO GROWTH Performed at [facility], [place] [address] [place],  [address]    Report Status 09/02/2021 FINAL  Final         Radiology Studies: DG ESOPHAGUS W SINGLE CM (SOL OR THIN BA)  Result Date: 09/02/2021 CLINICAL DATA:  Dysphagia.  Hematemesis. EXAM: ESOPHOGRAM/BARIUM SWALLOW TECHNIQUE: Single contrast examination was performed using thin barium or water soluble. FLUOROSCOPY TIME:  Fluoroscopy Time:  1 minute, 30 seconds Radiation Exposure Index (if provided by the fluoroscopic device): 35.8 mGy Number of Acquired Spot Images: 4 COMPARISON:  CT chest/abdomen/pelvis 09/01/2021. FINDINGS: Evaluation limited by the patient's inability to stand and limited ability to reposition. There is a focus of apparent narrowing within the distal esophagus which appears mild to, at most, moderate in severity. This was not appreciated during the examination, but was identified on post-procedure review of the esophagram images. The esophagus is otherwise normal in caliber. Mild intermittent esophageal dysmotility with tertiary contractions. Small sliding hiatal hernia. No gastroesophageal reflux was observed. No extraluminal contrast is demonstrated to suggest esophageal perforation/leak. IMPRESSION: Limited examination, as described. Problem-oriented water-soluble esophagram demonstrating no evidence of esophageal perforation/leak. Focus of apparent mild to, at most, moderate narrowing within the distal esophagus, which could reflect a focal stricture. Endoscopy may be considered for further evaluation, if clinically appropriate. The esophagus is otherwise normal in  caliber. Mild intermittent esophageal dysmotility with tertiary contractions. Small sliding hiatal hernia. Electronically Signed   By: Kellie Simmering D.O.   On: 09/02/2021 15:40        Scheduled Meds:  Chlorhexidine Gluconate Cloth  6 each Topical Q0600   hydrocortisone sod succinate (SOLU-CORTEF) inj  50 mg Intravenous Q12H   mouth rinse  15 mL Mouth Rinse BID   pantoprazole (PROTONIX) IV  40 mg Intravenous Q12H   Continuous Infusions:  sodium chloride Stopped (09/02/21 1824)   ceFEPime (MAXIPIME) IV Stopped (09/03/21 1105)   dextrose 5 % and 0.9 % NaCl with KCl 20 mEq/L 100 mL/hr at 09/03/21 1340   lactated ringers     metronidazole Stopped (09/03/21 1215)   vancomycin Stopped (09/03/21 1340)     LOS: 2 days    Time spent: 66 min   Georgette Shell, MD  09/03/2021, 2:04 PM

## 2021-09-03 NOTE — Evaluation (Signed)
Physical Therapy Evaluation Patient Details Name: Anthony Hayes MRN: [id number] DOB: 14-Nov-1935 Today's Date: 09/03/2021   History of Present Illness  Patient is an 86 year old male admitted from ALF memory care unit after fall out of wheelchair sustaining head and forearm lac. Patient also diagnosed with Acute blood loss anemia due to upper GI bleed-presents with hematemesis. PMH includes dementia on hospice, HTN, hypothyroidism, BPH, GERD, esophageal tear, and osteoarthritis  Clinical Impression  Patient in bed with large amount of loose BM. Patient assisted with washing up, rolling to each side. Patient requires max/total assistance.  Legs are rigid in extension and cross at the feet.  Per family, at baseline, patient was assisted to [facility] daily with 2 person stand pivot transfer at Memory care. Patient spent most of day in [facility]. Unsure if he self propelled.  Patient will benefit from PT to establish  patient able to transfer in order to return to ALF. Currently recommend mechanical lift for OOB activities,    Follow Up Recommendations Home health PT (return to ALF,)    Equipment Recommendations  None recommended by PT    Recommendations for Other Services       Precautions / Restrictions Precautions Precautions: Fall Precaution Comments: loose diarrhea Restrictions Weight Bearing Restrictions: No      Mobility  Bed Mobility Overal bed mobility: Needs Assistance Bed Mobility: Rolling Rolling: +2 for physical assistance;+2 for safety/equipment;Total assist         General bed mobility comments: patient attempts to turn when directed but needing max/total  of-2 to complete rolling in bed for peri care    Transfers                 General transfer comment: deferred, would need hoyer lift while in hospital for out of chair. has significant assist +2 at baseline from care facility for transfers  Ambulation/Gait                Stairs            Wheelchair  Mobility    Modified Rankin (Stroke Patients Only)       Balance Overall balance assessment: History of Falls                                           Pertinent Vitals/Pain Pain Assessment: Faces Faces Pain Scale: Hurts little more Pain Location: generalized with bed mobility Pain Descriptors / Indicators: Moaning Pain Intervention(s): Monitored during session    Home Living Family/patient expects to be discharged to:: Assisted living                      Prior Function Level of Independence: Needs assistance   Gait / Transfers Assistance Needed: son present, reports staff would two person transfer patient to a wheelchair, patient able to bear some weight through legs  ADL's / Homemaking Assistance Needed: can feed himself, otherwise assist for dressing, bathing, toileting. son reports frequent loose diarrhea due to "doesn't have much of his intestine"        Hand Dominance   Dominant Hand:  (did not specify)    Extremity/Trunk Assessment   Upper Extremity Assessment Upper Extremity Assessment: Defer to OT evaluation RUE Deficits / Details: bilateral shoulder surgeries with limited shoulder AROM, able to bring hand to mouth with B UEs    Lower Extremity Assessment Lower Extremity  Assessment: RLE deficits/detail;LLE deficits/detail RLE Deficits / Details: rigid, difficult to atempt bending knees, feet crossed, tightly LLE Deficits / Details: same as right    Cervical / Trunk Assessment Cervical / Trunk Assessment: Other exceptions Cervical / Trunk Exceptions: trunk quite rigid to try to  turn  Communication   Communication: No difficulties  Cognition Arousal/Alertness: Awake/alert Behavior During Therapy: WFL for tasks assessed/performed Overall Cognitive Status: History of cognitive impairments - at baseline                                        General Comments General comments (skin integrity, edema, etc.):  TBA    Exercises     Assessment/Plan    PT Assessment Patient needs continued PT services  PT Problem List Decreased strength;Decreased mobility;Decreased safety awareness;Impaired tone;Decreased range of motion;Decreased knowledge of precautions;Decreased activity tolerance;Decreased cognition;Decreased skin integrity;Decreased balance       PT Treatment Interventions Functional mobility training;Therapeutic activities;Patient/family education;Balance training    PT Goals (Current goals can be found in the Care Plan section)  Acute Rehab PT Goals Patient Stated Goal: did not state PT Goal Formulation: With family Time For Goal Achievement: 09/17/21 Potential to Achieve Goals: Fair    Frequency Min 2X/week   Barriers to discharge        Co-evaluation PT/OT/SLP Co-Evaluation/Treatment: Yes Reason for Co-Treatment: For patient/therapist safety PT goals addressed during session: Mobility/safety with mobility OT goals addressed during session: ADL's and self-care       AM-PAC PT "6 Clicks" Mobility  Outcome Measure Help needed turning from your back to your side while in a flat bed without using bedrails?: Total Help needed moving from lying on your back to sitting on the side of a flat bed without using bedrails?: Total Help needed moving to and from a bed to a chair (including a wheelchair)?: Total Help needed standing up from a chair using your arms (e.g., wheelchair or bedside chair)?: Total Help needed to walk in hospital room?: Total Help needed climbing 3-5 steps with a railing? : Total 6 Click Score: 6    End of Session   Activity Tolerance: Patient tolerated treatment well Patient left: in bed;with call bell/phone within reach;with bed alarm set;with family/visitor present Nurse Communication: Mobility status PT Visit Diagnosis: History of falling (Z91.81)    Time: 5916-3846 PT Time Calculation (min) (ACUTE ONLY): 40 min   Charges:   PT Evaluation $PT  Eval Moderate Complexity: 1 Mod PT Treatments $Therapeutic Activity: 8-22 mins        Tresa Endo PT Acute Rehabilitation Services Pager [phone] Office [phone]   Claretha Cooper 09/03/2021, 3:30 PM

## 2021-09-03 NOTE — Progress Notes (Signed)
[facility] [facility]) hospitalized hospice patient.    This is a current hospice patient with a terminal diagnosis of cerebral atherosclerosis. Patient was transported from [facility] to [place] ED for evaluation of altered mental status, bloody emesis and diarrhea. Family contacted hospice prior to this transfer. Patient was admitted to [facility] on 09/01/2021 with a diagnosis of sepsis and GI bleed. Per Dr. Orpah Melter with [facility], this is a related hospital admission.    Visited at bedside. Son present and spoke with daughter by phone. Patient is alert and more talkative than on previous visits. His head wound is no longer covered and he has staples that are intact. Esophogram shows an esophogeal stricture. Plan is to dilate stricture.   This patient remains inpatient appropriate due to the need of IV fluids and antibiotics. Treatment for esophageal stricture.   VS: 97.7, 151/74, 43, 18, 100%RA I/O: 2037/1625  Abnormal Labs: 09/03/2021 02:39 CO2: 20 (L) Glucose: 127 (H) BUN: 30 (H) Calcium: 8.4 (L) Albumin: 2.6 (L) WBC: 18.0 (H) RBC: 3.24 (L) Hemoglobin: 10.1 (L) HCT: 29.4 (L) Platelets: 120 (L)  Diagnostics  DG Esophagus  IMPRESSION: Limited examination, as described. Problem-oriented water-soluble esophagram demonstrating no evidence of esophageal perforation/leak. Focus of apparent mild to, at most, moderate narrowing within the distal esophagus, which could reflect a focal stricture. Endoscopy may be considered for further evaluation, if clinically appropriate. The esophagus is otherwise normal in caliber. Mild intermittent esophageal dysmotility with tertiary contractions. Small sliding hiatal hernia.  IV/PRN Meds: Maxipime 2g IVPB BID, Flagyl 530m IVPB BID, Vancomycin 7531mIVPB QD, D5 1/2 NS @ 100hr, no PRNs    Problem List: -Acute blood loss anemia due to upper GI bleed-presents with hematemesis. --Esophageal stricture   -SIRS/hypotension/lactic acidosis-no clear source of infection yet.  Recent history of dental infection. ---Continue broad-spectrum antibiotics with vancomycin, cefepime and Flagyl -Bradycardia: Seems to have junctional rhythm on telemetry monitoring. -Acute metabolic encephalopathy in patient with advanced dementia-multifactorial.  -Metabolic acidosis-likely due to lactic acidosis and dehydration.  -AKI/azotemia -Coagulopathy: Resolved. -Debility/fall at nursing home-patient had a fall with head injury 3 days prior to presentation.  Had forehead laceration that has been repaired with staples   Discharge planning: Return to memory care when stable for discharge as appropriate.   Family Contact: at bedside and by phone   IDG: Updated   GOC: Clear. DNR. Treat the infection but no aggressive interventions such as blood transfusions or colonoscopy/endoscopy.    Should patient need ambulance transport at discharge please use [facility].   Please do not hesitate to call with questions.   Thank you,   MaFarrel GordonRN, [facility] Hospital[facility]ison   [phone]

## 2021-09-04 DIAGNOSIS — K922 Gastrointestinal hemorrhage, unspecified: Secondary | ICD-10-CM | POA: Diagnosis not present

## 2021-09-04 DIAGNOSIS — A419 Sepsis, unspecified organism: Secondary | ICD-10-CM | POA: Diagnosis not present

## 2021-09-04 DIAGNOSIS — A048 Other specified bacterial intestinal infections: Secondary | ICD-10-CM

## 2021-09-04 DIAGNOSIS — R7881 Bacteremia: Secondary | ICD-10-CM

## 2021-09-04 DIAGNOSIS — G9340 Encephalopathy, unspecified: Secondary | ICD-10-CM | POA: Diagnosis not present

## 2021-09-04 DIAGNOSIS — A498 Other bacterial infections of unspecified site: Secondary | ICD-10-CM | POA: Diagnosis not present

## 2021-09-04 DIAGNOSIS — S01111A Laceration without foreign body of right eyelid and periocular area, initial encounter: Secondary | ICD-10-CM

## 2021-09-04 DIAGNOSIS — S0181XA Laceration without foreign body of other part of head, initial encounter: Secondary | ICD-10-CM

## 2021-09-04 DIAGNOSIS — F0150 Vascular dementia without behavioral disturbance: Secondary | ICD-10-CM

## 2021-09-04 DIAGNOSIS — L8942 Pressure ulcer of contiguous site of back, buttock and hip, stage 2: Secondary | ICD-10-CM

## 2021-09-04 LAB — COMPREHENSIVE METABOLIC PANEL
ALT: 14 U/L (ref 0–44)
AST: 24 U/L (ref 15–41)
Albumin: 2.7 g/dL — ABNORMAL LOW (ref 3.5–5.0)
Alkaline Phosphatase: 81 U/L (ref 38–126)
Anion gap: 12 (ref 5–15)
BUN: 17 mg/dL (ref 8–23)
CO2: 20 mmol/L — ABNORMAL LOW (ref 22–32)
Calcium: 8.6 mg/dL — ABNORMAL LOW (ref 8.9–10.3)
Chloride: 104 mmol/L (ref 98–111)
Creatinine, Ser: 1.09 mg/dL (ref 0.61–1.24)
GFR, Estimated: >60 mL/min (ref >60)
Glucose, Bld: 106 mg/dL — ABNORMAL HIGH (ref 70–99)
Potassium: 3.3 mmol/L — ABNORMAL LOW (ref 3.5–5.1)
Sodium: 136 mmol/L (ref 135–145)
Total Bilirubin: 0.7 mg/dL (ref 0.3–1.2)
Total Protein: 5.4 g/dL — ABNORMAL LOW (ref 6.5–8.1)

## 2021-09-04 LAB — URINALYSIS, ROUTINE W REFLEX MICROSCOPIC
Bilirubin Urine: NEGATIVE
Glucose, UA: NEGATIVE mg/dL
Ketones, ur: NEGATIVE mg/dL
Nitrite: NEGATIVE
Protein, ur: NEGATIVE mg/dL
Specific Gravity, Urine: 1.008 (ref 1.005–1.030)
pH: 5 (ref 5.0–8.0)

## 2021-09-04 LAB — CBC
HCT: 30.4 % — ABNORMAL LOW (ref 39.0–52.0)
Hemoglobin: 10.7 g/dL — ABNORMAL LOW (ref 13.0–17.0)
MCH: 31.3 pg (ref 26.0–34.0)
MCHC: 35.2 g/dL (ref 30.0–36.0)
MCV: 88.9 fL (ref 80.0–100.0)
Platelets: 177 10*3/uL (ref 150–400)
RBC: 3.42 MIL/uL — ABNORMAL LOW (ref 4.22–5.81)
RDW: 14 % (ref 11.5–15.5)
WBC: 11.5 10*3/uL — ABNORMAL HIGH (ref 4.0–10.5)
nRBC: 0 % (ref 0.0–0.2)

## 2021-09-04 LAB — CREATININE, SERUM
Creatinine, Ser: 1.06 mg/dL (ref 0.61–1.24)
GFR, Estimated: >60 mL/min (ref >60)

## 2021-09-04 LAB — GLUCOSE, CAPILLARY
Glucose-Capillary: 116 mg/dL — ABNORMAL HIGH (ref 70–99)
Glucose-Capillary: 100 mg/dL — ABNORMAL HIGH (ref 70–99)
Glucose-Capillary: 107 mg/dL — ABNORMAL HIGH (ref 70–99)

## 2021-09-04 LAB — SARS CORONAVIRUS 2 (TAT 6-24 HRS): SARS Coronavirus 2: NEGATIVE

## 2021-09-04 LAB — MAGNESIUM: Magnesium: 1.6 mg/dL — ABNORMAL LOW (ref 1.7–2.4)

## 2021-09-04 MED ORDER — SODIUM CHLORIDE 0.9 % IV SOLN: INTRAVENOUS | Status: DC | PRN

## 2021-09-04 MED ORDER — POTASSIUM CHLORIDE 10 MEQ/100ML IV SOLN
10.0000 meq | INTRAVENOUS | Status: AC
  Administered 2021-09-04 (×4): 10 meq via INTRAVENOUS
  Filled 2021-09-04 (×4): qty 100

## 2021-09-04 NOTE — Progress Notes (Signed)
[facility]) hospitalized hospice patient.    This is a current hospice patient with a terminal diagnosis of cerebral atherosclerosis. Patient was transported from [facility] to [place] ED for evaluation of altered mental status, bloody emesis and diarrhea. Family contacted hospice prior to this transfer. Patient was admitted to [facility] on 09/01/2021 with a diagnosis of sepsis and GI bleed. Per Dr. Orpah Melter with [facility], this is a related hospital admission.    Visited at bedside, son present . Patient had a rough night, very restless and agitated pulling at IVs and tubes. He is alert but was in and out of sleep. His head wound is no longer covered and he has staples that are intact. Esophogram shows an esophogeal stricture. Family does not want to seek aggressive treatment.    This patient remains inpatient appropriate due to the need of IV fluids and antibiotics. Treatment for esophageal stricture.    VS: 97.9, 132/65, 87, 18, 94%RA I/O: 2037/1625   Abnormal Labs: Results for Anthony Hayes, Anthony Hayes (MRN [id number]) as of 09/04/2021 15:28  Ref. Range 09/04/2021 05:05  COMPREHENSIVE METABOLIC PANEL Unknown Rpt (A)  Sodium Latest Ref Range: 135 - 145 mmol/L 136  Potassium Latest Ref Range: 3.5 - 5.1 mmol/L 3.3 (L)  Chloride Latest Ref Range: 98 - 111 mmol/L 104  CO2 Latest Ref Range: 22 - 32 mmol/L 20 (L)  Glucose Latest Ref Range: 70 - 99 mg/dL 106 (H)  BUN Latest Ref Range: 8 - 23 mg/dL 17  Creatinine Latest Ref Range: 0.61 - 1.24 mg/dL 1.09  Calcium Latest Ref Range: 8.9 - 10.3 mg/dL 8.6 (L)  Anion gap Latest Ref Range: 5 - 15  12  Alkaline Phosphatase Latest Ref Range: 38 - 126 U/L 81  Albumin Latest Ref Range: 3.5 - 5.0 g/dL 2.7 (L)  AST Latest Ref Range: 15 - 41 U/L 24  ALT Latest Ref Range: 0 - 44 U/L 14  Total Protein Latest Ref Range: 6.5 - 8.1 g/dL 5.4 (L)  Total Bilirubin Latest Ref Range: 0.3 - 1.2 mg/dL 0.7  GFR, Estimated Latest Ref Range:  >60 mL/min >60     Diagnostics  DG Esophagus  IMPRESSION: Limited examination, as described. Problem-oriented water-soluble esophagram demonstrating no evidence of esophageal perforation/leak. Focus of apparent mild to, at most, moderate narrowing within the distal esophagus, which could reflect a focal stricture. Endoscopy may be considered for further evaluation, if clinically appropriate. The esophagus is otherwise normal in caliber. Mild intermittent esophageal dysmotility with tertiary contractions. Small sliding hiatal hernia.   IV/PRN Meds: cefTRIAXone (ROCEPHIN) 2 g in sodium chloride 0.9 % 100 mL IVPB,  dextrose 5 % and 0.9 % NaCl with KCl 20 mEq/L infusion, potassium chloride 10 mEq in 100 mL IVPB, No PRNs given   Problem List: -Acute blood loss anemia due to upper GI bleed-presents with hematemesis. --Esophageal stricture  -SIRS/hypotension/lactic acidosis-no clear source of infection yet.  Recent history of dental infection. ---Continue broad-spectrum antibiotics with vancomycin, cefepime and Flagyl -Bradycardia: Seems to have junctional rhythm on telemetry monitoring. -Acute metabolic encephalopathy in patient with advanced dementia-multifactorial.  -Metabolic acidosis-likely due to lactic acidosis and dehydration.  -AKI/azotemia -Coagulopathy: Resolved. -Debility/fall at nursing home-patient had a fall with head injury 3 days prior to presentation.  Had forehead laceration that has been repaired with staples   Discharge planning: Return to memory care when stable for discharge as appropriate.   Family Contact: updated at bedside   IDG: Updated  GOC: Clear. DNR. Treat the infection but no aggressive interventions such as blood transfusions or colonoscopy/endoscopy.    Should patient need ambulance transport at discharge please use [facility] as [facility] contracts this service with them for our active hospice patients. Per patients son he would like to take patient home by  private car, TOC is aware.    Please do not hesitate to call with questions.    Thank you,   Clementeen Hoof, BSN, RN   [facility] Liaison   [phone]

## 2021-09-04 NOTE — Consult Note (Signed)
Date of Admission:  08/31/2021          Reason for Consult:  Positive blood culture fro Proteus   Referring Provider: Jacki Cones, MD   Assessment:  Isolated positive blood culture for Proteus in 1 of 2 blood cultures Upper GI bleed with hematemesis in a patient with a history of esophageal ulcer Dehisced wound on forehead where he had a laceration sutured Decubitus ulcer Advanced dementia with patient being wheelchair-bound History of esophageal tear Hypertension Hypothyroidism  Plan:  Narrow to ceftriaxone Follow-up blood culture sensitivities and complete 7 days total antibiotics hopefully with oral therapy rather than continued IV antibiotics Continued supportive care DNR/DNI and palliative care   Active Problems:   Blood loss anemia   Acute encephalopathy   GI bleed   Hypoalbuminemia   Hypotension   Low bicarbonate   Sepsis (HCC)   Scheduled Meds:  Chlorhexidine Gluconate Cloth  6 each Topical Q0600   hydrocortisone sod succinate (SOLU-CORTEF) inj  50 mg Intravenous Daily   mouth rinse  15 mL Mouth Rinse BID   pantoprazole (PROTONIX) IV  40 mg Intravenous Q12H   Continuous Infusions:  sodium chloride Stopped (09/02/21 1824)   cefTRIAXone (ROCEPHIN)  IV Stopped (09/03/21 2358)   lactated ringers     potassium chloride 10 mEq (09/04/21 1302)   PRN Meds:.sodium chloride, acetaminophen **OR** acetaminophen, hydrogen peroxide, ondansetron **OR** ondansetron (ZOFRAN) IV  HPI: Anthony [facility] is a 86 y.o. male with severe dementia hyperlipidemia hypertension hypothyroidism renal disease esophageal tear who has been living on hospice since he had COVID-19 in 2020.  He had had a recent trip to the ER after he slipped off his wheelchair and struck his forehead and began bleeding copiously as well as sustaining injury to his left eye.  Had suturing performed of the area where he had lacerated his forehead and ultimately went back to the skilled nursing facility.   He then began having episodes of choking with apparently hematochezia and hematemesis.  While he was vomiting his forehead sutures dehisced.  This was too much for the hospice care to manage and he was sent to the emergency department.  The ER he was found to have a temperature of 101.1 blood cultures were taken along with urine cultures.  X-ray was negative CT of the head noted some progressive preseptal soft to switch to swelling where he had struck his head along with some bilateral moat lacunar infarcts.  He had a CT chest abdomen pelvis performed which showed some slight thickening of the bladder wall no significant intra-abdominal pathology.  GI does not recommend further work-up after barium swallow was performed.     Lung parenchyma was also similarly clear.   Started on antibiotics for sepsis with vancomycin and cefepime and metronidazole.  In the interim one of his 2 cultures from admission is turned positive for Proteus with the other 1 being negative urine cultures are negative as well.  His Proteus could be a contaminant though I do not typically like to treat gram-negative's as contaminants particularly when he was febrile and had hypotension though he had other reasons for hypotension including his hematemesis.  We will change some cefepime to ceftriaxone Follow-up sensitivity data and hopefully build to change him over to an oral antibiotic to complete a total of 7 days of therapy.  I spent 89mnutes with the patient including than 50% of the time in face to face counseling of the patients surrogate his son and daughter (over  phone) Ellyn Hack our work-up for his positive blood culture, his overall condition patient personally CT head CT chest abdomen pelvis chest x-ray blood culture urine culture data CBC metabolic panel along with reviewing along with review of medical records in preparation for the visit and during the visit and in coordination of his care with Dr. Zigmund Daniel.    Review of Systems: Review of Systems  Unable to perform ROS: Dementia   Past Medical History:  Diagnosis Date   Arthritis    "joints" (10/15/2017)   Benign prostatic hyperplasia    Complication of anesthesia    "last OR in 06/2017 affected him cognitively; hasn't got back to baseline yet" (10/15/2017)   Dementia (HCC)    Depression    GERD (gastroesophageal reflux disease)    High cholesterol    Hypertension    Hypothyroidism    Renal disease    Renal disorder    TIA (transient ischemic attack) early 2000s    Social History   Tobacco Use   Smoking status: Former    Years: 7.00    Types: Cigarettes    Quit date: 1963    Years since quitting: 59.7   Smokeless tobacco: Never  Vaping Use   Vaping Use: Never used  Substance Use Topics   Alcohol use: Yes    Comment: 10/15/2017 "a few drinks/year"   Drug use: No    Family History  Problem Relation Age of Onset   Hypertension Father    Allergies  Allergen Reactions   Lisinopril Swelling    Swelling of lips    Lisinopril Other (See Comments)    "Allergic," per MAR    OBJECTIVE: Blood pressure 132/65, pulse 87, temperature 97.9 F (36.6 C), temperature source Axillary, resp. rate 18, height 5' 6"  (1.676 m), weight 60.4 kg, SpO2 94 %.  Physical Exam Constitutional:      Appearance: He is well-developed. He is ill-appearing.  HENT:     Head: Raccoon eyes present.     Comments: Midline suture where he had laceration on his forehead Eyes:     Conjunctiva/sclera: Conjunctivae normal.  Cardiovascular:     Rate and Rhythm: Normal rate and regular rhythm.  Pulmonary:     Effort: Pulmonary effort is normal. No respiratory distress.     Breath sounds: No wheezing.  Abdominal:     General: There is no distension.     Palpations: Abdomen is soft.  Musculoskeletal:        General: No tenderness. Normal range of motion.     Cervical back: Normal range of motion and neck supple.  Skin:    General: Skin is warm  and dry.     Coloration: Skin is not pale.     Findings: No erythema or rash.  Neurological:     Mental Status: He is alert. He is disoriented.  Psychiatric:        Cognition and Memory: Memory is impaired. He exhibits impaired recent memory and impaired remote memory.   Decubitus ulcer 09/04/2021:     Lab Results Lab Results  Component Value Date   WBC 11.5 (H) 09/04/2021   HGB 10.7 (L) 09/04/2021   HCT 30.4 (L) 09/04/2021   MCV 88.9 09/04/2021   PLT 177 09/04/2021    Lab Results  Component Value Date   CREATININE 1.06 09/04/2021   CREATININE 1.09 09/04/2021   BUN 17 09/04/2021   NA 136 09/04/2021   K 3.3 (L) 09/04/2021   CL 104 09/04/2021   CO2  20 (L) 09/04/2021    Lab Results  Component Value Date   ALT 14 09/04/2021   AST 24 09/04/2021   ALKPHOS 81 09/04/2021   BILITOT 0.7 09/04/2021     Microbiology: Recent Results (from the past 240 hour(s))  Culture, blood (single)     Status: None (Preliminary result)   Collection Time: 08/31/21  9:31 PM   Specimen: BLOOD  Result Value Ref Range Status   Specimen Description   Final    BLOOD BLOOD RIGHT FOREARM Performed at [facility] [address], [place], [place] [address]    Special Requests   Final    BOTTLES DRAWN AEROBIC AND ANAEROBIC Blood Culture adequate volume Performed at [facility] [address] [place], [place] [address]    Culture   Final    NO GROWTH 3 DAYS Performed at [facility], [place] [address], [place], [place] [address]    Report Status PENDING  Incomplete  Resp Panel by RT-PCR (Flu A&B, Covid) Nasopharyngeal Swab     Status: None   Collection Time: 08/31/21  9:53 PM   Specimen: Nasopharyngeal Swab; Nasopharyngeal(NP) swabs in vial transport medium  Result Value Ref Range Status   SARS Coronavirus 2 by RT PCR NEGATIVE NEGATIVE Final    Comment: (NOTE) SARS-CoV-2 target nucleic acids are NOT DETECTED.  The SARS-CoV-2 RNA is generally  detectable in upper respiratory specimens during the acute phase of infection. The lowest concentration of SARS-CoV-2 viral copies this assay can detect is 138 copies/mL. A negative result does not preclude SARS-Cov-2 infection and should not be used as the sole basis for treatment or other patient management decisions. A negative result may occur with  improper specimen collection/handling, submission of specimen other than nasopharyngeal swab, presence of viral mutation(s) within the areas targeted by this assay, and inadequate number of viral copies(<138 copies/mL). A negative result must be combined with clinical observations, patient history, and epidemiological information. The expected result is Negative.  Fact Sheet for Patients:  EntrepreneurPulse.com.au  Fact Sheet for Healthcare Providers:  IncredibleEmployment.be  This test is no t yet approved or cleared by the [place] FDA and  has been authorized for detection and/or diagnosis of SARS-CoV-2 by FDA under an Emergency Use Authorization (EUA). This EUA will remain  in effect (meaning this test can be used) for the duration of the COVID-19 declaration under Section 564(b)(1) of the Act, 21 U.S.C.section 360bbb-3(b)(1), unless the authorization is terminated  or revoked sooner.       Influenza A by PCR NEGATIVE NEGATIVE Final   Influenza B by PCR NEGATIVE NEGATIVE Final    Comment: (NOTE) The Xpert Xpress SARS-CoV-2/FLU/RSV plus assay is intended as an aid in the diagnosis of influenza from Nasopharyngeal swab specimens and should not be used as a sole basis for treatment. Nasal washings and aspirates are unacceptable for Xpert Xpress SARS-CoV-2/FLU/RSV testing.  Fact Sheet for Patients: EntrepreneurPulse.com.au  Fact Sheet for Healthcare Providers: IncredibleEmployment.be  This test is not yet approved or cleared by the [place] FDA  and has been authorized for detection and/or diagnosis of SARS-CoV-2 by FDA under an Emergency Use Authorization (EUA). This EUA will remain in effect (meaning this test can be used) for the duration of the COVID-19 declaration under Section 564(b)(1) of the Act, 21 U.S.C. section 360bbb-3(b)(1), unless the authorization is terminated or revoked.  Performed at [facility], [place] [address], [place], [place] [address]   Blood Culture (routine  x 2)     Status: Abnormal (Preliminary result)   Collection Time: 08/31/21 11:17 PM   Specimen: BLOOD  Result Value Ref Range Status   Specimen Description   Final    BLOOD RIGHT ANTECUBITAL Performed at [facility] [address] [place], [place] [address]    Special Requests   Final    BOTTLES DRAWN AEROBIC AND ANAEROBIC Blood Culture adequate volume Performed at [facility] [address], [place], [place] [address]    Culture  Setup Time   Final    GRAM NEGATIVE RODS AEROBIC BOTTLE ONLY Organism ID to follow CRITICAL RESULT CALLED TO, READ BACK BY AND VERIFIED WITH: D WOFFORD PHARMD 1708 09/03/21 A BROWNING    Culture (A)  Final    PROTEUS MIRABILIS SUSCEPTIBILITIES TO FOLLOW Performed at [facility], [place] [address], [place],  [address]    Report Status PENDING  Incomplete  Blood Culture ID Panel (Reflexed)     Status: Abnormal   Collection Time: 08/31/21 11:17 PM  Result Value Ref Range Status   Enterococcus faecalis NOT DETECTED NOT DETECTED Final   Enterococcus Faecium NOT DETECTED NOT DETECTED Final   Listeria monocytogenes NOT DETECTED NOT DETECTED Final   Staphylococcus species NOT DETECTED NOT DETECTED Final   Staphylococcus aureus (BCID) NOT DETECTED NOT DETECTED Final   Staphylococcus epidermidis NOT DETECTED NOT DETECTED Final   Staphylococcus lugdunensis NOT DETECTED NOT DETECTED Final   Streptococcus species NOT DETECTED NOT DETECTED Final    Streptococcus agalactiae NOT DETECTED NOT DETECTED Final   Streptococcus pneumoniae NOT DETECTED NOT DETECTED Final   Streptococcus pyogenes NOT DETECTED NOT DETECTED Final   A.calcoaceticus-baumannii NOT DETECTED NOT DETECTED Final   Bacteroides fragilis NOT DETECTED NOT DETECTED Final   Enterobacterales DETECTED (A) NOT DETECTED Final    Comment: Enterobacterales represent a large order of gram negative bacteria, not a single organism. CRITICAL RESULT CALLED TO, READ BACK BY AND VERIFIED WITH: D WOFFORD PHARMD 1708 09/03/21 A BROWNING    Enterobacter cloacae complex NOT DETECTED NOT DETECTED Final   Escherichia coli NOT DETECTED NOT DETECTED Final   Klebsiella aerogenes NOT DETECTED NOT DETECTED Final   Klebsiella oxytoca NOT DETECTED NOT DETECTED Final   Klebsiella pneumoniae NOT DETECTED NOT DETECTED Final   Proteus species DETECTED (A) NOT DETECTED Final    Comment: CRITICAL RESULT CALLED TO, READ BACK BY AND VERIFIED WITH: D WOFFORD PHARMD 1708 09/03/21 A BROWNING    Salmonella species NOT DETECTED NOT DETECTED Final   Serratia marcescens NOT DETECTED NOT DETECTED Final   Haemophilus influenzae NOT DETECTED NOT DETECTED Final   Neisseria meningitidis NOT DETECTED NOT DETECTED Final   Pseudomonas aeruginosa NOT DETECTED NOT DETECTED Final   Stenotrophomonas maltophilia NOT DETECTED NOT DETECTED Final   Candida albicans NOT DETECTED NOT DETECTED Final   Candida auris NOT DETECTED NOT DETECTED Final   Candida glabrata NOT DETECTED NOT DETECTED Final   Candida krusei NOT DETECTED NOT DETECTED Final   Candida parapsilosis NOT DETECTED NOT DETECTED Final   Candida tropicalis NOT DETECTED NOT DETECTED Final   Cryptococcus neoformans/gattii NOT DETECTED NOT DETECTED Final   CTX-M ESBL NOT DETECTED NOT DETECTED Final   Carbapenem resistance IMP NOT DETECTED NOT DETECTED Final   Carbapenem resistance KPC NOT DETECTED NOT DETECTED Final   Carbapenem resistance NDM NOT DETECTED NOT DETECTED  Final   Carbapenem resist OXA 48 LIKE NOT DETECTED NOT DETECTED Final   Carbapenem resistance VIM NOT  DETECTED NOT DETECTED Final    Comment: Performed at [facility], [place] [address], [place], [place] [address]  MRSA Next Gen by PCR, Nasal     Status: None   Collection Time: 09/01/21  5:45 AM   Specimen: Nasal Mucosa; Nasal Swab  Result Value Ref Range Status   MRSA by PCR Next Gen NOT DETECTED NOT DETECTED Final    Comment: (NOTE) The GeneXpert MRSA Assay (FDA approved for NASAL specimens only), is one component of a comprehensive MRSA colonization surveillance program. It is not intended to diagnose MRSA infection nor to guide or monitor treatment for MRSA infections. Test performance is not FDA approved in patients less than 39 years old. Performed at [facility], [place] [address] [place], [place] [address]   Blood Culture (routine x 2)     Status: None (Preliminary result)   Collection Time: 09/01/21  5:47 AM   Specimen: BLOOD  Result Value Ref Range Status   Specimen Description   Final    BLOOD BLOOD LEFT FOREARM Performed at [facility] [address] [place], [place] [address]    Special Requests   Final    BOTTLES DRAWN AEROBIC AND ANAEROBIC Blood Culture adequate volume Performed at [facility] [address], [place], [place] [address]    Culture   Final    NO GROWTH 3 DAYS Performed at [facility], [place] [address] [place], [place] [address]    Report Status PENDING  Incomplete  Urine Culture     Status: None   Collection Time: 09/01/21  7:53 AM   Specimen: Urine, Catheterized  Result Value Ref Range Status   Specimen Description   Final    URINE, CATHETERIZED Performed at [facility] [address], [place], [place] [address]    Special Requests   Final    NONE Performed at [facility], [place] [address], [place], [place] [address]    Culture    Final    NO GROWTH Performed at [facility], [place] [address], [place], [place] [address]    Report Status 09/02/2021 FINAL  Final    Alcide Evener, [facility] [phone] pager  09/04/2021, 1:15 PM

## 2021-09-04 NOTE — Progress Notes (Signed)
New distention noted to patients abdomen, patient has had good output today, 3000 mL. @ [facility] noted. MD notified.

## 2021-09-04 NOTE — Progress Notes (Addendum)
Patient noted to be complaining of dysuria. No hematuria noted. No urinary frequency. No abdominal pain. MD notified.

## 2021-09-04 NOTE — Care Management Important Message (Signed)
Important Message  Patient Details IM Letter placed in Patient's room for Daughter [name]. Name: Anthony [facility] MRN: [id number] Date of Birth: 16-Feb-1935   Medicare Important Message Given:  Yes     Kerin Salen 09/04/2021, 10:37 AM

## 2021-09-04 NOTE — Progress Notes (Signed)
PROGRESS NOTE    Anthony Hayes  TKP:[id number] DOB: 07/07/1935 DOA: 08/31/2021 PCP: Etter Sjogren, DO   Brief Narrative: 86 year old M with history of dementia on hospice, HTN, hypothyroidism, BPH, GERD, esophageal tear, and osteoarthritis brought to ED by EMS with concern for hematemesis, hematochezia and mental status change, and admitted for ABLA from acute GI bleed, SIRS and hypotension.  He was febrile with leukocytosis, hypotension and lactic acidosis.  Hgb 7.3 (baseline 13 about 3 days prior).  CXR and CT head without significant finding other than frontal and left preseptal soft tissue swelling.  CT abdomen and pelvis raises concern for cystitis.  UA with moderate Hgb, LE and rare bacteria.  Cultures obtained.  Patient was started on IV fluid and broad-spectrum antibiotics, and admitted.  Per admitting provider, family not interested in blood transfusion, ACLS meds or invasive procedures such as endoscopy or colonoscopy as of now.    Blood and urine cultures negative.  Blood pressure improved but bradycardic to 30s and 40s.  He is not symptomatic from this.  Per family, baseline heart rate is in 31s.  Remains on broad-spectrum antibiotics until cultures are negative for 48 hours.  Family not interested in escalation of care.  Follow esophagram.  Assessment & Plan:   Active Problems:   Blood loss anemia   Acute encephalopathy   GI bleed   Hypoalbuminemia   Hypotension   Low bicarbonate   Sepsis (HCC)    #1 acute blood loss anemia due to upper GI bleed and presented with hematemesis has a history of esophageal ulcer and GERD in the past. GI consulted since family now wanting to pursue endoscopy.  Continue IV Protonix. Continue IV fluids. Plavix stopped. Esophagogram-concerning for stricture  #2 sepsis poa-1 out of 2 blood cultures positive for Proteus.  Sensitivities are pending. On broad-spectrum antibiotics Vanco cefepime and Flagyl. MRSA PCR negative, DC vancomycin.  upon  admission he was febrile with leukocytosis lactic acidosis and hypotension and unclear source.  Cultures negative.  #3 bradycardia avoid nodal blocking agents Patient asymptomatic. EKG with sinus bradycardia no acute findings.  #4 AKI likely from hypotension/infectious process possible, CT abdomen no obstruction.  #5 acute metabolic encephalopathy likely multifactorial.  CT head with frontal soft tissue swelling from recent fall and trauma.  #6 hypothyroidism on IV Synthroid  #7 recurrent falls at the nursing home patient had a fall with head injury with 3 days prior to admission to the hospital.  Forehead laceration has been repaired with staples and stitches on 08/29/2021.  #8 goals of care patient's CODE STATUS is DNR/DNI. Family is not interested in aggressive work-up blood transfusion or ACLS medications or invasive procedures, however they are okay with proceeding with endoscopy. Will order COVID test for possible discharge in a.m.   Pressure Injury 04/05/18 Stage I -  Intact skin with non-blanchable redness of a localized area usually over a bony prominence. (Active)  04/05/18 0800  Location: Buttocks  Location Orientation:   Staging: Stage I -  Intact skin with non-blanchable redness of a localized area usually over a bony prominence.  Wound Description (Comments):   Present on Admission:      Pressure Injury 12/20/19 Buttocks Left Stage 1 -  Intact skin with non-blanchable redness of a localized area usually over a bony prominence. (Active)  12/20/19 0236  Location: Buttocks  Location Orientation: Left  Staging: Stage 1 -  Intact skin with non-blanchable redness of a localized area usually over a bony prominence.  Wound Description (  Comments):   Present on Admission:     Estimated body mass index is 21.49 kg/m as calculated from the following:   Height as of this encounter: 5' 6"  (1.676 m).   Weight as of this encounter: 60.4 kg.  DVT prophylaxis: SCD  code Status: DO  NOT RESUSCITATE Family Communication: Discussed with daughter [name] over the phone  disposition Plan:  Status is: Inpatient  Remains inpatient appropriate because:Hemodynamically unstable  Dispo: The patient is from: Home              Anticipated d/c is to: Home              Patient currently is not medically stable to d/c.   Difficult to place patient No   Consultants:  GI  Procedures:none Antimicrobials:   Subjective:  Patient resting in bed staff reports no overnight events he is extremely hard of hearing Objective: Vitals:   09/03/21 1730 09/03/21 1959 09/04/21 0112 09/04/21 0525  BP: 134/83 (!) 152/80 126/65 132/65  Pulse: 86 69 64 87  Resp:   18 18  Temp: 98 F (36.7 C) 98.4 F (36.9 C) 97.8 F (36.6 C) 97.9 F (36.6 C)  TempSrc: Axillary Axillary Axillary Axillary  SpO2: 94% 100% 95% 94%  Weight:      Height:        Intake/Output Summary (Last 24 hours) at 09/04/2021 1420 Last data filed at 09/04/2021 0406 Gross per 24 hour  Intake 1396.49 ml  Output 2950 ml  Net -1553.51 ml    Filed Weights   08/31/21 2009 09/01/21 0600  Weight: 66 kg 60.4 kg    Examination: Staples on the forehead clean dry and intact  General exam: Appears calm and comfortable  Respiratory system: Clear to auscultation. Respiratory effort normal. Cardiovascular system: S1 & S2 heard, RRR. No JVD, murmurs, rubs, gallops or clicks. No pedal edema. Gastrointestinal system: Abdomen is nondistended, soft and nontender. No organomegaly or masses felt. Normal bowel sounds heard. Central nervous system: Alert and oriented. No focal neurological deficits. Extremities: Symmetric 5 x 5 power. Skin: No rashes, lesions or ulcers Psychiatry: Judgement and insight appear normal. Mood & affect appropriate.     Data Reviewed: I have personally reviewed following labs and imaging studies  CBC: Recent Labs  Lab 08/29/21 1848 08/31/21 2300 09/01/21 0706 09/01/21 1300 09/02/21 0042  09/02/21 0555 09/02/21 1311 09/03/21 0239 09/04/21 0505  WBC 9.3 15.7* 28.6*  --  16.9*  --   --  18.0* 11.5*  NEUTROABS 6.6 15.0*  --   --   --   --   --   --   --   HGB 13.9 7.3* 9.6*   < > 8.9* 8.5* 9.4* 10.1* 10.7*  HCT 41.0 22.6* 27.5*   < > 26.1* 24.6* 27.8* 29.4* 30.4*  MCV 90.9 96.6 89.6  --  90.6  --   --  90.7 88.9  PLT 298 137* 163  --  121*  --   --  120* 177   < > = values in this interval not displayed.    Basic Metabolic Panel: Recent Labs  Lab 08/31/21 2300 09/01/21 0556 09/02/21 0042 09/03/21 0239 09/04/21 0505  NA 134* 138 137 135 136  K 3.6 3.7 3.8 4.1 3.3*  CL 106 109 111 109 104  CO2 16* 20* 19* 20* 20*  GLUCOSE 67* 85 114* 127* 106*  BUN 21 29* 29* 30* 17  CREATININE 0.80 1.33* 1.29* 1.05  1.11 1.09  1.06  CALCIUM 7.4* 8.6* 8.5* 8.4* 8.6*  MG  --   --  1.5* 2.1 1.6*  PHOS  --   --  3.3 2.6  --     GFR: Estimated Creatinine Clearance: 44.3 mL/min (by C-G formula based on SCr of 1.06 mg/dL). Liver Function Tests: Recent Labs  Lab 08/29/21 1848 08/31/21 2300 09/01/21 0556 09/02/21 0042 09/03/21 0239 09/04/21 0505  AST 27 30 41  --   --  24  ALT 16 15 20   --   --  14  ALKPHOS 120 70 90  --   --  81  BILITOT 0.9 0.5 0.9  --   --  0.7  PROT 7.6 3.6* 5.7*  --   --  5.4*  ALBUMIN 3.8 1.8* 2.8* 2.6* 2.6* 2.7*    No results for input(s): LIPASE, AMYLASE in the last 168 hours. No results for input(s): AMMONIA in the last 168 hours. Coagulation Profile: Recent Labs  Lab 08/29/21 1848 08/31/21 2300 09/01/21 0556  INR 1.1 1.6* 1.2    Cardiac Enzymes: Recent Labs  Lab 08/29/21 1848 09/01/21 0415  CKTOTAL 69 317    BNP (last 3 results) No results for input(s): PROBNP in the last 8760 hours. HbA1C: No results for input(s): HGBA1C in the last 72 hours. CBG: Recent Labs  Lab 09/02/21 0759 09/03/21 0851 09/03/21 1813 09/03/21 2001 09/04/21 0810  GLUCAP 107* 115* 109* 177* 116*    Lipid Profile: No results for input(s): CHOL,  HDL, LDLCALC, TRIG, CHOLHDL, LDLDIRECT in the last 72 hours. Thyroid Function Tests: No results for input(s): TSH, T4TOTAL, FREET4, T3FREE, THYROIDAB in the last 72 hours. Anemia Panel: No results for input(s): VITAMINB12, FOLATE, FERRITIN, TIBC, IRON, RETICCTPCT in the last 72 hours. Sepsis Labs: Recent Labs  Lab 09/01/21 0400 09/01/21 0415 09/01/21 0547 09/01/21 0556 09/02/21 0042 09/03/21 0239  PROCALCITON 72.40  --   --  79.54 55.45 29.36  LATICACIDVEN  --  3.2* 2.5*  --   --   --      Recent Results (from the past 240 hour(s))  Culture, blood (single)     Status: None (Preliminary result)   Collection Time: 08/31/21  9:31 PM   Specimen: BLOOD  Result Value Ref Range Status   Specimen Description   Final    BLOOD BLOOD RIGHT FOREARM Performed at [facility], [place] [address] [place], [place] [address]    Special Requests   Final    BOTTLES DRAWN AEROBIC AND ANAEROBIC Blood Culture adequate volume Performed at [facility] [address] [place], [place] [address]    Culture   Final    NO GROWTH 3 DAYS Performed at [facility], [place] [address] [place], [place] [address]    Report Status PENDING  Incomplete  Resp Panel by RT-PCR (Flu A&B, Covid) Nasopharyngeal Swab     Status: None   Collection Time: 08/31/21  9:53 PM   Specimen: Nasopharyngeal Swab; Nasopharyngeal(NP) swabs in vial transport medium  Result Value Ref Range Status   SARS Coronavirus 2 by RT PCR NEGATIVE NEGATIVE Final    Comment: (NOTE) SARS-CoV-2 target nucleic acids are NOT DETECTED.  The SARS-CoV-2 RNA is generally detectable in upper respiratory specimens during the acute phase of infection. The lowest concentration of SARS-CoV-2 viral copies this assay can detect is 138 copies/mL. A negative result does not preclude SARS-Cov-2 infection and should not be used as the sole basis for treatment or other patient management decisions. A  negative result  may occur with  improper specimen collection/handling, submission of specimen other than nasopharyngeal swab, presence of viral mutation(s) within the areas targeted by this assay, and inadequate number of viral copies(<138 copies/mL). A negative result must be combined with clinical observations, patient history, and epidemiological information. The expected result is Negative.  Fact Sheet for Patients:  EntrepreneurPulse.com.au  Fact Sheet for Healthcare Providers:  IncredibleEmployment.be  This test is no t yet approved or cleared by the [place] FDA and  has been authorized for detection and/or diagnosis of SARS-CoV-2 by FDA under an Emergency Use Authorization (EUA). This EUA will remain  in effect (meaning this test can be used) for the duration of the COVID-19 declaration under Section 564(b)(1) of the Act, 21 U.S.C.section 360bbb-3(b)(1), unless the authorization is terminated  or revoked sooner.       Influenza A by PCR NEGATIVE NEGATIVE Final   Influenza B by PCR NEGATIVE NEGATIVE Final    Comment: (NOTE) The Xpert Xpress SARS-CoV-2/FLU/RSV plus assay is intended as an aid in the diagnosis of influenza from Nasopharyngeal swab specimens and should not be used as a sole basis for treatment. Nasal washings and aspirates are unacceptable for Xpert Xpress SARS-CoV-2/FLU/RSV testing.  Fact Sheet for Patients: EntrepreneurPulse.com.au  Fact Sheet for Healthcare Providers: IncredibleEmployment.be  This test is not yet approved or cleared by the [place] FDA and has been authorized for detection and/or diagnosis of SARS-CoV-2 by FDA under an Emergency Use Authorization (EUA). This EUA will remain in effect (meaning this test can be used) for the duration of the COVID-19 declaration under Section 564(b)(1) of the Act, 21 U.S.C. section 360bbb-3(b)(1), unless the authorization is terminated  or revoked.  Performed at [facility], [place] [address], [place], [place] [address]   Blood Culture (routine x 2)     Status: Abnormal (Preliminary result)   Collection Time: 08/31/21 11:17 PM   Specimen: BLOOD  Result Value Ref Range Status   Specimen Description   Final    BLOOD RIGHT ANTECUBITAL Performed at [facility] [address] [place], [place] [address]    Special Requests   Final    BOTTLES DRAWN AEROBIC AND ANAEROBIC Blood Culture adequate volume Performed at [facility] [address], [place], [place] [address]    Culture  Setup Time   Final    GRAM NEGATIVE RODS AEROBIC BOTTLE ONLY Organism ID to follow CRITICAL RESULT CALLED TO, READ BACK BY AND VERIFIED WITH: D WOFFORD PHARMD 1708 09/03/21 A BROWNING    Culture (A)  Final    PROTEUS MIRABILIS SUSCEPTIBILITIES TO FOLLOW Performed at [facility], [place] [address], [place], [place] [address]    Report Status PENDING  Incomplete  Blood Culture ID Panel (Reflexed)     Status: Abnormal   Collection Time: 08/31/21 11:17 PM  Result Value Ref Range Status   Enterococcus faecalis NOT DETECTED NOT DETECTED Final   Enterococcus Faecium NOT DETECTED NOT DETECTED Final   Listeria monocytogenes NOT DETECTED NOT DETECTED Final   Staphylococcus species NOT DETECTED NOT DETECTED Final   Staphylococcus aureus (BCID) NOT DETECTED NOT DETECTED Final   Staphylococcus epidermidis NOT DETECTED NOT DETECTED Final   Staphylococcus lugdunensis NOT DETECTED NOT DETECTED Final   Streptococcus species NOT DETECTED NOT DETECTED Final   Streptococcus agalactiae NOT DETECTED NOT DETECTED Final   Streptococcus pneumoniae NOT DETECTED NOT DETECTED Final   Streptococcus pyogenes NOT DETECTED NOT DETECTED Final  A.calcoaceticus-baumannii NOT DETECTED NOT DETECTED Final   Bacteroides fragilis NOT DETECTED NOT DETECTED Final   Enterobacterales DETECTED (A) NOT DETECTED Final     Comment: Enterobacterales represent a large order of gram negative bacteria, not a single organism. CRITICAL RESULT CALLED TO, READ BACK BY AND VERIFIED WITH: D WOFFORD PHARMD 1708 09/03/21 A BROWNING    Enterobacter cloacae complex NOT DETECTED NOT DETECTED Final   Escherichia coli NOT DETECTED NOT DETECTED Final   Klebsiella aerogenes NOT DETECTED NOT DETECTED Final   Klebsiella oxytoca NOT DETECTED NOT DETECTED Final   Klebsiella pneumoniae NOT DETECTED NOT DETECTED Final   Proteus species DETECTED (A) NOT DETECTED Final    Comment: CRITICAL RESULT CALLED TO, READ BACK BY AND VERIFIED WITH: D WOFFORD PHARMD 1708 09/03/21 A BROWNING    Salmonella species NOT DETECTED NOT DETECTED Final   Serratia marcescens NOT DETECTED NOT DETECTED Final   Haemophilus influenzae NOT DETECTED NOT DETECTED Final   Neisseria meningitidis NOT DETECTED NOT DETECTED Final   Pseudomonas aeruginosa NOT DETECTED NOT DETECTED Final   Stenotrophomonas maltophilia NOT DETECTED NOT DETECTED Final   Candida albicans NOT DETECTED NOT DETECTED Final   Candida auris NOT DETECTED NOT DETECTED Final   Candida glabrata NOT DETECTED NOT DETECTED Final   Candida krusei NOT DETECTED NOT DETECTED Final   Candida parapsilosis NOT DETECTED NOT DETECTED Final   Candida tropicalis NOT DETECTED NOT DETECTED Final   Cryptococcus neoformans/gattii NOT DETECTED NOT DETECTED Final   CTX-M ESBL NOT DETECTED NOT DETECTED Final   Carbapenem resistance IMP NOT DETECTED NOT DETECTED Final   Carbapenem resistance KPC NOT DETECTED NOT DETECTED Final   Carbapenem resistance NDM NOT DETECTED NOT DETECTED Final   Carbapenem resist OXA 48 LIKE NOT DETECTED NOT DETECTED Final   Carbapenem resistance VIM NOT DETECTED NOT DETECTED Final    Comment: Performed at [facility], [address] [place], [place] [address]  MRSA Next Gen by PCR, Nasal     Status: None   Collection Time: 09/01/21  5:45 AM   Specimen: Nasal Mucosa; Nasal Swab   Result Value Ref Range Status   MRSA by PCR Next Gen NOT DETECTED NOT DETECTED Final    Comment: (NOTE) The GeneXpert MRSA Assay (FDA approved for NASAL specimens only), is one component of a comprehensive MRSA colonization surveillance program. It is not intended to diagnose MRSA infection nor to guide or monitor treatment for MRSA infections. Test performance is not FDA approved in patients less than 82 years old. Performed at [facility], [place] [address], [place], [place] [address]   Blood Culture (routine x 2)     Status: None (Preliminary result)   Collection Time: 09/01/21  5:47 AM   Specimen: BLOOD  Result Value Ref Range Status   Specimen Description   Final    BLOOD BLOOD LEFT FOREARM Performed at [facility] [address] [place], [place] [address]    Special Requests   Final    BOTTLES DRAWN AEROBIC AND ANAEROBIC Blood Culture adequate volume Performed at [facility] [address] [place], [place] [address]    Culture   Final    NO GROWTH 3 DAYS Performed at [facility], [place] [address], [place], [place] [address]    Report Status PENDING  Incomplete  Urine Culture     Status: None   Collection Time: 09/01/21  7:53 AM   Specimen: Urine, Catheterized  Result Value Ref  Range Status   Specimen Description   Final    URINE, CATHETERIZED Performed at [facility] [address], [place], [place] [address]    Special Requests   Final    NONE Performed at [facility], [place] [address], [place], [place] [address]    Culture   Final    NO GROWTH Performed at [facility], [place] [address] [place], [place] [address]    Report Status 09/02/2021 FINAL  Final          Radiology Studies: DG ESOPHAGUS W SINGLE CM (SOL OR THIN BA)  Result Date: 09/02/2021 CLINICAL DATA:  Dysphagia.  Hematemesis. EXAM: ESOPHOGRAM/BARIUM SWALLOW TECHNIQUE: Single contrast  examination was performed using thin barium or water soluble. FLUOROSCOPY TIME:  Fluoroscopy Time:  1 minute, 30 seconds Radiation Exposure Index (if provided by the fluoroscopic device): 35.8 mGy Number of Acquired Spot Images: 4 COMPARISON:  CT chest/abdomen/pelvis 09/01/2021. FINDINGS: Evaluation limited by the patient's inability to stand and limited ability to reposition. There is a focus of apparent narrowing within the distal esophagus which appears mild to, at most, moderate in severity. This was not appreciated during the examination, but was identified on post-procedure review of the esophagram images. The esophagus is otherwise normal in caliber. Mild intermittent esophageal dysmotility with tertiary contractions. Small sliding hiatal hernia. No gastroesophageal reflux was observed. No extraluminal contrast is demonstrated to suggest esophageal perforation/leak. IMPRESSION: Limited examination, as described. Problem-oriented water-soluble esophagram demonstrating no evidence of esophageal perforation/leak. Focus of apparent mild to, at most, moderate narrowing within the distal esophagus, which could reflect a focal stricture. Endoscopy may be considered for further evaluation, if clinically appropriate. The esophagus is otherwise normal in caliber. Mild intermittent esophageal dysmotility with tertiary contractions. Small sliding hiatal hernia. Electronically Signed   By: Kellie Simmering D.O.   On: 09/02/2021 15:40        Scheduled Meds:  Chlorhexidine Gluconate Cloth  6 each Topical Q0600   hydrocortisone sod succinate (SOLU-CORTEF) inj  50 mg Intravenous Daily   mouth rinse  15 mL Mouth Rinse BID   pantoprazole (PROTONIX) IV  40 mg Intravenous Q12H   Continuous Infusions:  sodium chloride Stopped (09/02/21 1824)   sodium chloride     cefTRIAXone (ROCEPHIN)  IV Stopped (09/03/21 2358)   lactated ringers     potassium chloride 10 mEq (09/04/21 1418)     LOS: 3 days    Time spent: 42  min   Georgette Shell, MD  09/04/2021, 2:20 PM

## 2021-09-05 ENCOUNTER — Inpatient Hospital Stay (HOSPITAL_COMMUNITY)

## 2021-09-05 DIAGNOSIS — G9340 Encephalopathy, unspecified: Secondary | ICD-10-CM | POA: Diagnosis not present

## 2021-09-05 DIAGNOSIS — A498 Other bacterial infections of unspecified site: Secondary | ICD-10-CM

## 2021-09-05 DIAGNOSIS — A419 Sepsis, unspecified organism: Secondary | ICD-10-CM | POA: Diagnosis not present

## 2021-09-05 DIAGNOSIS — R1312 Dysphagia, oropharyngeal phase: Secondary | ICD-10-CM | POA: Diagnosis not present

## 2021-09-05 DIAGNOSIS — S0181XA Laceration without foreign body of other part of head, initial encounter: Secondary | ICD-10-CM

## 2021-09-05 DIAGNOSIS — R1310 Dysphagia, unspecified: Secondary | ICD-10-CM

## 2021-09-05 DIAGNOSIS — S0181XD Laceration without foreign body of other part of head, subsequent encounter: Secondary | ICD-10-CM

## 2021-09-05 DIAGNOSIS — K922 Gastrointestinal hemorrhage, unspecified: Secondary | ICD-10-CM | POA: Diagnosis not present

## 2021-09-05 DIAGNOSIS — R7881 Bacteremia: Secondary | ICD-10-CM

## 2021-09-05 LAB — GLUCOSE, CAPILLARY
Glucose-Capillary: 82 mg/dL (ref 70–99)
Glucose-Capillary: 79 mg/dL (ref 70–99)

## 2021-09-05 LAB — CULTURE, BLOOD (ROUTINE X 2): Special Requests: ADEQUATE

## 2021-09-05 MED ORDER — AMOXICILLIN 500 MG PO CAPS: 500.0000 mg | ORAL_CAPSULE | Freq: Three times a day (TID) | ORAL | Status: DC

## 2021-09-05 MED ORDER — PANTOPRAZOLE SODIUM 40 MG PO TBEC
40.0000 mg | DELAYED_RELEASE_TABLET | Freq: Every day | ORAL | Status: DC
  Administered 2021-09-05: 40 mg via ORAL
  Filled 2021-09-05: qty 1

## 2021-09-05 MED ORDER — POLYETHYLENE GLYCOL 3350 17 G PO PACK
17.0000 g | PACK | Freq: Every day | ORAL | Status: DC
  Filled 2021-09-05: qty 1

## 2021-09-05 MED ORDER — ACETAMINOPHEN 325 MG PO TABS: 650.0000 mg | ORAL_TABLET | Freq: Four times a day (QID) | ORAL | Status: AC | PRN

## 2021-09-05 MED ORDER — ONDANSETRON HCL 4 MG PO TABS: 4.0000 mg | ORAL_TABLET | Freq: Four times a day (QID) | ORAL | 0 refills | Status: AC | PRN

## 2021-09-05 MED ORDER — POLYETHYLENE GLYCOL 3350 17 G PO PACK: 17.0000 g | PACK | Freq: Every day | ORAL | 0 refills | Status: AC

## 2021-09-05 MED ORDER — BISACODYL 5 MG PO TBEC
10.0000 mg | DELAYED_RELEASE_TABLET | Freq: Every day | ORAL | Status: DC
  Filled 2021-09-05: qty 2

## 2021-09-05 MED ORDER — AMOXICILLIN 500 MG PO CAPS: 500.0000 mg | ORAL_CAPSULE | Freq: Three times a day (TID) | ORAL | 0 refills | Status: AC

## 2021-09-05 NOTE — Progress Notes (Signed)
Subjective: Patient is sleeping and not easily arousable   Antibiotics:  Anti-infectives (From admission, onward)    Start     Dose/Rate Route Frequency Ordered Stop   09/05/21 2200  amoxicillin (AMOXIL) capsule 500 mg        500 mg Oral Every 8 hours 09/05/21 1231 09/08/21 2159   09/05/21 0000  amoxicillin (AMOXIL) 500 MG capsule        500 mg Oral 3 times daily 09/05/21 1240     09/03/21 2200  cefTRIAXone (ROCEPHIN) 2 g in sodium chloride 0.9 % 100 mL IVPB  Status:  Discontinued        2 g 200 mL/hr over 30 Minutes Intravenous Every 24 hours 09/03/21 1750 09/05/21 1231   09/02/21 1200  vancomycin (VANCOREADY) IVPB 750 mg/150 mL  Status:  Discontinued        750 mg 150 mL/hr over 60 Minutes Intravenous Every 24 hours 09/02/21 1035 09/03/21 1418   09/01/21 2200  vancomycin (VANCOREADY) IVPB 750 mg/150 mL  Status:  Discontinued        750 mg 150 mL/hr over 60 Minutes Intravenous Every 24 hours 09/01/21 0942 09/01/21 1202   09/01/21 1108  vancomycin variable dose per unstable renal function (pharmacist dosing)  Status:  Discontinued         Does not apply See admin instructions 09/01/21 1108 09/02/21 1346   09/01/21 1030  ceFEPIme (MAXIPIME) 2 g in sodium chloride 0.9 % 100 mL IVPB  Status:  Discontinued        2 g 200 mL/hr over 30 Minutes Intravenous Every 12 hours 09/01/21 0942 09/03/21 1750   09/01/21 1000  metroNIDAZOLE (FLAGYL) IVPB 500 mg  Status:  Discontinued        500 mg 100 mL/hr over 60 Minutes Intravenous Every 12 hours 09/01/21 0546 09/03/21 1750   09/01/21 0900  ceFEPIme (MAXIPIME) 2 g in sodium chloride 0.9 % 100 mL IVPB  Status:  Discontinued        2 g 200 mL/hr over 30 Minutes Intravenous Every 8 hours 09/01/21 0424 09/01/21 0938   08/31/21 2030  vancomycin (VANCOREADY) IVPB 1500 mg/300 mL        1,500 mg 150 mL/hr over 120 Minutes Intravenous  Once 08/31/21 2017 09/01/21 0238   08/31/21 2015  ceFEPIme (MAXIPIME) 2 g in sodium chloride 0.9 % 100 mL  IVPB        2 g 200 mL/hr over 30 Minutes Intravenous  Once 08/31/21 2012 09/01/21 0121   08/31/21 2015  metroNIDAZOLE (FLAGYL) IVPB 500 mg        500 mg 100 mL/hr over 60 Minutes Intravenous  Once 08/31/21 2012 08/31/21 2253   08/31/21 2015  vancomycin (VANCOCIN) IVPB 1000 mg/200 mL premix  Status:  Discontinued        1,000 mg 200 mL/hr over 60 Minutes Intravenous  Once 08/31/21 2012 08/31/21 2017       Medications: Scheduled Meds:  amoxicillin  500 mg Oral Q8H   bisacodyl  10 mg Oral Daily   Chlorhexidine Gluconate Cloth  6 each Topical Q0600   mouth rinse  15 mL Mouth Rinse BID   pantoprazole  40 mg Oral Daily   polyethylene glycol  17 g Oral Daily   Continuous Infusions:  sodium chloride Stopped (09/02/21 1824)   lactated ringers     PRN Meds:.sodium chloride, acetaminophen **OR** acetaminophen, hydrogen peroxide, ondansetron **OR** ondansetron (ZOFRAN) IV    Objective: Weight  change:   Intake/Output Summary (Last 24 hours) at 09/05/2021 1412 Last data filed at 09/05/2021 0500 Gross per 24 hour  Intake 478.57 ml  Output 1425 ml  Net -946.43 ml   Blood pressure (!) 129/56, pulse (!) 42, temperature 97.8 F (36.6 C), resp. rate 19, height 5' 6"  (1.676 m), weight 60.4 kg, SpO2 95 %. Temp:  [97.8 F (36.6 C)-98.4 F (36.9 C)] 97.8 F (36.6 C) (09/09 0511) Pulse Rate:  [42-44] 42 (09/09 0511) Resp:  [19-20] 19 (09/09 0511) BP: (129-132)/(56-72) 129/56 (09/09 0511) SpO2:  [95 %-97 %] 95 % (09/09 0511)  Physical Exam: Physical Exam Constitutional:      Appearance: He is well-developed. He is ill-appearing.  HENT:     Head: Normocephalic and atraumatic.  Eyes:     Conjunctiva/sclera: Conjunctivae normal.  Cardiovascular:     Rate and Rhythm: Normal rate and regular rhythm.  Pulmonary:     Effort: Pulmonary effort is normal. No respiratory distress.     Breath sounds: Normal breath sounds. No stridor. No wheezing.  Abdominal:     General: There is no  distension.     Palpations: Abdomen is soft.  Musculoskeletal:        General: Normal range of motion.     Cervical back: Normal range of motion and neck supple.  Skin:    General: Skin is warm and dry.     Findings: No erythema or rash.  Neurological:     Mental Status: He is disoriented.    Scar stable ecchymosis over left orbit  CBC:    BMET Recent Labs    09/03/21 0239 09/04/21 0505  NA 135 136  K 4.1 3.3*  CL 109 104  CO2 20* 20*  GLUCOSE 127* 106*  BUN 30* 17  CREATININE 1.05  1.11 1.09  1.06  CALCIUM 8.4* 8.6*     Liver Panel  Recent Labs    09/03/21 0239 09/04/21 0505  PROT  --  5.4*  ALBUMIN 2.6* 2.7*  AST  --  24  ALT  --  14  ALKPHOS  --  81  BILITOT  --  0.7       Sedimentation Rate No results for input(s): ESRSEDRATE in the last 72 hours. C-Reactive Protein No results for input(s): CRP in the last 72 hours.  Micro Results: Recent Results (from the past 720 hour(s))  Culture, blood (single)     Status: None (Preliminary result)   Collection Time: 08/31/21  9:31 PM   Specimen: BLOOD  Result Value Ref Range Status   Specimen Description   Final    BLOOD BLOOD RIGHT FOREARM Performed at [facility] [address], [place], [place] [address]    Special Requests   Final    BOTTLES DRAWN AEROBIC AND ANAEROBIC Blood Culture adequate volume Performed at [facility] [address], [place], [place] [address]    Culture   Final    NO GROWTH 4 DAYS Performed at [facility], [place] [address], [place], [place] [address]    Report Status PENDING  Incomplete  Resp Panel by RT-PCR (Flu A&B, Covid) Nasopharyngeal Swab     Status: None   Collection Time: 08/31/21  9:53 PM   Specimen: Nasopharyngeal Swab; Nasopharyngeal(NP) swabs in vial transport medium  Result Value Ref Range Status   SARS Coronavirus 2 by RT PCR NEGATIVE NEGATIVE Final    Comment: (NOTE) SARS-CoV-2 target nucleic acids are NOT  DETECTED.  The SARS-CoV-2 RNA is generally detectable in upper respiratory specimens during the acute phase of infection. The lowest concentration of SARS-CoV-2 viral copies this assay can detect is 138 copies/mL. A negative result does not preclude SARS-Cov-2 infection and should not be used as the sole basis for treatment or other patient management decisions. A negative result may occur with  improper specimen collection/handling, submission of specimen other than nasopharyngeal swab, presence of viral mutation(s) within the areas targeted by this assay, and inadequate number of viral copies(<138 copies/mL). A negative result must be combined with clinical observations, patient history, and epidemiological information. The expected result is Negative.  Fact Sheet for Patients:  EntrepreneurPulse.com.au  Fact Sheet for Healthcare Providers:  IncredibleEmployment.be  This test is no t yet approved or cleared by the [place] FDA and  has been authorized for detection and/or diagnosis of SARS-CoV-2 by FDA under an Emergency Use Authorization (EUA). This EUA will remain  in effect (meaning this test can be used) for the duration of the COVID-19 declaration under Section 564(b)(1) of the Act, 21 U.S.C.section 360bbb-3(b)(1), unless the authorization is terminated  or revoked sooner.       Influenza A by PCR NEGATIVE NEGATIVE Final   Influenza B by PCR NEGATIVE NEGATIVE Final    Comment: (NOTE) The Xpert Xpress SARS-CoV-2/FLU/RSV plus assay is intended as an aid in the diagnosis of influenza from Nasopharyngeal swab specimens and should not be used as a sole basis for treatment. Nasal washings and aspirates are unacceptable for Xpert Xpress SARS-CoV-2/FLU/RSV testing.  Fact Sheet for Patients: EntrepreneurPulse.com.au  Fact Sheet for Healthcare Providers: IncredibleEmployment.be  This test is not yet  approved or cleared by the [place] FDA and has been authorized for detection and/or diagnosis of SARS-CoV-2 by FDA under an Emergency Use Authorization (EUA). This EUA will remain in effect (meaning this test can be used) for the duration of the COVID-19 declaration under Section 564(b)(1) of the Act, 21 U.S.C. section 360bbb-3(b)(1), unless the authorization is terminated or revoked.  Performed at [facility], [place] [address] [place], [place] [address]   Blood Culture (routine x 2)     Status: Abnormal   Collection Time: 08/31/21 11:17 PM   Specimen: BLOOD  Result Value Ref Range Status   Specimen Description   Final    BLOOD RIGHT ANTECUBITAL Performed at [facility] [address] [place], [place] [address]    Special Requests   Final    BOTTLES DRAWN AEROBIC AND ANAEROBIC Blood Culture adequate volume Performed at [facility] [address] [place], [place] [address]    Culture  Setup Time   Final    GRAM NEGATIVE RODS AEROBIC BOTTLE ONLY Organism ID to follow CRITICAL RESULT CALLED TO, READ BACK BY AND VERIFIED WITHKeturah Barre [facility] PHARMD 1708 09/03/21 A BROWNING Performed at [facility], [place] [address], [place], [place] [address]    Culture PROTEUS MIRABILIS (A)  Final   Report Status 09/05/2021 FINAL  Final   Organism ID, Bacteria PROTEUS MIRABILIS  Final      Susceptibility   Proteus mirabilis - MIC*    AMPICILLIN <=2 SENSITIVE Sensitive     CEFAZOLIN 8 SENSITIVE Sensitive     CEFEPIME <=0.12 SENSITIVE Sensitive     CEFTAZIDIME <=1 SENSITIVE Sensitive     CEFTRIAXONE <=0.25 SENSITIVE Sensitive     CIPROFLOXACIN <=0.25 SENSITIVE Sensitive     GENTAMICIN <=1 SENSITIVE Sensitive     IMIPENEM  4 SENSITIVE Sensitive     TRIMETH/SULFA <=20 SENSITIVE Sensitive     AMPICILLIN/SULBACTAM <=2 SENSITIVE Sensitive     PIP/TAZO <=4 SENSITIVE Sensitive     * PROTEUS MIRABILIS  Blood Culture ID Panel  (Reflexed)     Status: Abnormal   Collection Time: 08/31/21 11:17 PM  Result Value Ref Range Status   Enterococcus faecalis NOT DETECTED NOT DETECTED Final   Enterococcus Faecium NOT DETECTED NOT DETECTED Final   Listeria monocytogenes NOT DETECTED NOT DETECTED Final   Staphylococcus species NOT DETECTED NOT DETECTED Final   Staphylococcus aureus (BCID) NOT DETECTED NOT DETECTED Final   Staphylococcus epidermidis NOT DETECTED NOT DETECTED Final   Staphylococcus lugdunensis NOT DETECTED NOT DETECTED Final   Streptococcus species NOT DETECTED NOT DETECTED Final   Streptococcus agalactiae NOT DETECTED NOT DETECTED Final   Streptococcus pneumoniae NOT DETECTED NOT DETECTED Final   Streptococcus pyogenes NOT DETECTED NOT DETECTED Final   A.calcoaceticus-baumannii NOT DETECTED NOT DETECTED Final   Bacteroides fragilis NOT DETECTED NOT DETECTED Final   Enterobacterales DETECTED (A) NOT DETECTED Final    Comment: Enterobacterales represent a large order of gram negative bacteria, not a single organism. CRITICAL RESULT CALLED TO, READ BACK BY AND VERIFIED WITH: D WOFFORD PHARMD 1708 09/03/21 A BROWNING    Enterobacter cloacae complex NOT DETECTED NOT DETECTED Final   Escherichia coli NOT DETECTED NOT DETECTED Final   Klebsiella aerogenes NOT DETECTED NOT DETECTED Final   Klebsiella oxytoca NOT DETECTED NOT DETECTED Final   Klebsiella pneumoniae NOT DETECTED NOT DETECTED Final   Proteus species DETECTED (A) NOT DETECTED Final    Comment: CRITICAL RESULT CALLED TO, READ BACK BY AND VERIFIED WITH: D WOFFORD PHARMD 1708 09/03/21 A BROWNING    Salmonella species NOT DETECTED NOT DETECTED Final   Serratia marcescens NOT DETECTED NOT DETECTED Final   Haemophilus influenzae NOT DETECTED NOT DETECTED Final   Neisseria meningitidis NOT DETECTED NOT DETECTED Final   Pseudomonas aeruginosa NOT DETECTED NOT DETECTED Final   Stenotrophomonas maltophilia NOT DETECTED NOT DETECTED Final   Candida albicans NOT  DETECTED NOT DETECTED Final   Candida auris NOT DETECTED NOT DETECTED Final   Candida glabrata NOT DETECTED NOT DETECTED Final   Candida krusei NOT DETECTED NOT DETECTED Final   Candida parapsilosis NOT DETECTED NOT DETECTED Final   Candida tropicalis NOT DETECTED NOT DETECTED Final   Cryptococcus neoformans/gattii NOT DETECTED NOT DETECTED Final   CTX-M ESBL NOT DETECTED NOT DETECTED Final   Carbapenem resistance IMP NOT DETECTED NOT DETECTED Final   Carbapenem resistance KPC NOT DETECTED NOT DETECTED Final   Carbapenem resistance NDM NOT DETECTED NOT DETECTED Final   Carbapenem resist OXA 48 LIKE NOT DETECTED NOT DETECTED Final   Carbapenem resistance VIM NOT DETECTED NOT DETECTED Final    Comment: Performed at [facility], [address], [place], [place] [address]  MRSA Next Gen by PCR, Nasal     Status: None   Collection Time: 09/01/21  5:45 AM   Specimen: Nasal Mucosa; Nasal Swab  Result Value Ref Range Status   MRSA by PCR Next Gen NOT DETECTED NOT DETECTED Final    Comment: (NOTE) The GeneXpert MRSA Assay (FDA approved for NASAL specimens only), is one component of a comprehensive MRSA colonization surveillance program. It is not intended to diagnose MRSA infection nor to guide or monitor treatment for MRSA infections. Test performance is not FDA approved in patients less than 22 years old. Performed at [facility],  [place] [address], [place], [place] [address]   Blood Culture (routine x 2)     Status: None (Preliminary result)   Collection Time: 09/01/21  5:47 AM   Specimen: BLOOD  Result Value Ref Range Status   Specimen Description   Final    BLOOD BLOOD LEFT FOREARM Performed at [facility] [address] [place], [place] [address]    Special Requests   Final    BOTTLES DRAWN AEROBIC AND ANAEROBIC Blood Culture adequate volume Performed at [facility] [address], [place], [place] [address]     Culture   Final    NO GROWTH 4 DAYS Performed at [facility], [place] [address] [place], [place] [address]    Report Status PENDING  Incomplete  Urine Culture     Status: None   Collection Time: 09/01/21  7:53 AM   Specimen: Urine, Catheterized  Result Value Ref Range Status   Specimen Description   Final    URINE, CATHETERIZED Performed at [facility] [address], [place], [place] [address]    Special Requests   Final    NONE Performed at [facility], [place] [address] [place], [place] [address]    Culture   Final    NO GROWTH Performed at [facility], [place] [address] [place], [place] [address]    Report Status 09/02/2021 FINAL  Final  SARS CORONAVIRUS 2 (TAT 6-24 HRS) Nasopharyngeal Nasopharyngeal Swab     Status: None   Collection Time: 09/04/21  3:41 PM   Specimen: Nasopharyngeal Swab  Result Value Ref Range Status   SARS Coronavirus 2 NEGATIVE NEGATIVE Final    Comment: (NOTE) SARS-CoV-2 target nucleic acids are NOT DETECTED.  The SARS-CoV-2 RNA is generally detectable in upper and lower respiratory specimens during the acute phase of infection. Negative results do not preclude SARS-CoV-2 infection, do not rule out co-infections with other pathogens, and should not be used as the sole basis for treatment or other patient management decisions. Negative results must be combined with clinical observations, patient history, and epidemiological information. The expected result is Negative.  Fact Sheet for Patients: SugarRoll.be  Fact Sheet for Healthcare Providers: https://www.woods-mathews.com/  This test is not yet approved or cleared by the [place] FDA and  has been authorized for detection and/or diagnosis of SARS-CoV-2 by FDA under an Emergency Use Authorization (EUA). This EUA will remain  in effect (meaning this test can be used) for the duration of the COVID-19  declaration under Se ction 564(b)(1) of the Act, 21 U.S.C. section 360bbb-3(b)(1), unless the authorization is terminated or revoked sooner.  Performed at [facility], [place] [address], [place], [place] [address]     Studies/Results: DG Abd 1 View  Result Date: 09/05/2021 CLINICAL DATA:  Abdominal distension. EXAM: ABDOMEN - 1 VIEW COMPARISON:  CT scan 09/01/2021 FINDINGS: Moderate gaseous distention of bowel is noted suggesting a diffuse ileus. Contrast from the CT scan is in the distal sigmoid colon rectum. IMPRESSION: Moderate gaseous distention of bowel suggesting a diffuse ileus. Recommend correlation with bowel sounds. Electronically Signed   By: Marijo Sanes M.D.   On: 09/05/2021 08:13      Assessment/Plan:  INTERVAL HISTORY: Proteus has returned sensitive to all antibiotics against which it was tested   Active Problems:   Blood loss anemia   Acute encephalopathy   GI bleed   Hypoalbuminemia   Hypotension   Low bicarbonate  Sepsis ([place])    Anthony Hayes is a 86 y.o. male with severe dementia hyperlipidemia hypertension hypothyroidism renal disease esophageal tear who was admitted after he had been choking and having hematochezia with have hematemesis.  While he was vomiting his forehead sutures dehisced as well.  This was from a recent fall where he sustained laceration to his forehead and bruise to his left orbit.  In the ER is found to be febrile to 101.1.  Growing Proteus in 1 of 2 blood cultures and had been on cefepime which we changed to ceftriaxone.  He had a CT of the abdomen pelvis performed that showed no evidence of intra-abdominal infection.  Urine cultures were also sterile.  His Proteus has returned and is sensitive to all antibiotics against which was tested.  Proteus bolus bacteremia: He can be changed over to amoxicillin to complete a total of 7 days of antibiotic therapy including his IV therapy previously received.  I spent 37 minutes with the  patient including personally reviewing radiographs, along with pertinent  CT scans, CBC BMP, updated cultures along with review of the chart before and during the visit and in coordination of his care with Dr. Zigmund Daniel.   LOS: 4 days   Alcide Evener 09/05/2021, 2:12 PM

## 2021-09-05 NOTE — NC FL2 (Signed)
[place] LEVEL OF CARE SCREENING TOOL     IDENTIFICATION  Patient Name: Anthony Hayes Birthdate: 04/06/1935 Sex: male Admission Date (Current Location): 08/31/2021  [facility] and [place] Number:  Herbalist and Address:  [facility],  [address], [place]      Provider Number: [id number]  Attending Physician Name and Address:  Georgette Shell, MD  Relative Name and Phone Number:  [name] (Daughter)   [phone]    Current Level of Care: Hospital Recommended Level of Care: [place], Memory Care Prior Approval Number:    Date Approved/Denied:   PASRR Number:    Discharge Plan: Domiciliary (Rest home) [facility])    Current Diagnoses: Patient Active Problem List   Diagnosis Date Noted   Dysphagia    Bacterial infection due to Proteus mirabilis    Gram-negative bacteremia    Laceration of forehead    Sepsis ([place])    GI bleed 09/01/2021   Hypoalbuminemia 09/01/2021   Hypotension 09/01/2021   Low bicarbonate 09/01/2021   Altered mental status 01/15/2020   Pneumonia due to COVID-19 virus 01/14/2020   COVID-19 virus infection 12/19/2019   Protein-calorie malnutrition, severe ([place]) 12/19/2019   HCAP (healthcare-associated pneumonia) 11/22/2018   Fall 11/22/2018   Chronic diastolic CHF (congestive heart failure) ([place]) 11/22/2018   Hypoxia    Bradycardia 10/28/2018   Leg swelling 10/28/2018   Irregular heart beat 09/08/2018   Sleep disturbance    Stage 3 chronic kidney disease ([place])    Benign essential HTN    Hypokalemia    Hyponatremia    Infarction of right basal ganglia ([place]) 04/06/2018   Pressure injury of skin 04/06/2018   Vitamin B12 deficiency    Hypothyroid    Crohn's disease with complication (HCC)    Benign prostatic hyperplasia    Acute lower UTI    Dementia without behavioral disturbance (HCC)    Acute ischemic stroke ([place]) 04/05/2018   Leukocytosis    Shortness of breath     Acute encephalopathy    CVA (cerebral vascular accident) ([place]) 04/03/2018   Calculus, kidney 01/21/2018   Blood loss anemia    Mallory-Weiss syndrome    Esophageal stricture    Respiratory failure (HCC)    UGIB (upper gastrointestinal bleed) 10/16/2017   GERD (gastroesophageal reflux disease) 10/16/2017   Essential hypertension, benign 10/16/2017   Chest pain 10/15/2017   Hypothyroidism (acquired) 06/08/2017   Chronic kidney disease, stage III (moderate) 03/19/2016   Dyslipidemia 03/19/2016   Degeneration of lumbar or lumbosacral intervertebral disc 07/10/2008   Arthropathy, lower leg 05/23/2004    Orientation RESPIRATION BLADDER Height & Weight     Self  Normal Incontinent Weight: 60.4 kg Height:  5' 6"  (167.6 cm)  BEHAVIORAL SYMPTOMS/MOOD NEUROLOGICAL BOWEL NUTRITION STATUS      Incontinent Diet  AMBULATORY STATUS COMMUNICATION OF NEEDS Skin   Total Care Verbally Other (Comment) (closed incision, Head)                       Personal Care Assistance Level of Assistance  Total care           Functional Limitations Info  Sight, Hearing, Speech Sight Info: Adequate Hearing Info: Impaired Speech Info: Adequate    SPECIAL CARE FACTORS FREQUENCY   (Hospice Care)                    Contractures Contractures Info: Not present    Additional  Factors Info  Code Status Code Status Info: DNR             Discharge Medications: acetaminophen 325 MG tablet Commonly known as: TYLENOL Take 2 tablets (650 mg total) by mouth every 6 (six) hours as needed for mild pain (or Fever >/= 101).          Icon medications to start taking   amoxicillin 500 MG capsule Commonly known as: AMOXIL Take 1 capsule (500 mg total) by mouth 3 (three) times daily.          * BAZA PROTECT EX Apply 1 application topically See admin instructions. Apply to creases of buttocks every 12 hours          * dimethicone-zinc oxide cream Apply 1 application topically every 12 (twelve)  hours. Apply to buttocks for redness         Icon medications to change how you take   NIKE Essentials Pack Take 1 Package by mouth 2 (two) times daily with a meal. Mix with milk What changed: Another medication with the same name was removed. Continue taking this medication, and follow the directions you see here.          cyanocobalamin 1000 MCG/ML injection Commonly known as: (VITAMIN B-12) Inject 1,000 mcg into the muscle every 30 (thirty) days.          levothyroxine 50 MCG tablet Commonly known as: SYNTHROID Take 50 mcg by mouth daily before breakfast.         Icon medications to change how you take   loperamide 2 MG tablet Commonly known as: IMODIUM A-D Take 2-4 mg by mouth See admin instructions. Take 4 mg by mouth after the 1st loose stool and 2 mg after each subsequent loose stool (max 6 doses/24 hours) What changed: Another medication with the same name was removed. Continue taking this medication, and follow the directions you see here.         Icon medications to change how you take   LORazepam 0.5 MG tablet Commonly known as: ATIVAN Take 0.25 mg by mouth every 12 (twelve) hours as needed for anxiety (or agitation). What changed: Another medication with the same name was removed. Continue taking this medication, and follow the directions you see here.         Icon medications to change how you take   Melatonin 3 MG Subl Place 6 mg under the tongue at bedtime. What changed: Another medication with the same name was removed. Continue taking this medication, and follow the directions you see here.         Icon medications to start taking   ondansetron 4 MG tablet Commonly known as: ZOFRAN Take 1 tablet (4 mg total) by mouth every 6 (six) hours as needed for nausea.         Icon medications to start taking   polyethylene glycol 17 g packet Commonly known as: MIRALAX / GLYCOLAX Start taking on: September 06, 2021 Take 17 g by mouth daily.         Icon medications to  change how you take   Pro-Stat Liqd Take 30 mLs by mouth daily at 8 pm. What changed: Another medication with the same name was removed. Continue taking this medication, and follow the directions you see here.         Icon medications to change how you take   prochlorperazine 10 MG tablet Commonly known as: COMPAZINE Take 10 mg by mouth every 4 (  four) hours as needed for nausea or vomiting. What changed: Another medication with the same name was removed. Continue taking this medication, and follow the directions you see here.         Icon medications to change how you take   Remedy Phytoplex Antifungal 2 % powder Apply 1 application topically 3 (three) times a week. Mon, Wed, Friday What changed: Another medication with the same name was removed. Continue taking this medication, and follow the directions you see here. Generic drug: miconazole         Icon medications to change how you take   sertraline 100 MG tablet Commonly known as: ZOLOFT Take 100 mg by mouth daily. Take with Sertraline 50 mg to equal 150 mg What changed: Another medication with the same name was removed. Continue taking this medication, and follow the directions you see here.            Relevant Imaging Results:  Relevant Lab Results:   Additional Information SSN [place], [place]

## 2021-09-05 NOTE — Discharge Summary (Signed)
Physician Discharge Summary  Anthony Hayes VZC:[id number] DOB: 11-Jan-1935 DOA: 08/31/2021  PCP: Etter Sjogren, DO  Admit date: 08/31/2021 Discharge date: 09/05/2021  Admitted From: Memory care Disposition: Memory care  Recommendations for Outpatient Follow-up:  Follow up with PCP in 1-2 weeks Please obtain BMP/CBC in one week   Home Health: None Equipment/Devices:None  Discharge Condition: Stable CODE STATUS: DNR Diet recommendation: Cardiac Brief/Interim Summary: 86 year old M with history of dementia on hospice, HTN, hypothyroidism, BPH, GERD, esophageal tear, and osteoarthritis brought to ED by EMS with concern for hematemesis, hematochezia and mental status change, and admitted for ABLA from acute GI bleed, SIRS and hypotension.  He was febrile with leukocytosis, hypotension and lactic acidosis.  Hgb 7.3 (baseline 13 about 3 days prior).  CXR and CT head without significant finding other than frontal and left preseptal soft tissue swelling.  CT abdomen and pelvis raises concern for cystitis.  UA with moderate Hgb, LE and rare bacteria.  Cultures obtained.  Patient was started on IV fluid and broad-spectrum antibiotics, and admitted.  Per admitting provider, family not interested in blood transfusion, ACLS meds or invasive procedures such as endoscopy or colonoscopy as of now.    Blood and urine cultures negative.  Blood pressure improved but bradycardic to 30s and 40s.  He is not symptomatic from this.  Per family, baseline heart rate is in 53s.   Discharge Diagnoses:  Active Problems:   Blood loss anemia   Acute encephalopathy   GI bleed   Hypoalbuminemia   Hypotension   Low bicarbonate   Sepsis (HCC)  #1 acute blood loss anemia due to upper GI bleed and presented with hematemesis has a history of esophageal ulcer and GERD in the past. GI consulted for possible esophageal stricture in the esophagogram.  Since patient did not have any symptoms related to the stricture EGD was not  done.  Plavix was stopped for GERD.  Plavix should not be restarted at any point.     #2 sepsis poa-1 out of 2 blood cultures positive for Proteus.  ID was consulted recommended discharge patient on Augmentin 500 mg 3 times a day for 3 more days.  Patient received Vanco cefepime and Flagyl during the hospital stay.  Sensitivities are pending. On broad-spectrum antibiotics Vanco cefepime and Flagyl. MRSA PCR negative, DC vancomycin.  upon admission he was febrile with leukocytosis lactic acidosis and hypotension and unclear source.  Cultures negative.   #3 bradycardia avoid nodal blocking agents Patient asymptomatic. EKG with sinus bradycardia no acute findings.   #4 AKI likely from hypotension/infectious process possible, CT abdomen no obstruction.   #5 acute metabolic encephalopathy likely multifactorial.  CT head with frontal soft tissue swelling from recent fall and trauma.   #6 hypothyroidism on IV Synthroid   #7 recurrent falls at the nursing home patient had a fall with head injury with 3 days prior to admission to the hospital.  Forehead laceration has been repaired with staples and stitches on 08/29/2021.   #8 goals of care patient's CODE STATUS is DNR/DNI. Family is not interested in aggressive work-up blood transfusion or ACLS medications or invasive procedures, however they are okay with proceeding with endoscopy. Will order COVID test for possible discharge in a.m.   Pressure Injury 04/05/18 Stage I -  Intact skin with non-blanchable redness of a localized area usually over a bony prominence. (Active)  04/05/18 0800  Location: Buttocks  Location Orientation:   Staging: Stage I -  Intact skin with non-blanchable redness of  a localized area usually over a bony prominence.  Wound Description (Comments):   Present on Admission:      Pressure Injury 12/20/19 Buttocks Left Stage 1 -  Intact skin with non-blanchable redness of a localized area usually over a bony prominence.  (Active)  12/20/19 0236  Location: Buttocks  Location Orientation: Left  Staging: Stage 1 -  Intact skin with non-blanchable redness of a localized area usually over a bony prominence.  Wound Description (Comments):   Present on Admission:                     Estimated body mass index is 21.49 kg/m as calculated from the following:   Height as of this encounter: 5' 6"  (1.676 m).   Weight as of this encounter: 60.4 kg.  Discharge Instructions  Discharge Instructions     Diet - low sodium heart healthy   Complete by: As directed    Discharge wound care:   Complete by: As directed    See dc instructions   Increase activity slowly   Complete by: As directed       Allergies as of 09/05/2021       Reactions   Lisinopril Swelling   Swelling of lips   Lisinopril Other (See Comments)   "Allergic," per [facility]        Medication List     STOP taking these medications    amLODipine 5 MG tablet Commonly known as: NORVASC   clopidogrel 75 MG tablet Commonly known as: PLAVIX   pantoprazole 40 MG tablet Commonly known as: PROTONIX   pravastatin 10 MG tablet Commonly known as: Pravachol       TAKE these medications    acetaminophen 325 MG tablet Commonly known as: TYLENOL Take 2 tablets (650 mg total) by mouth every 6 (six) hours as needed for mild pain (or Fever >/= 101).   amoxicillin 500 MG capsule Commonly known as: AMOXIL Take 1 capsule (500 mg total) by mouth 3 (three) times daily.   BAZA PROTECT EX Apply 1 application topically See admin instructions. Apply to creases of buttocks every 12 hours   dimethicone-zinc oxide cream Apply 1 application topically every 12 (twelve) hours. Apply to buttocks for redness   Carnation Breakfast Essentials Pack Take 1 Package by mouth 2 (two) times daily with a meal. Mix with milk What changed: Another medication with the same name was removed. Continue taking this medication, and follow the directions you  see here.   cyanocobalamin 1000 MCG/ML injection Commonly known as: (VITAMIN B-12) Inject 1,000 mcg into the muscle every 30 (thirty) days.   levothyroxine 50 MCG tablet Commonly known as: SYNTHROID Take 50 mcg by mouth daily before breakfast.   loperamide 2 MG tablet Commonly known as: IMODIUM A-D Take 2-4 mg by mouth See admin instructions. Take 4 mg by mouth after the 1st loose stool and 2 mg after each subsequent loose stool (max 6 doses/24 hours) What changed: Another medication with the same name was removed. Continue taking this medication, and follow the directions you see here.   LORazepam 0.5 MG tablet Commonly known as: ATIVAN Take 0.25 mg by mouth every 12 (twelve) hours as needed for anxiety (or agitation). What changed: Another medication with the same name was removed. Continue taking this medication, and follow the directions you see here.   Melatonin 3 MG Subl Place 6 mg under the tongue at bedtime. What changed: Another medication with the same name was removed. Continue  taking this medication, and follow the directions you see here.   ondansetron 4 MG tablet Commonly known as: ZOFRAN Take 1 tablet (4 mg total) by mouth every 6 (six) hours as needed for nausea.   polyethylene glycol 17 g packet Commonly known as: MIRALAX / GLYCOLAX Take 17 g by mouth daily. Start taking on: September 06, 2021   Pro-Stat Liqd Take 30 mLs by mouth daily at 8 pm. What changed: Another medication with the same name was removed. Continue taking this medication, and follow the directions you see here.   prochlorperazine 10 MG tablet Commonly known as: COMPAZINE Take 10 mg by mouth every 4 (four) hours as needed for nausea or vomiting. What changed: Another medication with the same name was removed. Continue taking this medication, and follow the directions you see here.   Remedy Phytoplex Antifungal 2 % powder Generic drug: miconazole Apply 1 application topically 3 (three)  times a week. Mon, Wed, Friday What changed: Another medication with the same name was removed. Continue taking this medication, and follow the directions you see here.   sertraline 100 MG tablet Commonly known as: ZOLOFT Take 100 mg by mouth daily. Take with Sertraline 50 mg to equal 150 mg What changed: Another medication with the same name was removed. Continue taking this medication, and follow the directions you see here.               Discharge Care Instructions  (From admission, onward)           Start     Ordered   09/05/21 0000  Discharge wound care:       Comments: See dc instructions   09/05/21 1241            Follow-up Information     Etter Sjogren, DO Follow up.   Specialty: Family Medicine Contact information: [place] [address] [phone]         Minus Breeding, MD .   Specialty: Cardiology Contact information: [address] [place] [address] [phone]                Allergies  Allergen Reactions   Lisinopril Swelling    Swelling of lips    Lisinopril Other (See Comments)    "Allergic," per [facility]    Consultations: None   Procedures/Studies: DG Chest 1 View  Result Date: 08/29/2021 CLINICAL DATA:  Fall EXAM: CHEST  1 VIEW COMPARISON:  02/28/2020 FINDINGS: Linear atelectasis or scar left base. No consolidation or effusion. Cardiomegaly with aortic atherosclerosis. No pneumothorax. IMPRESSION: No active disease.  Cardiomegaly Electronically Signed   By: Donavan Foil M.D.   On: 08/29/2021 19:49   DG Pelvis 1-2 Views  Result Date: 08/29/2021 CLINICAL DATA:  Fall syncope EXAM: PELVIS - 1-2 VIEW COMPARISON:  09/17/2018 FINDINGS: SI joints are non widened. Pubic symphysis and rami appear intact. No fracture or malalignment. Surgical hardware in the right femur incompletely visualized. IMPRESSION: No acute osseous abnormality Electronically Signed   By: Donavan Foil M.D.   On: 08/29/2021 19:50   DG Forearm Left  Result Date: 08/31/2021 CLINICAL DATA:  Left arm bruising, dementia EXAM: LEFT FOREARM - 2 VIEW COMPARISON:  None. FINDINGS: There is no evidence of fracture or other focal bone lesions. Soft tissues are unremarkable. IMPRESSION: Negative. Electronically Signed   By: Fidela Salisbury M.D.   On: 08/31/2021 20:41   DG Abd 1 View  Result Date:  09/05/2021 CLINICAL DATA:  Abdominal distension. EXAM: ABDOMEN - 1 VIEW COMPARISON:  CT scan 09/01/2021 FINDINGS: Moderate gaseous distention of bowel is noted suggesting a diffuse ileus. Contrast from the CT scan is in the distal sigmoid colon rectum. IMPRESSION: Moderate gaseous distention of bowel suggesting a diffuse ileus. Recommend correlation with bowel sounds. Electronically Signed   By: Marijo Sanes M.D.   On: 09/05/2021 08:13   CT HEAD WO CONTRAST (5MM)  Result Date: 08/31/2021 CLINICAL DATA:  Mental status change, unknown cause. Left forehead injury, scalp soft tissue swelling, dementia. EXAM: CT HEAD WITHOUT CONTRAST TECHNIQUE: Contiguous axial images were obtained from the base of the skull through the vertex without intravenous contrast. COMPARISON:  08/29/2021, 11/22/2018 FINDINGS: Brain: Normal anatomic configuration of the brain. Moderate parenchymal volume loss is commensurate with the patient's age and stable since immediate prior examination, though appears slightly progressive since remote prior exam. Extensive bilateral subcortical and periventricular white matter changes are again identified likely reflecting the sequela of small vessel ischemia. Mild ventriculomegaly is stable, commensurate with the degree of parenchymal volume loss and likely reflecting the sequela of central atrophy. Multiple remote lacunar infarcts are noted within the basal ganglia bilaterally, the thalami bilaterally, and left cerebellar hemisphere. No acute intracranial hemorrhage or infarct. No abnormal mass effect or  midline shift. No abnormal intra or extra-axial mass lesion. Vascular: No asymmetric hyperdense vasculature at the skull base. Skull: Intact Sinuses/Orbits: Paranasal sinuses are clear. Orbits are unremarkable. Other: Mastoid air cells and middle ear cavities are clear. There is increasing frontal scalp soft tissue swelling as well as left preseptal soft tissue swelling. Skin staples are seen anteriorly. IMPRESSION: Progressive frontal and left preseptal soft tissue swelling. No acute intracranial injury. No calvarial fracture. Advanced senescent change, progressive since remote prior examination. Multiple bilateral remote lacunar infarcts as outlined above. Electronically Signed   By: Fidela Salisbury M.D.   On: 08/31/2021 22:24   CT HEAD WO CONTRAST (5MM)  Result Date: 08/29/2021 CLINICAL DATA:  Head/neck trauma EXAM: CT HEAD WITHOUT CONTRAST CT CERVICAL SPINE WITHOUT CONTRAST TECHNIQUE: Multidetector CT imaging of the head and cervical spine was performed following the standard protocol without intravenous contrast. Multiplanar CT image reconstructions of the cervical spine were also generated. COMPARISON:  CT head dated 11/22/2018. CT head/cervical spine dated 11/02/2018. FINDINGS: CT HEAD FINDINGS Brain: No evidence of acute infarction, hemorrhage, hydrocephalus, extra-axial collection or mass lesion/mass effect. Global cortical and central atrophy.  Secondary ventriculomegaly. Subcortical white matter and periventricular small vessel ischemic changes. Vascular: Intracranial atherosclerosis. Skull: Normal. Negative for fracture or focal lesion. Sinuses/Orbits: The visualized paranasal sinuses are essentially clear. The mastoid air cells are unopacified. Other: Moderate extracranial hematoma overlying the left frontal bone (series 5/image 18). CT CERVICAL SPINE FINDINGS Alignment: Normal cervical lordosis. Skull base and vertebrae: No acute fracture. No primary bone lesion or focal pathologic process. Soft  tissues and spinal canal: No prevertebral fluid or swelling. No visible canal hematoma. Disc levels: Mild multilevel degenerative changes. Spinal canal is patent. Upper chest: Visualized lung apices are clear. Other: Visualized thyroid is unremarkable. IMPRESSION: Moderate extracranial hematoma overlying the left frontal bone. No evidence of calvarial fracture. No evidence of acute intracranial abnormality. Atrophy with small vessel ischemic changes. No evidence of traumatic injury to the cervical spine. Mild multilevel degenerative changes. Electronically Signed   By: Julian Hy M.D.   On: 08/29/2021 19:48   CT Cervical Spine Wo Contrast  Result Date: 08/29/2021 CLINICAL DATA:  Head/neck trauma EXAM: CT HEAD  WITHOUT CONTRAST CT CERVICAL SPINE WITHOUT CONTRAST TECHNIQUE: Multidetector CT imaging of the head and cervical spine was performed following the standard protocol without intravenous contrast. Multiplanar CT image reconstructions of the cervical spine were also generated. COMPARISON:  CT head dated 11/22/2018. CT head/cervical spine dated 11/02/2018. FINDINGS: CT HEAD FINDINGS Brain: No evidence of acute infarction, hemorrhage, hydrocephalus, extra-axial collection or mass lesion/mass effect. Global cortical and central atrophy.  Secondary ventriculomegaly. Subcortical white matter and periventricular small vessel ischemic changes. Vascular: Intracranial atherosclerosis. Skull: Normal. Negative for fracture or focal lesion. Sinuses/Orbits: The visualized paranasal sinuses are essentially clear. The mastoid air cells are unopacified. Other: Moderate extracranial hematoma overlying the left frontal bone (series 5/image 18). CT CERVICAL SPINE FINDINGS Alignment: Normal cervical lordosis. Skull base and vertebrae: No acute fracture. No primary bone lesion or focal pathologic process. Soft tissues and spinal canal: No prevertebral fluid or swelling. No visible canal hematoma. Disc levels: Mild multilevel  degenerative changes. Spinal canal is patent. Upper chest: Visualized lung apices are clear. Other: Visualized thyroid is unremarkable. IMPRESSION: Moderate extracranial hematoma overlying the left frontal bone. No evidence of calvarial fracture. No evidence of acute intracranial abnormality. Atrophy with small vessel ischemic changes. No evidence of traumatic injury to the cervical spine. Mild multilevel degenerative changes. Electronically Signed   By: Julian Hy M.D.   On: 08/29/2021 19:48   DG Chest Port 1 View  Result Date: 08/31/2021 CLINICAL DATA:  Sepsis, altered mental status, hematemesis EXAM: PORTABLE CHEST 1 VIEW COMPARISON:  08/29/2021 FINDINGS: Lungs are clear. No pneumothorax or pleural effusion. Mild cardiomegaly is stable. Pulmonary vascularity is normal. No acute bone abnormality. IMPRESSION: No active disease.  Stable cardiomegaly. Electronically Signed   By: Fidela Salisbury M.D.   On: 08/31/2021 22:18   CT CHEST ABDOMEN PELVIS WO CONTRAST  Result Date: 09/01/2021 CLINICAL DATA:  Bloody emesis and diarrhea. EXAM: CT CHEST, ABDOMEN AND PELVIS WITHOUT CONTRAST TECHNIQUE: Multidetector CT imaging of the chest, abdomen and pelvis was performed following the standard protocol without IV contrast. COMPARISON:  Chest CT 01/14/2020 FINDINGS: CT CHEST FINDINGS Cardiovascular: Normal heart size. No pericardial effusion. Aortic and coronary atherosclerosis. Mediastinum/Nodes: Negative for hematoma or pneumomediastinum. Small sliding hiatal hernia. No esophageal thickening or periesophageal inflammation seen. Lungs/Pleura: Dependent atelectasis. No focal opacity to suggest pneumonia or alveolar hemorrhage. Musculoskeletal: No chest wall mass or suspicious bone lesions identified. CT ABDOMEN PELVIS FINDINGS Hepatobiliary: No hepatic injury or perihepatic hematoma. Hazy high-density in the gallbladder without discrete calcified stone, usually sludge. Pancreas: No acute finding. Punctate  calcification at the pancreatic head with no CBD dilatation or biliary labs derangement, best attributed to parenchymal calcification next to the CBD. Spleen: Normal in size without focal abnormality. Adrenals/Urinary Tract: Negative adrenals. Small high-density nodule in the upper pole right kidney consistent with hemorrhagic cyst. Thick walled bladder with perivesicular fat stranding Stomach/Bowel: Appendectomy. No fluid distended stomach, small-bowel obstruction, or inflammatory bowel wall thickening. No distal colonic fluid levels to correlate with diarrhea history. Vascular/Lymphatic: Atheromatous plaque, expected. Reproductive: Suspect prior trans urethral prostate resection Other: No ascites or pneumoperitoneum. 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 IMPRESSION: 1. No visible source of GI bleeding. 2. Thick walled bladder with perivesicular stranding, please correlate for cystitis. 3. Chronic/senescent changes described above. Electronically Signed   By: Monte Fantasia M.D.   On: 09/01/2021 04:15   DG ESOPHAGUS W SINGLE CM (SOL OR THIN BA)  Result Date: 09/02/2021 CLINICAL DATA:  Dysphagia.  Hematemesis. EXAM: ESOPHOGRAM/BARIUM SWALLOW  TECHNIQUE: Single contrast examination was performed using thin barium or water soluble. FLUOROSCOPY TIME:  Fluoroscopy Time:  1 minute, 30 seconds Radiation Exposure Index (if provided by the fluoroscopic device): 35.8 mGy Number of Acquired Spot Images: 4 COMPARISON:  CT chest/abdomen/pelvis 09/01/2021. FINDINGS: Evaluation limited by the patient's inability to stand and limited ability to reposition. There is a focus of apparent narrowing within the distal esophagus which appears mild to, at most, moderate in severity. This was not appreciated during the examination, but was identified on post-procedure review of the esophagram images. The esophagus is otherwise normal in caliber. Mild intermittent esophageal  dysmotility with tertiary contractions. Small sliding hiatal hernia. No gastroesophageal reflux was observed. No extraluminal contrast is demonstrated to suggest esophageal perforation/leak. IMPRESSION: Limited examination, as described. Problem-oriented water-soluble esophagram demonstrating no evidence of esophageal perforation/leak. Focus of apparent mild to, at most, moderate narrowing within the distal esophagus, which could reflect a focal stricture. Endoscopy may be considered for further evaluation, if clinically appropriate. The esophagus is otherwise normal in caliber. Mild intermittent esophageal dysmotility with tertiary contractions. Small sliding hiatal hernia. Electronically Signed   By: Kellie Simmering D.O.   On: 09/02/2021 15:40   (Echo, Carotid, EGD, Colonoscopy, ERCP)    Subjective: Sitting in bed in no acute distress no new complaints  Discharge Exam: Vitals:   09/04/21 2133 09/05/21 0511  BP: 132/69 (!) 129/56  Pulse: (!) 44 (!) 42  Resp: 20 19  Temp: 98.4 F (36.9 C) 97.8 F (36.6 C)  SpO2: 97% 95%   Vitals:   09/04/21 0525 09/04/21 2131 09/04/21 2133 09/05/21 0511  BP: 132/65 132/72 132/69 (!) 129/56  Pulse: 87 (!) 44 (!) 44 (!) 42  Resp: 18 20 20 19   Temp: 97.9 F (36.6 C) 98.4 F (36.9 C) 98.4 F (36.9 C) 97.8 F (36.6 C)  TempSrc: Axillary     SpO2: 94% 97% 97% 95%  Weight:      Height:        General: Pt is alert, awake, not in acute distress Cardiovascular: RRR, S1/S2 +, no rubs, no gallops Respiratory: CTA bilaterally, no wheezing, no rhonchi Abdominal: Soft, NT, ND, bowel sounds + Extremities: no edema, no cyanosis    The results of significant diagnostics from this hospitalization (including imaging, microbiology, ancillary and laboratory) are listed below for reference.     Microbiology: Recent Results (from the past 240 hour(s))  Culture, blood (single)     Status: None (Preliminary result)   Collection Time: 08/31/21  9:31 PM   Specimen:  BLOOD  Result Value Ref Range Status   Specimen Description   Final    BLOOD BLOOD RIGHT FOREARM Performed at [facility] [address], [place], [place] [address]    Special Requests   Final    BOTTLES DRAWN AEROBIC AND ANAEROBIC Blood Culture adequate volume Performed at [facility] [address], [place], [place] [address]    Culture   Final    NO GROWTH 4 DAYS Performed at [facility], [place] [address] [place], [place] [address]    Report Status PENDING  Incomplete  Resp Panel by RT-PCR (Flu A&B, Covid) Nasopharyngeal Swab     Status: None   Collection Time: 08/31/21  9:53 PM   Specimen: Nasopharyngeal Swab; Nasopharyngeal(NP) swabs in vial transport medium  Result Value Ref Range Status   SARS Coronavirus 2 by RT PCR NEGATIVE NEGATIVE Final    Comment: (NOTE) SARS-CoV-2 target nucleic acids  are NOT DETECTED.  The SARS-CoV-2 RNA is generally detectable in upper respiratory specimens during the acute phase of infection. The lowest concentration of SARS-CoV-2 viral copies this assay can detect is 138 copies/mL. A negative result does not preclude SARS-Cov-2 infection and should not be used as the sole basis for treatment or other patient management decisions. A negative result may occur with  improper specimen collection/handling, submission of specimen other than nasopharyngeal swab, presence of viral mutation(s) within the areas targeted by this assay, and inadequate number of viral copies(<138 copies/mL). A negative result must be combined with clinical observations, patient history, and epidemiological information. The expected result is Negative.  Fact Sheet for Patients:  EntrepreneurPulse.com.au  Fact Sheet for Healthcare Providers:  IncredibleEmployment.be  This test is no t yet approved or cleared by the [place] FDA and  has been authorized for detection and/or diagnosis of  SARS-CoV-2 by FDA under an Emergency Use Authorization (EUA). This EUA will remain  in effect (meaning this test can be used) for the duration of the COVID-19 declaration under Section 564(b)(1) of the Act, 21 U.S.C.section 360bbb-3(b)(1), unless the authorization is terminated  or revoked sooner.       Influenza A by PCR NEGATIVE NEGATIVE Final   Influenza B by PCR NEGATIVE NEGATIVE Final    Comment: (NOTE) The Xpert Xpress SARS-CoV-2/FLU/RSV plus assay is intended as an aid in the diagnosis of influenza from Nasopharyngeal swab specimens and should not be used as a sole basis for treatment. Nasal washings and aspirates are unacceptable for Xpert Xpress SARS-CoV-2/FLU/RSV testing.  Fact Sheet for Patients: EntrepreneurPulse.com.au  Fact Sheet for Healthcare Providers: IncredibleEmployment.be  This test is not yet approved or cleared by the [place] FDA and has been authorized for detection and/or diagnosis of SARS-CoV-2 by FDA under an Emergency Use Authorization (EUA). This EUA will remain in effect (meaning this test can be used) for the duration of the COVID-19 declaration under Section 564(b)(1) of the Act, 21 U.S.C. section 360bbb-3(b)(1), unless the authorization is terminated or revoked.  Performed at [facility], [place] [address], [place], [place] [address]   Blood Culture (routine x 2)     Status: Abnormal   Collection Time: 08/31/21 11:17 PM   Specimen: BLOOD  Result Value Ref Range Status   Specimen Description   Final    BLOOD RIGHT ANTECUBITAL Performed at [facility] [address], [place], [place] [address]    Special Requests   Final    BOTTLES DRAWN AEROBIC AND ANAEROBIC Blood Culture adequate volume Performed at [facility] [address] [place],  [address]    Culture  Setup Time   Final    GRAM NEGATIVE RODS AEROBIC BOTTLE ONLY Organism ID  to follow CRITICAL RESULT CALLED TO, READ BACK BY AND VERIFIED WITHKeturah Barre [facility] PHARMD 1708 09/03/21 A BROWNING Performed at [facility], [place] [address], [place], [place] [address]    Culture PROTEUS MIRABILIS (A)  Final   Report Status 09/05/2021 FINAL  Final   Organism ID, Bacteria PROTEUS MIRABILIS  Final      Susceptibility   Proteus mirabilis - MIC*    AMPICILLIN <=2 SENSITIVE Sensitive     CEFAZOLIN 8 SENSITIVE Sensitive     CEFEPIME <=0.12 SENSITIVE Sensitive     CEFTAZIDIME <=1 SENSITIVE Sensitive     CEFTRIAXONE <=0.25 SENSITIVE Sensitive     CIPROFLOXACIN <=0.25 SENSITIVE Sensitive     GENTAMICIN <=1 SENSITIVE Sensitive  IMIPENEM 4 SENSITIVE Sensitive     TRIMETH/SULFA <=20 SENSITIVE Sensitive     AMPICILLIN/SULBACTAM <=2 SENSITIVE Sensitive     PIP/TAZO <=4 SENSITIVE Sensitive     * PROTEUS MIRABILIS  Blood Culture ID Panel (Reflexed)     Status: Abnormal   Collection Time: 08/31/21 11:17 PM  Result Value Ref Range Status   Enterococcus faecalis NOT DETECTED NOT DETECTED Final   Enterococcus Faecium NOT DETECTED NOT DETECTED Final   Listeria monocytogenes NOT DETECTED NOT DETECTED Final   Staphylococcus species NOT DETECTED NOT DETECTED Final   Staphylococcus aureus (BCID) NOT DETECTED NOT DETECTED Final   Staphylococcus epidermidis NOT DETECTED NOT DETECTED Final   Staphylococcus lugdunensis NOT DETECTED NOT DETECTED Final   Streptococcus species NOT DETECTED NOT DETECTED Final   Streptococcus agalactiae NOT DETECTED NOT DETECTED Final   Streptococcus pneumoniae NOT DETECTED NOT DETECTED Final   Streptococcus pyogenes NOT DETECTED NOT DETECTED Final   A.calcoaceticus-baumannii NOT DETECTED NOT DETECTED Final   Bacteroides fragilis NOT DETECTED NOT DETECTED Final   Enterobacterales DETECTED (A) NOT DETECTED Final    Comment: Enterobacterales represent a large order of gram negative bacteria, not a single organism. CRITICAL RESULT CALLED TO, READ BACK BY AND  VERIFIED WITH: D WOFFORD PHARMD 1708 09/03/21 A BROWNING    Enterobacter cloacae complex NOT DETECTED NOT DETECTED Final   Escherichia coli NOT DETECTED NOT DETECTED Final   Klebsiella aerogenes NOT DETECTED NOT DETECTED Final   Klebsiella oxytoca NOT DETECTED NOT DETECTED Final   Klebsiella pneumoniae NOT DETECTED NOT DETECTED Final   Proteus species DETECTED (A) NOT DETECTED Final    Comment: CRITICAL RESULT CALLED TO, READ BACK BY AND VERIFIED WITH: D WOFFORD PHARMD 1708 09/03/21 A BROWNING    Salmonella species NOT DETECTED NOT DETECTED Final   Serratia marcescens NOT DETECTED NOT DETECTED Final   Haemophilus influenzae NOT DETECTED NOT DETECTED Final   Neisseria meningitidis NOT DETECTED NOT DETECTED Final   Pseudomonas aeruginosa NOT DETECTED NOT DETECTED Final   Stenotrophomonas maltophilia NOT DETECTED NOT DETECTED Final   Candida albicans NOT DETECTED NOT DETECTED Final   Candida auris NOT DETECTED NOT DETECTED Final   Candida glabrata NOT DETECTED NOT DETECTED Final   Candida krusei NOT DETECTED NOT DETECTED Final   Candida parapsilosis NOT DETECTED NOT DETECTED Final   Candida tropicalis NOT DETECTED NOT DETECTED Final   Cryptococcus neoformans/gattii NOT DETECTED NOT DETECTED Final   CTX-M ESBL NOT DETECTED NOT DETECTED Final   Carbapenem resistance IMP NOT DETECTED NOT DETECTED Final   Carbapenem resistance KPC NOT DETECTED NOT DETECTED Final   Carbapenem resistance NDM NOT DETECTED NOT DETECTED Final   Carbapenem resist OXA 48 LIKE NOT DETECTED NOT DETECTED Final   Carbapenem resistance VIM NOT DETECTED NOT DETECTED Final    Comment: Performed at [facility], [address] [place], [place] [address]  MRSA Next Gen by PCR, Nasal     Status: None   Collection Time: 09/01/21  5:45 AM   Specimen: Nasal Mucosa; Nasal Swab  Result Value Ref Range Status   MRSA by PCR Next Gen NOT DETECTED NOT DETECTED Final    Comment: (NOTE) The GeneXpert MRSA Assay (FDA approved  for NASAL specimens only), is one component of a comprehensive MRSA colonization surveillance program. It is not intended to diagnose MRSA infection nor to guide or monitor treatment for MRSA infections. Test performance is not FDA approved in patients less than 48 years old. Performed at [facility], [place] [address] [place], [place] [address]   Blood Culture (routine x 2)     Status: None (Preliminary result)   Collection Time: 09/01/21  5:47 AM   Specimen: BLOOD  Result Value Ref Range Status   Specimen Description   Final    BLOOD BLOOD LEFT FOREARM Performed at [facility] [address] [place], [place] [address]    Special Requests   Final    BOTTLES DRAWN AEROBIC AND ANAEROBIC Blood Culture adequate volume Performed at [facility] [address] [place], [place] [address]    Culture   Final    NO GROWTH 4 DAYS Performed at [facility], [place] [address] [place], [place] [address]    Report Status PENDING  Incomplete  Urine Culture     Status: None   Collection Time: 09/01/21  7:53 AM   Specimen: Urine, Catheterized  Result Value Ref Range Status   Specimen Description   Final    URINE, CATHETERIZED Performed at [facility] [address], [place], [place] [address]    Special Requests   Final    NONE Performed at [facility], [place] [address] [place], [place] [address]    Culture   Final    NO GROWTH Performed at [facility], [place] [address] [place], [place] [address]    Report Status 09/02/2021 FINAL  Final  SARS CORONAVIRUS 2 (TAT 6-24 HRS) Nasopharyngeal Nasopharyngeal Swab     Status: None   Collection Time: 09/04/21  3:41 PM   Specimen: Nasopharyngeal Swab  Result Value Ref Range Status   SARS Coronavirus 2 NEGATIVE NEGATIVE Final    Comment: (NOTE) SARS-CoV-2 target nucleic acids are NOT DETECTED.  The SARS-CoV-2 RNA is generally detectable  in upper and lower respiratory specimens during the acute phase of infection. Negative results do not preclude SARS-CoV-2 infection, do not rule out co-infections with other pathogens, and should not be used as the sole basis for treatment or other patient management decisions. Negative results must be combined with clinical observations, patient history, and epidemiological information. The expected result is Negative.  Fact Sheet for Patients: SugarRoll.be  Fact Sheet for Healthcare Providers: https://www.woods-.com/  This test is not yet approved or cleared by the [place] FDA and  has been authorized for detection and/or diagnosis of SARS-CoV-2 by FDA under an Emergency Use Authorization (EUA). This EUA will remain  in effect (meaning this test can be used) for the duration of the COVID-19 declaration under Se ction 564(b)(1) of the Act, 21 U.S.C. section 360bbb-3(b)(1), unless the authorization is terminated or revoked sooner.  Performed at [facility], [place] [address], [place], [place] [address]      Labs: BNP (last 3 results) No results for input(s): BNP in the last 8760 hours. Basic Metabolic Panel: Recent Labs  Lab 08/31/21 2300 09/01/21 0556 09/02/21 0042 09/03/21 0239 09/04/21 0505  NA 134* 138 137 135 136  K 3.6 3.7 3.8 4.1 3.3*  CL 106 109 111 109 104  CO2 16* 20* 19* 20* 20*  GLUCOSE 67* 85 114* 127* 106*  BUN 21 29* 29* 30* 17  CREATININE 0.80 1.33* 1.29* 1.05  1.11 1.09  1.06  CALCIUM 7.4* 8.6* 8.5* 8.4* 8.6*  MG  --   --  1.5* 2.1 1.6*  PHOS  --   --  3.3 2.6  --    Liver Function Tests: Recent Labs  Lab 08/29/21 1848 08/31/21 2300 09/01/21 0556 09/02/21 0042 09/03/21 0239 09/04/21 0505  AST 27 30 41  --   --  24  ALT 16 15 20   --   --  14  ALKPHOS 120 70 90  --   --  81  BILITOT 0.9 0.5 0.9  --   --  0.7  PROT 7.6 3.6* 5.7*  --   --  5.4*  ALBUMIN 3.8 1.8* 2.8* 2.6* 2.6* 2.7*    No results for input(s): LIPASE, AMYLASE in the last 168 hours. No results for input(s): AMMONIA in the last 168 hours. CBC: Recent Labs  Lab 08/29/21 1848 08/31/21 2300 09/01/21 0706 09/01/21 1300 09/02/21 0042 09/02/21 0555 09/02/21 1311 09/03/21 0239 09/04/21 0505  WBC 9.3 15.7* 28.6*  --  16.9*  --   --  18.0* 11.5*  NEUTROABS 6.6 15.0*  --   --   --   --   --   --   --   HGB 13.9 7.3* 9.6*   < > 8.9* 8.5* 9.4* 10.1* 10.7*  HCT 41.0 22.6* 27.5*   < > 26.1* 24.6* 27.8* 29.4* 30.4*  MCV 90.9 96.6 89.6  --  90.6  --   --  90.7 88.9  PLT 298 137* 163  --  121*  --   --  120* 177   < > = values in this interval not displayed.   Cardiac Enzymes: Recent Labs  Lab 08/29/21 1848 09/01/21 0415  CKTOTAL 69 317   BNP: Invalid input(s): POCBNP CBG: Recent Labs  Lab 09/04/21 0810 09/04/21 1727 09/04/21 2131 09/05/21 0810 09/05/21 1131  GLUCAP 116* 100* 107* 82 79   D-Dimer No results for input(s): DDIMER in the last 72 hours. Hgb A1c No results for input(s): HGBA1C in the last 72 hours. Lipid Profile No results for input(s): CHOL, HDL, LDLCALC, TRIG, CHOLHDL, LDLDIRECT in the last 72 hours. Thyroid function studies No results for input(s): TSH, T4TOTAL, T3FREE, THYROIDAB in the last 72 hours.  Invalid input(s): FREET3 Anemia work up No results for input(s): VITAMINB12, FOLATE, FERRITIN, TIBC, IRON, RETICCTPCT in the last 72 hours. Urinalysis    Component Value Date/Time   COLORURINE STRAW (A) 09/04/2021 1654   APPEARANCEUR HAZY (A) 09/04/2021 1654   LABSPEC 1.008 09/04/2021 1654   PHURINE 5.0 09/04/2021 1654   GLUCOSEU NEGATIVE 09/04/2021 1654   HGBUR SMALL (A) 09/04/2021 1654   BILIRUBINUR NEGATIVE 09/04/2021 1654   BILIRUBINUR NEG 01/21/2018 2022   KETONESUR NEGATIVE 09/04/2021 1654   PROTEINUR NEGATIVE 09/04/2021 1654   UROBILINOGEN 1.0 01/21/2018 2022   NITRITE NEGATIVE 09/04/2021 1654   LEUKOCYTESUR TRACE (A) 09/04/2021 1654   Sepsis Labs Invalid  input(s): PROCALCITONIN,  WBC,  LACTICIDVEN Microbiology Recent Results (from the past 240 hour(s))  Culture, blood (single)     Status: None (Preliminary result)   Collection Time: 08/31/21  9:31 PM   Specimen: BLOOD  Result Value Ref Range Status   Specimen Description   Final    BLOOD BLOOD RIGHT FOREARM Performed at [facility], [place] [address] [place], [place] [address]    Special Requests   Final    BOTTLES DRAWN AEROBIC AND ANAEROBIC Blood Culture adequate volume Performed at [facility] [address], [place], [place] [address]    Culture   Final    NO GROWTH 4 DAYS Performed at [facility], [place] [address] [place], [place] [address]    Report Status PENDING  Incomplete  Resp Panel by RT-PCR (Flu A&B, Covid) Nasopharyngeal Swab     Status: None   Collection Time: 08/31/21  9:53 PM   Specimen: Nasopharyngeal Swab; Nasopharyngeal(NP) swabs in vial transport medium  Result Value Ref Range Status   SARS Coronavirus 2 by RT PCR NEGATIVE NEGATIVE Final    Comment: (NOTE) SARS-CoV-2 target nucleic acids are NOT DETECTED.  The SARS-CoV-2 RNA is generally detectable in upper respiratory specimens during the acute phase of infection. The lowest concentration of SARS-CoV-2 viral copies this assay can detect is 138 copies/mL. A negative result does not preclude SARS-Cov-2 infection and should not be used as the sole basis for treatment or other patient management decisions. A negative result may occur with  improper specimen collection/handling, submission of specimen other than nasopharyngeal swab, presence of viral mutation(s) within the areas targeted by this assay, and inadequate number of viral copies(<138 copies/mL). A negative result must be combined with clinical observations, patient history, and epidemiological information. The expected result is Negative.  Fact Sheet for Patients:   EntrepreneurPulse.com.au  Fact Sheet for Healthcare Providers:  IncredibleEmployment.be  This test is no t yet approved or cleared by the [place] FDA and  has been authorized for detection and/or diagnosis of SARS-CoV-2 by FDA under an Emergency Use Authorization (EUA). This EUA will remain  in effect (meaning this test can be used) for the duration of the COVID-19 declaration under Section 564(b)(1) of the Act, 21 U.S.C.section 360bbb-3(b)(1), unless the authorization is terminated  or revoked sooner.       Influenza A by PCR NEGATIVE NEGATIVE Final   Influenza B by PCR NEGATIVE NEGATIVE Final    Comment: (NOTE) The Xpert Xpress SARS-CoV-2/FLU/RSV plus assay is intended as an aid in the diagnosis of influenza from Nasopharyngeal swab specimens and should not be used as a sole basis for treatment. Nasal washings and aspirates are unacceptable for Xpert Xpress SARS-CoV-2/FLU/RSV testing.  Fact Sheet for Patients: EntrepreneurPulse.com.au  Fact Sheet for Healthcare Providers: IncredibleEmployment.be  This test is not yet approved or cleared by the [place] FDA and has been authorized for detection and/or diagnosis of SARS-CoV-2 by FDA under an Emergency Use Authorization (EUA). This EUA will remain in effect (meaning this test can be used) for the duration of the COVID-19 declaration under Section 564(b)(1) of the Act, 21 U.S.C. section 360bbb-3(b)(1), unless the authorization is terminated or revoked.  Performed at [facility], [place] [address] [place], [place] [address]   Blood Culture (routine x 2)     Status: Abnormal   Collection Time: 08/31/21 11:17 PM   Specimen: BLOOD  Result Value Ref Range Status   Specimen Description   Final    BLOOD RIGHT ANTECUBITAL Performed at [facility] [address] [place], [place] [address]    Special Requests    Final    BOTTLES DRAWN AEROBIC AND ANAEROBIC Blood Culture adequate volume Performed at [facility] [address] [place], [place] [address]    Culture  Setup Time   Final    GRAM NEGATIVE RODS AEROBIC BOTTLE ONLY Organism ID to follow CRITICAL RESULT CALLED TO, READ BACK BY AND VERIFIED WITHKeturah Barre [facility] PHARMD 1708 09/03/21 A BROWNING Performed at [facility], [place] [address] [place], [place] [address]    Culture PROTEUS MIRABILIS (A)  Final   Report Status 09/05/2021 FINAL  Final   Organism ID, Bacteria PROTEUS MIRABILIS  Final  Susceptibility   Proteus mirabilis - MIC*    AMPICILLIN <=2 SENSITIVE Sensitive     CEFAZOLIN 8 SENSITIVE Sensitive     CEFEPIME <=0.12 SENSITIVE Sensitive     CEFTAZIDIME <=1 SENSITIVE Sensitive     CEFTRIAXONE <=0.25 SENSITIVE Sensitive     CIPROFLOXACIN <=0.25 SENSITIVE Sensitive     GENTAMICIN <=1 SENSITIVE Sensitive     IMIPENEM 4 SENSITIVE Sensitive     TRIMETH/SULFA <=20 SENSITIVE Sensitive     AMPICILLIN/SULBACTAM <=2 SENSITIVE Sensitive     PIP/TAZO <=4 SENSITIVE Sensitive     * PROTEUS MIRABILIS  Blood Culture ID Panel (Reflexed)     Status: Abnormal   Collection Time: 08/31/21 11:17 PM  Result Value Ref Range Status   Enterococcus faecalis NOT DETECTED NOT DETECTED Final   Enterococcus Faecium NOT DETECTED NOT DETECTED Final   Listeria monocytogenes NOT DETECTED NOT DETECTED Final   Staphylococcus species NOT DETECTED NOT DETECTED Final   Staphylococcus aureus (BCID) NOT DETECTED NOT DETECTED Final   Staphylococcus epidermidis NOT DETECTED NOT DETECTED Final   Staphylococcus lugdunensis NOT DETECTED NOT DETECTED Final   Streptococcus species NOT DETECTED NOT DETECTED Final   Streptococcus agalactiae NOT DETECTED NOT DETECTED Final   Streptococcus pneumoniae NOT DETECTED NOT DETECTED Final   Streptococcus pyogenes NOT DETECTED NOT DETECTED Final   A.calcoaceticus-baumannii NOT DETECTED NOT DETECTED Final    Bacteroides fragilis NOT DETECTED NOT DETECTED Final   Enterobacterales DETECTED (A) NOT DETECTED Final    Comment: Enterobacterales represent a large order of gram negative bacteria, not a single organism. CRITICAL RESULT CALLED TO, READ BACK BY AND VERIFIED WITH: D WOFFORD PHARMD 1708 09/03/21 A BROWNING    Enterobacter cloacae complex NOT DETECTED NOT DETECTED Final   Escherichia coli NOT DETECTED NOT DETECTED Final   Klebsiella aerogenes NOT DETECTED NOT DETECTED Final   Klebsiella oxytoca NOT DETECTED NOT DETECTED Final   Klebsiella pneumoniae NOT DETECTED NOT DETECTED Final   Proteus species DETECTED (A) NOT DETECTED Final    Comment: CRITICAL RESULT CALLED TO, READ BACK BY AND VERIFIED WITH: D WOFFORD PHARMD 1708 09/03/21 A BROWNING    Salmonella species NOT DETECTED NOT DETECTED Final   Serratia marcescens NOT DETECTED NOT DETECTED Final   Haemophilus influenzae NOT DETECTED NOT DETECTED Final   Neisseria meningitidis NOT DETECTED NOT DETECTED Final   Pseudomonas aeruginosa NOT DETECTED NOT DETECTED Final   Stenotrophomonas maltophilia NOT DETECTED NOT DETECTED Final   Candida albicans NOT DETECTED NOT DETECTED Final   Candida auris NOT DETECTED NOT DETECTED Final   Candida glabrata NOT DETECTED NOT DETECTED Final   Candida krusei NOT DETECTED NOT DETECTED Final   Candida parapsilosis NOT DETECTED NOT DETECTED Final   Candida tropicalis NOT DETECTED NOT DETECTED Final   Cryptococcus neoformans/gattii NOT DETECTED NOT DETECTED Final   CTX-M ESBL NOT DETECTED NOT DETECTED Final   Carbapenem resistance IMP NOT DETECTED NOT DETECTED Final   Carbapenem resistance KPC NOT DETECTED NOT DETECTED Final   Carbapenem resistance NDM NOT DETECTED NOT DETECTED Final   Carbapenem resist OXA 48 LIKE NOT DETECTED NOT DETECTED Final   Carbapenem resistance VIM NOT DETECTED NOT DETECTED Final    Comment: Performed at [facility], [address], [place], [place] [address]  MRSA Next Gen  by PCR, Nasal     Status: None   Collection Time: 09/01/21  5:45 AM   Specimen: Nasal Mucosa; Nasal Swab  Result Value Ref Range Status   MRSA by PCR Next  Gen NOT DETECTED NOT DETECTED Final    Comment: (NOTE) The GeneXpert MRSA Assay (FDA approved for NASAL specimens only), is one component of a comprehensive MRSA colonization surveillance program. It is not intended to diagnose MRSA infection nor to guide or monitor treatment for MRSA infections. Test performance is not FDA approved in patients less than 83 years old. Performed at [facility], [place] [address], [place], [place] [address]   Blood Culture (routine x 2)     Status: None (Preliminary result)   Collection Time: 09/01/21  5:47 AM   Specimen: BLOOD  Result Value Ref Range Status   Specimen Description   Final    BLOOD BLOOD LEFT FOREARM Performed at [facility] [address], [place], [place] [address]    Special Requests   Final    BOTTLES DRAWN AEROBIC AND ANAEROBIC Blood Culture adequate volume Performed at [facility] [address], [place], [place] [address]    Culture   Final    NO GROWTH 4 DAYS Performed at [facility], [place] [address] [place], [place] [address]    Report Status PENDING  Incomplete  Urine Culture     Status: None   Collection Time: 09/01/21  7:53 AM   Specimen: Urine, Catheterized  Result Value Ref Range Status   Specimen Description   Final    URINE, CATHETERIZED Performed at [facility] [address] [place], [place] [address]    Special Requests   Final    NONE Performed at [facility], [place] [address], [place], [place] [address]    Culture   Final    NO GROWTH Performed at [facility], [place] [address] [place], [place] [address]    Report Status 09/02/2021 FINAL  Final  SARS CORONAVIRUS 2 (TAT 6-24 HRS) Nasopharyngeal Nasopharyngeal Swab     Status: None    Collection Time: 09/04/21  3:41 PM   Specimen: Nasopharyngeal Swab  Result Value Ref Range Status   SARS Coronavirus 2 NEGATIVE NEGATIVE Final    Comment: (NOTE) SARS-CoV-2 target nucleic acids are NOT DETECTED.  The SARS-CoV-2 RNA is generally detectable in upper and lower respiratory specimens during the acute phase of infection. Negative results do not preclude SARS-CoV-2 infection, do not rule out co-infections with other pathogens, and should not be used as the sole basis for treatment or other patient management decisions. Negative results must be combined with clinical observations, patient history, and epidemiological information. The expected result is Negative.  Fact Sheet for Patients: SugarRoll.be  Fact Sheet for Healthcare Providers: https://www.woods-[place].com/  This test is not yet approved or cleared by the [place] FDA and  has been authorized for detection and/or diagnosis of SARS-CoV-2 by FDA under an Emergency Use Authorization (EUA). This EUA will remain  in effect (meaning this test can be used) for the duration of the COVID-19 declaration under Se ction 564(b)(1) of the Act, 21 U.S.C. section 360bbb-3(b)(1), unless the authorization is terminated or revoked sooner.  Performed at [facility], [place] [address], [place], [place] [address]      Time coordinating discharge: 39 min  SIGNED:   Georgette Shell, MD  [facility] 09/05/2021, 12:41 PM

## 2021-09-05 NOTE — TOC Transition Note (Signed)
Transition of Care [facility]) - CM/SW Discharge Note   Patient Details  Name: Anthony Hayes MRN: [id number] Date of Birth: [birth year]  [facility] CM/SW Contact:  Trish Mage, LCSW Phone Number: 09/05/2021, 3:08 PM   Clinical Narrative:   Patient who is stable for discharge today will return to [facility] memory care.  Family alerted. [facility] alerted.  [facility] arranged.  Nursing, please call report to [phone]. TOC sign off.    Final next level of care: Assisted Living (Memory Care) Barriers to Discharge: Barriers Resolved   Patient Goals and CMS Choice Patient states their goals for this hospitalization and ongoing recovery are:: Return to [facility] with [facility] CMS Medicare.gov Compare Post Acute Care list provided to:: Patient Represenative (must comment) [name] (daughter)) Choice offered to / list presented to : Adult Children  Discharge Placement                       Discharge Plan and Services In-house Referral: Clinical Social Work   Post Acute Care Choice: Hospice          DME Arranged: N/A DME Agency: NA                  Social Determinants of Health (SDOH) Interventions     Readmission Risk Interventions Readmission Risk Prevention Plan 09/02/2021  Transportation Screening Complete  HRI or [place] Complete  Social Work Consult for [place] Planning/Counseling Complete  Palliative Care Screening Not Applicable  Medication Review Press photographer) Complete  Some recent data might be hidden

## 2021-09-06 LAB — CULTURE, BLOOD (SINGLE)
Culture: NO GROWTH
Special Requests: ADEQUATE

## 2021-09-06 LAB — CULTURE, BLOOD (ROUTINE X 2)
Culture: NO GROWTH
Special Requests: ADEQUATE

## 2022-03-29 ENCOUNTER — Emergency Department (HOSPITAL_BASED_OUTPATIENT_CLINIC_OR_DEPARTMENT_OTHER)

## 2022-03-29 ENCOUNTER — Other Ambulatory Visit: Payer: Self-pay

## 2022-03-29 ENCOUNTER — Emergency Department (HOSPITAL_BASED_OUTPATIENT_CLINIC_OR_DEPARTMENT_OTHER)
Admission: EM | Admit: 2022-03-29 | Discharge: 2022-03-29 | Disposition: A | Discharge status: Home / Self Care | Attending: Emergency Medicine | Admitting: Emergency Medicine

## 2022-03-29 ENCOUNTER — Encounter (HOSPITAL_BASED_OUTPATIENT_CLINIC_OR_DEPARTMENT_OTHER): Payer: Self-pay | Admitting: Obstetrics and Gynecology

## 2022-03-29 DIAGNOSIS — Z79899 Other long term (current) drug therapy: Secondary | ICD-10-CM | POA: Diagnosis not present

## 2022-03-29 DIAGNOSIS — S01419A Laceration without foreign body of unspecified cheek and temporomandibular area, initial encounter: Secondary | ICD-10-CM | POA: Diagnosis not present

## 2022-03-29 DIAGNOSIS — W01198A Fall on same level from slipping, tripping and stumbling with subsequent striking against other object, initial encounter: Secondary | ICD-10-CM | POA: Insufficient documentation

## 2022-03-29 DIAGNOSIS — W19XXXA Unspecified fall, initial encounter: Secondary | ICD-10-CM

## 2022-03-29 DIAGNOSIS — S0990XA Unspecified injury of head, initial encounter: Secondary | ICD-10-CM | POA: Diagnosis present

## 2022-03-29 DIAGNOSIS — S0181XA Laceration without foreign body of other part of head, initial encounter: Secondary | ICD-10-CM

## 2022-03-29 NOTE — ED Triage Notes (Signed)
Patient reports to the ER from [facility]. Patient had a fall today and hit his head. EMS reports patient is not on blood thinners. Patient is soiled upon arrival and alert only to self. Patient had a laceration above the left eye.  ?

## 2022-03-29 NOTE — ED Provider Notes (Signed)
?[facility] ?Provider Note ? ? ?CSN: [id number] ?Arrival date & time: 03/29/22  1446 ? ?  ? ?History ? ?Chief Complaint  ?Patient presents with  ? Fall  ? ? ?Anthony Hayes is a 87 y.o. male. ? ?Patient is a 87 year old male who presents after a fall.  He has a history of dementia so history is limited that per EMS and nursing home report, patient was transferring from a wheelchair and had a mechanical fall.  He fell forward and hit his head.  He has a laceration to his forehead.  There is no loss of consciousness.  He has had no vomiting.  He has not been reporting any other injuries.  His family is at bedside and some history is obtained from them.  He appears to be at his baseline mental status per their report.  He is oriented to person only.  He is awake and communicative.  They said that he has frequent falls.  His tetanus shot is up-to-date per their report. ? ? ?  ? ?Home Medications ?Prior to Admission medications   ?Medication Sig Start Date End Date Taking? Authorizing Provider  ?acetaminophen (TYLENOL) 325 MG tablet Take 2 tablets (650 mg total) by mouth every 6 (six) hours as needed for mild pain (or Fever >/= 101). 09/05/21   Georgette Shell, MD  ?Amino Acids-Protein Hydrolys (PRO-STAT) LIQD Take 30 mLs by mouth daily at 8 pm. 08/11/21   [provider]  ?amoxicillin (AMOXIL) 500 MG capsule Take 1 capsule (500 mg total) by mouth 3 (three) times daily. 09/05/21   Georgette Shell, MD  ?cyanocobalamin (,VITAMIN B-12,) 1000 MCG/ML injection Inject 1,000 mcg into the muscle every 30 (thirty) days.    [provider]  ?levothyroxine (SYNTHROID) 50 MCG tablet Take 50 mcg by mouth daily before breakfast.    [provider]  ?loperamide (IMODIUM A-D) 2 MG tablet Take 2-4 mg by mouth See admin instructions. Take 4 mg by mouth after the 1st loose stool and 2 mg after each subsequent loose stool (max 6 doses/24 hours)    [provider]  ?LORazepam  (ATIVAN) 0.5 MG tablet Take 0.25 mg by mouth every 12 (twelve) hours as needed for anxiety (or agitation).    [provider]  ?Melatonin 3 MG SUBL Place 6 mg under the tongue at bedtime.    [provider]  ?Nutritional Supplements (CARNATION BREAKFAST ESSENTIALS) PACK Take 1 Package by mouth 2 (two) times daily with a meal. Mix with milk    [provider]  ?ondansetron (ZOFRAN) 4 MG tablet Take 1 tablet (4 mg total) by mouth every 6 (six) hours as needed for nausea. 09/05/21   Georgette Shell, MD  ?polyethylene glycol (MIRALAX / GLYCOLAX) 17 g packet Take 17 g by mouth daily. 09/06/21   Georgette Shell, MD  ?prochlorperazine (COMPAZINE) 10 MG tablet Take 10 mg by mouth every 4 (four) hours as needed for nausea or vomiting.    [provider]  ?REMEDY PHYTOPLEX ANTIFUNGAL 2 % powder Apply 1 application topically 3 (three) times a week. Jory Sims, Friday 05/27/21   [provider]  ?sertraline (ZOLOFT) 100 MG tablet Take 100 mg by mouth daily. Take with Sertraline 50 mg to equal 150 mg 08/17/18   [provider]  ?Skin Protectants, Misc. (BAZA PROTECT EX) Apply 1 application topically See admin instructions. Apply to creases of buttocks every 12 hours    [provider]  ?Skin Protectants,  Misc. (DIMETHICONE-ZINC OXIDE) cream Apply 1 application topically every 12 (twelve) hours. Apply to buttocks for redness 06/23/21   [provider]  ?   ? ?Allergies    ?Lisinopril and Lisinopril   ? ?Review of Systems   ?Review of Systems  ?Unable to perform ROS: Dementia  ? ?Physical Exam ?Updated Vital Signs ?BP 134/71   Pulse (!) 41   Temp 97.7 ?F (36.5 ?C)   Resp 16   SpO2 98%  ?Physical Exam ?Constitutional:   ?   Appearance: He is well-developed.  ?HENT:  ?   Head: Normocephalic.  ?   Comments: 1.5 cm Y-shaped laceration to the left forehead. ?Eyes:  ?   Pupils: Pupils are equal, round, and reactive to light.  ?Neck:  ?   Comments: No pain to  the cervical, thoracic or lumbosacral spine ?Cardiovascular:  ?   Rate and Rhythm: Normal rate and regular rhythm.  ?   Heart sounds: Normal heart sounds.  ?Pulmonary:  ?   Effort: Pulmonary effort is normal. No respiratory distress.  ?   Breath sounds: Normal breath sounds. No wheezing or rales.  ?Chest:  ?   Chest wall: No tenderness.  ?Abdominal:  ?   General: Bowel sounds are normal.  ?   Palpations: Abdomen is soft.  ?   Tenderness: There is no abdominal tenderness. There is no guarding or rebound.  ?Musculoskeletal:     ?   General: Normal range of motion.  ?   Cervical back: Normal range of motion and neck supple.  ?   Comments: No pain on palpation or range of motion of the extremities  ?Lymphadenopathy:  ?   Cervical: No cervical adenopathy.  ?Skin: ?   General: Skin is warm and dry.  ?   Findings: No rash.  ?Neurological:  ?   General: No focal deficit present.  ?   Mental Status: He is alert.  ?   Comments: Oriented to person only  ? ? ?ED Results / Procedures / Treatments   ?Labs ?(all labs ordered are listed, but only abnormal results are displayed) ?Labs Reviewed - No data to display ? ?EKG ?None ? ?Radiology ?CT Head Wo Contrast ? ?Result Date: 03/29/2022 ?CLINICAL DATA:  Head trauma, minor (Age >= 65y); Neck trauma (Age >= 65y) EXAM: CT HEAD WITHOUT CONTRAST CT CERVICAL SPINE WITHOUT CONTRAST TECHNIQUE: Multidetector CT imaging of the head and cervical spine was performed following the standard protocol without intravenous contrast. Multiplanar CT image reconstructions of the cervical spine were also generated. RADIATION DOSE REDUCTION: This exam was performed according to the departmental dose-optimization program which includes automated exposure control, adjustment of the mA and/or kV according to patient size and/or use of iterative reconstruction technique. COMPARISON:  08/31/2021, 11/02/2018 FINDINGS: CT HEAD FINDINGS Brain: No evidence of acute infarction, hemorrhage, hydrocephalus, extra-axial  collection or mass lesion/mass effect. Extensive low-density changes within the periventricular and subcortical white matter compatible with chronic microvascular ischemic change. Moderate diffuse cerebral volume loss. Vascular: Atherosclerotic calcifications involving the large vessels of the skull base. No unexpected hyperdense vessel. Skull: Normal. Negative for fracture or focal lesion. Sinuses/Orbits: Minimal partial bilateral mastoid opacification. Other: Negative for scalp hematoma. CT CERVICAL SPINE FINDINGS Alignment: Facet joints are aligned without dislocation or traumatic listhesis. Dens and lateral masses are aligned. Skull base and vertebrae: No acute fracture. No primary bone lesion or focal pathologic process. Soft tissues and spinal canal: No prevertebral fluid or swelling. No visible canal hematoma. Disc levels:  Advanced multilevel degenerative disc disease and facet arthropathy throughout the cervical spine, similar to prior. Upper chest: Included lung apices are clear. Other: None. IMPRESSION: 1. No acute intracranial findings. 2. Chronic microvascular ischemic change and cerebral volume loss. 3. No acute cervical spine fracture or subluxation. 4. Advanced multilevel cervical spondylosis. Electronically Signed   By: Davina Poke D.O.   On: 03/29/2022 16:22  ? ?CT Cervical Spine Wo Contrast ? ?Result Date: 03/29/2022 ?CLINICAL DATA:  Head trauma, minor (Age >= 65y); Neck trauma (Age >= 65y) EXAM: CT HEAD WITHOUT CONTRAST CT CERVICAL SPINE WITHOUT CONTRAST TECHNIQUE: Multidetector CT imaging of the head and cervical spine was performed following the standard protocol without intravenous contrast. Multiplanar CT image reconstructions of the cervical spine were also generated. RADIATION DOSE REDUCTION: This exam was performed according to the departmental dose-optimization program which includes automated exposure control, adjustment of the mA and/or kV according to patient size and/or use of  iterative reconstruction technique. COMPARISON:  08/31/2021, 11/02/2018 FINDINGS: CT HEAD FINDINGS Brain: No evidence of acute infarction, hemorrhage, hydrocephalus, extra-axial collection or mass lesion/mass ef

## 2022-03-29 NOTE — ED Notes (Signed)
Patient transported to CT 

## 2022-03-29 NOTE — ED Notes (Signed)
Dc instructions reviewed with patient. Patient voiced understanding. Dc with belongings.  °

## 2022-03-29 NOTE — Discharge Instructions (Signed)
Keep the wound clean and dry.  Replace the dressing and apply antibiotic ointment at least once a day.  The sutures need to be removed in 5 to 7 days.  Return to the emergency room for any worsening symptoms or change in behavior. ?

## 2022-03-29 NOTE — ED Notes (Signed)
Pt received from EMS. Cleaned of incontinent stool. Soiled clothes given to family at bedside. Wound to left lateral scalp wound cleaned as well as laceration to left lateral eyebrow. LET applied to left lateral eyebrow laceration. ?

## 2022-09-27 DEATH — deceased
